# Patient Record
Sex: Female | Born: 1975
Health system: Southern US, Community
[De-identification: ages and names within clinical notes are randomized; demographics above are authoritative.]

## PROBLEM LIST (undated history)

## (undated) ENCOUNTER — Emergency Department (HOSPITAL_COMMUNITY): Discharge status: Against Medical Advice | Payer: BC Managed Care – PPO

## (undated) DIAGNOSIS — F329 Major depressive disorder, single episode, unspecified: Secondary | ICD-10-CM

## (undated) DIAGNOSIS — R87619 Unspecified abnormal cytological findings in specimens from cervix uteri: Secondary | ICD-10-CM

## (undated) DIAGNOSIS — N760 Acute vaginitis: Secondary | ICD-10-CM

## (undated) DIAGNOSIS — Z87820 Personal history of traumatic brain injury: Secondary | ICD-10-CM

## (undated) DIAGNOSIS — F988 Other specified behavioral and emotional disorders with onset usually occurring in childhood and adolescence: Secondary | ICD-10-CM

## (undated) DIAGNOSIS — M542 Cervicalgia: Secondary | ICD-10-CM

## (undated) DIAGNOSIS — N189 Chronic kidney disease, unspecified: Secondary | ICD-10-CM

## (undated) DIAGNOSIS — N200 Calculus of kidney: Secondary | ICD-10-CM

## (undated) DIAGNOSIS — J029 Acute pharyngitis, unspecified: Secondary | ICD-10-CM

## (undated) DIAGNOSIS — E039 Hypothyroidism, unspecified: Secondary | ICD-10-CM

## (undated) DIAGNOSIS — M1990 Unspecified osteoarthritis, unspecified site: Secondary | ICD-10-CM

## (undated) DIAGNOSIS — E079 Disorder of thyroid, unspecified: Secondary | ICD-10-CM

## (undated) DIAGNOSIS — E663 Overweight: Secondary | ICD-10-CM

## (undated) DIAGNOSIS — M79674 Pain in right toe(s): Secondary | ICD-10-CM

## (undated) DIAGNOSIS — R51 Headache: Secondary | ICD-10-CM

## (undated) DIAGNOSIS — E785 Hyperlipidemia, unspecified: Secondary | ICD-10-CM

## (undated) DIAGNOSIS — G4730 Sleep apnea, unspecified: Secondary | ICD-10-CM

## (undated) DIAGNOSIS — Z8669 Personal history of other diseases of the nervous system and sense organs: Secondary | ICD-10-CM

## (undated) DIAGNOSIS — T7840XA Allergy, unspecified, initial encounter: Secondary | ICD-10-CM

## (undated) DIAGNOSIS — N390 Urinary tract infection, site not specified: Secondary | ICD-10-CM

## (undated) DIAGNOSIS — Z8249 Family history of ischemic heart disease and other diseases of the circulatory system: Secondary | ICD-10-CM

## (undated) DIAGNOSIS — Z973 Presence of spectacles and contact lenses: Secondary | ICD-10-CM

## (undated) DIAGNOSIS — R6882 Decreased libido: Secondary | ICD-10-CM

## (undated) DIAGNOSIS — N3010 Interstitial cystitis (chronic) without hematuria: Secondary | ICD-10-CM

## (undated) DIAGNOSIS — R1011 Right upper quadrant pain: Principal | ICD-10-CM

## (undated) DIAGNOSIS — R591 Generalized enlarged lymph nodes: Secondary | ICD-10-CM

## (undated) DIAGNOSIS — I10 Essential (primary) hypertension: Secondary | ICD-10-CM

## (undated) DIAGNOSIS — J209 Acute bronchitis, unspecified: Secondary | ICD-10-CM

## (undated) DIAGNOSIS — Z87442 Personal history of urinary calculi: Secondary | ICD-10-CM

## (undated) DIAGNOSIS — R32 Unspecified urinary incontinence: Secondary | ICD-10-CM

## (undated) DIAGNOSIS — M797 Fibromyalgia: Secondary | ICD-10-CM

## (undated) DIAGNOSIS — K219 Gastro-esophageal reflux disease without esophagitis: Secondary | ICD-10-CM

## (undated) DIAGNOSIS — D649 Anemia, unspecified: Secondary | ICD-10-CM

## (undated) DIAGNOSIS — N761 Subacute and chronic vaginitis: Secondary | ICD-10-CM

## (undated) DIAGNOSIS — B009 Herpesviral infection, unspecified: Secondary | ICD-10-CM

## (undated) DIAGNOSIS — R29898 Other symptoms and signs involving the musculoskeletal system: Secondary | ICD-10-CM

## (undated) DIAGNOSIS — G43909 Migraine, unspecified, not intractable, without status migrainosus: Secondary | ICD-10-CM

## (undated) DIAGNOSIS — G43601 Persistent migraine aura with cerebral infarction, not intractable, with status migrainosus: Secondary | ICD-10-CM

## (undated) DIAGNOSIS — J189 Pneumonia, unspecified organism: Secondary | ICD-10-CM

## (undated) DIAGNOSIS — M25512 Pain in left shoulder: Secondary | ICD-10-CM

## (undated) DIAGNOSIS — Z0000 Encounter for general adult medical examination without abnormal findings: Secondary | ICD-10-CM

## (undated) DIAGNOSIS — I639 Cerebral infarction, unspecified: Secondary | ICD-10-CM

## (undated) DIAGNOSIS — R41840 Attention and concentration deficit: Secondary | ICD-10-CM

## (undated) DIAGNOSIS — W57XXXA Bitten or stung by nonvenomous insect and other nonvenomous arthropods, initial encounter: Secondary | ICD-10-CM

## (undated) DIAGNOSIS — N8110 Cystocele, unspecified: Secondary | ICD-10-CM

## (undated) DIAGNOSIS — B019 Varicella without complication: Secondary | ICD-10-CM

## (undated) DIAGNOSIS — R0789 Other chest pain: Secondary | ICD-10-CM

## (undated) HISTORY — DX: Personal history of other diseases of the nervous system and sense organs: Z86.69

## (undated) HISTORY — DX: Essential (primary) hypertension: I10

## (undated) HISTORY — DX: Fibromyalgia: M79.7

## (undated) HISTORY — DX: Interstitial cystitis (chronic) without hematuria: N30.10

## (undated) HISTORY — DX: Persistent migraine aura with cerebral infarction, not intractable, with status migrainosus: G43.601

## (undated) HISTORY — DX: Allergy, unspecified, initial encounter: T78.40XA

## (undated) HISTORY — DX: Cystocele, unspecified: N81.10

## (undated) HISTORY — DX: Encounter for general adult medical examination without abnormal findings: Z00.00

## (undated) HISTORY — DX: Personal history of traumatic brain injury: Z87.820

## (undated) HISTORY — DX: Subacute and chronic vaginitis: N76.1

## (undated) HISTORY — DX: Varicella without complication: B01.9

## (undated) HISTORY — DX: Sleep apnea, unspecified: G47.30

## (undated) HISTORY — DX: Calculus of kidney: N20.0

## (undated) HISTORY — DX: Chronic kidney disease, unspecified: N18.9

## (undated) HISTORY — DX: Urinary tract infection, site not specified: N39.0

## (undated) HISTORY — DX: Herpesviral infection, unspecified: B00.9

## (undated) HISTORY — DX: Major depressive disorder, single episode, unspecified: F32.9

## (undated) HISTORY — DX: Cervicalgia: M54.2

## (undated) HISTORY — DX: Other symptoms and signs involving the musculoskeletal system: R29.898

## (undated) HISTORY — DX: Headache: R51

## (undated) HISTORY — DX: Acute vaginitis: N76.0

## (undated) HISTORY — DX: Attention and concentration deficit: R41.840

## (undated) HISTORY — DX: Overweight: E66.3

## (undated) HISTORY — DX: Other chest pain: R07.89

## (undated) HISTORY — DX: Pain in right toe(s): M79.674

## (undated) HISTORY — DX: Right upper quadrant pain: R10.11

## (undated) HISTORY — DX: Generalized enlarged lymph nodes: R59.1

## (undated) HISTORY — DX: Other specified behavioral and emotional disorders with onset usually occurring in childhood and adolescence: F98.8

## (undated) HISTORY — DX: Cerebral infarction, unspecified: I63.9

## (undated) HISTORY — DX: Hyperlipidemia, unspecified: E78.5

## (undated) HISTORY — DX: Acute pharyngitis, unspecified: J02.9

## (undated) HISTORY — DX: Bitten or stung by nonvenomous insect and other nonvenomous arthropods, initial encounter: W57.XXXA

## (undated) HISTORY — DX: Pain in left shoulder: M25.512

## (undated) HISTORY — DX: Anemia, unspecified: D64.9

## (undated) HISTORY — DX: Decreased libido: R68.82

## (undated) HISTORY — DX: Gastro-esophageal reflux disease without esophagitis: K21.9

## (undated) HISTORY — DX: Unspecified abnormal cytological findings in specimens from cervix uteri: R87.619

## (undated) HISTORY — DX: Disorder of thyroid, unspecified: E07.9

## (undated) HISTORY — DX: Migraine, unspecified, not intractable, without status migrainosus: G43.909

## (undated) HISTORY — DX: Acute bronchitis, unspecified: J20.9

## (undated) HISTORY — PX: ANTERIOR AND POSTERIOR REPAIR: SHX1172

## (undated) HISTORY — DX: Family history of ischemic heart disease and other diseases of the circulatory system: Z82.49

---

## 1998-01-26 ENCOUNTER — Other Ambulatory Visit: Admission: RE | Admit: 1998-01-26 | Discharge: 1998-01-26 | Payer: Self-pay | Admitting: Gynecology

## 1998-11-01 ENCOUNTER — Other Ambulatory Visit: Admission: RE | Admit: 1998-11-01 | Discharge: 1998-11-01 | Payer: Self-pay | Admitting: Gynecology

## 1999-07-01 ENCOUNTER — Other Ambulatory Visit: Admission: RE | Admit: 1999-07-01 | Discharge: 1999-07-01 | Payer: Self-pay | Admitting: Gynecology

## 1999-12-04 ENCOUNTER — Inpatient Hospital Stay (HOSPITAL_COMMUNITY): Admission: AD | Admit: 1999-12-04 | Discharge: 1999-12-04 | Payer: Self-pay | Admitting: Gynecology

## 1999-12-23 ENCOUNTER — Inpatient Hospital Stay (HOSPITAL_COMMUNITY): Admission: AD | Admit: 1999-12-23 | Discharge: 1999-12-23 | Payer: Self-pay | Admitting: *Deleted

## 1999-12-27 ENCOUNTER — Inpatient Hospital Stay (HOSPITAL_COMMUNITY): Admission: AD | Admit: 1999-12-27 | Discharge: 1999-12-29 | Payer: Self-pay | Admitting: *Deleted

## 2000-01-14 DIAGNOSIS — Z8659 Personal history of other mental and behavioral disorders: Secondary | ICD-10-CM

## 2000-01-14 DIAGNOSIS — F32A Depression, unspecified: Secondary | ICD-10-CM

## 2000-01-14 HISTORY — DX: Depression, unspecified: F32.A

## 2000-01-14 HISTORY — DX: Personal history of other mental and behavioral disorders: Z86.59

## 2000-02-10 ENCOUNTER — Other Ambulatory Visit: Admission: RE | Admit: 2000-02-10 | Discharge: 2000-02-10 | Payer: Self-pay | Admitting: Gynecology

## 2000-02-14 ENCOUNTER — Encounter: Admission: RE | Admit: 2000-02-14 | Discharge: 2000-03-15 | Payer: Self-pay | Admitting: Gynecology

## 2000-08-27 ENCOUNTER — Encounter: Payer: Self-pay | Admitting: Urology

## 2000-08-27 ENCOUNTER — Encounter: Admission: RE | Admit: 2000-08-27 | Discharge: 2000-08-27 | Payer: Self-pay | Admitting: Urology

## 2001-01-13 HISTORY — PX: INCONTINENCE SURGERY: SHX676

## 2001-01-13 HISTORY — PX: ANTERIOR AND POSTERIOR REPAIR: SHX1172

## 2001-02-11 ENCOUNTER — Other Ambulatory Visit: Admission: RE | Admit: 2001-02-11 | Discharge: 2001-02-11 | Payer: Self-pay

## 2001-06-02 ENCOUNTER — Encounter: Payer: Self-pay | Admitting: Gynecology

## 2001-06-03 ENCOUNTER — Inpatient Hospital Stay (HOSPITAL_COMMUNITY): Admission: RE | Admit: 2001-06-03 | Discharge: 2001-06-04 | Payer: Self-pay | Admitting: Gynecology

## 2001-06-10 ENCOUNTER — Observation Stay (HOSPITAL_COMMUNITY): Admission: EM | Admit: 2001-06-10 | Discharge: 2001-06-11 | Payer: Self-pay | Admitting: Emergency Medicine

## 2001-06-10 ENCOUNTER — Encounter: Payer: Self-pay | Admitting: Emergency Medicine

## 2001-12-02 ENCOUNTER — Encounter: Payer: Self-pay | Admitting: Family Medicine

## 2001-12-02 ENCOUNTER — Encounter: Admission: RE | Admit: 2001-12-02 | Discharge: 2001-12-02 | Payer: Self-pay | Admitting: Family Medicine

## 2001-12-04 ENCOUNTER — Encounter: Payer: Self-pay | Admitting: Emergency Medicine

## 2001-12-04 ENCOUNTER — Emergency Department (HOSPITAL_COMMUNITY): Admission: EM | Admit: 2001-12-04 | Discharge: 2001-12-04 | Payer: Self-pay | Admitting: Emergency Medicine

## 2002-02-15 ENCOUNTER — Other Ambulatory Visit: Admission: RE | Admit: 2002-02-15 | Discharge: 2002-02-15 | Payer: Self-pay | Admitting: Gynecology

## 2003-06-01 ENCOUNTER — Other Ambulatory Visit: Admission: RE | Admit: 2003-06-01 | Discharge: 2003-06-01 | Payer: Self-pay | Admitting: Gynecology

## 2003-07-06 ENCOUNTER — Encounter: Admission: RE | Admit: 2003-07-06 | Discharge: 2003-07-06 | Payer: Self-pay | Admitting: General Surgery

## 2003-08-26 ENCOUNTER — Inpatient Hospital Stay (HOSPITAL_COMMUNITY): Admission: AD | Admit: 2003-08-26 | Discharge: 2003-08-26 | Payer: Self-pay | Admitting: Gynecology

## 2003-10-05 ENCOUNTER — Ambulatory Visit (HOSPITAL_COMMUNITY): Admission: RE | Admit: 2003-10-05 | Discharge: 2003-10-05 | Payer: Self-pay | Admitting: Urology

## 2003-10-05 ENCOUNTER — Encounter (INDEPENDENT_AMBULATORY_CARE_PROVIDER_SITE_OTHER): Payer: Self-pay | Admitting: Specialist

## 2003-10-05 ENCOUNTER — Ambulatory Visit (HOSPITAL_BASED_OUTPATIENT_CLINIC_OR_DEPARTMENT_OTHER): Admission: RE | Admit: 2003-10-05 | Discharge: 2003-10-05 | Payer: Self-pay | Admitting: Urology

## 2003-12-21 ENCOUNTER — Encounter: Admission: RE | Admit: 2003-12-21 | Discharge: 2003-12-21 | Payer: Self-pay | Admitting: General Surgery

## 2004-05-07 ENCOUNTER — Other Ambulatory Visit: Admission: RE | Admit: 2004-05-07 | Discharge: 2004-05-07 | Payer: Self-pay | Admitting: Family Medicine

## 2004-08-05 ENCOUNTER — Encounter: Admission: RE | Admit: 2004-08-05 | Discharge: 2004-08-05 | Payer: Self-pay | Admitting: Family Medicine

## 2004-08-21 ENCOUNTER — Other Ambulatory Visit: Admission: RE | Admit: 2004-08-21 | Discharge: 2004-08-21 | Payer: Self-pay | Admitting: Gynecology

## 2005-03-08 ENCOUNTER — Inpatient Hospital Stay (HOSPITAL_COMMUNITY): Admission: AD | Admit: 2005-03-08 | Discharge: 2005-03-08 | Payer: Self-pay | Admitting: Gynecology

## 2005-03-11 ENCOUNTER — Other Ambulatory Visit: Admission: RE | Admit: 2005-03-11 | Discharge: 2005-03-11 | Payer: Self-pay | Admitting: Gynecology

## 2005-09-02 ENCOUNTER — Encounter: Admission: RE | Admit: 2005-09-02 | Discharge: 2005-09-02 | Payer: Self-pay | Admitting: *Deleted

## 2005-09-22 ENCOUNTER — Emergency Department (HOSPITAL_COMMUNITY): Admission: EM | Admit: 2005-09-22 | Discharge: 2005-09-22 | Payer: Self-pay | Admitting: *Deleted

## 2005-12-12 ENCOUNTER — Ambulatory Visit (HOSPITAL_COMMUNITY): Admission: RE | Admit: 2005-12-12 | Discharge: 2005-12-12 | Payer: Self-pay | Admitting: Gastroenterology

## 2006-08-20 ENCOUNTER — Encounter: Admission: RE | Admit: 2006-08-20 | Discharge: 2006-08-20 | Payer: Self-pay | Admitting: *Deleted

## 2007-01-03 ENCOUNTER — Encounter: Admission: RE | Admit: 2007-01-03 | Discharge: 2007-01-03 | Payer: Self-pay | Admitting: Specialist

## 2007-02-12 ENCOUNTER — Encounter: Admission: RE | Admit: 2007-02-12 | Discharge: 2007-02-12 | Payer: Self-pay | Admitting: Specialist

## 2007-02-26 ENCOUNTER — Encounter: Admission: RE | Admit: 2007-02-26 | Discharge: 2007-02-26 | Payer: Self-pay | Admitting: Family Medicine

## 2007-04-06 ENCOUNTER — Encounter: Admission: RE | Admit: 2007-04-06 | Discharge: 2007-04-06 | Payer: Self-pay | Admitting: Allergy and Immunology

## 2007-05-13 ENCOUNTER — Encounter: Admission: RE | Admit: 2007-05-13 | Discharge: 2007-05-13 | Payer: Self-pay | Admitting: Allergy and Immunology

## 2007-10-10 ENCOUNTER — Emergency Department (HOSPITAL_COMMUNITY): Admission: EM | Admit: 2007-10-10 | Discharge: 2007-10-10 | Payer: Self-pay | Admitting: Emergency Medicine

## 2008-07-17 ENCOUNTER — Inpatient Hospital Stay (HOSPITAL_COMMUNITY): Admission: AD | Admit: 2008-07-17 | Discharge: 2008-07-17 | Payer: Self-pay | Admitting: Obstetrics and Gynecology

## 2010-05-31 NOTE — Discharge Summary (Signed)
[facility]  Patient:    Kristen [name], Kristen [name]                             MRN: [id number] Adm. Date:  [id number] Disc. Date: [id number] Attending:  Wetzel Bjornstad Dictator:   Antony Contras, [facility]                           Discharge Summary  DISCHARGE DIAGNOSES:          1. Intrauterine pregnancy at 38 weeks.                               2. History of recurrent urinary tract                                  infections during pregnancy on suppressive                                  therapy.                               3. Positive Group B Streptococci status.  PROCEDURES:                   Normal spontaneous vaginal delivery of                               viable infant over intact perineum with a                               first-degree laceration.  HISTORY OF PRESENT ILLNESS:   Patient is a 35 year old gravida 2, para 1-0-0-1 with LMP April 07, 1999, [facility] January 12, 2000. Prenatal risk factors include a history of recurrent urinary tract infections for which patient was placed on suppressive Macrobid therapy. Patient is also positive for GBS.  LABORATORY DATA:              Blood type A positive, antibody screen negative. RPR, HBSAG, and HIV nonreactive. Rubella immune. Patient declined MSAFP. GBS status is positive.  HOSPITAL COURSE/TREATMENT:     Patient was admitted on December 27, 1999 with advanced cervical dilatation, 8 cm. She progressed rapidly to complete dilatation and delivered an Apgar 8/9 female infant weighing 6 pounds 5 ounces over intact perineum with a first-degree laceration.  POSTPARTUM COURSE:            She remained afebrile, had no difficulty voiding, and was able to be discharged on her second postpartum day in satisfactory condition. CBC: hematocrit was 30.3, hemoglobin 10.8. Platelets 131.  DISPOSITION:                  Follow up in six weeks, continue prenatal vitamins and iron, Motrin/Tylox for pain. DD:  01/27/00 TD:   01/27/00 Job: [id number]

## 2010-05-31 NOTE — H&P (Signed)
[facility]  Patient:    Kristen [name], Kristen [name] Visit Number: [id number] MRN: [id number]          Service Type: SUR Location: 3W 0372 01 Attending Physician:  Merrily Pew Dictated by:   Nadyne Coombes Fontaine, M.D. Admit Date:  06/02/2001                           History and Physical  CHIEF COMPLAINT:  Urinary incontinence, pelvic pressure, rectal complaints.  HISTORY OF PRESENT ILLNESS:  The patient is a 35 year old G2, P2 female with history of urinary incontinence, pelvic pressure, protrusion from the vagina with standing, and rectal pressure and digital rectal manipulation for bowel movement, found to have urinary incontinence consistent with stress urinary incontinence, cystocele, rectocele, for planned sling procedure, anterior and posterior repair.  The patient is admitted at this time for a combined procedure with Dr. Isabel Caprice, who will perform the cystocele, anterior repair, and I will perform the posterior repair.  PAST MEDICAL HISTORY: 1. Hypothyroidism. 2. Anxiety and depression. 3. Asthma.  PAST SURGICAL HISTORY:  None.  ALLERGIES:  None.  MEDICATIONS: 1. Synthroid 0.5 mg. 2. Lexapro 10 q.d. 3. Advair. 4. Albuterol p.r.n., currently not using.  REVIEW OF SYSTEMS:  Noncontributory.  SOCIAL HISTORY:  Noncontributory.  FAMILY HISTORY:  Noncontributory.  PHYSICAL EXAMINATION:  VITAL SIGNS:  Afebrile, vital signs are stable.  HEENT:  Normal.  LUNGS:  Clear.  CARDIAC:  Regular rate.  No rubs, murmurs, or gallops.  ABDOMEN:  Benign.  PELVIC:  External BUS, vagina, second-degree cystocele with straining, second-degree rectocele.  Cervix grossly normal.  Uterus normal in size, good support.  Adnexa without masses or tenderness.  RECTOVAGINAL:  Good anal sphincter tone.  Confirms rectocele.  ASSESSMENT:  The patient is a 35 year old gravida 2, para 2 female with urinary incontinence, pelvic relaxation with cystocele,  rectocele, who desires surgical repair.  The patient desires potential for future childbearing and does not want associated hysterectomy.  The patient understands that she is undergoing a combined procedure with Dr. Isabel Caprice, that he will perform the cystocele, sling procedure and that I will do the rectocele repair.  I reviewed with the patient the expected procedure, the intraoperative and postoperative courses.  She understands that Dr. Isabel Caprice is in charge of her bladder surgery and her postoperative bladder care and that I will be responsible for the rectocele repair.  The risks of the procedure were discussed with her in detail.  I reviewed with her that the procedure may not work, that she may continue to have some pelvic discomfort and feeling like something is there as well as pain with intercourse.  I reviewed with her that we are putting incisions in the vagina and that routinely we counsel patients that there may be persistent dyspareunia following the procedure as a result directly of the procedure, and she understands and accepts this.  The risks of infection requiring prolonged antibiotics as well as abscess formation requiring opening and draining of surgical sites were discussed with her.  The risks of bleeding leading to hemorrhage were reviewed.  The patient is a Curator.  She would refuse any transfusion or any blood products even if this would lead to her death.  Her family feels the same way, and she is going to provide paperwork that absolves myself and the hospital from any litigation resulting from her death from blood loss.  The risks of inadvertent injury to surrounding structures  and internal structures were reviewed to include the most immediate structures of her bladder and rectum requiring more extensive reparative surgeries as well as future reparative surgeries and intra-abdominal reparative surgeries such as other intestinal injury and ostomy  formation.  The issues of continence rectally were also discussed.  She understands that we are working the area and that this could lead to rectal incontinence.  I also discussed with her that there are no guarantees as far as relief of her symptoms but, certainly, given that she does have a rectocele and her feelings, as far as her discomfort, appear to be associated with this to include stool trapping, I anticipate that she will have some relief.  The patients questions were answered to her satisfaction, and she is ready to proceed with surgery. Dictated by:   Nadyne Coombes. Fontaine, M.D. Attending Physician:  Merrily Pew DD:  06/01/01 TD:  06/02/01 Job: [id number] [id number]

## 2010-05-31 NOTE — Op Note (Signed)
[facility]  Patient:    Kristen [name], Kristen [name] Visit Number: [id number] MRN: [id number]          Service Type: SUR Location: 3W 0372 01 Attending Physician:  Merrily Pew Dictated by:   Nadyne Coombes Fontaine, M.D. Proc. Date: 06/02/01 Admit Date:  06/02/2001 Discharge Date: 06/04/2001                             Operative Report  PREOPERATIVE DIAGNOSES: 1. Stress urinary incontinence. 2. Cystocele. 3. Rectocele.  POSTOPERATIVE DIAGNOSES: 1. Stress urinary incontinence. 2. Cystocele. 3. Rectocele.  PROCEDURE:  Posterior colporrhaphy.  SURGEON:  Timothy P. Fontaine, M.D.  ASSISTANT:  Barron Alvine, M.D.  ESTIMATED BLOOD LOSS:  Total surgery 750 cc.  COMPLICATIONS:  None.  ANESTHESIA:  General endotracheal.  SPECIMEN:  None.  FINDINGS:  Pelvic exam consistent with second degree cystocele, second degree rectocele.  DESCRIPTION OF PROCEDURE:  The patient was taken to the operating room and underwent general endotracheal anesthesia. She was placed in the low dorsal lithotomy position. She received an abdominal/perineal/vaginal preparation with Betadine scrub and Betadine solution and the patient was draped in the usual fashion. Dr. Isabel Caprice performed the first half of the procedure to include the cystocele repair and pubovaginal sling, for which he will have a separate dictation. There was noted a significant amount of bleeding during this portion of the case to account for approximately 700 cc blood loss. At the end of his portion of the procedure, the patient was placed in the high dorsal lithotomy position and the posterior fourchette was visualized with Allis clamps and a a transverse perineal incision was made. The vaginal mucosa was then undermined and tagged with Allis clamps to approximately two fingerbreadths below the cervix. The perirectal tissues were then sharply and bluntly dissected from the vaginal mucosa to expose the  perirectal spaces bilaterally. Using 2-0 Vicryl in interrupted suture, the perirectal tissues were reapproximated with reduction of the rectocele in multiple sutures. The excess vaginal mucosa was then excised and the vaginal mucosa was then reapproximated using 2-0 Vicryl in a running interlocking stitch. There was adequate hemostasis visualized at both surgical sites at the end of the case. Vaginal packing with antibiotic cream was then placed. The patient was then placed in a supine position, awakened without difficulty and taken to the recovery room in good condition. Dictated by:   Nadyne Coombes. Fontaine, M.D. Attending Physician:  Merrily Pew DD:  06/02/01 TD:  06/04/01 Job: [phone]

## 2010-05-31 NOTE — Op Note (Signed)
[facility]  Patient:    Kristen [name], Kristen [name] Visit Number: [id number] MRN: [id number]          Service Type: SUR Location: 3W 0372 01 Attending Physician:  Merrily Pew Dictated by:   Barron Alvine, M.D. Admit Date:  06/02/2001 Discharge Date: 06/04/2001   CC:         Timothy P. Fontaine, M.D.   Operative Report  PREOPERATIVE DIAGNOSES: 1. Cystocele. 2. Stress urinary incontinence. 3. Rectocele.  POSTOPERATIVE DIAGNOSES: 1. Cystocele. 2. Stress urinary incontinence. 3. Rectocele.  PROCEDURE PERFORMED:  Anterior repair with pubovaginal sling, suprapubic tube placement, and flexible cystoscopy by Dr. Isabel Caprice, and rectocele repair by Dr. Audie Box, dictated separately.  SURGEON:  Barron Alvine, M.D.  ASSISTANT:  Nadyne Coombes. Fontaine, M.D.  INDICATIONS:  The patient has been evaluated several times in our office.  She has been diagnosed with approximately a grade 2 cystocele with a rectocele, and evidence of urethral hypermobility.  She has complaints of stress urinary incontinence.  We had the patient evaluated by her gynecologist, Dr. Audie Box. The prolapse was symptomatic to her and Dr. Audie Box had recommended strong consideration for repair.  Because she already had mild stress incontinence, we felt that concurrent pubovaginal sling was certainly indicated as well.  We spent considerable time with the patient discussing the advantages and disadvantages of this.  We talked about potential risks including bleeding, urinary tract injury, infection, as well as some voiding dysfunction, or prolonged urinary retention.  The patient appeared to understand the advantages and disadvantages of this surgery, and full informed consent was obtained.  TECHNIQUE AND FINDINGS:  The patient was brought to the operating room where she had successful induction of general anesthesia.  She was placed in the mid lithotomy position, prepped and draped in the  usual manner.  A Foley catheter was used to drain the bladder, and a weighted vaginal speculum was utilized. With full relaxation, a grade 2 cystocele was appreciated.  The anterior mucosa was infiltrated with some Marcaine and the incision was made from the mid urethra back towards the cervix.  The plains were easily established and the vaginal wall was dissected free of the cystocele.  There was a moderate amount of oozing.  The retropubic spaces were entered bilaterally and some ooze of blood did come down from the right retropubic space.  The cystocele itself was reduced with several horizontal mattress sutures of 2-0 Vicryl.  Once the cystocele repair had been completed, we made our pubovaginal sling with a 2.5 x 7 cm piece of cadaveric fascia lata.  This was anchored with #1 nylon suture on both ends.  A small suprapubic incision was then made. Utilizing direct digital finger control within the retropubic space, we were able to pass a clamp behind the pubic symphysis and out the right-sided vaginal incision where the suture was grabbed.  The same thing was done on the contralateral side, and the sling was then positioned, and packed in an open position.  Cystoscopy confirmed the sling to be in good position without evidence of bladder injury, and blue dye could be seen from both orifices.  A suprapubic tube was placed with direct visual guidance.  Because of a small amount of oozing from the retropubic space, we went ahead and put a piece of Surgicel in the retropubic space through the vaginal incision.  The cystocele was nicely reduced.  A small, but appropriate amount of redundant vaginal mucosa was then trimmed.  The vaginal incision was  closed with a running 2-0 Vicryl suture.  Blood loss for this portion of the procedure was approximately 600-700 cc.  The suprapubic wound was irrigated.  Some lidocaine was utilized. Subcutaneous tissues were reapproximated and the skin was  closed with clips after tying the sling loosely over two fingers.  The suprapubic tube was left to gravity drainage and the Foley catheter was removed.  Dr. Audie Box then performed rectocele repair which we assisted in.  That portion of the procedure will be dictated separately.  All sponge and needle counts were correct.  The patient remained stable during the procedure and she was brought to the recovery room in stable condition. Dictated by:   Barron Alvine, M.D. Attending Physician:  Merrily Pew DD:  06/02/01 TD:  06/05/01 Job: [id number]

## 2010-05-31 NOTE — H&P (Signed)
[facility]  Patient:    Kristen [name], Kristen [name] Visit Number: [id number] MRN: [id number]          Service Type: SUR Location: 3W 0351 01 Attending Physician:  Thermon Leyland Dictated by:   Barron Alvine, M.D. Admit Date:  06/10/2001   CC:         Talmadge Coventry, M.D.  Timothy P. Fontaine, M.D.   History and Physical  CHIEF COMPLAINT:  Bladder spasms/lower supraclavicular abdominal pain.  HISTORY OF PRESENT ILLNESS:  The patient is a 35 year old female.  She recently underwent a combined anterior and posterior repair with pubovaginal sling.  Postoperatively she did well other than having some intermittent bladder spasms.  She also had some poor drainage of her suprapubic tube.  She was discharged home on postoperative day #2.  She started voiding shortly thereafter and was voiding pretty well, with minimal postvoid residual.  She was having some intermittent bladder spasms and discomfort but otherwise was doing pretty well.  Yesterday she came in our office for routine staple removal of suprapubic tube removal.  With removal of her suprapubic tube she had what appeared to be a severe bladder spasm and had to be heavily medicated and observed in our office for over three hours because the pain persisted. Eventually the pain resolved, and she was able to go home.  Our nurses called her on several occasions and were told that she was doing better.  She went to bed feeling okay but awoke this morning at 2 a.m. with intense pain in her lower abdomen.  She felt that it was similar to when the suprapubic tube was removed, and she describes it as a spasm.  She then had just a tingling feeling all over and continued to have severe discomfort.  When it did not resolve, she presented to the emergency room here at [facility].  No laboratories were done.  Her vital signs were all normal, and her abdomen was fairly benign.  A CT scan was ordered.  This showed no  hydronephrosis.  There was some postoperative surgical change and some questionable fluid in her abdomen, question of some ascites.  For that reason I asked them to draw blood, which was not done at this point.  We also felt that, given what could be potentially urine in her peritoneal cavity, it was important to do a cystogram.  A Foley catheter was placed, and a standard cystogram was performed.  This showed a smooth bladder without evidence of extravasation or other abnormalities.  The patient feels much better but is nervous about going home given what has transpired.  PAST MEDICAL HISTORY:  Otherwise relatively unremarkable.  She has no other major illnesses.  MEDICATIONS:  She is on Synthroid and has been on some Vicodin and Macrodantin.  ALLERGIES:  No drug allergies.  PHYSICAL EXAMINATION:  GENERAL:  Well-developed, well-nourished female.  She is a little bit upset and appears anxious but does not appear to be in any acute distress and is certainly nontoxic.  VITAL SIGNS:  Afebrile, normal vital signs.  ABDOMEN:  Is actually soft.  Her incision looks good, without induration or evidence of infection.  Her abdomen really is not tender and, again, is very soft, without obvious fullness.  LABORATORY DATA:  Basic laboratory studies are pending.  ASSESSMENT:  Nonspecific lower abdominal discomfort and what sounds like bladder spasms status post sling surgery.  She reports that she has really been voiding quite well but had been having  these intermittent spasms.  This was not unexpected after removal of her suprapubic tube although the duration and the extent of her spasm was certainly much more than we ever see.  We thought she would do better but, then again, had what sounds like a bladder spasm last evening.  These spasms tend to be very intense in the patient and seem to be of longer duration.  We have agreed to go ahead and admit her for 23 hours to observe her.  We  will leave her Foley indwelling for 23 hours and remove it early tomorrow morning to resume a voiding trial.  We will observe her to be sure that she does okay with voiding and does not have any obvious recurrent spasms.  In the interval, will monitor her with antispasmodics and give her pain medicine and B & O as necessary.  Will change her antibiotics to Levaquin and be sure that laboratory studies are, indeed, done.  Otherwise, there does not appear to be any major problems with the surgery and no obvious complications identified. Dictated by:   Barron Alvine, M.D. Attending Physician:  Thermon Leyland DD:  06/10/01 TD:  06/11/01 Job: [id number]

## 2010-05-31 NOTE — Op Note (Signed)
NAMERozelia, [name]                    ACCOUNT NO.:  [id number]   MEDICAL RECORD NO.:  [id number]          PATIENT TYPE:  AMB   LOCATION:  ENDO                         FACILITY:  [facility]   PHYSICIAN:  Anselmo Rod, M.D.  DATE OF BIRTH:  04-15-1975   DATE OF PROCEDURE:  12/12/2005  DATE OF DISCHARGE:                               OPERATIVE REPORT   PROCEDURE:  Colonoscopy.   ENDOSCOPIST:  Charna Elizabeth, M.D.   INSTRUMENT USED:  Olympus video colonoscope.   INDICATIONS FOR PROCEDURE:  35 year old white female with a history of  rectal bleeding and change in bowel habits to rule out IBD.   PREPROCEDURE PREPARATION:  Informed consent was obtained from the  patient.  The patient was fasted for eight hours prior to the procedure  and prepped with a gallon of Trilyte the night prior to the procedure.  The risks and benefits of the procedure including a 10% miss rate of  cancer and polyps were discussed with the patient, as well.   PREPROCEDURE PHYSICAL:  Patient with stable vital signs.  Neck supple.  Chest clear to auscultation.  S1 and S2 regular.  Abdomen soft with  normal bowel sounds.   DESCRIPTION OF PROCEDURE:  The patient was placed in the left lateral  decubitus position, sedated with 125 mcg fentanyl and 10 mg Versed given  intravenously in slow incremental doses.  Once the patient was  adequately sedated, maintained on low flow oxygen and continuous cardiac  monitoring, the Olympus video colonoscope was advanced from the rectum  to the cecum.  Multiple washes were done.  The appendiceal orifice and  ileocecal valve were clearly visualized and photographed.  No masses,  polyps, erosions, ulcerations, or diverticula were seen.  The terminal  ileum appeared healthy and without lesions.  Retroflexion in the rectum  revealed small internal hemorrhoids.  The patient tolerated the  procedure well without any complications.   IMPRESSION:  Small internal hemorrhoids seen on  retroflexion, otherwise,  normal exam to the terminal ileum.   RECOMMENDATIONS:  1. Continue high fiber diet with liberal fluid intake.  2. Repeat colonoscopy at the age 30 unless the patient develops any      abnormal symptoms in the interim.  She is to contact the office      immediately for further recommendations.  3. Outpatient follow up as need arises in the future.      Anselmo Rod, M.D.  Electronically Signed     JNM/MEDQ  D:  12/12/2005  T:  12/12/2005  Job:  [id number]   cc:   Talmadge Coventry, M.D.

## 2010-05-31 NOTE — Op Note (Signed)
Kristen [name], Kristen [name]                    ACCOUNT NO.:  [id number]   MEDICAL RECORD NO.:  [id number]          PATIENT TYPE:  AMB   LOCATION:  [facility]                         [facility]ACILITY[facility]   PHYSICIAN:  Maretta Bees. Vonita Moss, M.D.DATE OF BIRTH:  1975-04-30   DATE OF PROCEDURE:  10/05/2003  DATE OF DISCHARGE:                                 OPERATIVE REPORT   PREOPERATIVE DIAGNOSIS:  Rule out interstitial cystitis.   POSTOPERATIVE DIAGNOSIS:  Rule out interstitial cystitis.   PROCEDURES:  1.  Cystoscopy.  2.  Hydraulic overdistention of bladder.  3.  Cold cup bladder biopsies.   SURGEON:  Dr. Larey Dresser   ANESTHESIA:  General.   INDICATIONS:  This 35 year old female has had about an 8 week history of  suprapubic pressure, discomfort, and increased urge to void and nocturia,  and she had a CT scan of the abdomen and pelvis which were unremarkable.  There was suspicion of endometriosis but despite her short course of  symptoms, it is still concerning that she might have interstitial cystitis,  and she is brought to the OR today for further evaluation.   DESCRIPTION OF PROCEDURE:  The patient was brought to the operating room and  placed in lithotomy position.  External genitalia were prepped and draped in  the usual fashion.  She was cystoscoped, and the bladder was totally  unremarkable.  She started leaking around the cystoscope, and about 300-400  mL of irrigation with some urethral compression, and she was filled to 600  mL and then looking back in and observing the bladder, there was widespread  florid submucosal petechiae and hemorrhage.  Three typical hemorrhagic areas  across the bladder base were biopsied with the cold cup bladder biopsy  forceps and the biopsy sites fulgurated with the Bugbee electrode.  After  the bladder was emptied, 30 mL of 0.25% Marcaine was instilled in the  bladder.  A B&O suppository was inserted, and she was given 30 mg of Toradol  for postoperative  discomfort.     LJP/MEDQ  D:  10/05/2003  T:  10/05/2003  Job:  [id number]   cc:   Marcial Pacas P. Audie Box, M.D.  [address]  [place]  [place] [address]  Fax: [phone]

## 2010-09-24 ENCOUNTER — Encounter: Payer: Self-pay | Admitting: Family Medicine

## 2010-09-24 ENCOUNTER — Ambulatory Visit (INDEPENDENT_AMBULATORY_CARE_PROVIDER_SITE_OTHER): Payer: BC Managed Care – PPO | Admitting: Family Medicine

## 2010-09-24 DIAGNOSIS — N390 Urinary tract infection, site not specified: Secondary | ICD-10-CM

## 2010-09-24 DIAGNOSIS — J45909 Unspecified asthma, uncomplicated: Secondary | ICD-10-CM

## 2010-09-24 DIAGNOSIS — N760 Acute vaginitis: Secondary | ICD-10-CM

## 2010-09-24 DIAGNOSIS — I639 Cerebral infarction, unspecified: Secondary | ICD-10-CM

## 2010-09-24 DIAGNOSIS — IMO0001 Reserved for inherently not codable concepts without codable children: Secondary | ICD-10-CM

## 2010-09-24 DIAGNOSIS — F329 Major depressive disorder, single episode, unspecified: Secondary | ICD-10-CM

## 2010-09-24 DIAGNOSIS — I10 Essential (primary) hypertension: Secondary | ICD-10-CM

## 2010-09-24 DIAGNOSIS — D649 Anemia, unspecified: Secondary | ICD-10-CM

## 2010-09-24 DIAGNOSIS — M797 Fibromyalgia: Secondary | ICD-10-CM | POA: Insufficient documentation

## 2010-09-24 DIAGNOSIS — G43909 Migraine, unspecified, not intractable, without status migrainosus: Secondary | ICD-10-CM | POA: Insufficient documentation

## 2010-09-24 DIAGNOSIS — I160 Hypertensive urgency: Secondary | ICD-10-CM | POA: Insufficient documentation

## 2010-09-24 DIAGNOSIS — B009 Herpesviral infection, unspecified: Secondary | ICD-10-CM

## 2010-09-24 DIAGNOSIS — N200 Calculus of kidney: Secondary | ICD-10-CM | POA: Insufficient documentation

## 2010-09-24 DIAGNOSIS — N761 Subacute and chronic vaginitis: Secondary | ICD-10-CM

## 2010-09-24 DIAGNOSIS — Z8669 Personal history of other diseases of the nervous system and sense organs: Secondary | ICD-10-CM

## 2010-09-24 DIAGNOSIS — E785 Hyperlipidemia, unspecified: Secondary | ICD-10-CM

## 2010-09-24 DIAGNOSIS — Z87820 Personal history of traumatic brain injury: Secondary | ICD-10-CM

## 2010-09-24 DIAGNOSIS — N179 Acute kidney failure, unspecified: Secondary | ICD-10-CM

## 2010-09-24 DIAGNOSIS — J4520 Mild intermittent asthma, uncomplicated: Secondary | ICD-10-CM | POA: Insufficient documentation

## 2010-09-24 DIAGNOSIS — N8110 Cystocele, unspecified: Secondary | ICD-10-CM

## 2010-09-24 DIAGNOSIS — N8111 Cystocele, midline: Secondary | ICD-10-CM

## 2010-09-24 DIAGNOSIS — T7840XA Allergy, unspecified, initial encounter: Secondary | ICD-10-CM

## 2010-09-24 DIAGNOSIS — F3289 Other specified depressive episodes: Secondary | ICD-10-CM

## 2010-09-24 DIAGNOSIS — F32A Depression, unspecified: Secondary | ICD-10-CM

## 2010-09-24 DIAGNOSIS — M62838 Other muscle spasm: Secondary | ICD-10-CM

## 2010-09-24 DIAGNOSIS — S060XAA Concussion with loss of consciousness status unknown, initial encounter: Secondary | ICD-10-CM

## 2010-09-24 DIAGNOSIS — R599 Enlarged lymph nodes, unspecified: Secondary | ICD-10-CM

## 2010-09-24 DIAGNOSIS — G43601 Persistent migraine aura with cerebral infarction, not intractable, with status migrainosus: Secondary | ICD-10-CM

## 2010-09-24 DIAGNOSIS — R591 Generalized enlarged lymph nodes: Secondary | ICD-10-CM

## 2010-09-24 DIAGNOSIS — S060X9A Concussion with loss of consciousness of unspecified duration, initial encounter: Secondary | ICD-10-CM

## 2010-09-24 DIAGNOSIS — M791 Myalgia, unspecified site: Secondary | ICD-10-CM

## 2010-09-24 HISTORY — DX: Cerebral infarction, unspecified: G43.601

## 2010-09-24 HISTORY — DX: Herpesviral infection, unspecified: B00.9

## 2010-09-24 HISTORY — DX: Cystocele, unspecified: N81.10

## 2010-09-24 HISTORY — DX: Personal history of other diseases of the nervous system and sense organs: Z86.69

## 2010-09-24 HISTORY — DX: Generalized enlarged lymph nodes: R59.1

## 2010-09-24 HISTORY — DX: Essential (primary) hypertension: I10

## 2010-09-24 HISTORY — DX: Personal history of traumatic brain injury: Z87.820

## 2010-09-24 HISTORY — DX: Hypertensive urgency: I16.0

## 2010-09-24 HISTORY — DX: Subacute and chronic vaginitis: N76.1

## 2010-09-24 HISTORY — DX: Migraine, unspecified, not intractable, without status migrainosus: G43.909

## 2010-09-24 HISTORY — DX: Calculus of kidney: N20.0

## 2010-09-24 HISTORY — DX: Persistent migraine aura with cerebral infarction, not intractable, with status migrainosus: I63.9

## 2010-09-24 LAB — SEDIMENTATION RATE: Sed Rate: 9 mm/hr (ref 0–22)

## 2010-09-24 MED ORDER — AMPHETAMINE-DEXTROAMPHETAMINE 20 MG PO TABS: 20.0000 mg | ORAL_TABLET | Freq: Two times a day (BID) | ORAL | Status: DC

## 2010-09-24 MED ORDER — CARISOPRODOL 350 MG PO TABS: 350.0000 mg | ORAL_TABLET | Freq: Three times a day (TID) | ORAL | Status: DC | PRN

## 2010-09-24 MED ORDER — ALBUTEROL SULFATE HFA 108 (90 BASE) MCG/ACT IN AERS: 1.0000 | INHALATION_SPRAY | RESPIRATORY_TRACT | Status: DC | PRN

## 2010-09-24 MED ORDER — METOPROLOL SUCCINATE ER 25 MG PO TB24: 25.0000 mg | ORAL_TABLET | Freq: Every day | ORAL | Status: DC

## 2010-09-24 MED ORDER — BUDESONIDE-FORMOTEROL FUMARATE 160-4.5 MCG/ACT IN AERO: INHALATION_SPRAY | RESPIRATORY_TRACT | Status: DC

## 2010-09-24 NOTE — Assessment & Plan Note (Signed)
Uses Nystatin cream  Prn when it flares

## 2010-09-24 NOTE — Assessment & Plan Note (Signed)
Start Metoprolol XL 25mg daily

## 2010-09-24 NOTE — Assessment & Plan Note (Addendum)
Patient reports  It has historically been mild, will have her provide old records. Encouraged her to start Fish oil caps daily
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
## 2010-09-24 NOTE — Assessment & Plan Note (Signed)
Check a renal panel 

## 2010-09-24 NOTE — Assessment & Plan Note (Signed)
This has not recurred since her 35 year old was born. She feels this is well controlled
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
## 2010-09-24 NOTE — Assessment & Plan Note (Signed)
No recent concerns. 

## 2010-09-24 NOTE — Assessment & Plan Note (Signed)
Does use intermittent Cetirizine as her allergies flare, is not taking now
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
## 2010-09-24 NOTE — Patient Instructions (Signed)

## 2010-09-24 NOTE — Assessment & Plan Note (Signed)
Is well suppressed on bid Acyclovir
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
## 2010-09-24 NOTE — Assessment & Plan Note (Signed)
Has a seasonal/allergic component. In a good season she uses Symbicort 160/4.5  1 puff po daily and as symptoms worsen she titrates up to 2 puffs po bid

## 2010-09-25 LAB — CBC WITH DIFFERENTIAL/PLATELET
Eosinophils Absolute: 0.3 10*3/uL (ref 0.0–0.7)
Eosinophils Relative: 4.2 % (ref 0.0–5.0)
Hemoglobin: 13.7 g/dL (ref 12.0–15.0)
Lymphocytes Relative: 38.3 % (ref 12.0–46.0)
Lymphs Abs: 2.6 10*3/uL (ref 0.7–4.0)
MCHC: 33.1 g/dL (ref 30.0–36.0)
MCV: 94.5 fl (ref 78.0–100.0)
Monocytes Relative: 33.8 % — ABNORMAL HIGH (ref 3.0–12.0)
Neutro Abs: 1.4 10*3/uL (ref 1.4–7.7)
Platelets: 191 10*3/uL (ref 150.0–400.0)
RDW: 13 % (ref 11.5–14.6)
WBC: 6.9 10*3/uL (ref 4.5–10.5)

## 2010-09-25 LAB — RENAL FUNCTION PANEL
CO2: 29 mEq/L (ref 19–32)
Calcium: 9 mg/dL (ref 8.4–10.5)
Chloride: 103 mEq/L (ref 96–112)
Glucose, Bld: 85 mg/dL (ref 70–99)
Phosphorus: 3.4 mg/dL (ref 2.3–4.6)

## 2010-09-30 ENCOUNTER — Ambulatory Visit: Payer: BC Managed Care – PPO | Admitting: Family Medicine

## 2010-10-08 ENCOUNTER — Ambulatory Visit (INDEPENDENT_AMBULATORY_CARE_PROVIDER_SITE_OTHER): Payer: BC Managed Care – PPO | Admitting: Family Medicine

## 2010-10-08 ENCOUNTER — Encounter: Payer: Self-pay | Admitting: Family Medicine

## 2010-10-08 VITALS — BP 124/91 | HR 62 | Temp 98.3°F | Ht 62.75 in | Wt 141.1 lb

## 2010-10-08 DIAGNOSIS — R599 Enlarged lymph nodes, unspecified: Secondary | ICD-10-CM

## 2010-10-08 DIAGNOSIS — R0789 Other chest pain: Secondary | ICD-10-CM

## 2010-10-08 DIAGNOSIS — I10 Essential (primary) hypertension: Secondary | ICD-10-CM

## 2010-10-08 DIAGNOSIS — R591 Generalized enlarged lymph nodes: Secondary | ICD-10-CM

## 2010-10-08 HISTORY — DX: Other chest pain: R07.89

## 2010-10-08 LAB — CBC WITH DIFFERENTIAL/PLATELET
Basophils Absolute: 0 10*3/uL (ref 0.0–0.1)
Basophils Relative: 0.5 % (ref 0.0–3.0)
Eosinophils Absolute: 0.1 10*3/uL (ref 0.0–0.7)
Eosinophils Relative: 0.9 % (ref 0.0–5.0)
HCT: 40.3 % (ref 36.0–46.0)
Hemoglobin: 13.4 g/dL (ref 12.0–15.0)
Lymphocytes Relative: 38.2 % (ref 12.0–46.0)
Lymphs Abs: 3.7 10*3/uL (ref 0.7–4.0)
MCHC: 33.3 g/dL (ref 30.0–36.0)
MCV: 94.1 fl (ref 78.0–100.0)
Monocytes Absolute: 0.6 10*3/uL (ref 0.1–1.0)
Monocytes Relative: 5.8 % (ref 3.0–12.0)
Neutro Abs: 5.3 10*3/uL (ref 1.4–7.7)
Neutrophils Relative %: 54.6 % (ref 43.0–77.0)
Platelets: 208 10*3/uL (ref 150.0–400.0)
RBC: 4.28 Mil/uL (ref 3.87–5.11)
RDW: 12.8 % (ref 11.5–14.6)
WBC: 9.6 10*3/uL (ref 4.5–10.5)

## 2010-10-08 MED ORDER — NEBIVOLOL HCL 5 MG PO TABS: 5.0000 mg | ORAL_TABLET | Freq: Every day | ORAL | Status: DC

## 2010-10-08 NOTE — Progress Notes (Signed)
Kristen [name] [id number] 11-19-1975 10/08/2010      Progress Note-Follow Up  Subjective  Chief Complaint  Chief Complaint  Patient presents with  . Follow-up    2 week follow up    HPI  Patient is a peripheral Caucasian female who is in today for followup. At her last visit we started her on metoprolol for her hypertension. She notes excessive fatigue since starting it. Over the last 2 days that has improved slightly. She also knows to 3 hours after taking it she feels lightheaded and very weak. This may also be improving slightly she denies any other complaints no headache, palpitations. She does note increased albuterol use and shortness of breath since starting the metoprolol as well during no wheezing, fevers, chills, sputum production. The lymph nodes in bilateral axilla are still up left worse than right and still somewhat tender. No other breast changes or discharge noted. The patient lymph nodes that were enlarged and tender in her right leg are no longer tender. They're still somewhat enlarged but improving. No oral diaphoresis, GI or GU complaints noted at today's visit. She had an episode of sharp chest pain several days ago. She didn't denies doing anything out of the ordinary. The pain was sharp and she had trouble drawing breath but did not have any radiation of the pain diaphoresis or palpitations associated and has not recurred. 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 Thyroid disease   . Hypertension   . ADD (attention deficit disorder)   . Fibromyalgia   . Chicken pox as a child  . Allergy     seasonal  . Asthma     mild, intermittent  . Recurrent UTI   . Anemia     blood loss  . Hyperlipidemia   . Depression 2002    post partum  . Renal lithiasis 09/24/2010  . Concussion 09/24/2010  . History of concussion 09/24/2010  . 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 Female bladder  prolapse, acquired 09/24/2010  . HTN (hypertension) 09/24/2010  . Subacute and chronic vaginitis 09/24/2010  . Lymphadenopathy 09/24/2010  . Migraine aura, persistent, with cerebral infarct, status over 72 hours 09/24/2010  . History of migraines 09/24/2010  . Chest pain, atypical 10/08/2010    Past Surgical History  Procedure Date  . Incontinence surgery 2003    Family History  Problem Relation Age of Onset  . Heart attack Father   . Hyperlipidemia Father   . Hypertension Father   . Alcohol abuse Father   . Heart disease Father   . Heart attack Brother 45  . Hypertension Brother   . Heart disease Brother 45    MI  . Dementia Maternal Grandfather 80    early stages  . Heart attack Paternal Grandfather   . Stroke Paternal Grandfather   . Heart disease Paternal Grandfather   . Alcohol abuse Paternal Grandfather   . Hyperlipidemia Paternal Grandfather   . Hypertension Paternal Grandfather     History   Social History  . Marital Status: Married    Spouse Name: N/A    Number of Children: N/A  . Years of Education: N/A   Occupational History  . Not on file.   Social History Main Topics  . Smoking status: Never Smoker   . Smokeless tobacco: Never Used  . Alcohol Use: Yes     3 drinks weekly  .  Drug Use: No  . Sexually Active: Yes -- Female partner(s)   Other Topics Concern  . Not on file   Social History Narrative  . No narrative on file    Current Outpatient Prescriptions on File Prior to Visit  Medication Sig Dispense Refill  . acyclovir (ZOVIRAX) 400 MG tablet Take 400 mg by mouth 2 (two) times daily.        Marland Kitchen albuterol (PROVENTIL HFA;VENTOLIN HFA) 108 (90 BASE) MCG/ACT inhaler Inhale 1 puff into the lungs every 4 (four) hours as needed for wheezing. 1-2 puffs po bid prn asthma, as directed by pulmonolgy  1 Inhaler  0  . amphetamine-dextroamphetamine (ADDERALL) 20 MG tablet Take 1 tablet (20 mg total) by mouth 2 (two) times daily.  60 tablet  0  .  budesonide-formoterol (SYMBICORT) 160-4.5 MCG/ACT inhaler 1-2 puffs po bid prn asthma flare as directed by pulmonology  1 Inhaler  0  . HYDROcodone-acetaminophen (VICODIN) 5-500 MG per tablet       . levothyroxine (SYNTHROID, LEVOTHROID) 125 MCG tablet Take 125 mcg by mouth as directed. Alternate with       . levothyroxine (SYNTHROID, LEVOTHROID) 137 MCG tablet Take 137 mcg by mouth as directed. Alternate with 125 mcg       . Norethin-Eth Estrad-Fe Biphas (LO LOESTRIN FE PO) Take 1 tablet by mouth daily.          No Known Allergies  Review of Systems  Review of Systems  Constitutional: Positive for malaise/fatigue. Negative for fever.  HENT: Negative for congestion.   Eyes: Negative for discharge.  Respiratory: Positive for shortness of breath.   Cardiovascular: Positive for chest pain. Negative for palpitations and leg swelling.  Gastrointestinal: Negative for nausea, abdominal pain and diarrhea.  Genitourinary: Negative for dysuria.  Musculoskeletal: Negative for falls.  Skin: Negative for rash.  Neurological: Negative for loss of consciousness and headaches.  Endo/Heme/Allergies: Negative for polydipsia.  Psychiatric/Behavioral: Negative for depression and suicidal ideas. The patient is not nervous/anxious and does not have insomnia.      Objective  BP 124/91  Pulse 62  Temp(Src) 98.3 F (36.8 C) (Oral)  Ht 5' 2.75" (1.594 m)  Wt 141 lb 1.9 oz (64.012 kg)  BMI 25.20 kg/m2  SpO2 100%  LMP 09/21/2010  Physical Exam  Physical Exam  Constitutional: She is oriented to person, place, and time and well-developed, well-nourished, and in no distress. No distress.  HENT:  Head: Normocephalic and atraumatic.  Eyes: Conjunctivae are normal.  Neck: Neck supple. No thyromegaly present.  Cardiovascular: Normal rate, regular rhythm and normal heart sounds.   No murmur heard. Pulmonary/Chest: Effort normal and breath sounds normal. She has no wheezes.  Abdominal: She  exhibits no distension and no mass.  Musculoskeletal: She exhibits no edema.  Lymphadenopathy:    She has no cervical adenopathy.       Right cervical: No superficial cervical adenopathy present.      Left cervical: No superficial cervical adenopathy present.    She has axillary adenopathy.       Left axillae with 3 discreet lymph nodes and right with 1, tender, circumscribed, no other breast changes noted  Neurological: She is alert and oriented to person, place, and time.  Skin: Skin is warm and dry. No rash noted. She is not diaphoretic.  Psychiatric: Memory, affect and judgment normal.    No results found for this basename: TSH   Lab Results  Component Value Date   WBC 9.6 10/08/2010  HGB 13.4 10/08/2010   HCT 40.3 10/08/2010   MCV 94.1 10/08/2010   PLT 208.0 10/08/2010   Lab Results  Component Value Date   CREATININE 0.8 09/24/2010   BUN 17 09/24/2010   NA 137 09/24/2010   K 4.3 09/24/2010   CL 103 09/24/2010   CO2 29 09/24/2010     Assessment & Plan  HTN (hypertension) Improved on Metoprolol but she is struggling with increased fatigue and feels light headed 2-3 hours after taking it. Will stop Metoprolol and start Bystolic 5 mg po qd. Samples given  Lymphadenopathy Persistent enlarged lymph nodes in b/l axillae, needs a diagnostic mgm to evaluate. Cbc is normalized, differential is normalized. Did have some tender LNs in her right lower extremity, these are no longer tender and maybe slightly smaller. Will continue to monitor  Chest pain, atypical Patient had one episode of sharp CP with some mild associated sob, nausea last week. She had been struggling with some increased asthma symptoms and increased use of Albuterol prior to that. Suspect some mild costochondritis, she will report if symptoms recur for further consideration

## 2010-10-08 NOTE — Assessment & Plan Note (Addendum)
Persistent enlarged lymph nodes in b/l axillae, needs a diagnostic mgm to evaluate. Cbc is normalized, differential is normalized. Did have some tender LNs in her right lower extremity, these are no longer tender and maybe slightly smaller. Will continue to monitor

## 2010-10-08 NOTE — Assessment & Plan Note (Signed)
Patient had one episode of sharp CP with some mild associated sob, nausea last week. She had been struggling with some increased asthma symptoms and increased use of Albuterol prior to that. Suspect some mild costochondritis, she will report if symptoms recur for further consideration

## 2010-10-08 NOTE — Assessment & Plan Note (Signed)
Improved on Metoprolol but she is struggling with increased fatigue and feels light headed 2-3 hours after taking it. Will stop Metoprolol and start Bystolic 5 mg po qd. Samples given

## 2010-10-08 NOTE — Patient Instructions (Signed)

## 2010-10-09 ENCOUNTER — Ambulatory Visit: Payer: BC Managed Care – PPO | Admitting: Family Medicine

## 2010-10-14 LAB — URINALYSIS, ROUTINE W REFLEX MICROSCOPIC
Glucose, UA: NEGATIVE
Nitrite: NEGATIVE
Specific Gravity, Urine: 1.014
pH: 6

## 2010-10-17 ENCOUNTER — Other Ambulatory Visit: Payer: Self-pay | Admitting: Family Medicine

## 2010-10-17 ENCOUNTER — Ambulatory Visit
Admission: RE | Admit: 2010-10-17 | Discharge: 2010-10-17 | Disposition: A | Discharge status: Home / Self Care | Payer: BC Managed Care – PPO | Source: Ambulatory Visit | Attending: Family Medicine | Admitting: Family Medicine

## 2010-10-17 DIAGNOSIS — R591 Generalized enlarged lymph nodes: Secondary | ICD-10-CM

## 2010-10-17 DIAGNOSIS — R0789 Other chest pain: Secondary | ICD-10-CM

## 2010-10-30 ENCOUNTER — Ambulatory Visit: Payer: BC Managed Care – PPO | Admitting: Family Medicine

## 2010-10-30 DIAGNOSIS — Z0289 Encounter for other administrative examinations: Secondary | ICD-10-CM

## 2010-12-13 ENCOUNTER — Telehealth: Payer: Self-pay | Admitting: Family Medicine

## 2010-12-13 MED ORDER — AMPHETAMINE-DEXTROAMPHETAMINE 20 MG PO TABS: 20.0000 mg | ORAL_TABLET | Freq: Two times a day (BID) | ORAL | Status: DC

## 2010-12-13 NOTE — Telephone Encounter (Signed)
Please advise refill? 

## 2010-12-13 NOTE — Telephone Encounter (Signed)
Patient is out of adderal, she would like to pick up Rx today please leave mess on patient's phone when ready to pick up. She will have a break between 12-1 or her husband Greggory Stallion can pick it up

## 2010-12-13 NOTE — Telephone Encounter (Signed)
Patient was supposed to return in October for further evaluation and did not return. As long as she is not struggling with any pain or palpitations, I am willing to giv her a one month prescription for her Adderall this time but I will not filll it again until I get a visit with vital signs taken. OK to refill Adderall with same strentgth, same sig, same number and no refills

## 2010-12-13 NOTE — Telephone Encounter (Signed)
Dr Abner Greenspan informed Lowella Bandy what the patient needs to do for further refills. RX at front desk

## 2010-12-16 ENCOUNTER — Encounter: Payer: Self-pay | Admitting: Family Medicine

## 2010-12-16 ENCOUNTER — Ambulatory Visit (INDEPENDENT_AMBULATORY_CARE_PROVIDER_SITE_OTHER): Payer: BC Managed Care – PPO | Admitting: Family Medicine

## 2010-12-16 VITALS — BP 111/76 | HR 67 | Temp 98.1°F | Ht 62.75 in | Wt 144.4 lb

## 2010-12-16 DIAGNOSIS — J209 Acute bronchitis, unspecified: Secondary | ICD-10-CM

## 2010-12-16 DIAGNOSIS — F909 Attention-deficit hyperactivity disorder, unspecified type: Secondary | ICD-10-CM

## 2010-12-16 DIAGNOSIS — I10 Essential (primary) hypertension: Secondary | ICD-10-CM

## 2010-12-16 DIAGNOSIS — B009 Herpesviral infection, unspecified: Secondary | ICD-10-CM

## 2010-12-16 DIAGNOSIS — R41840 Attention and concentration deficit: Secondary | ICD-10-CM | POA: Insufficient documentation

## 2010-12-16 DIAGNOSIS — R413 Other amnesia: Secondary | ICD-10-CM

## 2010-12-16 DIAGNOSIS — M542 Cervicalgia: Secondary | ICD-10-CM

## 2010-12-16 DIAGNOSIS — J40 Bronchitis, not specified as acute or chronic: Secondary | ICD-10-CM

## 2010-12-16 DIAGNOSIS — J45909 Unspecified asthma, uncomplicated: Secondary | ICD-10-CM

## 2010-12-16 DIAGNOSIS — Z23 Encounter for immunization: Secondary | ICD-10-CM

## 2010-12-16 DIAGNOSIS — Z87820 Personal history of traumatic brain injury: Secondary | ICD-10-CM

## 2010-12-16 HISTORY — DX: Cervicalgia: M54.2

## 2010-12-16 HISTORY — DX: Attention and concentration deficit: R41.840

## 2010-12-16 HISTORY — DX: Acute bronchitis, unspecified: J20.9

## 2010-12-16 MED ORDER — AMPHETAMINE-DEXTROAMPHETAMINE 20 MG PO TABS: 20.0000 mg | ORAL_TABLET | Freq: Two times a day (BID) | ORAL | Status: DC

## 2010-12-16 MED ORDER — AZITHROMYCIN 250 MG PO TABS: ORAL_TABLET | ORAL | Status: AC

## 2010-12-16 MED ORDER — GUAIFENESIN ER 600 MG PO TB12: 600.0000 mg | ORAL_TABLET | Freq: Two times a day (BID) | ORAL | Status: DC

## 2010-12-16 MED ORDER — ALBUTEROL SULFATE HFA 108 (90 BASE) MCG/ACT IN AERS: 1.0000 | INHALATION_SPRAY | RESPIRATORY_TRACT | Status: DC | PRN

## 2010-12-16 MED ORDER — METHYLPREDNISOLONE 4 MG PO KIT: PACK | ORAL | Status: AC

## 2010-12-16 MED ORDER — AZITHROMYCIN 250 MG PO TABS: ORAL_TABLET | ORAL | Status: DC

## 2010-12-16 MED ORDER — LIDOCAINE HCL 2 % EX GEL: CUTANEOUS | Status: DC

## 2010-12-16 NOTE — Patient Instructions (Signed)
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
## 2010-12-16 NOTE — Assessment & Plan Note (Signed)
Was restarted on Adderall 20 mg twice a day at her last visit initially she felt it wasn't helping but as her postconcussive symptoms have improved she actually believes it is helping at this time and she is comfortable with maintaining the current dose. We will continue this medication and reassess her vital signs in 10 weeks' time or as needed. She will call with any concerns

## 2010-12-16 NOTE — Assessment & Plan Note (Signed)
Patient will switch to 5 mg daily she had very low blood pressure sometimes the diastolic in the 50s and she felt very tired. She then dropped to every other day and ultimately every fifth day before she found good balance. When she stopped altogether she felt that it went high again. We will have her start systolic 5 mg tablet to half a tablet every other day maintain good hydration and see if that culture blood pressure. If this is inadequate we can increase anything is too much we can drop to every third day

## 2010-12-16 NOTE — Assessment & Plan Note (Signed)
She is requesting a refill on some lidocaine gel which she's used topically on occasion in the past. This provided today
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
## 2010-12-16 NOTE — Assessment & Plan Note (Addendum)
Zpack and steroid taper, push clear fluids and use Albuterol as needed report if no improvement
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
## 2011-01-02 ENCOUNTER — Telehealth: Payer: Self-pay

## 2011-01-02 NOTE — Telephone Encounter (Signed)
I tried to call patient to see if she is still having symptoms? Why does she want Macrobid refilled? Pts phone states it can't receive anymore messages because its full. Will try later

## 2011-01-06 ENCOUNTER — Other Ambulatory Visit: Payer: Self-pay | Admitting: Family Medicine

## 2011-01-06 MED ORDER — NITROFURANTOIN MONOHYD MACRO 100 MG PO CAPS: ORAL_CAPSULE | ORAL | Status: DC

## 2011-01-06 NOTE — Telephone Encounter (Signed)
Message left per DPR that RX's sent to pharmacy.

## 2011-01-06 NOTE — Telephone Encounter (Signed)
Rx's for both 7d course and for prn prophylaxis use sent just now.

## 2011-01-06 NOTE — Telephone Encounter (Signed)
Pt RC and left message with Lowella Bandy to have CMA call.  I have attempted to contact this patient by phone with the following results: left message to return my call on answering machine [phone]).

## 2011-01-06 NOTE — Telephone Encounter (Signed)
RC from pt.  Advised recurrent UTI mentioned in first OV note in September, but no specific instructions regarding calling in PRN prescriptions.  Pt states she takes as prophylactic after cath, sex, swimming, hot tub, etc and has done so for the last 10-11 years.  Pt states whoever she is seeing at the time usually follows and fills for her.  She has not followed up with urology since Dr. Vonita Moss retired.  She also takes BID x 7 days for full blown infection.  Pt states she had to cath herself Saturday night and is having "full fledged" infection.  Pharmacy gave her enough pills to get through weekend.  Pt did not get refill at last visit as she did not need one at the time.  Pt states she and Dr. Abner Greenspan spoke in detail about having this med managed by our office.  Please advise.

## 2011-01-16 ENCOUNTER — Telehealth: Payer: Self-pay | Admitting: Family Medicine

## 2011-01-16 NOTE — Telephone Encounter (Signed)
Pt notified of recommendation.  I apologized for late phone call.  Pt will go to Urgent Care tonight as she is unable to come in tomorrow.  Again apologized for not returning call earlier in the day.

## 2011-01-16 NOTE — Telephone Encounter (Signed)
Pt needs office eval prior to any further testing or meds.--PM

## 2011-01-16 NOTE — Telephone Encounter (Signed)
I spoke with patient and offered her an appt tomorrow and she states she can't come in tomorrow? Pt states she has a funeral in the morning and has to work tomorrow afternoon? Pt would like to know if she could come in this afternoon to drop off urine sample? Please advise?

## 2011-01-16 NOTE — Telephone Encounter (Signed)
Patient feels like she has to "go to the bathroom" all of the time today. She recently took medication that Dr Abner Greenspan prescribed which was working until she had to cath herself. She would like to come in to have urine cultured, please call patient

## 2011-02-17 ENCOUNTER — Ambulatory Visit (INDEPENDENT_AMBULATORY_CARE_PROVIDER_SITE_OTHER): Payer: BC Managed Care – PPO | Admitting: Family Medicine

## 2011-02-17 ENCOUNTER — Encounter: Payer: Self-pay | Admitting: Family Medicine

## 2011-02-17 VITALS — BP 114/64 | HR 98 | Temp 98.1°F | Ht 62.75 in | Wt 144.8 lb

## 2011-02-17 DIAGNOSIS — I10 Essential (primary) hypertension: Secondary | ICD-10-CM

## 2011-02-17 DIAGNOSIS — T7840XA Allergy, unspecified, initial encounter: Secondary | ICD-10-CM

## 2011-02-17 DIAGNOSIS — J209 Acute bronchitis, unspecified: Secondary | ICD-10-CM

## 2011-02-17 DIAGNOSIS — N390 Urinary tract infection, site not specified: Secondary | ICD-10-CM

## 2011-02-17 DIAGNOSIS — M542 Cervicalgia: Secondary | ICD-10-CM

## 2011-02-17 DIAGNOSIS — Z309 Encounter for contraceptive management, unspecified: Secondary | ICD-10-CM | POA: Insufficient documentation

## 2011-02-17 DIAGNOSIS — N3010 Interstitial cystitis (chronic) without hematuria: Secondary | ICD-10-CM

## 2011-02-17 MED ORDER — NITROFURANTOIN MONOHYD MACRO 100 MG PO CAPS: ORAL_CAPSULE | ORAL | Status: DC

## 2011-02-17 NOTE — Patient Instructions (Signed)

## 2011-02-18 ENCOUNTER — Encounter: Payer: Self-pay | Admitting: Family Medicine

## 2011-02-18 DIAGNOSIS — N3010 Interstitial cystitis (chronic) without hematuria: Secondary | ICD-10-CM | POA: Insufficient documentation

## 2011-02-18 HISTORY — DX: Interstitial cystitis (chronic) without hematuria: N30.10

## 2011-02-18 NOTE — Assessment & Plan Note (Signed)
Well controlled on Bystolic, patient agrees to continue, minimize sodium

## 2011-02-18 NOTE — Assessment & Plan Note (Signed)
No recent flares 

## 2011-02-18 NOTE — Assessment & Plan Note (Signed)
Recent uti flared her IC she is now reestablished with urology and back on Elmiron which she believes is helping
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
## 2011-02-18 NOTE — Assessment & Plan Note (Signed)
Resolved
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
## 2011-02-18 NOTE — Assessment & Plan Note (Signed)
Is given a standing prescription of Macrobid to use aas needed for future UTIs

## 2011-05-12 ENCOUNTER — Other Ambulatory Visit: Payer: Self-pay

## 2011-05-12 MED ORDER — NORETHIN-ETH ESTRAD-FE BIPHAS 1 MG-10 MCG / 10 MCG PO TABS: 1.0000 | ORAL_TABLET | Freq: Every day | ORAL | Status: DC

## 2011-05-19 ENCOUNTER — Other Ambulatory Visit (INDEPENDENT_AMBULATORY_CARE_PROVIDER_SITE_OTHER): Payer: BC Managed Care – PPO

## 2011-05-19 DIAGNOSIS — I10 Essential (primary) hypertension: Secondary | ICD-10-CM

## 2011-05-19 LAB — RENAL FUNCTION PANEL
Albumin: 3.8 g/dL (ref 3.5–5.2)
Calcium: 9 mg/dL (ref 8.4–10.5)
Chloride: 105 mEq/L (ref 96–112)
Phosphorus: 3.1 mg/dL (ref 2.3–4.6)
Potassium: 4.2 mEq/L (ref 3.5–5.1)
Sodium: 142 mEq/L (ref 135–145)

## 2011-05-19 LAB — CBC
HCT: 39.2 % (ref 36.0–46.0)
RBC: 4.15 Mil/uL (ref 3.87–5.11)
WBC: 7.6 10*3/uL (ref 4.5–10.5)

## 2011-05-19 LAB — HEPATIC FUNCTION PANEL
AST: 21 U/L (ref 0–37)
Albumin: 3.8 g/dL (ref 3.5–5.2)
Alkaline Phosphatase: 40 U/L (ref 39–117)
Total Protein: 7.2 g/dL (ref 6.0–8.3)

## 2011-05-19 LAB — LIPID PANEL: VLDL: 69.8 mg/dL — ABNORMAL HIGH (ref 0.0–40.0)

## 2011-05-19 LAB — TSH: TSH: 8.51 u[IU]/mL — ABNORMAL HIGH (ref 0.35–5.50)

## 2011-05-20 ENCOUNTER — Other Ambulatory Visit: Payer: Self-pay | Admitting: Family Medicine

## 2011-05-20 MED ORDER — NORETHIN ACE-ETH ESTRAD-FE 1-20 MG-MCG(24) PO TABS: 1.0000 | ORAL_TABLET | Freq: Every day | ORAL | Status: DC

## 2011-05-20 NOTE — Telephone Encounter (Signed)
I also gave patient her lab results and she stated she hasnt taken her synthroid in months. Per MD stay off of it until patient comes in next week

## 2011-05-27 ENCOUNTER — Encounter: Payer: Self-pay | Admitting: Family Medicine

## 2011-05-27 ENCOUNTER — Ambulatory Visit (INDEPENDENT_AMBULATORY_CARE_PROVIDER_SITE_OTHER): Payer: BC Managed Care – PPO | Admitting: Family Medicine

## 2011-05-27 VITALS — BP 118/85 | HR 61 | Temp 98.2°F | Ht 62.75 in | Wt 143.4 lb

## 2011-05-27 DIAGNOSIS — R1011 Right upper quadrant pain: Secondary | ICD-10-CM

## 2011-05-27 DIAGNOSIS — R41840 Attention and concentration deficit: Secondary | ICD-10-CM

## 2011-05-27 DIAGNOSIS — E039 Hypothyroidism, unspecified: Secondary | ICD-10-CM

## 2011-05-27 DIAGNOSIS — E785 Hyperlipidemia, unspecified: Secondary | ICD-10-CM

## 2011-05-27 DIAGNOSIS — I10 Essential (primary) hypertension: Secondary | ICD-10-CM

## 2011-05-27 DIAGNOSIS — F909 Attention-deficit hyperactivity disorder, unspecified type: Secondary | ICD-10-CM

## 2011-05-27 HISTORY — DX: Right upper quadrant pain: R10.11

## 2011-05-27 MED ORDER — AMPHETAMINE-DEXTROAMPHETAMINE 20 MG PO TABS: 20.0000 mg | ORAL_TABLET | Freq: Two times a day (BID) | ORAL | Status: DC

## 2011-05-27 NOTE — Assessment & Plan Note (Signed)
Doing well on current dose of Bystolic, has ot had any hi or low numbers when she checks it.

## 2011-05-27 NOTE — Patient Instructions (Signed)
Cholesterol Cholesterol is a white, waxy, fat-like protein needed by your body in small amounts. The liver makes all the cholesterol you need. It is carried from the liver by the blood through the blood vessels. Deposits (plaque) may build up on blood vessel walls. This makes the arteries narrower and stiffer. Plaque increases the risk for heart attack and stroke. You cannot feel your cholesterol level even if it is very high. The only way to know is by a blood test to check your lipid (fats) levels. Once you know your cholesterol levels, you should keep a record of the test results. Work with your caregiver to to keep your levels in the desired range. WHAT THE RESULTS MEAN:  Total cholesterol is a rough measure of all the cholesterol in your blood.   LDL is the so-called bad cholesterol. This is the type that deposits cholesterol in the walls of the arteries. You want this level to be low.   HDL is the good cholesterol because it cleans the arteries and carries the LDL away. You want this level to be high.   Triglycerides are fat that the body can either burn for energy or store. High levels are closely linked to heart disease.  DESIRED LEVELS:  Total cholesterol below 200.   LDL below 100 for people at risk, below 70 for very high risk.   HDL above 50 is good, above 60 is best.   Triglycerides below 150.  HOW TO LOWER YOUR CHOLESTEROL:  Diet.   Choose fish or white meat chicken and [place], roasted or baked. Limit fatty cuts of red meat, fried foods, and processed meats, such as sausage and lunch meat.   Eat lots of fresh fruits and vegetables. Choose whole grains, beans, pasta, potatoes and cereals.   Use only small amounts of olive, corn or canola oils. Avoid butter, mayonnaise, shortening or palm kernel oils. Avoid foods with trans-fats.   Use skim/nonfat milk and low-fat/nonfat yogurt and cheeses. Avoid whole milk, cream, ice cream, egg yolks and cheeses. Healthy desserts include  angel food cake, ginger snaps, animal crackers, hard candy, popsicles, and low-fat/nonfat frozen yogurt. Avoid pastries, cakes, pies and cookies.   Exercise.   A regular program helps decrease LDL and raises HDL.   Helps with weight control.   Do things that increase your activity level like gardening, walking, or taking the stairs.   Medication.   May be prescribed by your caregiver to help lowering cholesterol and the risk for heart disease.   You may need medicine even if your levels are normal if you have several risk factors.  HOME CARE INSTRUCTIONS   Follow your diet and exercise programs as suggested by your caregiver.   Take medications as directed.   Have blood work done when your caregiver feels it is necessary.  MAKE SURE YOU:   Understand these instructions.   Will watch your condition.   Will get help right away if you are not doing well or get worse.  Document Released: 09/24/2000 Document Revised: 12/19/2010 Document Reviewed: 03/17/2007 [facility] Patient Information 2012 [place], [place].  MegaRed caps daily, add a psyllium fiber supplement, continue diet and exercise regimen

## 2011-05-27 NOTE — Assessment & Plan Note (Signed)
Several episodes over past couple of weeks of sudden onset ruq and epigastric pain 1-2 hours after eating spicy and fatty foods. She is to avoid fatty and spicy foods and a RUQ ultrasound is ordered.

## 2011-05-27 NOTE — Assessment & Plan Note (Signed)
Triglycerides are the major concern. She is encouraged to avoid trans fats, increase exercise and start megaRed and a psyllium supplement

## 2011-06-01 DIAGNOSIS — E039 Hypothyroidism, unspecified: Secondary | ICD-10-CM | POA: Insufficient documentation

## 2011-06-01 NOTE — Assessment & Plan Note (Signed)
Doing well on Adderall, given refills today 

## 2011-06-01 NOTE — Progress Notes (Signed)
Patient ID: Kristen [name], female   DOB: [birth year], 36 y.o.   MRN: [id number] Kristen [name] [id number] [birth year] 06/01/2011      Progress Note-Follow Up  Subjective  Chief Complaint  Chief Complaint  Patient presents with  . Follow-up    6 month    HPI  Patient is a 36 year old Caucasian female who is in today for followup. She reports her blood pressure to a very well on her current dose of systolic. She is happy with the Adderall helps her concentrate and get her work done. She's not had any recent illness but she has had a couple of episodes of right upper quadrant pain. Both occurred within 2 hours of eating fatty meals. She right quadrant pain with nausea and diarrhea and some occasional vomiting. Has a strong family history of gallbladder disease. She has made some dietary changes has been avoiding sodium taking supplements and a trying to manage her thyroid disease with lifestyle changes. She's not had any chest pain, palpitations, shortness of breath or GU in the last several months.complaints  Past Medical History  Diagnosis Date  . Thyroid disease   . Hypertension   . ADD (attention deficit disorder)   . Fibromyalgia   . Chicken pox as a child  . Allergy     seasonal  . Asthma     mild, intermittent  . Recurrent UTI   . Anemia     blood loss  . Hyperlipidemia   . Depression 2002    post partum  . Renal lithiasis 09/24/2010  . Concussion 09/24/2010  . History of concussion 09/24/2010  . 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 Female bladder prolapse, acquired 09/24/2010  . HTN (hypertension) 09/24/2010  . Subacute and chronic vaginitis 09/24/2010  . Lymphadenopathy 09/24/2010  . Migraine aura, persistent, with cerebral infarct, status over 72 hours 09/24/2010  . History of migraines 09/24/2010  . Chest pain, atypical 10/08/2010  . Neck pain, musculoskeletal 12/16/2010  . Poor concentration 12/16/2010  . Bronchitis, acute 12/16/2010  . Contraceptive management 02/17/2011  . Interstitial  cystitis 02/18/2011  . RUQ pain 05/27/2011    Past Surgical History  Procedure Date  . Incontinence surgery 2003    Family History  Problem Relation Age of Onset  . Heart attack Father   . Hyperlipidemia Father   . Hypertension Father   . Alcohol abuse Father   . Heart disease Father   . Heart attack Brother 45  . Hypertension Brother   . Heart disease Brother 45    MI  . Dementia Maternal Grandfather 74    early stages  . Heart attack Paternal Grandfather   . Stroke Paternal Grandfather   . Heart disease Paternal Grandfather   . Alcohol abuse Paternal Grandfather   . Hyperlipidemia Paternal Grandfather   . Hypertension Paternal Grandfather     History   Social History  . Marital Status: Married    Spouse Name: N/A    Number of Children: N/A  . Years of Education: N/A   Occupational History  . Not on file.   Social History Main Topics  . Smoking status: Never Smoker   . Smokeless tobacco: Never Used  . Alcohol Use: Yes     3 drinks weekly  . Drug Use: No  . Sexually Active: Yes -- Female partner(s)   Other Topics Concern  . Not on file   Social History Narrative  . No narrative on file    Current Outpatient Prescriptions on File  Prior to Visit  Medication Sig Dispense Refill  . acyclovir (ZOVIRAX) 400 MG tablet Take 400 mg by mouth 2 (two) times daily.        Marland Kitchen albuterol (PROVENTIL HFA;VENTOLIN HFA) 108 (90 BASE) MCG/ACT inhaler Inhale 1 puff into the lungs every 4 (four) hours as needed for wheezing. 1-2 puffs po bid prn asthma, as directed by pulmonolgy  1 Inhaler  3  . amphetamine-dextroamphetamine (ADDERALL) 20 MG tablet Take 1 tablet (20 mg total) by mouth 2 (two) times daily. July 2013 rx  60 tablet  0  . budesonide-formoterol (SYMBICORT) 160-4.5 MCG/ACT inhaler 1-2 puffs po bid prn asthma flare as directed by pulmonology  1 Inhaler  0  . HYDROcodone-acetaminophen (VICODIN) 5-500 MG per tablet       . Norethindrone Acetate-Ethinyl Estrad-FE (LOESTRIN 24  FE) 1-20 MG-MCG(24) tablet Take 1 tablet by mouth daily.  1 Package  2  . ELMIRON 100 MG capsule       . levothyroxine (SYNTHROID, LEVOTHROID) 137 MCG tablet Take 137 mcg by mouth as directed. Alternate with 125 mcg       . lidocaine (XYLOCAINE JELLY) 2 % jelly Apply to affected area daily prn  30 mL  1  . nebivolol (BYSTOLIC) 5 MG tablet Take 1 tablet (5 mg total) by mouth daily.  28 tablet  0  . nitrofurantoin, macrocrystal-monohydrate, (MACROBID) 100 MG capsule 1 tab po qd x 1 dose prn for prevention of UTI (after self cath, after swimming or hot tub use, after intercourse)  30 capsule  1  . nystatin (MYCOSTATIN) 100000 UNIT/ML suspension         No Known Allergies  Review of Systems  Review of Systems  Constitutional: Negative for fever and malaise/fatigue.  HENT: Negative for congestion.   Eyes: Negative for discharge.  Respiratory: Negative for shortness of breath.   Cardiovascular: Negative for chest pain, palpitations and leg swelling.  Gastrointestinal: Negative for nausea, abdominal pain and diarrhea.  Genitourinary: Negative for dysuria.  Musculoskeletal: Negative for falls.  Skin: Negative for rash.  Neurological: Negative for loss of consciousness and headaches.  Endo/Heme/Allergies: Negative for polydipsia.  Psychiatric/Behavioral: Negative for depression and suicidal ideas. The patient is not nervous/anxious and does not have insomnia.     Objective  BP 118/85  Pulse 61  Temp(Src) 98.2 F (36.8 C) (Temporal)  Ht 5' 2.75" (1.594 m)  Wt 143 lb 6.4 oz (65.046 kg)  BMI 25.61 kg/m2  SpO2 98%  LMP 05/14/2011  Physical Exam  Physical Exam  Constitutional: She is oriented to person, place, and time and well-developed, well-nourished, and in no distress. No distress.  HENT:  Head: Normocephalic and atraumatic.  Eyes: Conjunctivae are normal.  Neck: Neck supple. No thyromegaly present.  Cardiovascular: Normal rate, regular rhythm and normal heart sounds.   No  murmur heard. Pulmonary/Chest: Effort normal and breath sounds normal. She has no wheezes.  Abdominal: She exhibits no distension and no mass.  Musculoskeletal: She exhibits no edema.  Lymphadenopathy:    She has no cervical adenopathy.  Neurological: She is alert and oriented to person, place, and time.  Skin: Skin is warm and dry. No rash noted. She is not diaphoretic.  Psychiatric: Memory, affect and judgment normal.    Lab Results  Component Value Date   TSH 8.51* 05/19/2011   Lab Results  Component Value Date   WBC 7.6 05/19/2011   HGB 12.8 05/19/2011   HCT 39.2 05/19/2011   MCV 94.3 05/19/2011  PLT 195.0 05/19/2011   Lab Results  Component Value Date   CREATININE 0.9 05/19/2011   BUN 14 05/19/2011   NA 142 05/19/2011   K 4.2 05/19/2011   CL 105 05/19/2011   CO2 28 05/19/2011   Lab Results  Component Value Date   ALT 16 05/19/2011   AST 21 05/19/2011   ALKPHOS 40 05/19/2011   BILITOT 0.8 05/19/2011   Lab Results  Component Value Date   CHOL 252* 05/19/2011   Lab Results  Component Value Date   HDL 44.00 05/19/2011   No results found for this basename: [facility]   Lab Results  Component Value Date   TRIG 349.0* 05/19/2011   Lab Results  Component Value Date   CHOLHDL 6 05/19/2011     Assessment & Plan  RUQ pain Several episodes over past couple of weeks of sudden onset ruq and epigastric pain 1-2 hours after eating spicy and fatty foods. She is to avoid fatty and spicy foods and a RUQ ultrasound is ordered.  HTN (hypertension) Doing well on current dose of Bystolic, has ot had any hi or low numbers when she checks it.   Hyperlipidemia Triglycerides are the major concern. She is encouraged to avoid trans fats, increase exercise and start megaRed and a psyllium supplement  Poor concentration Doing well on Adderall, given refills today  Hypothyroid No changes today, continue to monitor

## 2011-06-01 NOTE — Assessment & Plan Note (Addendum)
No changes today, continue to monitor

## 2011-06-10 ENCOUNTER — Other Ambulatory Visit: Payer: BC Managed Care – PPO

## 2011-07-22 ENCOUNTER — Telehealth: Payer: Self-pay | Admitting: General Practice

## 2011-07-22 NOTE — Telephone Encounter (Signed)
Left message for pt to call office with information requested.

## 2011-07-22 NOTE — Telephone Encounter (Signed)
So I need to clarify what she was taking when she last had her blood drawn so I can titrate from there. Was she alternating her 137 and 125 doses as last discussed, was she not taking anything etc? I can then restart a dose and recheck her TSH in 6-8 weeks

## 2011-07-22 NOTE — Telephone Encounter (Signed)
Patient wants to restart synthroid and wants to know what dosage she should take.

## 2011-08-11 ENCOUNTER — Other Ambulatory Visit: Payer: Self-pay | Admitting: *Deleted

## 2011-08-11 MED ORDER — NORETHIN ACE-ETH ESTRAD-FE 1-20 MG-MCG(24) PO TABS: 1.0000 | ORAL_TABLET | Freq: Every day | ORAL | Status: DC

## 2011-08-11 NOTE — Telephone Encounter (Signed)
Faxed refill request received from pharmacy for Loestrin 24-FE Last filled by MD on 05/20/11, 1 pk x 2 Last seen on 05/27/11 Follow up 09/26/11 RX sent.

## 2011-08-28 ENCOUNTER — Other Ambulatory Visit: Payer: Self-pay

## 2011-08-28 MED ORDER — ACYCLOVIR 400 MG PO TABS: 400.0000 mg | ORAL_TABLET | Freq: Two times a day (BID) | ORAL | Status: DC

## 2011-09-16 ENCOUNTER — Other Ambulatory Visit (INDEPENDENT_AMBULATORY_CARE_PROVIDER_SITE_OTHER): Payer: BC Managed Care – PPO

## 2011-09-16 DIAGNOSIS — E039 Hypothyroidism, unspecified: Secondary | ICD-10-CM

## 2011-09-16 DIAGNOSIS — E785 Hyperlipidemia, unspecified: Secondary | ICD-10-CM

## 2011-09-16 DIAGNOSIS — I10 Essential (primary) hypertension: Secondary | ICD-10-CM

## 2011-09-16 LAB — LIPID PANEL
HDL: 41.6 mg/dL (ref >39.00)
Total CHOL/HDL Ratio: 6
Triglycerides: 506 mg/dL — ABNORMAL HIGH (ref 0.0–149.0)

## 2011-09-16 LAB — TSH: TSH: 8.75 u[IU]/mL — ABNORMAL HIGH (ref 0.35–5.50)

## 2011-09-16 LAB — CBC
HCT: 38.1 % (ref 36.0–46.0)
Hemoglobin: 12.5 g/dL (ref 12.0–15.0)
MCV: 93.8 fl (ref 78.0–100.0)
RBC: 4.07 Mil/uL (ref 3.87–5.11)
WBC: 6.9 10*3/uL (ref 4.5–10.5)

## 2011-09-16 LAB — RENAL FUNCTION PANEL
Albumin: 3.7 g/dL (ref 3.5–5.2)
CO2: 26 mEq/L (ref 19–32)
Calcium: 9 mg/dL (ref 8.4–10.5)
Potassium: 4.9 mEq/L (ref 3.5–5.1)
Sodium: 137 mEq/L (ref 135–145)

## 2011-09-16 LAB — HEPATIC FUNCTION PANEL
AST: 19 U/L (ref 0–37)
Bilirubin, Direct: 0 mg/dL (ref 0.0–0.3)
Total Bilirubin: 0.5 mg/dL (ref 0.3–1.2)

## 2011-09-18 MED ORDER — LEVOTHYROXINE SODIUM 25 MCG PO TABS: 25.0000 ug | ORAL_TABLET | Freq: Every day | ORAL | Status: DC

## 2011-09-26 ENCOUNTER — Other Ambulatory Visit (HOSPITAL_COMMUNITY)
Admission: RE | Admit: 2011-09-26 | Discharge: 2011-09-26 | Disposition: A | Discharge status: Home / Self Care | Payer: BC Managed Care – PPO | Source: Ambulatory Visit | Attending: Family Medicine | Admitting: Family Medicine

## 2011-09-26 ENCOUNTER — Encounter: Payer: BC Managed Care – PPO | Admitting: Family Medicine

## 2011-09-26 ENCOUNTER — Ambulatory Visit (INDEPENDENT_AMBULATORY_CARE_PROVIDER_SITE_OTHER): Payer: BC Managed Care – PPO | Admitting: Family Medicine

## 2011-09-26 ENCOUNTER — Encounter: Payer: Self-pay | Admitting: Family Medicine

## 2011-09-26 VITALS — BP 118/82 | HR 73 | Temp 97.4°F | Ht 62.75 in | Wt 141.0 lb

## 2011-09-26 DIAGNOSIS — Z113 Encounter for screening for infections with a predominantly sexual mode of transmission: Secondary | ICD-10-CM | POA: Insufficient documentation

## 2011-09-26 DIAGNOSIS — Z1151 Encounter for screening for human papillomavirus (HPV): Secondary | ICD-10-CM | POA: Insufficient documentation

## 2011-09-26 DIAGNOSIS — E785 Hyperlipidemia, unspecified: Secondary | ICD-10-CM

## 2011-09-26 DIAGNOSIS — Z23 Encounter for immunization: Secondary | ICD-10-CM

## 2011-09-26 DIAGNOSIS — Z01419 Encounter for gynecological examination (general) (routine) without abnormal findings: Secondary | ICD-10-CM | POA: Insufficient documentation

## 2011-09-26 DIAGNOSIS — B009 Herpesviral infection, unspecified: Secondary | ICD-10-CM

## 2011-09-26 DIAGNOSIS — R599 Enlarged lymph nodes, unspecified: Secondary | ICD-10-CM

## 2011-09-26 DIAGNOSIS — N390 Urinary tract infection, site not specified: Secondary | ICD-10-CM

## 2011-09-26 DIAGNOSIS — R87619 Unspecified abnormal cytological findings in specimens from cervix uteri: Secondary | ICD-10-CM | POA: Insufficient documentation

## 2011-09-26 DIAGNOSIS — R6882 Decreased libido: Secondary | ICD-10-CM

## 2011-09-26 DIAGNOSIS — F909 Attention-deficit hyperactivity disorder, unspecified type: Secondary | ICD-10-CM

## 2011-09-26 DIAGNOSIS — G43909 Migraine, unspecified, not intractable, without status migrainosus: Secondary | ICD-10-CM

## 2011-09-26 DIAGNOSIS — E039 Hypothyroidism, unspecified: Secondary | ICD-10-CM

## 2011-09-26 DIAGNOSIS — I10 Essential (primary) hypertension: Secondary | ICD-10-CM

## 2011-09-26 DIAGNOSIS — R591 Generalized enlarged lymph nodes: Secondary | ICD-10-CM

## 2011-09-26 DIAGNOSIS — N760 Acute vaginitis: Secondary | ICD-10-CM | POA: Insufficient documentation

## 2011-09-26 DIAGNOSIS — Z124 Encounter for screening for malignant neoplasm of cervix: Secondary | ICD-10-CM

## 2011-09-26 DIAGNOSIS — R41840 Attention and concentration deficit: Secondary | ICD-10-CM

## 2011-09-26 DIAGNOSIS — Z0000 Encounter for general adult medical examination without abnormal findings: Secondary | ICD-10-CM

## 2011-09-26 HISTORY — DX: Unspecified abnormal cytological findings in specimens from cervix uteri: R87.619

## 2011-09-26 HISTORY — DX: Decreased libido: R68.82

## 2011-09-26 MED ORDER — NIACIN ER 500 MG PO CPCR: ORAL_CAPSULE | ORAL | Status: DC

## 2011-09-26 MED ORDER — AMPHETAMINE-DEXTROAMPHETAMINE 20 MG PO TABS: 20.0000 mg | ORAL_TABLET | Freq: Two times a day (BID) | ORAL | Status: DC

## 2011-09-26 MED ORDER — HYDROCODONE-ACETAMINOPHEN 5-500 MG PO TABS: 1.0000 | ORAL_TABLET | Freq: Three times a day (TID) | ORAL | Status: DC | PRN

## 2011-09-26 MED ORDER — BUDESONIDE-FORMOTEROL FUMARATE 160-4.5 MCG/ACT IN AERO: INHALATION_SPRAY | RESPIRATORY_TRACT | Status: DC

## 2011-09-26 MED ORDER — NITROFURANTOIN MONOHYD MACRO 100 MG PO CAPS: ORAL_CAPSULE | ORAL | Status: DC

## 2011-09-26 MED ORDER — LEVOTHYROXINE SODIUM 137 MCG PO TABS: 137.0000 ug | ORAL_TABLET | Freq: Every day | ORAL | Status: DC

## 2011-09-26 MED ORDER — ALBUTEROL SULFATE HFA 108 (90 BASE) MCG/ACT IN AERS: 1.0000 | INHALATION_SPRAY | RESPIRATORY_TRACT | Status: DC | PRN

## 2011-09-26 MED ORDER — NEBIVOLOL HCL 5 MG PO TABS: 5.0000 mg | ORAL_TABLET | Freq: Every day | ORAL | Status: DC

## 2011-09-26 MED ORDER — EPINEPHRINE 0.3 MG/0.3ML IJ DEVI: 0.3000 mg | INTRAMUSCULAR | Status: DC | PRN

## 2011-09-26 MED ORDER — NORETHIN ACE-ETH ESTRAD-FE 1-20 MG-MCG(24) PO TABS: 1.0000 | ORAL_TABLET | Freq: Every day | ORAL | Status: DC

## 2011-09-26 MED ORDER — ACYCLOVIR 200 MG PO CAPS: 200.0000 mg | ORAL_CAPSULE | Freq: Two times a day (BID) | ORAL | Status: DC

## 2011-09-26 MED ORDER — LIDOCAINE HCL 2 % EX GEL: CUTANEOUS | Status: DC

## 2011-09-26 NOTE — Patient Instructions (Addendum)
Hypertriglyceridemia  Diet for High blood levels of Triglycerides Most fats in food are triglycerides. Triglycerides in your blood are stored as fat in your body. High levels of triglycerides in your blood may put you at a greater risk for heart disease and stroke.  Normal triglyceride levels are less than 150 mg/dL. Borderline high levels are 150-199 mg/dl. High levels are 200 - 499 mg/dL, and very high triglyceride levels are greater than 500 mg/dL. The decision to treat high triglycerides is generally based on the level. For people with borderline or high triglyceride levels, treatment includes weight loss and exercise. Drugs are recommended for people with very high triglyceride levels. Many people who need treatment for high triglyceride levels have metabolic syndrome. This syndrome is a collection of disorders that often include: insulin resistance, high blood pressure, blood clotting problems, high cholesterol and triglycerides. TESTING PROCEDURE FOR TRIGLYCERIDES  You should not eat 4 hours before getting your triglycerides measured. The normal range of triglycerides is between 10 and 250 milligrams per deciliter (mg/dl). Some people may have extreme levels (1000 or above), but your triglyceride level may be too high if it is above 150 mg/dl, depending on what other risk factors you have for heart disease.   People with high blood triglycerides may also have high blood cholesterol levels. If you have high blood cholesterol as well as high blood triglycerides, your risk for heart disease is probably greater than if you only had high triglycerides. High blood cholesterol is one of the main risk factors for heart disease.  CHANGING YOUR DIET  Your weight can affect your blood triglyceride level. If you are more than 20% above your ideal body weight, you may be able to lower your blood triglycerides by losing weight. Eating less and exercising regularly is the best way to combat this. Fat provides  more calories than any other food. The best way to lose weight is to eat less fat. Only 30% of your total calories should come from fat. Less than 7% of your diet should come from saturated fat. A diet low in fat and saturated fat is the same as a diet to decrease blood cholesterol. By eating a diet lower in fat, you may lose weight, lower your blood cholesterol, and lower your blood triglyceride level.  Eating a diet low in fat, especially saturated fat, may also help you lower your blood triglyceride level. Ask your dietitian to help you figure how much fat you can eat based on the number of calories your caregiver has prescribed for you.  Exercise, in addition to helping with weight loss may also help lower triglyceride levels.   Alcohol can increase blood triglycerides. You may need to stop drinking alcoholic beverages.   Too much carbohydrate in your diet may also increase your blood triglycerides. Some complex carbohydrates are necessary in your diet. These may include bread, rice, potatoes, other starchy vegetables and cereals.   Reduce "simple" carbohydrates. These may include pure sugars, candy, honey, and jelly without losing other nutrients. If you have the kind of high blood triglycerides that is affected by the amount of carbohydrates in your diet, you will need to eat less sugar and less high-sugar foods. Your caregiver can help you with this.   Adding 2-4 grams of fish oil (EPA+ DHA) may also help lower triglycerides. Speak with your caregiver before adding any supplements to your regimen.  Following the Diet  Maintain your ideal weight. Your caregivers can help you with a diet. Generally,   eating less food and getting more exercise will help you lose weight. Joining a weight control group may also help. Ask your caregivers for a good weight control group in your area.  Eat low-fat foods instead of high-fat foods. This can help you lose weight too.  These foods are lower in fat. Eat MORE  of these:   Dried beans, peas, and lentils.   Egg whites.   Low-fat cottage cheese.   Fish.   Lean cuts of meat, such as round, sirloin, rump, and flank (cut extra fat off meat you fix).   Whole grain breads, cereals and pasta.   Skim and nonfat dry milk.   Low-fat yogurt.   Poultry without the skin.   Cheese made with skim or part-skim milk, such as mozzarella, parmesan, farmers', ricotta, or pot cheese.  These are higher fat foods. Eat LESS of these:   Whole milk and foods made from whole milk, such as American, blue, cheddar, monterey jack, and swiss cheese   High-fat meats, such as luncheon meats, sausages, knockwurst, bratwurst, hot dogs, ribs, corned beef, ground pork, and regular ground beef.   Fried foods.  Limit saturated fats in your diet. Substituting unsaturated fat for saturated fat may decrease your blood triglyceride level. You will need to read package labels to know which products contain saturated fats.  These foods are high in saturated fat. Eat LESS of these:   Fried pork skins.   Whole milk.   Skin and fat from poultry.   Palm oil.   Butter.   Shortening.   Cream cheese.   Bacon.   Margarines and baked goods made from listed oils.   Vegetable shortenings.   Chitterlings.   Fat from meats.   Coconut oil.   Palm kernel oil.   Lard.   Cream.   Sour cream.   Fatback.   Coffee whiteners and non-dairy creamers made with these oils.   Cheese made from whole milk.  Use unsaturated fats (both polyunsaturated and monounsaturated) moderately. Remember, even though unsaturated fats are better than saturated fats; you still want a diet low in total fat.  These foods are high in unsaturated fat:   Canola oil.   Sunflower oil.   Mayonnaise.   Almonds.   Peanuts.   Pine nuts.   Margarines made with these oils.   Safflower oil.   Olive oil.   Avocados.   Cashews.   Peanut butter.   Sunflower seeds.   Soybean oil.     Peanut oil.   Olives.   Pecans.   Walnuts.   Pumpkin seeds.  Avoid sugar and other high-sugar foods. This will decrease carbohydrates without decreasing other nutrients. Sugar in your food goes rapidly to your blood. When there is excess sugar in your blood, your liver may use it to make more triglycerides. Sugar also contains calories without other important nutrients.  Eat LESS of these:   Sugar, brown sugar, powdered sugar, jam, jelly, preserves, honey, syrup, molasses, pies, candy, cakes, cookies, frosting, pastries, colas, soft drinks, punches, fruit drinks, and regular gelatin.   Avoid alcohol. Alcohol, even more than sugar, may increase blood triglycerides. In addition, alcohol is high in calories and low in nutrients. Ask for sparkling water, or a diet soft drink instead of an alcoholic beverage.  Suggestions for planning and preparing meals   Bake, broil, grill or roast meats instead of frying.   Remove fat from meats and skin from poultry before cooking.   Add spices,   herbs, lemon juice or vinegar to vegetables instead of salt, rich sauces or gravies.   Use a non-stick skillet without fat or use no-stick sprays.   Cool and refrigerate stews and broth. Then remove the hardened fat floating on the surface before serving.   Refrigerate meat drippings and skim off fat to make low-fat gravies.   Serve more fish.   Use less butter, margarine and other high-fat spreads on bread or vegetables.   Use skim or reconstituted non-fat dry milk for cooking.   Cook with low-fat cheeses.   Substitute low-fat yogurt or cottage cheese for all or part of the sour cream in recipes for sauces, dips or congealed salads.   Use half yogurt/half mayonnaise in salad recipes.   Substitute evaporated skim milk for cream. Evaporated skim milk or reconstituted non-fat dry milk can be whipped and substituted for whipped cream in certain recipes.   Choose fresh fruits for dessert instead of  high-fat foods such as pies or cakes. Fruits are naturally low in fat.  When Dining Out   Order low-fat appetizers such as fruit or vegetable juice, pasta with vegetables or tomato sauce.   Select clear, rather than cream soups.   Ask that dressings and gravies be served on the side. Then use less of them.   Order foods that are baked, broiled, poached, steamed, stir-fried, or roasted.   Ask for margarine instead of butter, and use only a small amount.   Drink sparkling water, unsweetened tea or coffee, or diet soft drinks instead of alcohol or other sweet beverages.  QUESTIONS AND ANSWERS ABOUT OTHER FATS IN THE BLOOD: SATURATED FAT, TRANS FAT, AND CHOLESTEROL What is trans fat? Trans fat is a type of fat that is formed when vegetable oil is hardened through a process called hydrogenation. This process helps makes foods more solid, gives them shape, and prolongs their shelf life. Trans fats are also called hydrogenated or partially hydrogenated oils.  What do saturated fat, trans fat, and cholesterol in foods have to do with heart disease? Saturated fat, trans fat, and cholesterol in the diet all raise the level of LDL "bad" cholesterol in the blood. The higher the LDL cholesterol, the greater the risk for coronary heart disease (CHD). Saturated fat and trans fat raise LDL similarly.  What foods contain saturated fat, trans fat, and cholesterol? High amounts of saturated fat are found in animal products, such as fatty cuts of meat, chicken skin, and full-fat dairy products like butter, whole milk, cream, and cheese, and in tropical vegetable oils such as palm, palm kernel, and coconut oil. Trans fat is found in some of the same foods as saturated fat, such as vegetable shortening, some margarines (especially hard or stick margarine), crackers, cookies, baked goods, fried foods, salad dressings, and other processed foods made with partially hydrogenated vegetable oils. Small amounts of trans fat  also occur naturally in some animal products, such as milk products, beef, and lamb. Foods high in cholesterol include liver, other organ meats, egg yolks, shrimp, and full-fat dairy products. How can I use the new food label to make heart-healthy food choices? Check the Nutrition Facts panel of the food label. Choose foods lower in saturated fat, trans fat, and cholesterol. For saturated fat and cholesterol, you can also use the Percent Daily Value (%DV): 5% DV or less is low, and 20% DV or more is high. (There is no %DV for trans fat.) Use the Nutrition Facts panel to choose foods low in   saturated fat and cholesterol, and if the trans fat is not listed, read the ingredients and limit products that list shortening or hydrogenated or partially hydrogenated vegetable oil, which tend to be high in trans fat. POINTS TO REMEMBER: YOU NEED A LITTLE TLC (THERAPEUTIC LIFESTYLE CHANGES)  Discuss your risk for heart disease with your caregivers, and take steps to reduce risk factors.   Change your diet. Choose foods that are low in saturated fat, trans fat, and cholesterol.   Add exercise to your daily routine if it is not already being done. Participate in physical activity of moderate intensity, like brisk walking, for at least 30 minutes on most, and preferably all days of the week. No time? Break the 30 minutes into three, 10-minute segments during the day.   Stop smoking. If you do smoke, contact your caregiver to discuss ways in which they can help you quit.   Do not use street drugs.   Maintain a normal weight.   Maintain a healthy blood pressure.   Keep up with your blood work for checking the fats in your blood as directed by your caregiver.  Document Released: 10/18/2003 Document Revised: 12/19/2010 Document Reviewed: 05/15/2008 [facility] Patient Information 2012 [place], [place].Triglycerides, TG, TRIG This is a test to check your risk of developing heart disease. It is often done as part of a  lipid profile during a regular medical exam or if you are being treated for high triglycerides. This test measures the amount of triglycerides in your blood. Triglycerides are the body's storage form for fat. Most triglycerides are found in fat tissue. Some triglycerides circulate in the blood to provide fuel for muscles to work. Extra triglycerides are found in the blood after eating a meal when fat is being sent from the gut to fat tissue for storage. The test for triglycerides should be done when you are fasting and no extra triglycerides from a recent meal are present.  SAMPLE COLLECTION The test for triglycerides uses a blood sample. Most often, the blood sample is collected using a needle to collect blood from a vein. Sometimes triglycerides are measured using a drop of blood collected by puncturing the skin on a finger. Testing should be done when you are fasting. For 12 to 14 hours before the test, only water is permitted. In addition, alcohol should not be consumed for the 24 hours just before the test. If you are diabetic and your blood sugar is out of control, triglycerides will be very high. NORMAL FINDINGS  Adult/elderly   Female: 40-160 mg/dL or 1.61-0.96 mmol/L (SI units)   Female: 35-135 mg/dL or 0.45-4.09 mmol/L (SI units)   0-36 years   Female: 30-86 mg/dL   Female: 81-19 mg/dL   1-36 years   Female: 82-956 mg/dL   Female: 21-308 mg/dL   65-36 years   Female: 46-962 mg/dL   Female: 95-284 mg/dL   13-36 years   Female: 40-102 mg/dL   Female: 72-536 mg/dL  Ranges for normal findings may vary among different laboratories and hospitals. You should always check with your doctor after having lab work or other tests done to discuss the meaning of your test results and whether your values are considered within normal limits. MEANING OF TEST  Your caregiver will go over the test results with you and discuss the importance and meaning of your results, as well as treatment options  and the need for additional tests if necessary. OBTAINING THE TEST RESULTS It is your responsibility to obtain your  test results. Ask the lab or department performing the test when and how you will get your results. Document Released: 02/02/2004 Document Revised: 12/19/2010 Document Reviewed: 12/12/2007 [facility] Patient Information 2012 [place], [place].

## 2011-09-26 NOTE — Assessment & Plan Note (Signed)
Pap taken today 

## 2011-09-26 NOTE — Assessment & Plan Note (Signed)
Has just restarted Synthroid a couple weeks ago. Recheck levels in 6-8 weeks

## 2011-09-26 NOTE — Assessment & Plan Note (Signed)
adderal working well, refills given today

## 2011-09-26 NOTE — Assessment & Plan Note (Signed)
SQ in LLQ, small, mobile nontender, encouraged to let us know if gets bigger

## 2011-09-26 NOTE — Assessment & Plan Note (Signed)
Patient frustrated with this and difficulty loosing weight. Patient asks if hormones were drawn. She  Is informed this is not standard of care without symptoms. She is encouraged to call back if she decides she wants these checked, she is considering taking an OTC supplement she is advised to let us know if she does so we can check vitals and blood work.

## 2011-09-26 NOTE — Assessment & Plan Note (Signed)
Doing well at this time  

## 2011-09-26 NOTE — Progress Notes (Signed)
Patient ID: Kristen [name], female   DOB: 1975/07/24, 36 y.o.   MRN: [id number] SHERRYN NEEB [id number] 04-Feb-1975 09/26/2011      Progress Note New Patient  Subjective  Chief Complaint  Chief Complaint  Patient presents with  . Annual Exam    Physical, pap, breast exam    HPI  Patient is a 36 year old Caucasian female who is in today for Pap and annual exam. Overall she feels tremulous and he was requesting refills on her medications. Did restart her Synthroid at 137 mcg daily mid July. Has noted slight increase in headaches. Treatment with Excedrin Migraine able to stop it before she develops photophobia or phonophobia. She denies any vaginal discharge but does note that she's had trouble with her abnormal Pap smears. Last week when he returns for congestion when she did have LGSIL. She reports since then she did have further treatment which came back as normal Paps. She says she's had trouble off and on of abnormal Paps for many years. She's complaining of a mild discomfort and bulge in her left lower quadrant after heavy workout he comes and goes it is only mildly uncomfortable. She denies any discharge or GYN complaints. She denies any chest pain, palpitations, shortness of breath, GI complaints. She does continue to struggle with intermittent mild dysuria and urgency secondary to her cystitis but says it's better than it has been in the past.  Past Medical History  Diagnosis Date  . Thyroid disease   . Hypertension   . ADD (attention deficit disorder)   . Fibromyalgia   . Chicken pox as a child  . Allergy     seasonal  . Asthma     mild, intermittent  . Recurrent UTI   . Anemia     blood loss  . Hyperlipidemia   . Depression 2002    post partum  . Renal lithiasis 09/24/2010  . Concussion 09/24/2010  . History of concussion 09/24/2010  . 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 Female bladder prolapse, acquired 09/24/2010  . HTN (hypertension) 09/24/2010  . Subacute and chronic vaginitis  09/24/2010  . Lymphadenopathy 09/24/2010  . Migraine aura, persistent, with cerebral infarct, status over 72 hours 09/24/2010  . History of migraines 09/24/2010  . Chest pain, atypical 10/08/2010  . Neck pain, musculoskeletal 12/16/2010  . Poor concentration 12/16/2010  . Bronchitis, acute 12/16/2010  . Contraceptive management 02/17/2011  . Interstitial cystitis 02/18/2011  . RUQ pain 05/27/2011  . Decreased libido 09/26/2011  . Migraine 09/24/2010    Past Surgical History  Procedure Date  . Incontinence surgery 2003    Family History  Problem Relation Age of Onset  . Heart attack Father   . Hyperlipidemia Father   . Hypertension Father   . Alcohol abuse Father   . Heart disease Father   . Heart attack Brother 45  . Hypertension Brother   . Heart disease Brother 45    MI  . Dementia Maternal Grandfather 42    early stages  . Heart attack Paternal Grandfather   . Stroke Paternal Grandfather   . Heart disease Paternal Grandfather   . Alcohol abuse Paternal Grandfather   . Hyperlipidemia Paternal Grandfather   . Hypertension Paternal Grandfather     History   Social History  . Marital Status: Married    Spouse Name: N/A    Number of Children: N/A  . Years of Education: N/A   Occupational History  . Not on file.   Social History Main  Topics  . Smoking status: Never Smoker   . Smokeless tobacco: Never Used  . Alcohol Use: Yes     3 drinks weekly  . Drug Use: No  . Sexually Active: Yes -- Female partner(s)   Other Topics Concern  . Not on file   Social History Narrative  . No narrative on file    Current Outpatient Prescriptions on File Prior to Visit  Medication Sig Dispense Refill  . acyclovir (ZOVIRAX) 400 MG tablet Take 1 tablet (400 mg total) by mouth 2 (two) times daily.  180 tablet  0  . DISCONTD: albuterol (PROVENTIL HFA;VENTOLIN HFA) 108 (90 BASE) MCG/ACT inhaler Inhale 1 puff into the lungs every 4 (four) hours as needed for wheezing. 1-2 puffs po bid prn  asthma, as directed by pulmonolgy  1 Inhaler  3  . DISCONTD: amphetamine-dextroamphetamine (ADDERALL) 20 MG tablet Take 1 tablet (20 mg total) by mouth 2 (two) times daily. July 2013 rx  60 tablet  0  . DISCONTD: budesonide-formoterol (SYMBICORT) 160-4.5 MCG/ACT inhaler 1-2 puffs po bid prn asthma flare as directed by pulmonology  1 Inhaler  0  . DISCONTD: nebivolol (BYSTOLIC) 5 MG tablet Take 1 tablet (5 mg total) by mouth daily.  28 tablet  0  . DISCONTD: Norethindrone Acetate-Ethinyl Estrad-FE (LOESTRIN 24 FE) 1-20 MG-MCG(24) tablet Take 1 tablet by mouth daily.  1 Package  1  . ELMIRON 100 MG capsule       . nystatin (MYCOSTATIN) 100000 UNIT/ML suspension         No Known Allergies  Review of Systems  Review of Systems  Constitutional: Negative for fever, chills and malaise/fatigue.  HENT: Negative for hearing loss, nosebleeds and congestion.   Eyes: Positive for photophobia. Negative for discharge.  Respiratory: Negative for cough, sputum production, shortness of breath and wheezing.   Cardiovascular: Negative for chest pain, palpitations and leg swelling.  Gastrointestinal: Positive for abdominal pain. Negative for heartburn, nausea, vomiting, diarrhea, constipation and blood in stool.       LLQ pain  Genitourinary: Positive for dysuria. Negative for urgency, frequency and hematuria.  Musculoskeletal: Negative for myalgias, back pain and falls.  Skin: Negative for rash.  Neurological: Positive for headaches. Negative for dizziness, tremors, sensory change, focal weakness, loss of consciousness and weakness.  Endo/Heme/Allergies: Negative for polydipsia. Does not bruise/bleed easily.  Psychiatric/Behavioral: Negative for depression and suicidal ideas. The patient is not nervous/anxious and does not have insomnia.     Objective  BP 118/82  Pulse 73  Temp 97.4 F (36.3 C) (Temporal)  Ht 5' 2.75" (1.594 m)  Wt 141 lb (63.957 kg)  BMI 25.18 kg/m2  SpO2 100%  LMP  08/26/2011  Physical Exam  Physical Exam  Constitutional: She is oriented to person, place, and time and well-developed, well-nourished, and in no distress. No distress.  HENT:  Head: Normocephalic and atraumatic.  Right Ear: External ear normal.  Left Ear: External ear normal.  Nose: Nose normal.  Mouth/Throat: Oropharynx is clear and moist. No oropharyngeal exudate.  Eyes: Conjunctivae normal are normal. Pupils are equal, round, and reactive to light. Right eye exhibits no discharge. Left eye exhibits no discharge. No scleral icterus.  Neck: Normal range of motion. Neck supple. No thyromegaly present.  Cardiovascular: Normal rate, regular rhythm, normal heart sounds and intact distal pulses.   No murmur heard. Pulmonary/Chest: Effort normal and breath sounds normal. No respiratory distress. She has no wheezes. She has no rales.  Abdominal: Soft. Bowel sounds are normal.  She exhibits no distension and no mass. There is no tenderness.  Genitourinary: Uterus normal, cervix normal, right adnexa normal and left adnexa normal. Vaginal discharge found.       Small amount thick white discharge  Musculoskeletal: Normal range of motion. She exhibits no edema and no tenderness.  Lymphadenopathy:    She has no cervical adenopathy.  Neurological: She is alert and oriented to person, place, and time. She has normal reflexes. No cranial nerve deficit. Coordination normal.  Skin: Skin is warm and dry. No rash noted. She is not diaphoretic.       1 cm small, mobile soft SQ LN in LLQ no concerning deeper lesions noted on palpation  Psychiatric: Mood, memory and affect normal.       Assessment & Plan  Poor concentration adderal working well, refills given today  Asthma Doing well at this time  HTN (hypertension) Well controlled on Bystolic given refill today  Migraine Have recurred somewhat with restarting Synthroid but are tolerable, may continue Excedrine migraine prn and let us know if  symptoms worsen  Decreased libido Patient frustrated with this and difficulty loosing weight. Patient asks if hormones were drawn. She  Is informed this is not standard of care without symptoms. She is encouraged to call back if she decides she wants these checked, she is considering taking an OTC supplement she is advised to let us know if she does so we can check vitals and blood work.  Hypothyroid Has just restarted Synthroid a couple weeks ago. Recheck levels in 6-8 weeks  Lymphadenopathy SQ in LLQ, small, mobile nontender, encouraged to let us know if gets bigger  Cervical cancer screening Pap taken today

## 2011-09-26 NOTE — Assessment & Plan Note (Signed)
Well controlled on Bystolic given refill today

## 2011-09-26 NOTE — Assessment & Plan Note (Signed)
Have recurred somewhat with restarting Synthroid but are tolerable, may continue Excedrine migraine prn and let us know if symptoms worsen

## 2011-09-29 ENCOUNTER — Encounter: Payer: BC Managed Care – PPO | Admitting: Family Medicine

## 2011-10-06 ENCOUNTER — Ambulatory Visit (INDEPENDENT_AMBULATORY_CARE_PROVIDER_SITE_OTHER): Payer: BC Managed Care – PPO | Admitting: Family Medicine

## 2011-10-06 ENCOUNTER — Other Ambulatory Visit: Payer: Self-pay

## 2011-10-06 DIAGNOSIS — R87612 Low grade squamous intraepithelial lesion on cytologic smear of cervix (LGSIL): Secondary | ICD-10-CM

## 2011-10-06 MED ORDER — NORETHIN ACE-ETH ESTRAD-FE 1-20 MG-MCG(24) PO TABS: 1.0000 | ORAL_TABLET | Freq: Every day | ORAL | Status: DC

## 2011-10-06 NOTE — Progress Notes (Signed)
Quick Note:  Patient Informed and voiced understanding. Pt states she sees Dr Jenne Pane on [address]- unsure of office name. Ok per patient to proceed with referral  ______

## 2011-10-06 NOTE — Progress Notes (Signed)
Quick Note:  Patient Informed and voiced understanding. Pt states she sees Dr Jenne Pane on [address]- unsure of office name. Ok per patient to proceed with referall ______

## 2011-10-06 NOTE — Progress Notes (Signed)
Patient ID: Kristen [name], female   DOB: [birth year], 36 y.o.   MRN: [id number] Patient pap came back with LGSIL. She has an established relationship with Dr Huel Cote and is requesting referral there, this is ordered today

## 2011-11-12 ENCOUNTER — Telehealth: Payer: Self-pay | Admitting: Family Medicine

## 2011-11-12 NOTE — Telephone Encounter (Signed)
Please set her up with a different ob/gyn office with the same referral. THX

## 2011-11-12 NOTE — Telephone Encounter (Signed)
Patient would like a new referral for an different OBGYN. She showed up for an appointment at [facility] & they didn't have her on their schedule.

## 2011-11-14 NOTE — Telephone Encounter (Signed)
Patient is rescheduled, but with [facility], on 11-26-11, with female provider Dr. Juliene Pina.  I have left message for patient to return my call.

## 2011-11-21 ENCOUNTER — Other Ambulatory Visit: Payer: BC Managed Care – PPO

## 2011-12-18 ENCOUNTER — Other Ambulatory Visit: Payer: BC Managed Care – PPO

## 2011-12-26 ENCOUNTER — Ambulatory Visit (INDEPENDENT_AMBULATORY_CARE_PROVIDER_SITE_OTHER): Payer: BC Managed Care – PPO | Admitting: Family Medicine

## 2011-12-26 ENCOUNTER — Encounter: Payer: Self-pay | Admitting: Family Medicine

## 2011-12-26 VITALS — BP 120/85 | HR 64 | Temp 98.5°F | Ht 62.75 in | Wt 145.4 lb

## 2011-12-26 DIAGNOSIS — I10 Essential (primary) hypertension: Secondary | ICD-10-CM

## 2011-12-26 DIAGNOSIS — N390 Urinary tract infection, site not specified: Secondary | ICD-10-CM

## 2011-12-26 DIAGNOSIS — E663 Overweight: Secondary | ICD-10-CM

## 2011-12-26 DIAGNOSIS — R5383 Other fatigue: Secondary | ICD-10-CM

## 2011-12-26 DIAGNOSIS — J351 Hypertrophy of tonsils: Secondary | ICD-10-CM

## 2011-12-26 DIAGNOSIS — E785 Hyperlipidemia, unspecified: Secondary | ICD-10-CM

## 2011-12-26 DIAGNOSIS — E039 Hypothyroidism, unspecified: Secondary | ICD-10-CM

## 2011-12-26 DIAGNOSIS — H6590 Unspecified nonsuppurative otitis media, unspecified ear: Secondary | ICD-10-CM

## 2011-12-26 DIAGNOSIS — R87619 Unspecified abnormal cytological findings in specimens from cervix uteri: Secondary | ICD-10-CM

## 2011-12-26 DIAGNOSIS — R5381 Other malaise: Secondary | ICD-10-CM

## 2011-12-26 DIAGNOSIS — R635 Abnormal weight gain: Secondary | ICD-10-CM

## 2011-12-26 HISTORY — DX: Overweight: E66.3

## 2011-12-26 LAB — T4, FREE: Free T4: 0.83 ng/dL (ref 0.60–1.60)

## 2011-12-26 LAB — RENAL FUNCTION PANEL
CO2: 27 mEq/L (ref 19–32)
Calcium: 8.7 mg/dL (ref 8.4–10.5)
Chloride: 102 mEq/L (ref 96–112)
Glucose, Bld: 73 mg/dL (ref 70–99)
Potassium: 3.8 mEq/L (ref 3.5–5.1)
Sodium: 135 mEq/L (ref 135–145)

## 2011-12-26 LAB — TSH: TSH: 1.06 u[IU]/mL (ref 0.35–5.50)

## 2011-12-26 LAB — LIPID PANEL: HDL: 34.5 mg/dL — ABNORMAL LOW (ref >39.00)

## 2011-12-26 LAB — HEPATIC FUNCTION PANEL: Total Bilirubin: 0.6 mg/dL (ref 0.3–1.2)

## 2011-12-26 NOTE — Assessment & Plan Note (Signed)
Check thyroid studies today, continue Synthroid (DAW) at same dose for  now

## 2011-12-26 NOTE — Assessment & Plan Note (Signed)
No recent episodes

## 2011-12-26 NOTE — Progress Notes (Signed)
Patient ID: Kristen [name], female   DOB: 1975/01/30, 36 y.o.   MRN: [id number] Kristen [name] [id number] March 23, 1975 12/26/2011      Progress Note-Follow Up  Subjective  Chief Complaint  Chief Complaint  Patient presents with  . Follow-up    3 month    HPI   patient is a 36-ye very significantly every day and still struggling with weight gain. Trying not to eat transplant processed foods. Overall though she is feeling well. She continues to struggle with decreased libido but has had no physical illnesses. No fevers, chills, headaches, chest pain, palpitations, shortness of breath, GI or GU complaints noted. She did start niacin 500 mg at bedtime but noted it seem to disrupt her sleep some but she's going to switch to morning. She recently had her gynecology appointment and reports colposcopy was normal and she was released back to just annual Pap smear.ar-old Caucasian female who is in today for followup. She's very frustrated about weight gain.   Past Medical History  Diagnosis Date  . Thyroid disease   . Hypertension   . ADD (attention deficit disorder)   . Fibromyalgia   . Chicken pox as a child  . Allergy     seasonal  . Asthma     mild, intermittent  . Recurrent UTI   . Anemia     blood loss  . Hyperlipidemia   . Depression 2002    post partum  . Renal lithiasis 09/24/2010  . Concussion 09/24/2010  . History of concussion 09/24/2010  . 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 Female bladder prolapse, acquired 09/24/2010  . HTN (hypertension) 09/24/2010  . Subacute and chronic vaginitis 09/24/2010  . Lymphadenopathy 09/24/2010  . Migraine aura, persistent, with cerebral infarct, status over 72 hours 09/24/2010  . History of migraines 09/24/2010  . Chest pain, atypical 10/08/2010  . Neck pain, musculoskeletal 12/16/2010  . Poor concentration 12/16/2010  . Bronchitis, acute 12/16/2010  . Contraceptive management 02/17/2011  . Interstitial cystitis 02/18/2011  . RUQ pain 05/27/2011  . Decreased libido  09/26/2011  . Migraine 09/24/2010  . Overweight 12/26/2011    Past Surgical History  Procedure Date  . Incontinence surgery 2003    Family History  Problem Relation Age of Onset  . Heart attack Father   . Hyperlipidemia Father   . Hypertension Father   . Alcohol abuse Father   . Heart disease Father   . Heart attack Brother 45  . Hypertension Brother   . Heart disease Brother 45    MI  . Dementia Maternal Grandfather 49    early stages  . Heart attack Paternal Grandfather   . Stroke Paternal Grandfather   . Heart disease Paternal Grandfather   . Alcohol abuse Paternal Grandfather   . Hyperlipidemia Paternal Grandfather   . Hypertension Paternal Grandfather     History   Social History  . Marital Status: Married    Spouse Name: N/A    Number of Children: N/A  . Years of Education: N/A   Occupational History  . Not on file.   Social History Main Topics  . Smoking status: Never Smoker   . Smokeless tobacco: Never Used  . Alcohol Use: Yes     Comment: 3 drinks weekly  . Drug Use: No  . Sexually Active: Yes -- Female partner(s)   Other Topics Concern  . Not on file   Social History Narrative  . No narrative on file    Current Outpatient Prescriptions on  File Prior to Visit  Medication Sig Dispense Refill  . albuterol (PROVENTIL HFA;VENTOLIN HFA) 108 (90 BASE) MCG/ACT inhaler Inhale 1 puff into the lungs every 4 (four) hours as needed for wheezing. 1-2 puffs po bid prn asthma, as directed by pulmonolgy  3 Inhaler  2  . amphetamine-dextroamphetamine (ADDERALL) 20 MG tablet Take 1 tablet (20 mg total) by mouth 2 (two) times daily. Nov 2013 rx  60 tablet  0  . budesonide-formoterol (SYMBICORT) 160-4.5 MCG/ACT inhaler 1-2 puffs po bid prn asthma flare as directed by pulmonology  3 Inhaler  2  . HYDROcodone-acetaminophen (VICODIN) 5-500 MG per tablet Take 1 tablet by mouth every 8 (eight) hours as needed for pain.  30 tablet  2  . levothyroxine (SYNTHROID, LEVOTHROID)  137 MCG tablet Take 1 tablet (137 mcg total) by mouth daily. Has to be Synthroid  90 tablet  1  . lidocaine (XYLOCAINE JELLY) 2 % jelly Apply to affected area daily prn  30 mL  1  . nebivolol (BYSTOLIC) 5 MG tablet Take 1 tablet (5 mg total) by mouth daily.  90 tablet  2  . niacin 500 MG CR capsule 1-2 tabs po at bedtime and take a lowfat snack and 81 mg aspirin 1/2 before      . Norethindrone Acetate-Ethinyl Estrad-FE (LOESTRIN 24 FE) 1-20 MG-MCG(24) tablet Take 1 tablet by mouth daily.  3 Package  2  . nystatin (MYCOSTATIN) 100000 UNIT/ML suspension       . acyclovir (ZOVIRAX) 200 MG capsule Take 1 capsule (200 mg total) by mouth 2 (two) times daily.  180 capsule  2  . ELMIRON 100 MG capsule       . EPINEPHrine (EPI-PEN) 0.3 mg/0.3 mL DEVI Inject 0.3 mLs (0.3 mg total) into the muscle as needed.  2 Device  1  . nitrofurantoin, macrocrystal-monohydrate, (MACROBID) 100 MG capsule 1 tab po qd x 1 dose prn for prevention of UTI (after self cath, after swimming or hot tub use, after intercourse)  90 capsule  2    No Known Allergies  Review of Systems  Review of Systems  Constitutional: Positive for malaise/fatigue. Negative for fever.  HENT: Negative for congestion.   Eyes: Negative for discharge.  Respiratory: Negative for shortness of breath.   Cardiovascular: Negative for chest pain, palpitations and leg swelling.  Gastrointestinal: Negative for nausea, abdominal pain and diarrhea.  Genitourinary: Negative for dysuria.  Musculoskeletal: Negative for falls.  Skin: Negative for rash.  Neurological: Negative for loss of consciousness and headaches.  Endo/Heme/Allergies: Negative for polydipsia.  Psychiatric/Behavioral: Negative for depression and suicidal ideas. The patient is not nervous/anxious and does not have insomnia.     Objective  BP 120/85  Pulse 64  Temp 98.5 F (36.9 C) (Temporal)  Ht 5' 2.75" (1.594 m)  Wt 145 lb 6.4 oz (65.953 kg)  BMI 25.96 kg/m2  SpO2 100%  LMP  12/22/2011  Physical Exam  Physical Exam  Constitutional: She is oriented to person, place, and time and well-developed, well-nourished, and in no distress. No distress.  HENT:  Head: Normocephalic and atraumatic.  Eyes: Conjunctivae normal are normal.  Neck: Neck supple. No thyromegaly present.  Cardiovascular: Normal rate, regular rhythm and normal heart sounds.   No murmur heard. Pulmonary/Chest: Effort normal and breath sounds normal. She has no wheezes.  Abdominal: She exhibits no distension and no mass.  Musculoskeletal: She exhibits no edema.  Lymphadenopathy:    She has no cervical adenopathy.  Neurological: She is alert and  oriented to person, place, and time.  Skin: Skin is warm and dry. No rash noted. She is not diaphoretic.  Psychiatric: Memory, affect and judgment normal.    Lab Results  Component Value Date   TSH 8.75* 09/16/2011   Lab Results  Component Value Date   WBC 6.9 09/16/2011   HGB 12.5 09/16/2011   HCT 38.1 09/16/2011   MCV 93.8 09/16/2011   PLT 181.0 09/16/2011   Lab Results  Component Value Date   CREATININE 0.9 09/16/2011   BUN 11 09/16/2011   NA 137 09/16/2011   K 4.9 09/16/2011   CL 104 09/16/2011   CO2 26 09/16/2011   Lab Results  Component Value Date   ALT 15 09/16/2011   AST 19 09/16/2011   ALKPHOS 37* 09/16/2011   BILITOT 0.5 09/16/2011   Lab Results  Component Value Date   CHOL 249* 09/16/2011   Lab Results  Component Value Date   HDL 41.60 09/16/2011   No results found for this basename: [facility]   Lab Results  Component Value Date   TRIG 506.0* 09/16/2011   Lab Results  Component Value Date   CHOLHDL 6 09/16/2011     Assessment & Plan  HTN (hypertension) Normal today, no changes needed  Hyperlipidemia Has started MegaRed and Niacin 500 mg daily. Check lipids today, avoid trans fats, continue exercise  Recurrent UTI No recent episodes.  Hypothyroid Check thyroid studies today, continue Synthroid (DAW) at same dose for   now  Overweight Encouraged heart healthy diet, decreased po intake, check thyroid studies, avoid trans fats.  Abnormal cervical cytology LGSIL by last pap here has now had colposcopy with gyn and that came back normal. She reports she has been released back to annual paps, will request records from GYN

## 2011-12-26 NOTE — Patient Instructions (Addendum)

## 2011-12-26 NOTE — Assessment & Plan Note (Signed)
Normal today, no changes needed

## 2011-12-26 NOTE — Assessment & Plan Note (Signed)
Has started [facility] and Niacin 500 mg daily. Check lipids today, avoid trans fats, continue exercise

## 2011-12-26 NOTE — Assessment & Plan Note (Signed)
Encouraged heart healthy diet, decreased po intake, check thyroid studies, avoid trans fats.

## 2011-12-26 NOTE — Assessment & Plan Note (Signed)
LGSIL by last pap here has now had colposcopy with gyn and that came back normal. She reports she has been released back to annual paps, will request records from GYN

## 2011-12-29 LAB — LDL CHOLESTEROL, DIRECT: Direct LDL: 163.2 mg/dL

## 2011-12-29 NOTE — Progress Notes (Signed)
Quick Note:  Patient Informed and voiced understanding ______ 

## 2012-01-19 ENCOUNTER — Other Ambulatory Visit: Payer: Self-pay

## 2012-01-19 DIAGNOSIS — M791 Myalgia, unspecified site: Secondary | ICD-10-CM

## 2012-01-19 MED ORDER — CARISOPRODOL 350 MG PO TABS: 350.0000 mg | ORAL_TABLET | Freq: Three times a day (TID) | ORAL | Status: DC | PRN

## 2012-01-19 MED ORDER — ACYCLOVIR 200 MG PO CAPS: 200.0000 mg | ORAL_CAPSULE | Freq: Two times a day (BID) | ORAL | Status: DC

## 2012-03-01 ENCOUNTER — Other Ambulatory Visit: Payer: Self-pay

## 2012-03-01 DIAGNOSIS — F909 Attention-deficit hyperactivity disorder, unspecified type: Secondary | ICD-10-CM

## 2012-03-01 MED ORDER — AMPHETAMINE-DEXTROAMPHETAMINE 20 MG PO TABS: 20.0000 mg | ORAL_TABLET | Freq: Two times a day (BID) | ORAL | Status: DC

## 2012-03-01 NOTE — Telephone Encounter (Signed)
Pt was in today for daughters appt and asked MD to print Adderall RX's  Verbal per MD print next 3 months RX's

## 2012-04-11 ENCOUNTER — Encounter (HOSPITAL_COMMUNITY): Payer: Self-pay | Admitting: *Deleted

## 2012-04-11 ENCOUNTER — Emergency Department (HOSPITAL_COMMUNITY)
Admission: EM | Admit: 2012-04-11 | Discharge: 2012-04-12 | Disposition: A | Discharge status: Home / Self Care | Payer: Managed Care, Other (non HMO) | Attending: Emergency Medicine | Admitting: Emergency Medicine

## 2012-04-11 ENCOUNTER — Emergency Department (HOSPITAL_COMMUNITY): Payer: Managed Care, Other (non HMO)

## 2012-04-11 DIAGNOSIS — Z8619 Personal history of other infectious and parasitic diseases: Secondary | ICD-10-CM | POA: Insufficient documentation

## 2012-04-11 DIAGNOSIS — R61 Generalized hyperhidrosis: Secondary | ICD-10-CM | POA: Insufficient documentation

## 2012-04-11 DIAGNOSIS — R1011 Right upper quadrant pain: Secondary | ICD-10-CM | POA: Insufficient documentation

## 2012-04-11 DIAGNOSIS — K5792 Diverticulitis of intestine, part unspecified, without perforation or abscess without bleeding: Secondary | ICD-10-CM

## 2012-04-11 DIAGNOSIS — F329 Major depressive disorder, single episode, unspecified: Secondary | ICD-10-CM | POA: Insufficient documentation

## 2012-04-11 DIAGNOSIS — F909 Attention-deficit hyperactivity disorder, unspecified type: Secondary | ICD-10-CM | POA: Insufficient documentation

## 2012-04-11 DIAGNOSIS — R6883 Chills (without fever): Secondary | ICD-10-CM | POA: Insufficient documentation

## 2012-04-11 DIAGNOSIS — Z87442 Personal history of urinary calculi: Secondary | ICD-10-CM | POA: Insufficient documentation

## 2012-04-11 DIAGNOSIS — F3289 Other specified depressive episodes: Secondary | ICD-10-CM | POA: Insufficient documentation

## 2012-04-11 DIAGNOSIS — R110 Nausea: Secondary | ICD-10-CM | POA: Insufficient documentation

## 2012-04-11 DIAGNOSIS — Z8739 Personal history of other diseases of the musculoskeletal system and connective tissue: Secondary | ICD-10-CM | POA: Insufficient documentation

## 2012-04-11 DIAGNOSIS — I10 Essential (primary) hypertension: Secondary | ICD-10-CM | POA: Insufficient documentation

## 2012-04-11 DIAGNOSIS — Z8639 Personal history of other endocrine, nutritional and metabolic disease: Secondary | ICD-10-CM | POA: Insufficient documentation

## 2012-04-11 DIAGNOSIS — J45909 Unspecified asthma, uncomplicated: Secondary | ICD-10-CM | POA: Insufficient documentation

## 2012-04-11 DIAGNOSIS — E785 Hyperlipidemia, unspecified: Secondary | ICD-10-CM | POA: Insufficient documentation

## 2012-04-11 DIAGNOSIS — Z8679 Personal history of other diseases of the circulatory system: Secondary | ICD-10-CM | POA: Insufficient documentation

## 2012-04-11 DIAGNOSIS — Z87448 Personal history of other diseases of urinary system: Secondary | ICD-10-CM | POA: Insufficient documentation

## 2012-04-11 DIAGNOSIS — IMO0002 Reserved for concepts with insufficient information to code with codable children: Secondary | ICD-10-CM | POA: Insufficient documentation

## 2012-04-11 DIAGNOSIS — E079 Disorder of thyroid, unspecified: Secondary | ICD-10-CM | POA: Insufficient documentation

## 2012-04-11 DIAGNOSIS — Z3202 Encounter for pregnancy test, result negative: Secondary | ICD-10-CM | POA: Insufficient documentation

## 2012-04-11 DIAGNOSIS — Z8742 Personal history of other diseases of the female genital tract: Secondary | ICD-10-CM | POA: Insufficient documentation

## 2012-04-11 DIAGNOSIS — Z87440 Personal history of urinary (tract) infections: Secondary | ICD-10-CM | POA: Insufficient documentation

## 2012-04-11 DIAGNOSIS — K5732 Diverticulitis of large intestine without perforation or abscess without bleeding: Secondary | ICD-10-CM | POA: Insufficient documentation

## 2012-04-11 DIAGNOSIS — Z862 Personal history of diseases of the blood and blood-forming organs and certain disorders involving the immune mechanism: Secondary | ICD-10-CM | POA: Insufficient documentation

## 2012-04-11 DIAGNOSIS — Z8669 Personal history of other diseases of the nervous system and sense organs: Secondary | ICD-10-CM | POA: Insufficient documentation

## 2012-04-11 DIAGNOSIS — Z79899 Other long term (current) drug therapy: Secondary | ICD-10-CM | POA: Insufficient documentation

## 2012-04-11 LAB — CBC WITH DIFFERENTIAL/PLATELET
Eosinophils Absolute: 0.1 10*3/uL (ref 0.0–0.7)
Hemoglobin: 12.9 g/dL (ref 12.0–15.0)
Lymphocytes Relative: 28 % (ref 12–46)
Lymphs Abs: 4.1 10*3/uL — ABNORMAL HIGH (ref 0.7–4.0)
MCH: 31.9 pg (ref 26.0–34.0)
MCV: 92.8 fL (ref 78.0–100.0)
Monocytes Relative: 8 % (ref 3–12)
Neutrophils Relative %: 63 % (ref 43–77)
RBC: 4.05 MIL/uL (ref 3.87–5.11)
WBC: 14.5 10*3/uL — ABNORMAL HIGH (ref 4.0–10.5)

## 2012-04-11 LAB — HEPATIC FUNCTION PANEL
ALT: 10 U/L (ref 0–35)
Bilirubin, Direct: <0.1 mg/dL (ref 0.0–0.3)
Total Protein: 7.1 g/dL (ref 6.0–8.3)

## 2012-04-11 LAB — URINALYSIS, MICROSCOPIC ONLY
Glucose, UA: NEGATIVE mg/dL
Ketones, ur: NEGATIVE mg/dL
Nitrite: NEGATIVE
Specific Gravity, Urine: 1.022 (ref 1.005–1.030)
pH: 7 (ref 5.0–8.0)

## 2012-04-11 LAB — PREGNANCY, URINE: Preg Test, Ur: NEGATIVE

## 2012-04-11 LAB — BASIC METABOLIC PANEL
CO2: 26 mEq/L (ref 19–32)
Chloride: 101 mEq/L (ref 96–112)
GFR calc Af Amer: >90 mL/min (ref >90)
Potassium: 3.6 mEq/L (ref 3.5–5.1)
Sodium: 137 mEq/L (ref 135–145)

## 2012-04-11 MED ORDER — ONDANSETRON HCL 4 MG/2ML IJ SOLN
4.0000 mg | Freq: Four times a day (QID) | INTRAMUSCULAR | Status: DC | PRN
  Administered 2012-04-12: 4 mg via INTRAVENOUS
  Filled 2012-04-11: qty 2

## 2012-04-11 MED ORDER — MORPHINE SULFATE 4 MG/ML IJ SOLN
4.0000 mg | Freq: Once | INTRAMUSCULAR | Status: AC
  Administered 2012-04-11: 4 mg via INTRAVENOUS
  Filled 2012-04-11: qty 1

## 2012-04-11 MED ORDER — SODIUM CHLORIDE 0.9 % IV SOLN
Freq: Once | INTRAVENOUS | Status: AC
  Administered 2012-04-11: 1 mL via INTRAVENOUS

## 2012-04-11 MED ORDER — SODIUM CHLORIDE 0.9 % IV SOLN: 1000.0000 mL | Freq: Once | INTRAVENOUS | Status: DC

## 2012-04-11 MED ORDER — ONDANSETRON HCL 4 MG/2ML IJ SOLN
4.0000 mg | Freq: Once | INTRAMUSCULAR | Status: AC
  Administered 2012-04-11: 4 mg via INTRAVENOUS
  Filled 2012-04-11: qty 2

## 2012-04-11 MED ORDER — SODIUM CHLORIDE 0.9 % IV BOLUS (SEPSIS)
1000.0000 mL | Freq: Once | INTRAVENOUS | Status: AC
  Administered 2012-04-11: 1000 mL via INTRAVENOUS

## 2012-04-11 MED ORDER — MORPHINE SULFATE 4 MG/ML IJ SOLN
4.0000 mg | INTRAMUSCULAR | Status: DC | PRN
  Administered 2012-04-11 – 2012-04-12 (×2): 4 mg via INTRAVENOUS
  Filled 2012-04-11 (×3): qty 1

## 2012-04-11 NOTE — ED Provider Notes (Signed)
History     CSN: [id number]  Arrival date & time 04/11/12  1844   First MD Initiated Contact with Patient 04/11/12 1924      Chief Complaint  Patient presents with  . Abdominal Pain    (Consider location/radiation/quality/duration/timing/severity/associated sxs/prior treatment) HPI Comments: Pt with no significant medical or surgical hx comes in with cc of abd pain. Pt started having right sided abd pain, upper quadrant and epigastric, around 11 am yday. The pain is constant, worsening, sharp, and radiating to the lateral aspect of the abdomen. She has associated nausea, some chills and sweats. Denies any fevers, emesis. No uti like sx. Does have hx of renal stones on the left side, but the current pain is not as bad as renal stones. Pt has no vaginal discharge, no diarrhea, and she doesn't think she is pregnant.   Patient is a 37 y.o. female presenting with abdominal pain. The history is provided by the patient.  Abdominal Pain Associated symptoms: no chest pain, no constipation, no cough, no diarrhea, no hematuria, no nausea, no shortness of breath and no vomiting     Past Medical History  Diagnosis Date  . Thyroid disease   . Hypertension   . ADD (attention deficit disorder)   . Fibromyalgia   . Chicken pox as a child  . Allergy     seasonal  . Asthma     mild, intermittent  . Recurrent UTI   . Anemia     blood loss  . Hyperlipidemia   . Depression 2002    post partum  . Renal lithiasis 09/24/2010  . Concussion 09/24/2010  . History of concussion 09/24/2010  . 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 Female bladder prolapse, acquired 09/24/2010  . HTN (hypertension) 09/24/2010  . Subacute and chronic vaginitis 09/24/2010  . Lymphadenopathy 09/24/2010  . Migraine aura, persistent, with cerebral infarct, status over 72 hours 09/24/2010  . History of migraines 09/24/2010  . Chest pain, atypical 10/08/2010  . Neck pain, musculoskeletal 12/16/2010  . Poor concentration 12/16/2010  .  Bronchitis, acute 12/16/2010  . Contraceptive management 02/17/2011  . Interstitial cystitis 02/18/2011  . RUQ pain 05/27/2011  . Decreased libido 09/26/2011  . Migraine 09/24/2010  . Overweight 12/26/2011  . Abnormal cervical cytology 09/26/2011    LGSIL in 2011, patient reports repeat pap was normal, has had intermittent abnormals with bx in past but always normalized, has had cryotherapy with good results Menarche at 17, irregular without birth control at times, very heavy LMP 08/26/2011 No gyn concerns, MGM last year normal     Past Surgical History  Procedure Laterality Date  . Incontinence surgery  2003    Family History  Problem Relation Age of Onset  . Heart attack Father   . Hyperlipidemia Father   . Hypertension Father   . Alcohol abuse Father   . Heart disease Father   . Heart attack Brother 45  . Hypertension Brother   . Heart disease Brother 45    MI  . Dementia Maternal Grandfather 110    early stages  . Heart attack Paternal Grandfather   . Stroke Paternal Grandfather   . Heart disease Paternal Grandfather   . Alcohol abuse Paternal Grandfather   . Hyperlipidemia Paternal Grandfather   . Hypertension Paternal Grandfather     History  Substance Use Topics  . Smoking status: Never Smoker   . Smokeless tobacco: Never Used  . Alcohol Use: Yes     Comment: 3 drinks weekly  OB History   Grav Para Term Preterm Abortions TAB SAB Ect Mult Living                  Review of Systems  Constitutional: Negative for activity change.  HENT: Negative for facial swelling and neck pain.   Respiratory: Negative for cough, shortness of breath and wheezing.   Cardiovascular: Negative for chest pain.  Gastrointestinal: Positive for abdominal pain. Negative for nausea, vomiting, diarrhea, constipation, blood in stool and abdominal distention.  Genitourinary: Negative for hematuria and difficulty urinating.  Skin: Negative for color change.  Neurological: Negative for speech  difficulty.  Hematological: Does not bruise/bleed easily.  Psychiatric/Behavioral: Negative for confusion.    Allergies  Review of patient's allergies indicates no known allergies.  Home Medications   Current Outpatient Rx  Name  Route  Sig  Dispense  Refill  . acyclovir (ZOVIRAX) 200 MG capsule   Oral   Take 1 capsule (200 mg total) by mouth 2 (two) times daily.   180 capsule   2   . albuterol (PROVENTIL HFA;VENTOLIN HFA) 108 (90 BASE) MCG/ACT inhaler   Inhalation   Inhale 1 puff into the lungs every 4 (four) hours as needed for wheezing. 1-2 puffs po bid prn asthma, as directed by pulmonolgy   3 Inhaler   2   . amphetamine-dextroamphetamine (ADDERALL) 20 MG tablet   Oral   Take 1 tablet (20 mg total) by mouth 2 (two) times daily. April 2014 rx   60 tablet   0   . budesonide-formoterol (SYMBICORT) 160-4.5 MCG/ACT inhaler      1-2 puffs po bid prn asthma flare as directed by pulmonology   3 Inhaler   2   . EPINEPHrine (EPI-PEN) 0.3 mg/0.3 mL DEVI   Intramuscular   Inject 0.3 mLs (0.3 mg total) into the muscle as needed.   2 Device   1   . HYDROcodone-acetaminophen (VICODIN) 5-500 MG per tablet   Oral   Take 1 tablet by mouth every 8 (eight) hours as needed for pain.   30 tablet   2   . levothyroxine (SYNTHROID, LEVOTHROID) 137 MCG tablet   Oral   Take 1 tablet (137 mcg total) by mouth daily. Has to be Synthroid   90 tablet   1   . lidocaine (XYLOCAINE JELLY) 2 % jelly      Apply to affected area daily prn   30 mL   1   . nebivolol (BYSTOLIC) 5 MG tablet   Oral   Take 1 tablet (5 mg total) by mouth daily.   90 tablet   2   . niacin 500 MG CR capsule      1-2 tabs po at bedtime and take a lowfat snack and 81 mg aspirin 1/2 before         . nitrofurantoin, macrocrystal-monohydrate, (MACROBID) 100 MG capsule      1 tab po qd x 1 dose prn for prevention of UTI (after self cath, after swimming or hot tub use, after intercourse)   90 capsule   2    . Norethindrone Acetate-Ethinyl Estrad-FE (LOESTRIN 24 FE) 1-20 MG-MCG(24) tablet   Oral   Take 1 tablet by mouth daily.   3 Package   2     BP 144/92  Pulse 83  Temp(Src) 98.2 F (36.8 C) (Oral)  SpO2 100%  LMP 03/12/2012  Physical Exam  Nursing note and vitals reviewed. Constitutional: She is oriented to person, place, and time. She  appears well-developed and well-nourished.  HENT:  Head: Normocephalic and atraumatic.  Eyes: EOM are normal. Pupils are equal, round, and reactive to light.  Neck: Neck supple.  Cardiovascular: Normal rate, regular rhythm and normal heart sounds.   No murmur heard. Pulmonary/Chest: Effort normal. No respiratory distress.  Abdominal: Soft. She exhibits no distension. There is tenderness. There is guarding. There is no rebound.  RUQ tenderness, with guarding, but neg murphys, no flank tenderness, pt has diffuse abd tenderness in the upper quadrants, but RUQ is the worst.  Neurological: She is alert and oriented to person, place, and time.  Skin: Skin is warm and dry.    ED Course  Procedures (including critical care time)  Labs Reviewed  URINALYSIS, MICROSCOPIC ONLY - Abnormal; Notable for the following:    Hgb urine dipstick SMALL (*)    Leukocytes, UA SMALL (*)    All other components within normal limits  CBC WITH DIFFERENTIAL  LIPASE, BLOOD  HEPATIC FUNCTION PANEL  BASIC METABOLIC PANEL  PREGNANCY, URINE  URINALYSIS, ROUTINE W REFLEX MICROSCOPIC   No results found.   No diagnosis found.    MDM  DDx includes: Pancreatitis Hepatobiliary pathology including cholecystitis Gastritis/PUD SBO ACS syndrome Aortic Dissection Nephrolithiasis Pyelonephritis  Pt comes in with cc of abd pain. RUQ, constant, worse with movement, with some guarding on exam. No pelvic pain, no lower quadrant tenderness. Pt has some nausea, anorexia. Will start off with [place] to ensure there is no hepatobiliary pathology.  12:55 AM Pt's lab results  are normal, besides slight leukocytosis. Exam is still showing tenderness to palpation. Will get CT - to ensure there is no infection. If the CT is negative -GI f.u for HIDA.   Derwood Kaplan, MD 04/12/12 [phone]

## 2012-04-11 NOTE — ED Notes (Signed)
[id number]<AV> Expected date:<BR> Expected time:<BR> Means of arrival:<BR> Comments:<BR> Hold for triage 1

## 2012-04-11 NOTE — ED Notes (Signed)
Pt states that she started having abdominal pain yesterday and today it is focalized in her RUQ. Nausea. Denies v/d.

## 2012-04-12 ENCOUNTER — Emergency Department (HOSPITAL_COMMUNITY): Payer: Managed Care, Other (non HMO)

## 2012-04-12 LAB — POCT PREGNANCY, URINE: Preg Test, Ur: NEGATIVE

## 2012-04-12 MED ORDER — IOHEXOL 300 MG/ML  SOLN
50.0000 mL | Freq: Once | INTRAMUSCULAR | Status: AC | PRN
  Administered 2012-04-12: 50 mL via ORAL

## 2012-04-12 MED ORDER — METRONIDAZOLE IN NACL 5-0.79 MG/ML-% IV SOLN
500.0000 mg | Freq: Once | INTRAVENOUS | Status: AC
  Administered 2012-04-12: 500 mg via INTRAVENOUS
  Filled 2012-04-12: qty 100

## 2012-04-12 MED ORDER — HYDROCODONE-ACETAMINOPHEN 5-500 MG PO TABS: 1.0000 | ORAL_TABLET | Freq: Three times a day (TID) | ORAL | Status: DC | PRN

## 2012-04-12 MED ORDER — CIPROFLOXACIN IN D5W 400 MG/200ML IV SOLN
400.0000 mg | Freq: Once | INTRAVENOUS | Status: AC
  Administered 2012-04-12: 400 mg via INTRAVENOUS
  Filled 2012-04-12: qty 200

## 2012-04-12 MED ORDER — MORPHINE SULFATE 4 MG/ML IJ SOLN
4.0000 mg | Freq: Once | INTRAMUSCULAR | Status: AC
  Administered 2012-04-12: 4 mg via INTRAVENOUS

## 2012-04-12 MED ORDER — OXYCODONE-ACETAMINOPHEN 5-325 MG PO TABS
2.0000 | ORAL_TABLET | Freq: Once | ORAL | Status: DC
  Filled 2012-04-12: qty 2

## 2012-04-12 MED ORDER — CIPROFLOXACIN HCL 500 MG PO TABS: 500.0000 mg | ORAL_TABLET | Freq: Two times a day (BID) | ORAL | Status: DC

## 2012-04-12 MED ORDER — ONDANSETRON HCL 4 MG PO TABS: 4.0000 mg | ORAL_TABLET | Freq: Four times a day (QID) | ORAL | Status: DC

## 2012-04-12 MED ORDER — METRONIDAZOLE 500 MG PO TABS: 500.0000 mg | ORAL_TABLET | Freq: Two times a day (BID) | ORAL | Status: DC

## 2012-04-12 MED ORDER — IOHEXOL 300 MG/ML  SOLN
100.0000 mL | Freq: Once | INTRAMUSCULAR | Status: AC | PRN
  Administered 2012-04-12: 100 mL via INTRAVENOUS

## 2012-04-12 MED ORDER — ONDANSETRON HCL 4 MG/2ML IJ SOLN
4.0000 mg | Freq: Once | INTRAMUSCULAR | Status: AC
  Administered 2012-04-12: 4 mg via INTRAVENOUS
  Filled 2012-04-12: qty 2

## 2012-04-12 MED ORDER — HYDROCODONE-ACETAMINOPHEN 5-325 MG PO TABS: 2.0000 | ORAL_TABLET | Freq: Once | ORAL | Status: DC

## 2012-04-12 NOTE — ED Provider Notes (Signed)
Care assumed from Dr Rhunette Croft.  Pt with RUQ pain, negative u/s.  Concern for infectious etiology, abs started, CT pending.  Results for orders placed during the hospital encounter of 04/11/12  CBC WITH DIFFERENTIAL      Result Value Range   WBC 14.5 (*) 4.0 - 10.5 K/uL   RBC 4.05  3.87 - 5.11 MIL/uL   Hemoglobin 12.9  12.0 - 15.0 g/dL   HCT 16.1  09.6 - 04.5 %   MCV 92.8  78.0 - 100.0 fL   MCH 31.9  26.0 - 34.0 pg   MCHC 34.3  30.0 - 36.0 g/dL   RDW 40.9  81.1 - 91.4 %   Platelets 198  150 - 400 K/uL   Neutrophils Relative 63  43 - 77 %   Neutro Abs 9.2 (*) 1.7 - 7.7 K/uL   Lymphocytes Relative 28  12 - 46 %   Lymphs Abs 4.1 (*) 0.7 - 4.0 K/uL   Monocytes Relative 8  3 - 12 %   Monocytes Absolute 1.2 (*) 0.1 - 1.0 K/uL   Eosinophils Relative 1  0 - 5 %   Eosinophils Absolute 0.1  0.0 - 0.7 K/uL   Basophils Relative 0  0 - 1 %   Basophils Absolute 0.0  0.0 - 0.1 K/uL  LIPASE, BLOOD      Result Value Range   Lipase 38  11 - 59 U/L  URINALYSIS, MICROSCOPIC ONLY      Result Value Range   Color, Urine YELLOW  YELLOW   APPearance CLEAR  CLEAR   Specific Gravity, Urine 1.022  1.005 - 1.030   pH 7.0  5.0 - 8.0   Glucose, UA NEGATIVE  NEGATIVE mg/dL   Hgb urine dipstick SMALL (*) NEGATIVE   Bilirubin Urine NEGATIVE  NEGATIVE   Ketones, ur NEGATIVE  NEGATIVE mg/dL   Protein, ur NEGATIVE  NEGATIVE mg/dL   Urobilinogen, UA 1.0  0.0 - 1.0 mg/dL   Nitrite NEGATIVE  NEGATIVE   Leukocytes, UA SMALL (*) NEGATIVE   WBC, UA 0-2  <3 WBC/hpf   RBC / HPF 0-2  <3 RBC/hpf   Squamous Epithelial / LPF RARE  RARE  HEPATIC FUNCTION PANEL      Result Value Range   Total Protein 7.1  6.0 - 8.3 g/dL   Albumin 3.2 (*) 3.5 - 5.2 g/dL   AST 14  0 - 37 U/L   ALT 10  0 - 35 U/L   Alkaline Phosphatase 50  39 - 117 U/L   Total Bilirubin 0.3  0.3 - 1.2 mg/dL   Bilirubin, Direct <7.8  0.0 - 0.3 mg/dL   Indirect Bilirubin NOT CALCULATED  0.3 - 0.9 mg/dL  BASIC METABOLIC PANEL      Result Value Range    Sodium 137  135 - 145 mEq/L   Potassium 3.6  3.5 - 5.1 mEq/L   Chloride 101  96 - 112 mEq/L   CO2 26  19 - 32 mEq/L   Glucose, Bld 110 (*) 70 - 99 mg/dL   BUN 10  6 - 23 mg/dL   Creatinine, Ser 2.95  0.50 - 1.10 mg/dL   Calcium 9.2  8.4 - 62.1 mg/dL   GFR calc non Af Amer >90  >90 mL/min   GFR calc Af Amer >90  >90 mL/min  PREGNANCY, URINE      Result Value Range   Preg Test, Ur NEGATIVE  NEGATIVE   US Abdomen Complete  04/11/2012  *  RADIOLOGY REPORT*  Clinical Data:  Abdominal pain.  Right upper quadrant pain for 2 days.  COMPLETE ABDOMINAL ULTRASOUND  Comparison:  CT abdomen and pelvis 02/11/2011  Findings:  Gallbladder:  No gallstones, gallbladder wall thickening, or pericholecystic fluid.  Common bile duct:  Normal caliber, measured at 4.2 mm diameter.  Liver:  No focal lesion identified.  Within normal limits in parenchymal echogenicity.  IVC:  Appears normal.  Pancreas:  Visualized portion of the body of the pancreas is unremarkable.  The head and tail are obscured by bowel gas.  Spleen:  Spleen length measures 7.3 cm.  Normal parenchymal echotexture.  Right Kidney:  Right kidney measures 10.6 cm length.  No hydronephrosis.  Left Kidney:  Left kidney measures 10.8 cm length.  No hydronephrosis.  Abdominal aorta:  Incomplete visualization due to overlying bowel gas.  Visualized proximal abdominal aorta is normal in caliber.  IMPRESSION: Negative abdominal ultrasound.   Original Report Authenticated By: Burman Nieves, M.D.    Ct Abdomen Pelvis W Contrast  04/12/2012  *RADIOLOGY REPORT*  Clinical Data: Right upper quadrant tenderness and guarding.  Right lower quadrant pain.  Nausea.  White cell count 14.5.  CT ABDOMEN AND PELVIS WITH CONTRAST  Technique:  Multidetector CT imaging of the abdomen and pelvis was performed following the standard protocol during bolus administration of intravenous contrast.  Contrast: OMNIPAQUE IOHEXOL 300 MG/ML  SOLN  Comparison: 02/11/2011  Findings:  Minimal dependent changes in the lung bases.  The liver, spleen, gallbladder, pancreas, adrenal glands, kidneys, abdominal aorta, inferior vena cava, and retroperitoneal lymph nodes are unremarkable.  Stomach and small bowel are normally opacified without wall thickening or distension.  No free air or free fluid in the abdomen.  Pelvis:  Diffusely stool filled colon.  There is inflammatory infiltration around the upper ascending colon and hepatic flexure. The appendix is present inferior to this area and is normal. A prominent diverticulum is noted centrally within the area of inflammation.  Inflammatory process is likely due to right-sided colonic diverticulitis or less likely a focal colitis.  No abscess is demonstrated.  No extraluminal gas collections.  The uterus and ovaries are not enlarged.  The bladder wall is not thickened.  No free or loculated pelvic fluid collections.  The sigmoid colon is stool-filled and normal.  No significant pelvic lymphadenopathy.  Normal alignment of the lumbar vertebrae.  IMPRESSION: Pericolonic inflammatory stranding at the hepatic flexure, likely to represent right-sided colonic diverticulitis.  No evidence of abscess.   Original Report Authenticated By: Burman Nieves, M.D.    Dg Abd Acute W/chest  04/11/2012  *RADIOLOGY REPORT*  Clinical Data: Abdominal pain  ACUTE ABDOMEN SERIES (ABDOMEN 2 VIEW & CHEST 1 VIEW)  Comparison: 05/13/2007 and 08/05/2004  Findings: Normal heart size.  Clear lungs.  Stable bronchitic changes.  No disproportionate dilatation of bowel.  No free intraperitoneal gas.  Probable phleboliths project over the pelvis.  IMPRESSION: Nonobstructive bowel gas pattern.   Original Report Authenticated By: Jolaine Click, M.D.      Right sided diverticulitis noted.  Pt reports mother with similar.  Will d/c home with f/u with GI, pain/nausea medications, abx.  Olivia Mackie, MD 04/12/12 [phone]

## 2012-04-20 ENCOUNTER — Other Ambulatory Visit: Payer: Self-pay | Admitting: Family Medicine

## 2012-04-20 NOTE — Telephone Encounter (Signed)
Minastrin request as a sub for Loestrin [Last Loestrin Rx 09.23.13 #3 pkgx2]/SLS Please advise.

## 2012-04-23 ENCOUNTER — Ambulatory Visit (INDEPENDENT_AMBULATORY_CARE_PROVIDER_SITE_OTHER): Payer: Managed Care, Other (non HMO) | Admitting: Family

## 2012-04-23 ENCOUNTER — Encounter: Payer: Self-pay | Admitting: Family

## 2012-04-23 ENCOUNTER — Telehealth: Payer: Self-pay | Admitting: *Deleted

## 2012-04-23 VITALS — BP 110/88 | HR 77 | Temp 98.6°F | Resp 16 | Wt 144.0 lb

## 2012-04-23 DIAGNOSIS — K5732 Diverticulitis of large intestine without perforation or abscess without bleeding: Secondary | ICD-10-CM

## 2012-04-23 DIAGNOSIS — K5792 Diverticulitis of intestine, part unspecified, without perforation or abscess without bleeding: Secondary | ICD-10-CM | POA: Insufficient documentation

## 2012-04-23 MED ORDER — METRONIDAZOLE 500 MG PO TABS: 500.0000 mg | ORAL_TABLET | Freq: Two times a day (BID) | ORAL | Status: DC

## 2012-04-23 MED ORDER — CIPROFLOXACIN HCL 500 MG PO TABS: 500.0000 mg | ORAL_TABLET | Freq: Two times a day (BID) | ORAL | Status: DC

## 2012-04-23 NOTE — Patient Instructions (Signed)
Continue cipro/flagyl. Call if increased pain, fever, or if vomitting.  Follow up in 1 week.

## 2012-04-23 NOTE — Progress Notes (Signed)
Kristen [name] is a 37 yr old female who presents today for ED follow up. Records are reviewed. She was seen in the ED on 3/30 with chief complaint of abdominal pain.  She underwent CT abdomen which was consistent with Diverticulitis and was sent home with oral antibiotics.  She was treated with flagyl/cipro x 7 days. Completed on Monday AM.  She had vomiting until Thursday.  Then resolved. She had cipro from family member on which she restarted on Thursday.  Reports loose stools. Denies black/bloody stools. + fatigue.     Past Medical History  Diagnosis Date  . Thyroid disease   . Hypertension   . ADD (attention deficit disorder)   . Fibromyalgia   . Chicken pox as a child  . Allergy     seasonal  . Asthma     mild, intermittent  . Recurrent UTI   . Anemia     blood loss  . Hyperlipidemia   . Depression 2002    post partum  . Renal lithiasis 09/24/2010  . Concussion 09/24/2010  . History of concussion 09/24/2010  . 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 Female bladder prolapse, acquired 09/24/2010  . HTN (hypertension) 09/24/2010  . Subacute and chronic vaginitis 09/24/2010  . Lymphadenopathy 09/24/2010  . Migraine aura, persistent, with cerebral infarct, status over 72 hours 09/24/2010  . History of migraines 09/24/2010  . Chest pain, atypical 10/08/2010  . Neck pain, musculoskeletal 12/16/2010  . Poor concentration 12/16/2010  . Bronchitis, acute 12/16/2010  . Contraceptive management 02/17/2011  . Interstitial cystitis 02/18/2011  . RUQ pain 05/27/2011  . Decreased libido 09/26/2011  . Migraine 09/24/2010  . Overweight 12/26/2011  . Abnormal cervical cytology 09/26/2011    LGSIL in 2011, patient reports repeat pap was normal, has had intermittent abnormals with bx in past but always normalized, has had cryotherapy with good results Menarche at 17, irregular without birth control at times, very heavy LMP 08/26/2011 No gyn concerns, MGM last year normal     History   Social History  . Marital Status:  Married    Spouse Name: N/A    Number of Children: N/A  . Years of Education: N/A   Occupational History  . Not on file.   Social History Main Topics  . Smoking status: Never Smoker   . Smokeless tobacco: Never Used  . Alcohol Use: Yes     Comment: 3 drinks weekly  . Drug Use: No  . Sexually Active: Yes -- Female partner(s)   Other Topics Concern  . Not on file   Social History Narrative  . No narrative on file    Past Surgical History  Procedure Laterality Date  . Incontinence surgery  2003    Family History  Problem Relation Age of Onset  . Heart attack Father   . Hyperlipidemia Father   . Hypertension Father   . Alcohol abuse Father   . Heart disease Father   . Heart attack Brother 45  . Hypertension Brother   . Heart disease Brother 45    MI  . Dementia Maternal Grandfather 22    early stages  . Heart attack Paternal Grandfather   . Stroke Paternal Grandfather   . Heart disease Paternal Grandfather   . Alcohol abuse Paternal Grandfather   . Hyperlipidemia Paternal Grandfather   . Hypertension Paternal Grandfather     Allergies  Allergen Reactions  . Terazol (Terconazole)     Burning sensation    Current Outpatient Prescriptions on File  Prior to Visit  Medication Sig Dispense Refill  . acyclovir (ZOVIRAX) 200 MG capsule Take 1 capsule (200 mg total) by mouth 2 (two) times daily.  180 capsule  2  . albuterol (PROVENTIL HFA;VENTOLIN HFA) 108 (90 BASE) MCG/ACT inhaler Inhale 1 puff into the lungs every 4 (four) hours as needed for wheezing. 1-2 puffs po bid prn asthma, as directed by pulmonolgy  3 Inhaler  2  . amphetamine-dextroamphetamine (ADDERALL) 20 MG tablet Take 1 tablet (20 mg total) by mouth 2 (two) times daily. April 2014 rx  60 tablet  0  . budesonide-formoterol (SYMBICORT) 160-4.5 MCG/ACT inhaler 1-2 puffs po bid prn asthma flare as directed by pulmonology  3 Inhaler  2  . EPINEPHrine (EPI-PEN) 0.3 mg/0.3 mL DEVI Inject 0.3 mLs (0.3 mg total)  into the muscle as needed.  2 Device  1  . HYDROcodone-acetaminophen (VICODIN) 5-500 MG per tablet Take 1 tablet by mouth every 8 (eight) hours as needed for pain.  30 tablet  0  . levothyroxine (SYNTHROID, LEVOTHROID) 137 MCG tablet Take 1 tablet (137 mcg total) by mouth daily. Has to be Synthroid  90 tablet  1  . lidocaine (XYLOCAINE JELLY) 2 % jelly Apply to affected area daily prn  30 mL  1  . MINASTRIN 24 FE 1-20 MG-MCG(24) CHEW TAKE 1 TABLET BY MOUTH EVERY DAY *THIS IS SUB FOR LOESTRIN*  84 tablet  0  . nebivolol (BYSTOLIC) 5 MG tablet Take 1 tablet (5 mg total) by mouth daily.  90 tablet  2  . niacin 500 MG CR capsule 1-2 tabs po at bedtime and take a lowfat snack and 81 mg aspirin 1/2 before      . nitrofurantoin, macrocrystal-monohydrate, (MACROBID) 100 MG capsule 1 tab po qd x 1 dose prn for prevention of UTI (after self cath, after swimming or hot tub use, after intercourse)  90 capsule  2  . Norethindrone Acetate-Ethinyl Estrad-FE (LOESTRIN 24 FE) 1-20 MG-MCG(24) tablet Take 1 tablet by mouth daily.  3 Package  2  . ondansetron (ZOFRAN) 4 MG tablet Take 1 tablet (4 mg total) by mouth every 6 (six) hours.  12 tablet  0   No current facility-administered medications on file prior to visit.    BP 110/88  Pulse 77  Temp(Src) 98.6 F (37 C) (Oral)  Resp 16  Wt 144 lb 0.6 oz (65.336 kg)  BMI 25.71 kg/m2  SpO2 99%  LMP 04/21/2012   Gen: awake, alert, NAD CV: S1/S1, RRR Resp: BS CTA bilaterally no w/r/r Abdomen: Soft, normoactive BS.  Mild R lateral mid abdominal tenderness without rebound or guarding.   Psych: a and O x 3, calm and pleasant Ext: no edema noted

## 2012-04-23 NOTE — Assessment & Plan Note (Addendum)
Declines CT due to cost. We discussed risk of worsening colitis, abscess/bowel perforation and that this can best be evaluated with CT. She reports understanding but still wishes to continue with abx and monitoring for now due to cost. She is agreeable to GI referral.

## 2012-04-23 NOTE — Telephone Encounter (Signed)
Rxs for metronidazole and cipro printed instead of transmitting electronically. Rx called to [facility] at [facility] on [address]. Metronidazole directions changed from 1 twice a day to 1 three times a day with same quantity of 21 per verbal from Provider.

## 2012-05-05 ENCOUNTER — Encounter: Payer: Self-pay | Admitting: Family

## 2012-05-05 ENCOUNTER — Ambulatory Visit (INDEPENDENT_AMBULATORY_CARE_PROVIDER_SITE_OTHER): Payer: Managed Care, Other (non HMO) | Admitting: Family

## 2012-05-05 VITALS — BP 110/82 | HR 72 | Temp 98.6°F | Resp 16 | Wt 148.1 lb

## 2012-05-05 DIAGNOSIS — R300 Dysuria: Secondary | ICD-10-CM

## 2012-05-05 DIAGNOSIS — R3129 Other microscopic hematuria: Secondary | ICD-10-CM

## 2012-05-05 DIAGNOSIS — N898 Other specified noninflammatory disorders of vagina: Secondary | ICD-10-CM

## 2012-05-05 DIAGNOSIS — N3010 Interstitial cystitis (chronic) without hematuria: Secondary | ICD-10-CM

## 2012-05-05 DIAGNOSIS — K5792 Diverticulitis of intestine, part unspecified, without perforation or abscess without bleeding: Secondary | ICD-10-CM

## 2012-05-05 DIAGNOSIS — K5732 Diverticulitis of large intestine without perforation or abscess without bleeding: Secondary | ICD-10-CM

## 2012-05-05 LAB — POCT URINALYSIS DIPSTICK
Bilirubin, UA: NEGATIVE
Glucose, UA: NEGATIVE
Ketones, UA: NEGATIVE
Spec Grav, UA: <=1.005
pH, UA: 7

## 2012-05-05 MED ORDER — PENTOSAN POLYSULFATE SODIUM 100 MG PO CAPS: 100.0000 mg | ORAL_CAPSULE | Freq: Three times a day (TID) | ORAL | Status: DC

## 2012-05-05 NOTE — Progress Notes (Signed)
Subjective:    Patient ID: Kristen [name], female    DOB: 06-Sep-1975, 37 y.o.   MRN: [id number]  HPI  [name] is a 37 yr old female who presents today for follow up of her diverticulitis.  She completed antibiotics. Reports symptoms are very much improved.  Last Saturday night she had pain until 4AM- right sided. This resolved.  She reports that she has had some loose stools but not diarrhea. Happens after food.    Vaginal Discharge- She reports that she has chronic "burning" due to IC.  Reports some vaginal odor.  Mild breakthrough bleeding.  She has previous hx of chlamydia-    Review of Systems See HPI  Past Medical History  Diagnosis Date  . Thyroid disease   . Hypertension   . ADD (attention deficit disorder)   . Fibromyalgia   . Chicken pox as a child  . Allergy     seasonal  . Asthma     mild, intermittent  . Recurrent UTI   . Anemia     blood loss  . Hyperlipidemia   . Depression 2002    post partum  . Renal lithiasis 09/24/2010  . Concussion 09/24/2010  . History of concussion 09/24/2010  . 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 Female bladder prolapse, acquired 09/24/2010  . HTN (hypertension) 09/24/2010  . Subacute and chronic vaginitis 09/24/2010  . Lymphadenopathy 09/24/2010  . Migraine aura, persistent, with cerebral infarct, status over 72 hours 09/24/2010  . History of migraines 09/24/2010  . Chest pain, atypical 10/08/2010  . Neck pain, musculoskeletal 12/16/2010  . Poor concentration 12/16/2010  . Bronchitis, acute 12/16/2010  . Contraceptive management 02/17/2011  . Interstitial cystitis 02/18/2011  . RUQ pain 05/27/2011  . Decreased libido 09/26/2011  . Migraine 09/24/2010  . Overweight 12/26/2011  . Abnormal cervical cytology 09/26/2011    LGSIL in 2011, patient reports repeat pap was normal, has had intermittent abnormals with bx in past but always normalized, has had cryotherapy with good results Menarche at 17, irregular without birth control at times, very heavy LMP  08/26/2011 No gyn concerns, MGM last year normal     History   Social History  . Marital Status: Married    Spouse Name: N/A    Number of Children: N/A  . Years of Education: N/A   Occupational History  . Not on file.   Social History Main Topics  . Smoking status: Never Smoker   . Smokeless tobacco: Never Used  . Alcohol Use: Yes     Comment: 3 drinks weekly  . Drug Use: No  . Sexually Active: Yes -- Female partner(s)   Other Topics Concern  . Not on file   Social History Narrative  . No narrative on file    Past Surgical History  Procedure Laterality Date  . Incontinence surgery  2003    Family History  Problem Relation Age of Onset  . Heart attack Father   . Hyperlipidemia Father   . Hypertension Father   . Alcohol abuse Father   . Heart disease Father   . Heart attack Brother 45  . Hypertension Brother   . Heart disease Brother 45    MI  . Dementia Maternal Grandfather 74    early stages  . Heart attack Paternal Grandfather   . Stroke Paternal Grandfather   . Heart disease Paternal Grandfather   . Alcohol abuse Paternal Grandfather   . Hyperlipidemia Paternal Grandfather   . Hypertension Paternal Grandfather  Allergies  Allergen Reactions  . Terazol (Terconazole)     Burning sensation    Current Outpatient Prescriptions on File Prior to Visit  Medication Sig Dispense Refill  . acyclovir (ZOVIRAX) 200 MG capsule Take 1 capsule (200 mg total) by mouth 2 (two) times daily.  180 capsule  2  . albuterol (PROVENTIL HFA;VENTOLIN HFA) 108 (90 BASE) MCG/ACT inhaler Inhale 1 puff into the lungs every 4 (four) hours as needed for wheezing. 1-2 puffs po bid prn asthma, as directed by pulmonolgy  3 Inhaler  2  . amphetamine-dextroamphetamine (ADDERALL) 20 MG tablet Take 1 tablet (20 mg total) by mouth 2 (two) times daily. April 2014 rx  60 tablet  0  . budesonide-formoterol (SYMBICORT) 160-4.5 MCG/ACT inhaler 1-2 puffs po bid prn asthma flare as directed by  pulmonology  3 Inhaler  2  . EPINEPHrine (EPI-PEN) 0.3 mg/0.3 mL DEVI Inject 0.3 mLs (0.3 mg total) into the muscle as needed.  2 Device  1  . HYDROcodone-acetaminophen (VICODIN) 5-500 MG per tablet Take 1 tablet by mouth every 8 (eight) hours as needed for pain.  30 tablet  0  . levothyroxine (SYNTHROID, LEVOTHROID) 137 MCG tablet Take 1 tablet (137 mcg total) by mouth daily. Has to be Synthroid  90 tablet  1  . lidocaine (XYLOCAINE JELLY) 2 % jelly Apply to affected area daily prn  30 mL  1  . MINASTRIN 24 FE 1-20 MG-MCG(24) CHEW TAKE 1 TABLET BY MOUTH EVERY DAY *THIS IS SUB FOR LOESTRIN*  84 tablet  0  . nebivolol (BYSTOLIC) 5 MG tablet Take 1 tablet (5 mg total) by mouth daily.  90 tablet  2  . niacin 500 MG CR capsule 1-2 tabs po at bedtime and take a lowfat snack and 81 mg aspirin 1/2 before      . nitrofurantoin, macrocrystal-monohydrate, (MACROBID) 100 MG capsule 1 tab po qd x 1 dose prn for prevention of UTI (after self cath, after swimming or hot tub use, after intercourse)  90 capsule  2  . Norethindrone Acetate-Ethinyl Estrad-FE (LOESTRIN 24 FE) 1-20 MG-MCG(24) tablet Take 1 tablet by mouth daily.  3 Package  2  . ondansetron (ZOFRAN) 4 MG tablet Take 1 tablet (4 mg total) by mouth every 6 (six) hours.  12 tablet  0   No current facility-administered medications on file prior to visit.    BP 110/82  Pulse 72  Temp(Src) 98.6 F (37 C) (Oral)  Resp 16  Wt 148 lb 1.9 oz (67.187 kg)  BMI 26.44 kg/m2  SpO2 99%  LMP 04/21/2012       Objective:   Physical Exam  Constitutional: She is oriented to person, place, and time. She appears well-developed and well-nourished. No distress.  HENT:  Head: Normocephalic and atraumatic.  Cardiovascular: Normal rate and regular rhythm.   No murmur heard. Pulmonary/Chest: Effort normal and breath sounds normal. No respiratory distress. She has no wheezes. She has no rales. She exhibits no tenderness.  Musculoskeletal: She exhibits no edema.   Neurological: She is alert and oriented to person, place, and time.  Psychiatric: She has a normal mood and affect. Her behavior is normal. Judgment and thought content normal.          Assessment & Plan:

## 2012-05-05 NOTE — Patient Instructions (Addendum)
We will contact you with your results. Please follow up 3 months- sooner if problems or concerns.

## 2012-05-06 LAB — WET PREP BY MOLECULAR PROBE
Candida species: NEGATIVE
Gardnerella vaginalis: NEGATIVE

## 2012-05-06 LAB — URINE CULTURE: Organism ID, Bacteria: NO GROWTH

## 2012-05-06 LAB — URINALYSIS, ROUTINE W REFLEX MICROSCOPIC
Glucose, UA: NEGATIVE mg/dL
Hgb urine dipstick: NEGATIVE
Leukocytes, UA: NEGATIVE
Nitrite: NEGATIVE
Specific Gravity, Urine: 1.015 (ref 1.005–1.030)
pH: 7 (ref 5.0–8.0)

## 2012-05-06 LAB — GC/CHLAMYDIA PROBE AMP: CT Probe RNA: NEGATIVE

## 2012-05-08 NOTE — Assessment & Plan Note (Signed)
Wet prep, GC/Chlamycia, urine culture- all normal.  Symptoms Likely due to IC. Refill sent for elmiron.

## 2012-05-08 NOTE — Assessment & Plan Note (Signed)
Resolved

## 2012-05-11 ENCOUNTER — Other Ambulatory Visit: Payer: Self-pay | Admitting: Gastroenterology

## 2012-05-11 DIAGNOSIS — R112 Nausea with vomiting, unspecified: Secondary | ICD-10-CM

## 2012-05-25 ENCOUNTER — Encounter (HOSPITAL_COMMUNITY)
Admission: RE | Admit: 2012-05-25 | Discharge: 2012-05-25 | Disposition: A | Discharge status: Home / Self Care | Payer: Managed Care, Other (non HMO) | Source: Ambulatory Visit | Attending: Gastroenterology | Admitting: Gastroenterology

## 2012-05-25 DIAGNOSIS — R112 Nausea with vomiting, unspecified: Secondary | ICD-10-CM | POA: Insufficient documentation

## 2012-05-25 MED ORDER — SINCALIDE 5 MCG IJ SOLR: 0.0200 ug/kg | Freq: Once | INTRAMUSCULAR | Status: AC

## 2012-05-25 MED ORDER — TECHNETIUM TC 99M MEBROFENIN IV KIT
5.0000 | PACK | Freq: Once | INTRAVENOUS | Status: AC | PRN
  Administered 2012-05-25: 5 via INTRAVENOUS

## 2012-05-25 MED ORDER — SINCALIDE 5 MCG IJ SOLR
INTRAMUSCULAR | Status: AC
  Administered 2012-05-25: 1.35 ug via INTRAVENOUS
  Filled 2012-05-25: qty 5

## 2012-06-02 ENCOUNTER — Telehealth: Payer: Self-pay | Admitting: Family

## 2012-06-02 DIAGNOSIS — F909 Attention-deficit hyperactivity disorder, unspecified type: Secondary | ICD-10-CM

## 2012-06-02 MED ORDER — AMPHETAMINE-DEXTROAMPHETAMINE 20 MG PO TABS: ORAL_TABLET | ORAL | Status: DC

## 2012-06-02 NOTE — Telephone Encounter (Signed)
Pt presented at her daughter's apt and request refill of adderall. Refill provided.

## 2012-06-16 ENCOUNTER — Other Ambulatory Visit: Payer: BC Managed Care – PPO

## 2012-06-22 ENCOUNTER — Ambulatory Visit: Payer: BC Managed Care – PPO | Admitting: Family Medicine

## 2012-06-22 ENCOUNTER — Ambulatory Visit: Payer: Self-pay | Admitting: Family Medicine

## 2012-07-02 ENCOUNTER — Other Ambulatory Visit: Payer: Self-pay | Admitting: Family Medicine

## 2012-07-06 ENCOUNTER — Telehealth: Payer: Self-pay | Admitting: Family Medicine

## 2012-07-06 NOTE — Telephone Encounter (Signed)
Please advise 

## 2012-07-06 NOTE — Telephone Encounter (Signed)
Patient states that her tonsils are bothering her again. She has puss filled pockets on them. Patient states that she has seen Dr. Abner Greenspan before regarding this and dr. Abner Greenspan prescribed her an antibiotic and mouthwash for this. Kristen [name] would like to know if Dr. Abner Greenspan would prescribe her the antibiotic and mouthwash again w/o being seen? [facility] on [address].

## 2012-07-06 NOTE — Telephone Encounter (Signed)
She can have a refill on a Zpak and some Nystatin liquid swish and spit qid prn pain, pharnygitis, disp #1 bottle, if noimprovement then needs to come back in

## 2012-07-07 MED ORDER — AZITHROMYCIN 250 MG PO TABS: 250.0000 mg | ORAL_TABLET | Freq: Every day | ORAL | Status: DC

## 2012-07-07 MED ORDER — NYSTATIN 100000 UNIT/ML MT SUSP: 500000.0000 [IU] | Freq: Four times a day (QID) | OROMUCOSAL | Status: DC | PRN

## 2012-07-07 NOTE — Telephone Encounter (Signed)
RX sent and a detailed message left on patients vm

## 2012-08-04 ENCOUNTER — Ambulatory Visit: Payer: Managed Care, Other (non HMO) | Admitting: Family

## 2012-08-26 ENCOUNTER — Other Ambulatory Visit: Payer: Self-pay

## 2012-08-26 DIAGNOSIS — F909 Attention-deficit hyperactivity disorder, unspecified type: Secondary | ICD-10-CM

## 2012-08-26 MED ORDER — AMPHETAMINE-DEXTROAMPHETAMINE 20 MG PO TABS: 20.0000 mg | ORAL_TABLET | Freq: Two times a day (BID) | ORAL | Status: DC

## 2012-08-26 MED ORDER — AMPHETAMINE-DEXTROAMPHETAMINE 20 MG PO TABS: ORAL_TABLET | ORAL | Status: DC

## 2012-08-26 NOTE — Telephone Encounter (Signed)
Pt was in today with daughter. Verbal per MD print 3 months of Adderall

## 2012-09-07 ENCOUNTER — Ambulatory Visit (INDEPENDENT_AMBULATORY_CARE_PROVIDER_SITE_OTHER): Payer: Managed Care, Other (non HMO) | Admitting: Family

## 2012-09-07 ENCOUNTER — Encounter: Payer: Self-pay | Admitting: Family

## 2012-09-07 VITALS — BP 122/80 | HR 83 | Temp 98.5°F | Resp 16 | Wt 147.0 lb

## 2012-09-07 DIAGNOSIS — R55 Syncope and collapse: Secondary | ICD-10-CM

## 2012-09-07 DIAGNOSIS — I10 Essential (primary) hypertension: Secondary | ICD-10-CM

## 2012-09-07 NOTE — Assessment & Plan Note (Signed)
Symptoms most consistent with vasovagal near syncope.  EKG is reviewed and shows no acute changes.  EKG is compared to EKG 04/11/12 and appears unchanged. I do not think that her elevated blood pressure was cause for her episode. There was no bowel/bladder loss.  Case is reviewed with Dr. Abner Greenspan.  Plan to obtain echocardiogram.  Pt is agreeable. I have advised the pt to follow up with Dr. Abner Greenspan in 1 month and Go to ED if recurrent syncope.
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
## 2012-09-07 NOTE — Patient Instructions (Addendum)
You will be contacted about your echocardiogram.   Please continue bystolic 5mg , 1/2 tablet by mouth daily. Follow up in 1 month with Dr. Abner Greenspan.

## 2012-09-07 NOTE — Assessment & Plan Note (Signed)
BP looks ok today.  Advised pt to continue bystolic 2.5 mg once daily.

## 2012-09-22 ENCOUNTER — Ambulatory Visit (HOSPITAL_BASED_OUTPATIENT_CLINIC_OR_DEPARTMENT_OTHER)
Admission: RE | Admit: 2012-09-22 | Discharge: 2012-09-22 | Disposition: A | Discharge status: Home / Self Care | Payer: Managed Care, Other (non HMO) | Source: Ambulatory Visit | Attending: Family | Admitting: Family

## 2012-09-22 DIAGNOSIS — R55 Syncope and collapse: Secondary | ICD-10-CM

## 2012-09-22 NOTE — Progress Notes (Signed)
  Echocardiogram 2D Echocardiogram has been performed.  Kristen [name] 09/22/2012, 12:34 PM

## 2012-09-24 ENCOUNTER — Telehealth: Payer: Self-pay

## 2012-09-24 NOTE — Telephone Encounter (Signed)
Sorry but she will have to wait until her next rx is due. We cannot fill this med early since it is a controlled substance

## 2012-09-24 NOTE — Telephone Encounter (Signed)
Patient left a message stating that she had her Adderall refilled last week and had it yesterday but can't find it today? Pt stated on message that they are remodeling the kitchen and she doesn't know what happened to it. Pt stated she talked to the pharmacy and they stated she would have to call her MD to give them permission to go ahead and refill another RX she has.  Please advise?

## 2012-09-27 NOTE — Telephone Encounter (Signed)
Left a detailed message on patients voicemail.

## 2012-09-30 ENCOUNTER — Telehealth: Payer: Self-pay

## 2012-09-30 NOTE — Telephone Encounter (Signed)
FYI:  Patient left a message stating that she just wanted [place] to know that she found her adderall RX

## 2012-10-01 ENCOUNTER — Other Ambulatory Visit: Payer: Self-pay | Admitting: Family Medicine

## 2012-10-12 ENCOUNTER — Ambulatory Visit: Payer: Managed Care, Other (non HMO) | Admitting: Family Medicine

## 2012-10-12 DIAGNOSIS — Z0289 Encounter for other administrative examinations: Secondary | ICD-10-CM

## 2012-11-22 ENCOUNTER — Telehealth: Payer: Self-pay | Admitting: *Deleted

## 2012-11-22 DIAGNOSIS — D649 Anemia, unspecified: Secondary | ICD-10-CM

## 2012-11-22 DIAGNOSIS — Z79899 Other long term (current) drug therapy: Secondary | ICD-10-CM

## 2012-11-22 DIAGNOSIS — I10 Essential (primary) hypertension: Secondary | ICD-10-CM

## 2012-11-22 DIAGNOSIS — E785 Hyperlipidemia, unspecified: Secondary | ICD-10-CM

## 2012-11-22 DIAGNOSIS — Z0000 Encounter for general adult medical examination without abnormal findings: Secondary | ICD-10-CM

## 2012-11-22 LAB — CBC
Hemoglobin: 13.4 g/dL (ref 12.0–15.0)
MCV: 88.6 fL (ref 78.0–100.0)
RBC: 4.38 MIL/uL (ref 3.87–5.11)
RDW: 13.3 % (ref 11.5–15.5)

## 2012-11-22 NOTE — Telephone Encounter (Signed)
Lab orders placed, forwarded to [facility] 

## 2012-11-23 LAB — BASIC METABOLIC PANEL
BUN: 9 mg/dL (ref 6–23)
CO2: 26 mEq/L (ref 19–32)
Calcium: 9 mg/dL (ref 8.4–10.5)
Creat: 0.8 mg/dL (ref 0.50–1.10)
Glucose, Bld: 87 mg/dL (ref 70–99)

## 2012-11-23 LAB — URINALYSIS, ROUTINE W REFLEX MICROSCOPIC
Leukocytes, UA: NEGATIVE
Nitrite: NEGATIVE
Protein, ur: NEGATIVE mg/dL
Urobilinogen, UA: 1 mg/dL (ref 0.0–1.0)

## 2012-11-23 LAB — HEPATIC FUNCTION PANEL
Albumin: 3.8 g/dL (ref 3.5–5.2)
Indirect Bilirubin: 0.3 mg/dL (ref 0.0–0.9)
Total Protein: 6.2 g/dL (ref 6.0–8.3)

## 2012-11-23 LAB — LIPID PANEL
HDL: 38 mg/dL — ABNORMAL LOW (ref >39)
LDL Cholesterol: 136 mg/dL — ABNORMAL HIGH (ref 0–99)
Triglycerides: 217 mg/dL — ABNORMAL HIGH (ref <150)

## 2012-11-23 LAB — TSH: TSH: 0.029 u[IU]/mL — ABNORMAL LOW (ref 0.350–4.500)

## 2012-11-25 ENCOUNTER — Ambulatory Visit (INDEPENDENT_AMBULATORY_CARE_PROVIDER_SITE_OTHER): Payer: Managed Care, Other (non HMO) | Admitting: Family Medicine

## 2012-11-25 ENCOUNTER — Other Ambulatory Visit (HOSPITAL_COMMUNITY)
Admission: RE | Admit: 2012-11-25 | Discharge: 2012-11-25 | Disposition: A | Discharge status: Home / Self Care | Payer: Managed Care, Other (non HMO) | Source: Ambulatory Visit | Attending: Family Medicine | Admitting: Family Medicine

## 2012-11-25 ENCOUNTER — Encounter: Payer: Self-pay | Admitting: Family Medicine

## 2012-11-25 VITALS — BP 120/90 | HR 72 | Temp 98.4°F | Resp 14 | Ht 62.75 in | Wt 144.8 lb

## 2012-11-25 DIAGNOSIS — B379 Candidiasis, unspecified: Secondary | ICD-10-CM

## 2012-11-25 DIAGNOSIS — N760 Acute vaginitis: Secondary | ICD-10-CM | POA: Insufficient documentation

## 2012-11-25 DIAGNOSIS — T753XXD Motion sickness, subsequent encounter: Secondary | ICD-10-CM

## 2012-11-25 DIAGNOSIS — R87619 Unspecified abnormal cytological findings in specimens from cervix uteri: Secondary | ICD-10-CM

## 2012-11-25 DIAGNOSIS — Z0000 Encounter for general adult medical examination without abnormal findings: Secondary | ICD-10-CM

## 2012-11-25 DIAGNOSIS — F909 Attention-deficit hyperactivity disorder, unspecified type: Secondary | ICD-10-CM

## 2012-11-25 DIAGNOSIS — Z01419 Encounter for gynecological examination (general) (routine) without abnormal findings: Secondary | ICD-10-CM | POA: Insufficient documentation

## 2012-11-25 DIAGNOSIS — E039 Hypothyroidism, unspecified: Secondary | ICD-10-CM

## 2012-11-25 DIAGNOSIS — Z3040 Encounter for surveillance of contraceptives, unspecified: Secondary | ICD-10-CM

## 2012-11-25 DIAGNOSIS — Z124 Encounter for screening for malignant neoplasm of cervix: Secondary | ICD-10-CM

## 2012-11-25 DIAGNOSIS — I10 Essential (primary) hypertension: Secondary | ICD-10-CM

## 2012-11-25 DIAGNOSIS — N390 Urinary tract infection, site not specified: Secondary | ICD-10-CM

## 2012-11-25 DIAGNOSIS — D649 Anemia, unspecified: Secondary | ICD-10-CM

## 2012-11-25 DIAGNOSIS — E785 Hyperlipidemia, unspecified: Secondary | ICD-10-CM

## 2012-11-25 DIAGNOSIS — Z113 Encounter for screening for infections with a predominantly sexual mode of transmission: Secondary | ICD-10-CM | POA: Insufficient documentation

## 2012-11-25 DIAGNOSIS — Z5189 Encounter for other specified aftercare: Secondary | ICD-10-CM

## 2012-11-25 DIAGNOSIS — G43909 Migraine, unspecified, not intractable, without status migrainosus: Secondary | ICD-10-CM

## 2012-11-25 MED ORDER — HYDROCODONE-ACETAMINOPHEN 5-500 MG PO TABS: 1.0000 | ORAL_TABLET | Freq: Three times a day (TID) | ORAL | Status: DC | PRN

## 2012-11-25 MED ORDER — AMPHETAMINE-DEXTROAMPHETAMINE 20 MG PO TABS: 20.0000 mg | ORAL_TABLET | Freq: Two times a day (BID) | ORAL | Status: DC

## 2012-11-25 MED ORDER — AMPHETAMINE-DEXTROAMPHETAMINE 20 MG PO TABS: ORAL_TABLET | ORAL | Status: DC

## 2012-11-25 MED ORDER — SCOPOLAMINE 1 MG/3DAYS TD PT72: 1.0000 | MEDICATED_PATCH | TRANSDERMAL | Status: DC

## 2012-11-25 MED ORDER — NITROFURANTOIN MONOHYD MACRO 100 MG PO CAPS: ORAL_CAPSULE | ORAL | Status: DC

## 2012-11-25 MED ORDER — ONDANSETRON HCL 4 MG PO TABS: 4.0000 mg | ORAL_TABLET | Freq: Four times a day (QID) | ORAL | Status: DC

## 2012-11-25 MED ORDER — ACYCLOVIR 200 MG PO CAPS: 200.0000 mg | ORAL_CAPSULE | Freq: Two times a day (BID) | ORAL | Status: DC

## 2012-11-25 MED ORDER — FLUCONAZOLE 150 MG PO TABS: 150.0000 mg | ORAL_TABLET | Freq: Once | ORAL | Status: DC

## 2012-11-25 MED ORDER — NORETHIN ACE-ETH ESTRAD-FE 1-20 MG-MCG(24) PO TABS: 1.0000 | ORAL_TABLET | Freq: Every day | ORAL | Status: DC

## 2012-11-25 NOTE — Progress Notes (Signed)
Pre-visit discussion using our clinic review tool. No additional management support is needed unless otherwise documented below in the visit note.  

## 2012-11-28 ENCOUNTER — Encounter: Payer: Self-pay | Admitting: Family Medicine

## 2012-11-28 DIAGNOSIS — Z0000 Encounter for general adult medical examination without abnormal findings: Secondary | ICD-10-CM

## 2012-11-28 HISTORY — DX: Encounter for general adult medical examination without abnormal findings: Z00.00

## 2012-11-28 NOTE — Assessment & Plan Note (Signed)
Encouraged heart healthy diet, regular exercise, regular sleep, avoid trans fats.

## 2012-11-28 NOTE — Assessment & Plan Note (Signed)
Mildly suppressed TSH will continue Levothyroxine at same dose if patient becomes symptomatic should skip one dose weekly

## 2012-11-28 NOTE — Assessment & Plan Note (Signed)
Resolved

## 2012-11-28 NOTE — Assessment & Plan Note (Signed)
Pap smear today. 

## 2012-11-28 NOTE — Assessment & Plan Note (Signed)
Given refill on Macrobid

## 2012-11-28 NOTE — Assessment & Plan Note (Signed)
Encouraged good sleep hygiene and can continue current meds

## 2012-11-28 NOTE — Assessment & Plan Note (Signed)
Encouraged to avoid simple carbs, trans fats and saturated fats. Add krill oil caps

## 2012-11-28 NOTE — Progress Notes (Signed)
Patient ID: Kristen [name], female   DOB: Oct 06, 1975, 37 y.o.   MRN: [id number] Kristen [name] [id number] 08-02-1975 11/28/2012      Progress Note-Follow Up  Subjective  Chief Complaint  Chief Complaint  Patient presents with  . Annual Exam    w/PAP [Labs done prior]    HPI  Patient is a 37 year old caucasian female in today for annual exam. Doing well. Has a long history of cystitis and is stable on current meds. No recent illness, fevers, HA, cp, palp, sob or GI c/o. Taking meds as prescribed. No gyn concerns.  Past Medical History  Diagnosis Date  . Thyroid disease   . Hypertension   . ADD (attention deficit disorder)   . Fibromyalgia   . Chicken pox as a child  . Allergy     seasonal  . Asthma     mild, intermittent  . Recurrent UTI   . Anemia     blood loss  . Hyperlipidemia   . Depression 2002    post partum  . Renal lithiasis 09/24/2010  . Concussion 09/24/2010  . History of concussion 09/24/2010  . 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 Female bladder prolapse, acquired 09/24/2010  . HTN (hypertension) 09/24/2010  . Subacute and chronic vaginitis 09/24/2010  . Lymphadenopathy 09/24/2010  . Migraine aura, persistent, with cerebral infarct, status over 72 hours 09/24/2010  . History of migraines 09/24/2010  . Chest pain, atypical 10/08/2010  . Neck pain, musculoskeletal 12/16/2010  . Poor concentration 12/16/2010  . Bronchitis, acute 12/16/2010  . Contraceptive management 02/17/2011  . Interstitial cystitis 02/18/2011  . RUQ pain 05/27/2011  . Decreased libido 09/26/2011  . Migraine 09/24/2010  . Overweight(278.02) 12/26/2011  . Abnormal cervical cytology 09/26/2011    LGSIL in 2011, patient reports repeat pap was normal, has had intermittent abnormals with bx in past but always normalized, has had cryotherapy with good results Menarche at 17, irregular without birth control at times, very heavy LMP 08/26/2011 No gyn concerns, MGM last year normal   . Preventative health care 11/28/2012     Past Surgical History  Procedure Laterality Date  . Incontinence surgery  2003    Family History  Problem Relation Age of Onset  . Heart attack Father   . Hyperlipidemia Father   . Hypertension Father   . Alcohol abuse Father   . Heart disease Father   . Heart attack Brother 45  . Hypertension Brother   . Heart disease Brother 45    MI  . Dementia Maternal Grandfather 34    early stages  . Heart attack Paternal Grandfather   . Stroke Paternal Grandfather   . Heart disease Paternal Grandfather   . Alcohol abuse Paternal Grandfather   . Hyperlipidemia Paternal Grandfather   . Hypertension Paternal Grandfather     History   Social History  . Marital Status: Married    Spouse Name: N/A    Number of Children: N/A  . Years of Education: N/A   Occupational History  . Not on file.   Social History Main Topics  . Smoking status: Never Smoker   . Smokeless tobacco: Never Used  . Alcohol Use: Yes     Comment: 3 drinks weekly  . Drug Use: No  . Sexual Activity: Yes    Partners: Male   Other Topics Concern  . Not on file   Social History Narrative  . No narrative on file    Current Outpatient Prescriptions on File Prior  to Visit  Medication Sig Dispense Refill  . albuterol (PROVENTIL HFA;VENTOLIN HFA) 108 (90 BASE) MCG/ACT inhaler Inhale 1 puff into the lungs every 4 (four) hours as needed for wheezing. 1-2 puffs po bid prn asthma, as directed by pulmonolgy  3 Inhaler  2  . budesonide-formoterol (SYMBICORT) 160-4.5 MCG/ACT inhaler 1-2 puffs po bid prn asthma flare as directed by pulmonology  3 Inhaler  2  . EPINEPHrine (EPI-PEN) 0.3 mg/0.3 mL DEVI Inject 0.3 mLs (0.3 mg total) into the muscle as needed.  2 Device  1  . lidocaine (XYLOCAINE JELLY) 2 % jelly Apply to affected area daily prn  30 mL  1  . nebivolol (BYSTOLIC) 5 MG tablet Take 1 tablet (5 mg total) by mouth daily.  90 tablet  2  . niacin 500 MG CR capsule 1-2 tabs po at bedtime and take a lowfat snack  and 81 mg aspirin 1/2 before      . nystatin (MYCOSTATIN) 100000 UNIT/ML suspension Take 5 mLs (500,000 Units total) by mouth 4 (four) times daily as needed.  60 mL  0  . pentosan polysulfate (ELMIRON) 100 MG capsule Take 1 capsule (100 mg total) by mouth 3 (three) times daily before meals.  90 capsule  5   No current facility-administered medications on file prior to visit.    Allergies  Allergen Reactions  . Terazol [Terconazole]     Burning sensation    Review of Systems  Review of Systems  Constitutional: Negative for fever and malaise/fatigue.  HENT: Negative for congestion.   Eyes: Negative for discharge.  Respiratory: Negative for shortness of breath.   Cardiovascular: Negative for chest pain, palpitations and leg swelling.  Gastrointestinal: Negative for nausea, abdominal pain and diarrhea.  Genitourinary: Negative for dysuria.  Musculoskeletal: Negative for falls.  Skin: Negative for rash.  Neurological: Negative for loss of consciousness and headaches.  Endo/Heme/Allergies: Negative for polydipsia.  Psychiatric/Behavioral: Negative for depression and suicidal ideas. The patient is not nervous/anxious and does not have insomnia.     Objective  BP 120/90  Pulse 72  Temp(Src) 98.4 F (36.9 C) (Oral)  Resp 14  Ht 5' 2.75" (1.594 m)  Wt 144 lb 12 oz (65.658 kg)  BMI 25.84 kg/m2  SpO2 97%  LMP 11/13/2012  Physical Exam  Physical Exam  Constitutional: She is oriented to person, place, and time and well-developed, well-nourished, and in no distress. No distress.  HENT:  Head: Normocephalic and atraumatic.  Right Ear: External ear normal.  Left Ear: External ear normal.  Nose: Nose normal.  Mouth/Throat: Oropharynx is clear and moist. No oropharyngeal exudate.  Eyes: Conjunctivae are normal. Pupils are equal, round, and reactive to light. Right eye exhibits no discharge. Left eye exhibits no discharge. No scleral icterus.  Neck: Normal range of motion. Neck  supple. No thyromegaly present.  Cardiovascular: Normal rate, regular rhythm, normal heart sounds and intact distal pulses.   No murmur heard. Pulmonary/Chest: Effort normal and breath sounds normal. No respiratory distress. She has no wheezes. She has no rales.  Abdominal: Soft. Bowel sounds are normal. She exhibits no distension and no mass. There is no tenderness.  Musculoskeletal: Normal range of motion. She exhibits no edema and no tenderness.  Lymphadenopathy:    She has no cervical adenopathy.  Neurological: She is alert and oriented to person, place, and time. She has normal reflexes. No cranial nerve deficit. Coordination normal.  Skin: Skin is warm and dry. No rash noted. She is not diaphoretic.  Psychiatric: Mood, memory and affect normal.    Lab Results  Component Value Date   TSH 0.029* 11/22/2012   Lab Results  Component Value Date   WBC 7.0 11/22/2012   HGB 13.4 11/22/2012   HCT 38.8 11/22/2012   MCV 88.6 11/22/2012   PLT 207 11/22/2012   Lab Results  Component Value Date   CREATININE 0.80 11/22/2012   BUN 9 11/22/2012   NA 137 11/22/2012   K 4.2 11/22/2012   CL 105 11/22/2012   CO2 26 11/22/2012   Lab Results  Component Value Date   ALT 15 11/22/2012   AST 17 11/22/2012   ALKPHOS 40 11/22/2012   BILITOT 0.4 11/22/2012   Lab Results  Component Value Date   CHOL 217* 11/22/2012   Lab Results  Component Value Date   HDL 38* 11/22/2012   Lab Results  Component Value Date   LDLCALC 136* 11/22/2012   Lab Results  Component Value Date   TRIG 217* 11/22/2012   Lab Results  Component Value Date   CHOLHDL 5.7 11/22/2012     Assessment & Plan  HTN (hypertension) Adequate control, no changes.  Hypothyroid Mildly suppressed TSH will continue Levothyroxine at same dose if patient becomes symptomatic should skip one dose weekly  Migraine Encouraged good sleep hygiene and can continue current meds  Hyperlipidemia Encouraged to avoid simple  carbs, trans fats and saturated fats. Add krill oil caps  Anemia Resolved.   Abnormal cervical cytology Pap smear today  Recurrent UTI Given refill on Macrobid

## 2012-11-28 NOTE — Assessment & Plan Note (Signed)
Adequate control, no changes 

## 2012-12-01 ENCOUNTER — Encounter: Payer: Self-pay | Admitting: Family Medicine

## 2012-12-01 MED ORDER — METRONIDAZOLE 500 MG PO TABS: 500.0000 mg | ORAL_TABLET | Freq: Two times a day (BID) | ORAL | Status: DC

## 2012-12-01 NOTE — Addendum Note (Signed)
Addended by: Court Joy on: 12/01/2012 11:13 AM   Modules accepted: Orders

## 2012-12-02 ENCOUNTER — Other Ambulatory Visit: Payer: Self-pay | Admitting: Family Medicine

## 2012-12-02 ENCOUNTER — Telehealth: Payer: Self-pay

## 2012-12-02 DIAGNOSIS — J329 Chronic sinusitis, unspecified: Secondary | ICD-10-CM

## 2012-12-02 MED ORDER — AZITHROMYCIN 250 MG PO TABS: ORAL_TABLET | ORAL | Status: DC

## 2012-12-02 NOTE — Telephone Encounter (Signed)
Pt left a message stating she got my message that Flagyl was sent into the pharmacy but she is leaving for a cruise on Saturday and she has been sick for 1 week. Pt would like a Zpak sent to the pharmacy.  Please advise?

## 2012-12-02 NOTE — Telephone Encounter (Signed)
Please let patinet know I sent her antibiotic in to her pharmacy, thanks

## 2012-12-03 NOTE — Telephone Encounter (Signed)
LMOM with contact name and number RE: requested ABX sent to pharmacy per provider/SLS

## 2013-01-03 ENCOUNTER — Other Ambulatory Visit: Payer: Self-pay | Admitting: Family Medicine

## 2013-01-03 NOTE — Telephone Encounter (Signed)
Rx request to pharmacy/SLS  

## 2013-01-13 DIAGNOSIS — I639 Cerebral infarction, unspecified: Secondary | ICD-10-CM

## 2013-01-13 DIAGNOSIS — G459 Transient cerebral ischemic attack, unspecified: Secondary | ICD-10-CM

## 2013-01-13 HISTORY — DX: Transient cerebral ischemic attack, unspecified: G45.9

## 2013-01-13 HISTORY — DX: Cerebral infarction, unspecified: I63.9

## 2013-05-05 ENCOUNTER — Other Ambulatory Visit: Payer: Self-pay | Admitting: Family Medicine

## 2013-05-26 ENCOUNTER — Ambulatory Visit: Payer: Managed Care, Other (non HMO) | Admitting: Family Medicine

## 2013-06-02 ENCOUNTER — Ambulatory Visit: Payer: Managed Care, Other (non HMO) | Admitting: Family Medicine

## 2013-06-02 DIAGNOSIS — Z0289 Encounter for other administrative examinations: Secondary | ICD-10-CM

## 2013-07-08 ENCOUNTER — Emergency Department (HOSPITAL_BASED_OUTPATIENT_CLINIC_OR_DEPARTMENT_OTHER)
Admission: EM | Admit: 2013-07-08 | Discharge: 2013-07-08 | Disposition: A | Discharge status: Home / Self Care | Payer: Managed Care, Other (non HMO) | Attending: Emergency Medicine | Admitting: Emergency Medicine

## 2013-07-08 ENCOUNTER — Encounter (HOSPITAL_BASED_OUTPATIENT_CLINIC_OR_DEPARTMENT_OTHER): Payer: Self-pay | Admitting: Emergency Medicine

## 2013-07-08 DIAGNOSIS — Z87820 Personal history of traumatic brain injury: Secondary | ICD-10-CM | POA: Insufficient documentation

## 2013-07-08 DIAGNOSIS — Z8619 Personal history of other infectious and parasitic diseases: Secondary | ICD-10-CM | POA: Insufficient documentation

## 2013-07-08 DIAGNOSIS — Z87442 Personal history of urinary calculi: Secondary | ICD-10-CM | POA: Insufficient documentation

## 2013-07-08 DIAGNOSIS — IMO0002 Reserved for concepts with insufficient information to code with codable children: Secondary | ICD-10-CM | POA: Insufficient documentation

## 2013-07-08 DIAGNOSIS — J40 Bronchitis, not specified as acute or chronic: Secondary | ICD-10-CM

## 2013-07-08 DIAGNOSIS — Z862 Personal history of diseases of the blood and blood-forming organs and certain disorders involving the immune mechanism: Secondary | ICD-10-CM | POA: Insufficient documentation

## 2013-07-08 DIAGNOSIS — F988 Other specified behavioral and emotional disorders with onset usually occurring in childhood and adolescence: Secondary | ICD-10-CM | POA: Insufficient documentation

## 2013-07-08 DIAGNOSIS — Z79899 Other long term (current) drug therapy: Secondary | ICD-10-CM | POA: Insufficient documentation

## 2013-07-08 DIAGNOSIS — E079 Disorder of thyroid, unspecified: Secondary | ICD-10-CM | POA: Insufficient documentation

## 2013-07-08 DIAGNOSIS — H9209 Otalgia, unspecified ear: Secondary | ICD-10-CM | POA: Insufficient documentation

## 2013-07-08 DIAGNOSIS — Z87448 Personal history of other diseases of urinary system: Secondary | ICD-10-CM | POA: Insufficient documentation

## 2013-07-08 DIAGNOSIS — J029 Acute pharyngitis, unspecified: Secondary | ICD-10-CM | POA: Insufficient documentation

## 2013-07-08 DIAGNOSIS — H9201 Otalgia, right ear: Secondary | ICD-10-CM

## 2013-07-08 DIAGNOSIS — Z792 Long term (current) use of antibiotics: Secondary | ICD-10-CM | POA: Insufficient documentation

## 2013-07-08 DIAGNOSIS — J45901 Unspecified asthma with (acute) exacerbation: Secondary | ICD-10-CM | POA: Insufficient documentation

## 2013-07-08 DIAGNOSIS — Z8742 Personal history of other diseases of the female genital tract: Secondary | ICD-10-CM | POA: Insufficient documentation

## 2013-07-08 DIAGNOSIS — Z87440 Personal history of urinary (tract) infections: Secondary | ICD-10-CM | POA: Insufficient documentation

## 2013-07-08 DIAGNOSIS — I10 Essential (primary) hypertension: Secondary | ICD-10-CM | POA: Insufficient documentation

## 2013-07-08 MED ORDER — DEXAMETHASONE SODIUM PHOSPHATE 10 MG/ML IJ SOLN
10.0000 mg | Freq: Once | INTRAMUSCULAR | Status: AC
  Administered 2013-07-08: 10 mg via INTRAMUSCULAR
  Filled 2013-07-08: qty 1

## 2013-07-08 MED ORDER — ALBUTEROL SULFATE HFA 108 (90 BASE) MCG/ACT IN AERS: 2.0000 | INHALATION_SPRAY | RESPIRATORY_TRACT | Status: DC | PRN

## 2013-07-08 MED ORDER — AMOXICILLIN 500 MG PO CAPS: 1000.0000 mg | ORAL_CAPSULE | Freq: Two times a day (BID) | ORAL | Status: DC

## 2013-07-08 MED ORDER — FLUCONAZOLE 150 MG PO TABS: 150.0000 mg | ORAL_TABLET | Freq: Once | ORAL | Status: DC

## 2013-07-08 MED ORDER — BUDESONIDE 180 MCG/ACT IN AEPB: 2.0000 | INHALATION_SPRAY | Freq: Two times a day (BID) | RESPIRATORY_TRACT | Status: DC

## 2013-07-08 NOTE — Discharge Instructions (Signed)
Bronchitis  Bronchitis is swelling (inflammation) of the air tubes leading to your lungs (bronchi). This causes mucus and a cough. If the swelling gets bad, you may have trouble breathing.  HOME CARE   · Rest.  · Drink enough fluids to keep your pee (urine) clear or pale yellow (unless you have a condition where you have to watch how much you drink).  · Only take medicine as told by your doctor. If you were given antibiotic medicines, finish them even if you start to feel better.  · Avoid smoke, irritating chemicals, and strong smells. These make the problem worse. Quit smoking if you smoke. This helps your lungs heal faster.  · Use a cool mist humidifier. Change the water in the humidifier every day. You can also sit in the bathroom with hot shower running for 5-10 minutes. Keep the door closed.  · See your health care provider as told.  · Wash your hands often.  GET HELP IF:  Your problems do not get better after 1 week.  GET HELP RIGHT AWAY IF:   · Your fever gets worse.  · You have chills.  · Your chest hurts.  · Your problems breathing get worse.  · You have blood in your mucus.  · You pass out (faint).  · You feel light-headed.  · You have a bad headache.  · You throw up (vomit) again and again.  MAKE SURE YOU:  · Understand these instructions.  · Will watch your condition.  · Will get help right away if you are not doing well or get worse.  Document Released: 06/18/2007 Document Revised: 01/04/2013 Document Reviewed: 08/24/2012  Exit[employer]® Patient Information ©2015 [employer]. This information is not intended to replace advice given to you by your health care provider. Make sure you discuss any questions you have with your health care provider.

## 2013-07-08 NOTE — ED Notes (Signed)
Bil ear pain, worse on right. Sinus congestion, sore throat and cough x5 days.

## 2013-07-08 NOTE — ED Provider Notes (Signed)
Medical screening examination/treatment/procedure(s) were performed by non-physician practitioner and as supervising physician I was immediately available for consultation/collaboration.   EKG Interpretation None        Melanie Belfi, MD 07/08/13 2256 

## 2013-07-08 NOTE — ED Provider Notes (Signed)
CSN: [id number]     Arrival date & time 07/08/13  2029 History   First MD Initiated Contact with Patient 07/08/13 2048     Chief Complaint  Patient presents with  . Otalgia     (Consider location/radiation/quality/duration/timing/severity/associated sxs/prior Treatment) Patient is a 38 y.o. female presenting with ear pain. The history is provided by the patient. No language interpreter was used.  Otalgia Location:  Right Associated symptoms: congestion, cough and sore throat   Associated symptoms: no abdominal pain, no fever, no rash and no vomiting   Associated symptoms comment:  She complains of cough, wheezing, laryngitis with sore throat, sinus pressure for the past 5 days. She was seen at Urgent Care 2 days ago and reports a negative strep test. Since then, her symptoms have worsened with onset of significant right ear pain with history of spontaneous rupture in the past.    Past Medical History  Diagnosis Date  . Thyroid disease   . Hypertension   . ADD (attention deficit disorder)   . Fibromyalgia   . Chicken pox as a child  . Allergy     seasonal  . Asthma     mild, intermittent  . Recurrent UTI   . Anemia     blood loss  . Hyperlipidemia   . Depression 2002    post partum  . Renal lithiasis 09/24/2010  . Concussion 09/24/2010  . History of concussion 09/24/2010  . 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 Female bladder prolapse, acquired 09/24/2010  . HTN (hypertension) 09/24/2010  . Subacute and chronic vaginitis 09/24/2010  . Lymphadenopathy 09/24/2010  . Migraine aura, persistent, with cerebral infarct, status over 72 hours 09/24/2010  . History of migraines 09/24/2010  . Chest pain, atypical 10/08/2010  . Neck pain, musculoskeletal 12/16/2010  . Poor concentration 12/16/2010  . Bronchitis, acute 12/16/2010  . Contraceptive management 02/17/2011  . Interstitial cystitis 02/18/2011  . RUQ pain 05/27/2011  . Decreased libido 09/26/2011  . Migraine 09/24/2010  . Overweight(278.02)  12/26/2011  . Abnormal cervical cytology 09/26/2011    LGSIL in 2011, patient reports repeat pap was normal, has had intermittent abnormals with bx in past but always normalized, has had cryotherapy with good results Menarche at 17, irregular without birth control at times, very heavy LMP 08/26/2011 No gyn concerns, MGM last year normal   . Preventative health care 11/28/2012   Past Surgical History  Procedure Laterality Date  . Incontinence surgery  2003   Family History  Problem Relation Age of Onset  . Heart attack Father   . Hyperlipidemia Father   . Hypertension Father   . Alcohol abuse Father   . Heart disease Father   . Heart attack Brother 45  . Hypertension Brother   . Heart disease Brother 45    MI  . Dementia Maternal Grandfather 17    early stages  . Heart attack Paternal Grandfather   . Stroke Paternal Grandfather   . Heart disease Paternal Grandfather   . Alcohol abuse Paternal Grandfather   . Hyperlipidemia Paternal Grandfather   . Hypertension Paternal Grandfather    History  Substance Use Topics  . Smoking status: Never Smoker   . Smokeless tobacco: Never Used  . Alcohol Use: Yes     Comment: rarely   OB History   Grav Para Term Preterm Abortions TAB SAB Ect Mult Living                 Review of Systems  Constitutional: Positive for  chills. Negative for fever.  HENT: Positive for congestion, ear pain and sore throat. Negative for trouble swallowing.   Respiratory: Positive for cough, chest tightness and wheezing.   Cardiovascular: Negative for chest pain.  Gastrointestinal: Negative for vomiting and abdominal pain.  Musculoskeletal: Negative for myalgias.  Skin: Negative for rash.      Allergies  Terazol  Home Medications   Prior to Admission medications   Medication Sig Start Date End Date Taking? Authorizing Provider  acyclovir (ZOVIRAX) 200 MG capsule Take 1 capsule (200 mg total) by mouth 2 (two) times daily. 11/25/12   Bradd CanaryStacey A Blyth, MD   albuterol (PROVENTIL HFA;VENTOLIN HFA) 108 (90 BASE) MCG/ACT inhaler Inhale 1 puff into the lungs every 4 (four) hours as needed for wheezing. 1-2 puffs po bid prn asthma, as directed by pulmonolgy 09/26/11   Bradd CanaryStacey A Blyth, MD  amphetamine-dextroamphetamine (ADDERALL) 20 MG tablet One tablet by mouth twice daily 11/25/12   Bradd CanaryStacey A Blyth, MD  amphetamine-dextroamphetamine (ADDERALL) 20 MG tablet Take 1 tablet (20 mg total) by mouth 2 (two) times daily. Fill on or After December 25, 2012 11/25/12   Bradd CanaryStacey A Blyth, MD  amphetamine-dextroamphetamine (ADDERALL) 20 MG tablet Take 1 tablet (20 mg total) by mouth 2 (two) times daily. Fill on or After January 24, 2013 11/25/12   Bradd CanaryStacey A Blyth, MD  azithromycin [facility](ZITHROMAX) 250 MG tablet 2 tabs po once and then 1 tab po daily x 4 days 12/02/12   Bradd CanaryStacey A Blyth, MD  budesonide-formoterol [facility](SYMBICORT) 160-4.5 MCG/ACT inhaler 1-2 puffs po bid prn asthma flare as directed by pulmonology 09/26/11   Bradd CanaryStacey A Blyth, MD  EPINEPHrine (EPI-PEN) 0.3 mg/0.3 mL DEVI Inject 0.3 mLs (0.3 mg total) into the muscle as needed. 09/26/11   Bradd CanaryStacey A Blyth, MD  fluconazole (DIFLUCAN) 150 MG tablet Take 1 tablet (150 mg total) by mouth once. 11/25/12   Bradd CanaryStacey A Blyth, MD  HYDROcodone-acetaminophen (VICODIN) 5-500 MG per tablet Take 1 tablet by mouth every 8 (eight) hours as needed for pain. 11/25/12   Bradd CanaryStacey A Blyth, MD  lidocaine (XYLOCAINE JELLY) 2 % jelly Apply to affected area daily prn 09/26/11   Bradd CanaryStacey A Blyth, MD  metroNIDAZOLE (FLAGYL) 500 MG tablet Take 1 tablet (500 mg total) by mouth 2 (two) times daily. X 7 days 12/01/12   Bradd CanaryStacey A Blyth, MD  nebivolol (BYSTOLIC) 5 MG tablet Take 1 tablet (5 mg total) by mouth daily. 09/26/11   Bradd CanaryStacey A Blyth, MD  niacin 500 MG CR capsule 1-2 tabs po at bedtime and take a lowfat snack and 81 mg aspirin 1/2 before 09/26/11   Bradd CanaryStacey A Blyth, MD  nitrofurantoin, macrocrystal-monohydrate, (MACROBID) 100 MG capsule 1 tab po qd x 1 dose prn for  prevention of UTI (after self cath, after swimming or hot tub use, after intercourse) 11/25/12   Bradd CanaryStacey A Blyth, MD  Norethindrone Acetate-Ethinyl Estrad-FE (LOESTRIN 24 FE) 1-20 MG-MCG(24) tablet Take 1 tablet by mouth daily. 11/25/12   Bradd CanaryStacey A Blyth, MD  nystatin (MYCOSTATIN) 100000 UNIT/ML suspension Take 5 mLs (500,000 Units total) by mouth 4 (four) times daily as needed. 07/07/12   Bradd CanaryStacey A Blyth, MD  ondansetron (ZOFRAN) 4 MG tablet Take 1 tablet (4 mg total) by mouth every 6 (six) hours. 11/25/12   Bradd CanaryStacey A Blyth, MD  pentosan polysulfate (ELMIRON) 100 MG capsule Take 1 capsule (100 mg total) by mouth 3 (three) times daily before meals. 05/05/12   Sandford CrazeMelissa O'Sullivan, NP  scopolamine (TRANSDERM-SCOP) 1.5 MG Place  1 patch (1.5 mg total) onto the skin every 3 (three) days. 11/25/12   Bradd CanaryStacey A Blyth, MD  SYNTHROID 137 MCG tablet TAKE 1 TABLET BY MOUTH ONCE DAILY 05/05/13   Bradd CanaryStacey A Blyth, MD   BP 155/98  Pulse 91  Temp(Src) 98.9 F (37.2 C) (Oral)  Resp 16  Ht 5\' 2"  (1.575 m)  Wt 150 lb (68.04 kg)  BMI 27.43 kg/m2  SpO2 99%  LMP 06/30/2013 Physical Exam  Constitutional: She is oriented to person, place, and time. She appears well-developed and well-nourished. No distress.  HENT:  Head: Normocephalic.  Right Ear: External ear normal.  Left Ear: External ear normal.  Neck: Normal range of motion. Neck supple.  Cardiovascular: Normal rate and regular rhythm.   No murmur heard. Pulmonary/Chest: Effort normal and breath sounds normal. She has no wheezes. She has no rales. She exhibits no tenderness.  Abdominal: Soft. Bowel sounds are normal. There is no tenderness. There is no rebound and no guarding.  Musculoskeletal: Normal range of motion.  Lymphadenopathy:    She has cervical adenopathy.  Neurological: She is alert and oriented to person, place, and time.  Skin: Skin is warm and dry. No rash noted.  Psychiatric: She has a normal mood and affect.    ED Course  Procedures  (including critical care time) Labs Review Labs Reviewed - No data to display  Imaging Review No results found.   EKG Interpretation None      MDM   Final diagnoses:  None    1. Bronchitis 2. Otalgia  She has required multiple breathing treatments to clear wheezing. Will cover with antibiotics, supportive care. Encourage PCP follow up.    Arnoldo HookerShari A Vinette Crites, PA-C 07/08/13 2145

## 2013-07-11 ENCOUNTER — Telehealth: Payer: Self-pay | Admitting: Family Medicine

## 2013-07-11 MED ORDER — ALBUTEROL SULFATE HFA 108 (90 BASE) MCG/ACT IN AERS: 1.0000 | INHALATION_SPRAY | RESPIRATORY_TRACT | Status: DC | PRN

## 2013-07-11 MED ORDER — BUDESONIDE-FORMOTEROL FUMARATE 160-4.5 MCG/ACT IN AERO: INHALATION_SPRAY | RESPIRATORY_TRACT | Status: DC

## 2013-07-11 NOTE — Telephone Encounter (Signed)
Left detailed message informing patient of medication refill and to call our office to schedule an appointment °

## 2013-07-11 NOTE — Telephone Encounter (Signed)
Please call and inform pt that rx's were sent but appt needs to be made for addt refills

## 2013-07-11 NOTE — Telephone Encounter (Signed)
Refill- symbicort  Refill- ventolin  [facility] [id number]

## 2013-07-12 ENCOUNTER — Telehealth: Payer: Self-pay | Admitting: Family Medicine

## 2013-07-12 NOTE — Telephone Encounter (Signed)
Received PA for symbicort, forward paperwork to nurse

## 2013-07-13 NOTE — Telephone Encounter (Signed)
Left detailed message on pt's voicemail requesting return call to let us know if she has tried any other inhalers in the past. If so, which ones, how long did she take them and reason for stopping them.

## 2013-07-19 ENCOUNTER — Telehealth: Payer: Self-pay | Admitting: *Deleted

## 2013-07-19 MED ORDER — AMPHETAMINE-DEXTROAMPHETAMINE 20 MG PO TABS: ORAL_TABLET | ORAL | Status: DC

## 2013-07-19 NOTE — Telephone Encounter (Signed)
Pt left message requesting refill of Adderall. She has f/u on 09/13/13. Looks like she is due for refills but I did not print them as I am not sure if Dr Abner GreenspanBlyth has been giving pt more than 1 month supply at a time.

## 2013-07-19 NOTE — Telephone Encounter (Signed)
Pt left message that she has tried Advair and Pulmicort in the past and neither controlled her asthma symptoms well. Pt states Symbicort is the only thing that has helped her asthma and she will pay for it out of pocket if insurance doesn't approve it.

## 2013-07-19 NOTE — Telephone Encounter (Signed)
Pa sent to be faxed

## 2013-07-19 NOTE — Telephone Encounter (Signed)
OK to refill 30 day supply

## 2013-07-19 NOTE — Telephone Encounter (Signed)
It looks like the last RX was done on 11-25-12 for jan 12, 15.  Please advise?

## 2013-07-19 NOTE — Telephone Encounter (Signed)
Please inform this patient that this RX is ready to be picked up

## 2013-07-20 NOTE — Telephone Encounter (Signed)
Left message at [phone] informing pt ready for pick up

## 2013-07-21 NOTE — Telephone Encounter (Signed)
The PA paperwork says she had to fail pulmicort which she did, they also mention foradil and I would argue that that med is not strong enough. So see if that will get it approved

## 2013-07-21 NOTE — Telephone Encounter (Signed)
FYI: Pa for symbicort Was denied

## 2013-07-22 NOTE — Telephone Encounter (Signed)
Sent a letter for appeal

## 2013-08-04 NOTE — Telephone Encounter (Signed)
Insurance denied. Can try to get out of pocket cost, [facility] or [facility]. Or Dr Abner GreenspanBlyth will call in another drug that insurance will cover. Lm on Vm awaiting return call

## 2013-08-23 ENCOUNTER — Other Ambulatory Visit: Payer: Self-pay

## 2013-08-23 MED ORDER — AMPHETAMINE-DEXTROAMPHETAMINE 20 MG PO TABS: ORAL_TABLET | ORAL | Status: DC

## 2013-08-23 NOTE — Telephone Encounter (Signed)
Patient left a message stating she needs one more refill of her adderall to get her through until her appt on 09-23-13?  Please advise?  Last RX done on 07-19-13 quantity 60 with 0 refills

## 2013-08-24 NOTE — Telephone Encounter (Signed)
RX is ready and put at front desk.   I tried to call and inform patient but vm is full

## 2013-08-26 ENCOUNTER — Ambulatory Visit (INDEPENDENT_AMBULATORY_CARE_PROVIDER_SITE_OTHER): Payer: Managed Care, Other (non HMO) | Admitting: Internal Medicine

## 2013-08-26 VITALS — BP 120/74 | HR 72 | Temp 99.3°F | Resp 18 | Ht 63.0 in | Wt 152.0 lb

## 2013-08-26 DIAGNOSIS — R509 Fever, unspecified: Secondary | ICD-10-CM

## 2013-08-26 DIAGNOSIS — R51 Headache: Secondary | ICD-10-CM

## 2013-08-26 DIAGNOSIS — N3010 Interstitial cystitis (chronic) without hematuria: Secondary | ICD-10-CM

## 2013-08-26 DIAGNOSIS — M542 Cervicalgia: Secondary | ICD-10-CM

## 2013-08-26 DIAGNOSIS — D72820 Lymphocytosis (symptomatic): Secondary | ICD-10-CM

## 2013-08-26 LAB — POCT URINALYSIS DIPSTICK
BILIRUBIN UA: NEGATIVE
GLUCOSE UA: NEGATIVE
LEUKOCYTES UA: NEGATIVE
Nitrite, UA: NEGATIVE
Protein, UA: 30
Spec Grav, UA: >=1.03
Urobilinogen, UA: 0.2
pH, UA: 6

## 2013-08-26 LAB — POCT CBC
Granulocyte percent: 49.8 %G (ref 37–80)
HEMATOCRIT: 47.3 % (ref 37.7–47.9)
Hemoglobin: 15.3 g/dL (ref 12.2–16.2)
Lymph, poc: 4.4 — AB (ref 0.6–3.4)
MCH: 30 pg (ref 27–31.2)
MCHC: 32.2 g/dL (ref 31.8–35.4)
MCV: 92.9 fL (ref 80–97)
MID (cbc): 0.4 (ref 0–0.9)
MPV: 8.8 fL (ref 0–99.8)
POC GRANULOCYTE: 4.8 (ref 2–6.9)
POC LYMPH %: 45.8 % (ref 10–50)
POC MID %: 4.4 %M (ref 0–12)
Platelet Count, POC: 241 10*3/uL (ref 142–424)
RBC: 5.09 M/uL (ref 4.04–5.48)
RDW, POC: 13.7 %
WBC: 9.7 10*3/uL (ref 4.6–10.2)

## 2013-08-26 LAB — POCT UA - MICROSCOPIC ONLY
Bacteria, U Microscopic: NEGATIVE
Casts, Ur, LPF, POC: NEGATIVE
Crystals, Ur, HPF, POC: NEGATIVE
Mucus, UA: NEGATIVE
YEAST UA: NEGATIVE

## 2013-08-26 MED ORDER — TRAMADOL HCL 50 MG PO TABS: 50.0000 mg | ORAL_TABLET | Freq: Four times a day (QID) | ORAL | Status: DC | PRN

## 2013-08-26 MED ORDER — MAGIC MOUTHWASH W/LIDOCAINE: 5.0000 mL | Freq: Three times a day (TID) | ORAL | Status: DC | PRN

## 2013-08-26 MED ORDER — DOXYCYCLINE HYCLATE 100 MG PO TABS: 100.0000 mg | ORAL_TABLET | Freq: Two times a day (BID) | ORAL | Status: DC

## 2013-08-26 MED ORDER — MELOXICAM 15 MG PO TABS: 15.0000 mg | ORAL_TABLET | Freq: Every day | ORAL | Status: DC

## 2013-08-26 MED ORDER — CYCLOBENZAPRINE HCL 10 MG PO TABS: 10.0000 mg | ORAL_TABLET | Freq: Every day | ORAL | Status: DC
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
## 2013-08-27 LAB — COMPREHENSIVE METABOLIC PANEL
ALBUMIN: 4.6 g/dL (ref 3.5–5.2)
ALT: 16 U/L (ref 0–35)
AST: 20 U/L (ref 0–37)
Alkaline Phosphatase: 56 U/L (ref 39–117)
BUN: 13 mg/dL (ref 6–23)
CO2: 26 meq/L (ref 19–32)
Calcium: 9.6 mg/dL (ref 8.4–10.5)
Chloride: 100 mEq/L (ref 96–112)
Creat: 0.85 mg/dL (ref 0.50–1.10)
Glucose, Bld: 93 mg/dL (ref 70–99)
POTASSIUM: 4.2 meq/L (ref 3.5–5.3)
SODIUM: 139 meq/L (ref 135–145)
TOTAL PROTEIN: 7.5 g/dL (ref 6.0–8.3)
Total Bilirubin: 0.3 mg/dL (ref 0.2–1.2)

## 2013-08-27 LAB — EPSTEIN-BARR VIRUS VCA ANTIBODY PANEL
EBV EA IgG: 17.3 U/mL — ABNORMAL HIGH (ref <9.0)
EBV NA IgG: 162 U/mL — ABNORMAL HIGH (ref <18.0)
EBV VCA IgG: 618 U/mL — ABNORMAL HIGH (ref <18.0)
EBV VCA IgM: <10 U/mL (ref <36.0)

## 2013-09-13 ENCOUNTER — Ambulatory Visit: Payer: Managed Care, Other (non HMO) | Admitting: Family Medicine

## 2013-09-23 ENCOUNTER — Ambulatory Visit (INDEPENDENT_AMBULATORY_CARE_PROVIDER_SITE_OTHER): Payer: Managed Care, Other (non HMO) | Admitting: Family Medicine

## 2013-09-23 ENCOUNTER — Encounter: Payer: Self-pay | Admitting: Family Medicine

## 2013-09-23 VITALS — BP 145/103 | HR 80 | Temp 98.7°F | Ht 63.0 in | Wt 153.6 lb

## 2013-09-23 DIAGNOSIS — R519 Headache, unspecified: Secondary | ICD-10-CM

## 2013-09-23 DIAGNOSIS — T148 Other injury of unspecified body region: Secondary | ICD-10-CM

## 2013-09-23 DIAGNOSIS — I10 Essential (primary) hypertension: Secondary | ICD-10-CM

## 2013-09-23 DIAGNOSIS — R0789 Other chest pain: Secondary | ICD-10-CM

## 2013-09-23 DIAGNOSIS — E785 Hyperlipidemia, unspecified: Secondary | ICD-10-CM

## 2013-09-23 DIAGNOSIS — IMO0001 Reserved for inherently not codable concepts without codable children: Secondary | ICD-10-CM

## 2013-09-23 DIAGNOSIS — W57XXXA Bitten or stung by nonvenomous insect and other nonvenomous arthropods, initial encounter: Secondary | ICD-10-CM

## 2013-09-23 DIAGNOSIS — E039 Hypothyroidism, unspecified: Secondary | ICD-10-CM

## 2013-09-23 DIAGNOSIS — R51 Headache: Secondary | ICD-10-CM

## 2013-09-23 DIAGNOSIS — R030 Elevated blood-pressure reading, without diagnosis of hypertension: Secondary | ICD-10-CM

## 2013-09-23 LAB — CBC
HCT: 42 % (ref 36.0–46.0)
HEMOGLOBIN: 14.3 g/dL (ref 12.0–15.0)
MCHC: 33.9 g/dL (ref 30.0–36.0)
MCV: 90.5 fl (ref 78.0–100.0)
Platelets: 200 10*3/uL (ref 150.0–400.0)
RBC: 4.65 Mil/uL (ref 3.87–5.11)
RDW: 13 % (ref 11.5–15.5)
WBC: 6.8 10*3/uL (ref 4.0–10.5)

## 2013-09-23 LAB — TSH: TSH: 1.65 u[IU]/mL (ref 0.35–4.50)

## 2013-09-23 LAB — SEDIMENTATION RATE: SED RATE: 9 mm/h (ref 0–22)

## 2013-09-23 MED ORDER — DOXYCYCLINE HYCLATE 100 MG PO TABS: 100.0000 mg | ORAL_TABLET | Freq: Two times a day (BID) | ORAL | Status: DC

## 2013-09-23 MED ORDER — DOXYCYCLINE HYCLATE 100 MG PO TABS
100.0000 mg | ORAL_TABLET | Freq: Two times a day (BID) | ORAL | Status: DC
Start: 2013-09-23 — End: 2014-05-11

## 2013-09-23 MED ORDER — AMPHETAMINE-DEXTROAMPHETAMINE 20 MG PO TABS: ORAL_TABLET | ORAL | Status: DC

## 2013-09-23 NOTE — Progress Notes (Signed)
Pre visit review using our clinic review tool, if applicable. No additional management support is needed unless otherwise documented below in the visit note. 

## 2013-09-23 NOTE — Patient Instructions (Addendum)
Tick Bite Information Ticks are insects that attach themselves to the skin and draw blood for food. There are various types of ticks. Common types include wood ticks and deer ticks. Most ticks live in shrubs and grassy areas. Ticks can climb onto your body when you make contact with leaves or grass where the tick is waiting. The most common places on the body for ticks to attach themselves are the scalp, neck, armpits, waist, and groin. Most tick bites are harmless, but sometimes ticks carry germs that cause diseases. These germs can be spread to a person during the tick's feeding process. The chance of a disease spreading through a tick bite depends on:   The type of tick.  Time of year.   How long the tick is attached.   Geographic location.  HOW CAN YOU PREVENT TICK BITES? Take these steps to help prevent tick bites when you are outdoors:  Wear protective clothing. Long sleeves and long pants are best.   Wear white clothes so you can see ticks more easily.  Tuck your pant legs into your socks.   If walking on a trail, stay in the middle of the trail to avoid brushing against bushes.  Avoid walking through areas with long grass.  Put insect repellent on all exposed skin and along boot tops, pant legs, and sleeve cuffs.   Check clothing, hair, and skin repeatedly and before going inside.   Brush off any ticks that are not attached.  Take a shower or bath as soon as possible after being outdoors.  WHAT IS THE PROPER WAY TO REMOVE A TICK? Ticks should be removed as soon as possible to help prevent diseases caused by tick bites. 1. If latex gloves are available, put them on before trying to remove a tick.  2. Using fine-point tweezers, grasp the tick as close to the skin as possible. You may also use curved forceps or a tick removal tool. Grasp the tick as close to its head as possible. Avoid grasping the tick on its body. 3. Pull gently with steady upward pressure until  the tick lets go. Do not twist the tick or jerk it suddenly. This may break off the tick's head or mouth parts. 4. Do not squeeze or crush the tick's body. This could force disease-carrying fluids from the tick into your body.  5. After the tick is removed, wash the bite area and your hands with soap and water or other disinfectant such as alcohol. 6. Apply a small amount of antiseptic cream or ointment to the bite site.  7. Wash and disinfect any instruments that were used.  Do not try to remove a tick by applying a hot match, petroleum jelly, or fingernail polish to the tick. These methods do not work and may increase the chances of disease being spread from the tick bite.  WHEN SHOULD YOU SEEK MEDICAL CARE? Contact your health care provider if you are unable to remove a tick from your skin or if a part of the tick breaks off and is stuck in the skin.  After a tick bite, you need to be aware of signs and symptoms that could be related to diseases spread by ticks. Contact your health care provider if you develop any of the following in the days or weeks after the tick bite:  Unexplained fever.  Rash. A circular rash that appears days or weeks after the tick bite may indicate the possibility of Lyme disease. The rash may resemble   a target with a bull's-eye and may occur at a different part of your body than the tick bite.  Redness and swelling in the area of the tick bite.   Tender, swollen lymph glands.   Diarrhea.   Weight loss.   Cough.   Fatigue.   Muscle, joint, or bone pain.   Abdominal pain.   Headache.   Lethargy or a change in your level of consciousness.  Difficulty walking or moving your legs.   Numbness in the legs.   Paralysis.  Shortness of breath.   Confusion.   Repeated vomiting.  Document Released: 12/28/1999 Document Revised: 10/20/2012 Document Reviewed: 06/09/2012 [employer] Patient Information 2015 [employer]. This information is  not intended to replace advice given to you by your health care provider. Make sure you discuss any questions you have with your health care provider.  

## 2013-09-26 ENCOUNTER — Encounter: Payer: Self-pay | Admitting: Family Medicine

## 2013-09-26 DIAGNOSIS — R519 Headache, unspecified: Secondary | ICD-10-CM

## 2013-09-26 DIAGNOSIS — R51 Headache: Secondary | ICD-10-CM

## 2013-09-26 HISTORY — DX: Headache, unspecified: R51.9

## 2013-09-26 LAB — LYME AB/WESTERN BLOT REFLEX: B BURGDORFERI AB IGG+ IGM: 0.5 {ISR}

## 2013-09-26 NOTE — Progress Notes (Signed)
Patient ID: Kristen [name], female   DOB: 03/10/1975, 38 y.o.   MRN: [id number] Kristen [name] [id number] 12-15-1975 09/26/2013      Progress Note-Follow Up  Subjective  Chief Complaint  Chief Complaint  Patient presents with  . Follow-up  . Hypertension    HPI  Patient is a 38 year old female in today for routine medical care. She is c/o hi blood pressure for roughly 3 weeks. She thinks it correlates with a sore neck and possible tick bite. Has also been noting some left sided neck pain, left facial numbness some episodes of blurry vision but no other visual changes. Does note some malaise. No cp/palp/sob. No GI or GU c/o  Past Medical History  Diagnosis Date  . Thyroid disease   . Hypertension   . ADD (attention deficit disorder)   . Fibromyalgia   . Chicken pox as a child  . Allergy     seasonal  . Asthma     mild, intermittent  . Recurrent UTI   . Anemia     blood loss  . Hyperlipidemia   . Depression 2002    post partum  . Renal lithiasis 09/24/2010  . Concussion 09/24/2010  . History of concussion 09/24/2010  . 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 Female bladder prolapse, acquired 09/24/2010  . HTN (hypertension) 09/24/2010  . Subacute and chronic vaginitis 09/24/2010  . Lymphadenopathy 09/24/2010  . Migraine aura, persistent, with cerebral infarct, status over 72 hours 09/24/2010  . History of migraines 09/24/2010  . Chest pain, atypical 10/08/2010  . Neck pain, musculoskeletal 12/16/2010  . Poor concentration 12/16/2010  . Bronchitis, acute 12/16/2010  . Contraceptive management 02/17/2011  . Interstitial cystitis 02/18/2011  . RUQ pain 05/27/2011  . Decreased libido 09/26/2011  . Migraine 09/24/2010  . Overweight(278.02) 12/26/2011  . Abnormal cervical cytology 09/26/2011    LGSIL in 2011, patient reports repeat pap was normal, has had intermittent abnormals with bx in past but always normalized, has had cryotherapy with good results Menarche at 17, irregular without birth control at times,  very heavy LMP 08/26/2011 No gyn concerns, MGM last year normal   . Preventative health care 11/28/2012  . Headache 09/26/2013    Past Surgical History  Procedure Laterality Date  . Incontinence surgery  2003    Family History  Problem Relation Age of Onset  . Heart attack Father   . Hyperlipidemia Father   . Hypertension Father   . Alcohol abuse Father   . Heart disease Father   . Heart attack Brother 45  . Hypertension Brother   . Heart disease Brother 45    MI  . Dementia Maternal Grandfather 51    early stages  . Heart attack Paternal Grandfather   . Stroke Paternal Grandfather   . Heart disease Paternal Grandfather   . Alcohol abuse Paternal Grandfather   . Hyperlipidemia Paternal Grandfather   . Hypertension Paternal Grandfather     History   Social History  . Marital Status: Married    Spouse Name: N/A    Number of Children: N/A  . Years of Education: N/A   Occupational History  . Not on file.   Social History Main Topics  . Smoking status: Never Smoker   . Smokeless tobacco: Never Used  . Alcohol Use: Yes     Comment: rarely  . Drug Use: No  . Sexual Activity: Yes    Partners: Male    Birth Control/ Protection: Pill   Other  Topics Concern  . Not on file   Social History Narrative  . No narrative on file    Current Outpatient Prescriptions on File Prior to Visit  Medication Sig Dispense Refill  . acyclovir (ZOVIRAX) 200 MG capsule Take 1 capsule (200 mg total) by mouth 2 (two) times daily.  180 capsule  3  . albuterol (PROVENTIL HFA;VENTOLIN HFA) 108 (90 BASE) MCG/ACT inhaler Inhale 1 puff into the lungs every 4 (four) hours as needed for wheezing. 1-2 puffs po bid prn asthma, as directed by pulmonolgy  1 Inhaler  0  . Alum & Mag Hydroxide-Simeth (MAGIC MOUTHWASH W/LIDOCAINE) SOLN Take 5 mLs by mouth 3 (three) times daily as needed for mouth pain.  100 mL  2  . budesonide (PULMICORT FLEXHALER) 180 MCG/ACT inhaler Inhale 2 puffs into the lungs 2  (two) times daily.  1 Inhaler  0  . budesonide-formoterol (SYMBICORT) 160-4.5 MCG/ACT inhaler 1-2 puffs po bid prn asthma flare as directed by pulmonology  1 Inhaler  0  . cyclobenzaprine (FLEXERIL) 10 MG tablet Take 1 tablet (10 mg total) by mouth at bedtime.  30 tablet  0  . EPINEPHrine (EPI-PEN) 0.3 mg/0.3 mL DEVI Inject 0.3 mLs (0.3 mg total) into the muscle as needed.  2 Device  1  . fluconazole (DIFLUCAN) 150 MG tablet Take 1 tablet (150 mg total) by mouth once. Take after your complete your antibiotic prescription  1 tablet  0  . nebivolol (BYSTOLIC) 5 MG tablet Take 1 tablet (5 mg total) by mouth daily.  90 tablet  2  . niacin 500 MG CR capsule 1-2 tabs po at bedtime and take a lowfat snack and 81 mg aspirin 1/2 before      . Norethindrone Acetate-Ethinyl Estrad-FE (LOESTRIN 24 FE) 1-20 MG-MCG(24) tablet Take 1 tablet by mouth daily.  3 Package  3  . ondansetron (ZOFRAN) 4 MG tablet Take 1 tablet (4 mg total) by mouth every 6 (six) hours.  20 tablet  3  . pentosan polysulfate (ELMIRON) 100 MG capsule Take 1 capsule (100 mg total) by mouth 3 (three) times daily before meals.  90 capsule  5  . SYNTHROID 137 MCG tablet TAKE 1 TABLET BY MOUTH ONCE DAILY  90 tablet  0  . traMADol (ULTRAM) 50 MG tablet Take 1-2 tablets (50-100 mg total) by mouth every 6 (six) hours as needed.  20 tablet  0   No current facility-administered medications on file prior to visit.    Allergies  Allergen Reactions  . Terazol [Terconazole]     Burning sensation    Review of Systems  Review of Systems  Constitutional: Positive for malaise/fatigue. Negative for fever.  HENT: Negative for congestion.   Eyes: Negative for discharge.  Respiratory: Negative for shortness of breath.   Cardiovascular: Positive for chest pain. Negative for palpitations and leg swelling.  Gastrointestinal: Negative for nausea, abdominal pain and diarrhea.  Genitourinary: Negative for dysuria.  Musculoskeletal: Positive for joint  pain, myalgias and neck pain. Negative for falls.       Left neck and left calf pain and swelling  Skin: Negative for rash.  Neurological: Positive for headaches. Negative for loss of consciousness.  Endo/Heme/Allergies: Negative for polydipsia.  Psychiatric/Behavioral: Negative for depression and suicidal ideas. The patient is not nervous/anxious and does not have insomnia.     Objective  BP 145/103  Pulse 80  Temp(Src) 98.7 F (37.1 C) (Oral)  Ht  (1.6 m)  Wt 153 lb 9.6 oz (  69.673 kg)  BMI 27.22 kg/m2  SpO2 98%  LMP 07/27/2013  Physical Exam  Physical Exam  Constitutional: She is oriented to person, place, and time and well-developed, well-nourished, and in no distress. No distress.  HENT:  Head: Normocephalic and atraumatic.  Eyes: Conjunctivae are normal.  Neck: Neck supple. No thyromegaly present.  Cardiovascular: Normal rate, regular rhythm and normal heart sounds.   No murmur heard. Pulmonary/Chest: Effort normal and breath sounds normal. She has no wheezes.  Abdominal: She exhibits no distension and no mass.  Musculoskeletal: She exhibits no edema.  Lymphadenopathy:    She has no cervical adenopathy.  Neurological: She is alert and oriented to person, place, and time.  Skin: Skin is warm and dry. No rash noted. She is not diaphoretic.  Psychiatric: Memory, affect and judgment normal.    Lab Results  Component Value Date   TSH 1.65 09/23/2013   Lab Results  Component Value Date   WBC 6.8 09/23/2013   HGB 14.3 09/23/2013   HCT 42.0 09/23/2013   MCV 90.5 09/23/2013   PLT 200.0 09/23/2013   Lab Results  Component Value Date   CREATININE 0.85 08/26/2013   BUN 13 08/26/2013   NA 139 08/26/2013   K 4.2 08/26/2013   CL 100 08/26/2013   CO2 26 08/26/2013   Lab Results  Component Value Date   ALT 16 08/26/2013   AST 20 08/26/2013   ALKPHOS 56 08/26/2013   BILITOT 0.3 08/26/2013   Lab Results  Component Value Date   CHOL 217* 11/22/2012   Lab Results   Component Value Date   HDL 38* 11/22/2012   Lab Results  Component Value Date   LDLCALC 136* 11/22/2012   Lab Results  Component Value Date   TRIG 217* 11/22/2012   Lab Results  Component Value Date   CHOLHDL 5.7 11/22/2012     Assessment & Plan  HTN (hypertension) Elevated since a tick bite several weeks ago and with some atypical chest pain, strong family history of CAD, referred to cardiology for further consideration  Hyperlipidemia Encouraged heart healthy diet, increase exercise, avoid trans fats, consider a krill oil cap daily  Headache S/p tick bite with left facial numbness, blurry vision and left neck pain. Check lyme rmsf. Encouraged increased hydration, 64 ounces of clear fluids daily. Minimize alcohol and caffeine. Eat small frequent meals with lean proteins and complex carbs. Avoid high and low blood sugars. Get adequate sleep, 7-8 hours a night. Needs exercise daily preferably in the morning.  Hypothyroid On Levothyroxine, continue to monitor

## 2013-09-26 NOTE — Assessment & Plan Note (Signed)
On Levothyroxine, continue to monitor 

## 2013-09-26 NOTE — Assessment & Plan Note (Signed)
Elevated since a tick bite several weeks ago and with some atypical chest pain, strong family history of CAD, referred to cardiology for further consideration

## 2013-09-26 NOTE — Assessment & Plan Note (Signed)
S/p tick bite with left facial numbness, blurry vision and left neck pain. Check lyme rmsf. Encouraged increased hydration, 64 ounces of clear fluids daily. Minimize alcohol and caffeine. Eat small frequent meals with lean proteins and complex carbs. Avoid high and low blood sugars. Get adequate sleep, 7-8 hours a night. Needs exercise daily preferably in the morning.

## 2013-09-26 NOTE — Assessment & Plan Note (Signed)
Encouraged heart healthy diet, increase exercise, avoid trans fats, consider a krill oil cap daily 

## 2013-09-27 ENCOUNTER — Telehealth: Payer: Self-pay | Admitting: Family Medicine

## 2013-09-27 DIAGNOSIS — I10 Essential (primary) hypertension: Secondary | ICD-10-CM

## 2013-09-27 LAB — ROCKY MTN SPOTTED FVR ABS PNL(IGG+IGM)
RMSF IGM: 0.69 IV
RMSF IgG: 0.28 IV

## 2013-09-27 MED ORDER — NEBIVOLOL HCL 5 MG PO TABS: 5.0000 mg | ORAL_TABLET | Freq: Every day | ORAL | Status: DC

## 2013-09-27 NOTE — Telephone Encounter (Signed)
Caller name: Manette  Relation to pt: self  Call back number: [phone] Pharmacy: [facility] [address] [place], [place] [address] [phone]   Reason for call:   pt requesting a refill nebivolol (BYSTOLIC) 5 MG tablet. RX went to wrong pharmacy. Pt requesting please save pharmacy in the system. Pt has has one pill for tonight.

## 2013-09-30 LAB — EHRLICHIA ANTIBODY PANEL: E chaffeensis (HGE) Ab, IgM: <1:20 {titer}

## 2013-10-21 ENCOUNTER — Ambulatory Visit (INDEPENDENT_AMBULATORY_CARE_PROVIDER_SITE_OTHER): Payer: Managed Care, Other (non HMO) | Admitting: Cardiovascular Disease

## 2013-10-21 ENCOUNTER — Encounter: Payer: Self-pay | Admitting: Cardiovascular Disease

## 2013-10-21 VITALS — BP 132/76 | HR 62 | Ht 62.0 in | Wt 152.0 lb

## 2013-10-21 DIAGNOSIS — R079 Chest pain, unspecified: Secondary | ICD-10-CM

## 2013-10-21 DIAGNOSIS — I209 Angina pectoris, unspecified: Secondary | ICD-10-CM

## 2013-10-21 DIAGNOSIS — Z79899 Other long term (current) drug therapy: Secondary | ICD-10-CM

## 2013-10-21 DIAGNOSIS — G459 Transient cerebral ischemic attack, unspecified: Secondary | ICD-10-CM

## 2013-10-21 DIAGNOSIS — I10 Essential (primary) hypertension: Secondary | ICD-10-CM

## 2013-10-21 DIAGNOSIS — R0789 Other chest pain: Secondary | ICD-10-CM | POA: Insufficient documentation

## 2013-10-21 MED ORDER — LOSARTAN POTASSIUM 50 MG PO TABS: 50.0000 mg | ORAL_TABLET | Freq: Every day | ORAL | Status: DC

## 2013-10-21 NOTE — Progress Notes (Signed)
10/21/2013 Kristen [name]   12/21/1975  [id number]  Primary Physician Danise Edge, MD Primary Cardiologist: Runell Gess MD Kristen [name]   HPI:  Kristen [name] is a delightful 38 year old mildly overweight married Caucasian female mother of 2 children works as a Teacher, adult education. She was referred by Dr. Danise Edge for cardiovascular evaluation because of hypertension. The patient has a strong family history for disease with a father who died of a myocardial infarction at age 24 and a brother who had his first MI at age 20. She has a history of hyperlipidemia though she is resistant to taking statin therapy. She does say she has a fairly clean diet. She does have a history of hypertension on beta blockers recently with side effects of fatigue and lethargy. She has what sounds like a hypertensive urgency episode a month ago complicated by left facial numbness and chest pain. The diastolic blood pressure was 120. She had 3-5 episodes of chest pain subsequent to that.   Current Outpatient Prescriptions  Medication Sig Dispense Refill  . acyclovir (ZOVIRAX) 200 MG capsule Take 1 capsule (200 mg total) by mouth 2 (two) times daily.  180 capsule  3  . albuterol (PROVENTIL HFA;VENTOLIN HFA) 108 (90 BASE) MCG/ACT inhaler Inhale 1 puff into the lungs every 4 (four) hours as needed for wheezing. 1-2 puffs po bid prn asthma, as directed by pulmonolgy  1 Inhaler  0  . Alum & Mag Hydroxide-Simeth (MAGIC MOUTHWASH W/LIDOCAINE) SOLN Take 5 mLs by mouth 3 (three) times daily as needed for mouth pain.  100 mL  2  . amphetamine-dextroamphetamine (ADDERALL) 20 MG tablet One tablet by mouth twice daily  60 tablet  0  . aspirin 81 MG tablet Take 81 mg by mouth daily.      . budesonide (PULMICORT FLEXHALER) 180 MCG/ACT inhaler Inhale 2 puffs into the lungs 2 (two) times daily.  1 Inhaler  0  . budesonide-formoterol (SYMBICORT) 160-4.5 MCG/ACT inhaler 1-2 puffs po bid prn asthma flare as directed by  pulmonology  1 Inhaler  0  . cyclobenzaprine (FLEXERIL) 10 MG tablet Take 10 mg by mouth. As needed  neck spasms      . doxycycline (VIBRA-TABS) 100 MG tablet Take 1 tablet (100 mg total) by mouth 2 (two) times daily.  14 tablet  0  . EPINEPHrine (EPI-PEN) 0.3 mg/0.3 mL DEVI Inject 0.3 mLs (0.3 mg total) into the muscle as needed.  2 Device  1  . fluconazole (DIFLUCAN) 150 MG tablet Take 150 mg by mouth once. Take after your complete your antibiotic prescription standard as needed      . niacin 500 MG CR capsule NOT TAKING      . Norethindrone Acetate-Ethinyl Estrad-FE (LOESTRIN 24 FE) 1-20 MG-MCG(24) tablet Take 1 tablet by mouth daily.  3 Package  3  . ondansetron (ZOFRAN) 4 MG tablet Take 1 tablet (4 mg total) by mouth every 6 (six) hours.  20 tablet  3  . pentosan polysulfate (ELMIRON) 100 MG capsule Take 1 capsule (100 mg total) by mouth 3 (three) times daily before meals.  90 capsule  5  . SYNTHROID 137 MCG tablet TAKE 1 TABLET BY MOUTH ONCE DAILY  90 tablet  0  . traMADol (ULTRAM) 50 MG tablet Take 1-2 tablets (50-100 mg total) by mouth every 6 (six) hours as needed.  20 tablet  0  . losartan (COZAAR) 50 MG tablet Take 1 tablet (50 mg total) by mouth daily.  30 tablet  11  . meloxicam (MOBIC) 15 MG tablet As needed      . nitrofurantoin, macrocrystal-monohydrate, (MACROBID) 100 MG capsule As needed for bladder       No current facility-administered medications for this visit.    Allergies  Allergen Reactions  . Peanut-Containing Drug Products Anaphylaxis  . Terazol [Terconazole]     Burning sensation    History   Social History  . Marital Status: Married    Spouse Name: N/A    Number of Children: N/A  . Years of Education: N/A   Occupational History  . Not on file.   Social History Main Topics  . Smoking status: Never Smoker   . Smokeless tobacco: Never Used  . Alcohol Use: Yes     Comment: rarely  . Drug Use: No  . Sexual Activity: Yes    Partners: Male    Birth  Control/ Protection: Pill   Other Topics Concern  . Not on file   Social History Narrative  . No narrative on file     Review of Systems: General: negative for chills, fever, night sweats or weight changes.  Cardiovascular: negative for chest pain, dyspnea on exertion, edema, orthopnea, palpitations, paroxysmal nocturnal dyspnea or shortness of breath Dermatological: negative for rash Respiratory: negative for cough or wheezing Urologic: negative for hematuria Abdominal: negative for nausea, vomiting, diarrhea, bright red blood per rectum, melena, or hematemesis Neurologic: negative for visual changes, syncope, or dizziness All other systems reviewed and are otherwise negative except as noted above.    Blood pressure 132/76, pulse 62, height 5\' 2"  (1.575 m), weight 152 lb (68.947 kg).  General appearance: alert and no distress Neck: no adenopathy, no carotid bruit, no JVD, supple, symmetrical, trachea midline and thyroid not enlarged, symmetric, no tenderness/mass/nodules Lungs: clear to auscultation bilaterally Heart: regular rate and rhythm, S1, S2 normal, no murmur, click, rub or gallop Extremities: extremities normal, atraumatic, no cyanosis or edema and 2+ pedal pulses bilaterally  EKG normal sinus rhythm 62 ST or T wave changes  ASSESSMENT AND PLAN:   Hyperlipidemia She's been on niacin preparations the past. She tells me her diet is fairly clean. She is resistant to statin therapy.  HTN (hypertension) The patient is a hypertension for several years. She was on lisinopril the past which is a poorly discontinued for unknown reasons though it is not listed as an allergy. She has been on diastolic since she does not tolerate because of fatigue and lethargy. She had hypertensive urgency event a month ago and was associated with chest pain and left facial numbness. Today blood pressure is well controlled. I cannot hear an abdominal bruit. I'm going to get a renal Doppler study to  rule out renal vascular hypertension and transition her off of diastolic on to losartan.  Chest pain Kristen [name] is a strong family history of heart disease with a father who died of a myocardial infarction at age 38 and a brother who had his first MI at age 38 and another one 2 years later. She has a history of hyperlipidemia and hypertension as well. One month ago she had a hypertensive urgency event by description and had subsequent chest pain and left facial numbness. She had 3-5 episodes of chest pain subsequent to that but none in the last 2 weeks. Because of her strong family history as well as positive factors and chest pain I'm going to get a Myoview stress test to rule out an ischemic etiology.  Runell GessJonathan J. Jayten Gabbard MD FACP,FACC,FAHA, [facility] 10/21/2013 10:20 AM

## 2013-10-21 NOTE — Assessment & Plan Note (Signed)
Ms. Kristen [name] is a strong family history of heart disease with a father who died of a myocardial infarction at age 38 and a brother who had his first MI at age 38 and another one 2 years later. She has a history of hyperlipidemia and hypertension as well. One month ago she had a hypertensive urgency event by description and had subsequent chest pain and left facial numbness. She had 3-5 episodes of chest pain subsequent to that but none in the last 2 weeks. Because of her strong family history as well as positive factors and chest pain I'm going to get a Myoview stress test to rule out an ischemic etiology.

## 2013-10-21 NOTE — Patient Instructions (Addendum)
  We will see you back in follow up in 1 month with Belenda CruiseKristin and 6 weeks with Dr Allyson SabalBerry.   Dr Allyson SabalBerry has ordered: 1. Carotid Duplex- This test is an ultrasound of the carotid arteries in your neck. It looks at blood flow through these arteries that supply the brain with blood. Allow one hour for this exam. There are no restrictions or special instructions.  2. renal artery duplex- During this test, an ultrasound is used to evaluate blood flow to the kidneys. Allow one hour for this exam. Do not eat after midnight the day before and avoid carbonated beverages. Take your medications as you usually do.  3. Exercise Myoview- this is a test that looks at the blood flow to your heart muscle.  It takes approximately 2 1/2 hours. Please follow instruction sheet, as given.  4. Stop Bystolic over the next 2 weeks.  take 1/2 tablet daily for one week and then 1/2 tablet every other day for one week  5. Start Losartan 50mg  daily after you have weaned off of the Bystolic  6. Have blood work done two weeks after starting the Losartan

## 2013-10-21 NOTE — Assessment & Plan Note (Signed)
She's been on niacin preparations the past. She tells me her diet is fairly clean. She is resistant to statin therapy.

## 2013-10-21 NOTE — Assessment & Plan Note (Signed)
The patient is a hypertension for several years. She was on lisinopril the past which is a poorly discontinued for unknown reasons though it is not listed as an allergy. She has been on diastolic since she does not tolerate because of fatigue and lethargy. She had hypertensive urgency event a month ago and was associated with chest pain and left facial numbness. Today blood pressure is well controlled. I cannot hear an abdominal bruit. I'm going to get a renal Doppler study to rule out renal vascular hypertension and transition her off of diastolic on to losartan.

## 2013-10-25 ENCOUNTER — Other Ambulatory Visit: Payer: Self-pay | Admitting: Family Medicine

## 2013-10-27 ENCOUNTER — Telehealth: Payer: Self-pay | Admitting: Family Medicine

## 2013-10-27 DIAGNOSIS — B009 Herpesviral infection, unspecified: Secondary | ICD-10-CM

## 2013-10-27 NOTE — Telephone Encounter (Signed)
Caller name: Alejah  Call back number:[phone] Pharmacy: [facility].    Reason for call:  Pt is wanting this refilled lidocaine (XYLOCAINE JELLY) 2 % jelly [[id number]][[id number]] DISCONTINUED

## 2013-10-27 NOTE — Telephone Encounter (Signed)
Please advise 

## 2013-10-27 NOTE — Telephone Encounter (Signed)
I cannot find where I have prescribed this preivously so I need to know what she wants this for then can rx if appropriate

## 2013-10-28 NOTE — Telephone Encounter (Signed)
Left a detailed message on pts vmail to return my call

## 2013-11-01 NOTE — Telephone Encounter (Signed)
Caller name: Lakitha Relation to pt: Call back number:[phone] Pharmacy:  Reason for call: Pt is returning call. Pt states that  Dr Abner GreenspanBlyth did prescribed the Lidocaine in 2013 and it was prescribed for herpes break out. Please advise.

## 2013-11-02 NOTE — Telephone Encounter (Signed)
We have done the jelly before, way back, so I am willing to represcribe with same sig, same amount but make sure she understands not to put on open sores and only a small amount at a time

## 2013-11-03 MED ORDER — LIDOCAINE HCL 2 % EX GEL: CUTANEOUS | Status: DC

## 2013-11-03 NOTE — Telephone Encounter (Signed)
RX sent and left a detailed message on vm

## 2013-11-08 ENCOUNTER — Telehealth (HOSPITAL_COMMUNITY): Payer: Self-pay

## 2013-11-08 NOTE — Telephone Encounter (Signed)
Encounter complete. 

## 2013-11-09 ENCOUNTER — Telehealth (HOSPITAL_COMMUNITY): Payer: Self-pay

## 2013-11-09 NOTE — Telephone Encounter (Signed)
Encounter complete. 

## 2013-11-10 ENCOUNTER — Ambulatory Visit (HOSPITAL_COMMUNITY)
Admission: RE | Admit: 2013-11-10 | Discharge: 2013-11-10 | Disposition: A | Discharge status: Home / Self Care | Payer: Managed Care, Other (non HMO) | Source: Ambulatory Visit | Attending: Cardiology | Admitting: Cardiology

## 2013-11-10 DIAGNOSIS — R200 Anesthesia of skin: Secondary | ICD-10-CM | POA: Diagnosis not present

## 2013-11-10 DIAGNOSIS — R002 Palpitations: Secondary | ICD-10-CM | POA: Insufficient documentation

## 2013-11-10 DIAGNOSIS — R0600 Dyspnea, unspecified: Secondary | ICD-10-CM | POA: Diagnosis not present

## 2013-11-10 DIAGNOSIS — J45909 Unspecified asthma, uncomplicated: Secondary | ICD-10-CM | POA: Diagnosis present

## 2013-11-10 DIAGNOSIS — Z823 Family history of stroke: Secondary | ICD-10-CM | POA: Insufficient documentation

## 2013-11-10 DIAGNOSIS — R42 Dizziness and giddiness: Secondary | ICD-10-CM | POA: Diagnosis not present

## 2013-11-10 DIAGNOSIS — R079 Chest pain, unspecified: Secondary | ICD-10-CM | POA: Insufficient documentation

## 2013-11-10 DIAGNOSIS — Z8249 Family history of ischemic heart disease and other diseases of the circulatory system: Secondary | ICD-10-CM | POA: Diagnosis not present

## 2013-11-10 DIAGNOSIS — I4510 Unspecified right bundle-branch block: Secondary | ICD-10-CM | POA: Insufficient documentation

## 2013-11-10 DIAGNOSIS — R0609 Other forms of dyspnea: Secondary | ICD-10-CM | POA: Diagnosis present

## 2013-11-10 DIAGNOSIS — R0602 Shortness of breath: Secondary | ICD-10-CM | POA: Insufficient documentation

## 2013-11-10 MED ORDER — TECHNETIUM TC 99M SESTAMIBI GENERIC - CARDIOLITE
9.9000 | Freq: Once | INTRAVENOUS | Status: AC | PRN
  Administered 2013-11-10: 10 via INTRAVENOUS

## 2013-11-10 MED ORDER — TECHNETIUM TC 99M SESTAMIBI GENERIC - CARDIOLITE
30.9000 | Freq: Once | INTRAVENOUS | Status: AC | PRN
  Administered 2013-11-10: 30.9 via INTRAVENOUS

## 2013-11-10 NOTE — Procedures (Addendum)
[facility] [address] [place] [address] [phone]  Cardiology Nuclear Med Study  Kristen Arlina RobesM Arave is a 38 y.o. female     MRN : [id number]     DOB: 04/10/1975  Procedure Date: 11/10/2013  Nuclear Med Background Indication for Stress Test:  Evaluation for Ischemia History:  Asthma and No prior cardiac history reported;No prior NUC MPI fo rcomparison. Cardiac Risk Factors: Family History - CAD, Hypertension, Lipids, Overweight and TIA  Symptoms:  Chest Pain, Dizziness, DOE, Light-Headedness, Palpitations, SOB and Left facial numbness.   Nuclear Pre-Procedure Caffeine/Decaff Intake:  1:00am NPO After: 11am   IV Site: R Forearm  IV 0.9% NS with Angio Cath:  22g  Chest Size (in):  n/a IV Started by: Kristen OgrenAmanda Wease, RN  Height: 5\' 2"  (1.575 m)  Cup Size: C  BMI:  Body mass index is 27.79 kg/(m^2). Weight:  152 lb (68.947 kg)   Tech Comments:  n/a    Nuclear Med Study 1 or 2 day study: 1 day  Stress Test Type:  Stress  Order Authorizing Provider:  Nanetta BattyJonathan Terissa Haffey, MD   Resting Radionuclide: Technetium 666m Sestamibi  Resting Radionuclide Dose: 9.9 mCi   Stress Radionuclide:  Technetium 436m Sestamibi  Stress Radionuclide Dose: 30.9 mCi           Stress Protocol Rest HR: 78 Stress HR: 162  Rest BP: 133/87 Stress BP: 174/101  Exercise Time (min): 11 METS: 13.4   Predicted Max HR: 183 bpm % Max HR: 88.52 bpm Rate Pressure Product: [id number]  Dose of Adenosine (mg):  n/a Dose of Lexiscan: n/a mg  Dose of Atropine (mg): n/a Dose of Dobutamine: n/a mcg/kg/min (at max HR)  Stress Test Technologist: Kristen [name], CCT Nuclear Technologist: Kristen [name], CNMT   Rest Procedure:  Myocardial perfusion imaging was performed at rest 45 minutes following the intravenous administration of Technetium 646m Sestamibi. Stress Procedure:  The patient performed treadmill exercise using a Bruce  Protocol for 11 minutes. The patient stopped  due to Leg discomfort and mild fatigue and denied any chest pain.  There were no significant ST-T wave changes.  Technetium 56m Sestamibi was injected IV at peak exercise and myocardial perfusion imaging was performed after a brief delay.  Transient Ischemic Dilatation (Normal <1.22):  1.08  QGS EDV:  60 ml QGS ESV:  23 ml LV Ejection Fraction: 61%       Rest ECG: NSR-RBBB  Stress ECG: No significant change from baseline ECG  QPS Raw Data Images:  Normal; no motion artifact; normal heart/lung ratio. Stress Images:  Normal homogeneous uptake in all areas of the myocardium. Rest Images:  Normal homogeneous uptake in all areas of the myocardium. Subtraction (SDS):  No evidence of ischemia.  Impression Exercise Capacity:  Good exercise capacity. BP Response:  Normal blood pressure response. Clinical Symptoms:  Mild chest pain/dyspnea. ECG Impression:  No significant ST segment change suggestive of ischemia. Comparison with Prior Nuclear Study: No images to compare  Overall Impression:  Normal stress nuclear study.  LV Wall Motion:  NL LV Function; NL Wall Motion   Kristen GessBERRY,Kristen Veno J, MD  11/10/2013 4:16 PM

## 2013-11-11 ENCOUNTER — Encounter: Payer: Self-pay | Admitting: *Deleted

## 2013-11-11 LAB — BASIC METABOLIC PANEL
BUN: 12 mg/dL (ref 6–23)
CHLORIDE: 102 meq/L (ref 96–112)
CO2: 27 meq/L (ref 19–32)
Calcium: 9.2 mg/dL (ref 8.4–10.5)
Creat: 0.78 mg/dL (ref 0.50–1.10)
GLUCOSE: 117 mg/dL — AB (ref 70–99)
Potassium: 4.2 mEq/L (ref 3.5–5.3)
Sodium: 136 mEq/L (ref 135–145)

## 2013-11-15 ENCOUNTER — Encounter: Payer: Self-pay | Admitting: *Deleted

## 2013-11-15 ENCOUNTER — Other Ambulatory Visit: Payer: Self-pay | Admitting: Family Medicine

## 2013-11-17 ENCOUNTER — Encounter (HOSPITAL_COMMUNITY): Payer: Managed Care, Other (non HMO)

## 2013-11-24 ENCOUNTER — Ambulatory Visit (INDEPENDENT_AMBULATORY_CARE_PROVIDER_SITE_OTHER): Payer: Managed Care, Other (non HMO) | Admitting: Family Medicine

## 2013-11-24 ENCOUNTER — Encounter: Payer: Self-pay | Admitting: Family Medicine

## 2013-11-24 VITALS — BP 122/80 | HR 88 | Temp 99.1°F | Ht 62.0 in | Wt 157.8 lb

## 2013-11-24 DIAGNOSIS — W57XXXA Bitten or stung by nonvenomous insect and other nonvenomous arthropods, initial encounter: Secondary | ICD-10-CM

## 2013-11-24 DIAGNOSIS — R030 Elevated blood-pressure reading, without diagnosis of hypertension: Secondary | ICD-10-CM

## 2013-11-24 DIAGNOSIS — IMO0001 Reserved for inherently not codable concepts without codable children: Secondary | ICD-10-CM

## 2013-11-24 DIAGNOSIS — R41840 Attention and concentration deficit: Secondary | ICD-10-CM

## 2013-11-24 DIAGNOSIS — E785 Hyperlipidemia, unspecified: Secondary | ICD-10-CM

## 2013-11-24 DIAGNOSIS — D649 Anemia, unspecified: Secondary | ICD-10-CM

## 2013-11-24 DIAGNOSIS — T148 Other injury of unspecified body region: Secondary | ICD-10-CM

## 2013-11-24 DIAGNOSIS — E039 Hypothyroidism, unspecified: Secondary | ICD-10-CM

## 2013-11-24 DIAGNOSIS — I10 Essential (primary) hypertension: Secondary | ICD-10-CM

## 2013-11-24 LAB — CBC
HCT: 39.8 % (ref 36.0–46.0)
Hemoglobin: 13.2 g/dL (ref 12.0–15.0)
MCHC: 33.3 g/dL (ref 30.0–36.0)
MCV: 91.9 fl (ref 78.0–100.0)
Platelets: 217 10*3/uL (ref 150.0–400.0)
RBC: 4.33 Mil/uL (ref 3.87–5.11)
RDW: 12.9 % (ref 11.5–15.5)
WBC: 10 10*3/uL (ref 4.0–10.5)

## 2013-11-24 LAB — RENAL FUNCTION PANEL
ALBUMIN: 3.3 g/dL — AB (ref 3.5–5.2)
BUN: 13 mg/dL (ref 6–23)
CHLORIDE: 106 meq/L (ref 96–112)
CO2: 25 mEq/L (ref 19–32)
Calcium: 9.2 mg/dL (ref 8.4–10.5)
Creatinine, Ser: 0.8 mg/dL (ref 0.4–1.2)
GFR: 85.35 mL/min (ref >60.00)
Glucose, Bld: 89 mg/dL (ref 70–99)
PHOSPHORUS: 2.2 mg/dL — AB (ref 2.3–4.6)
POTASSIUM: 3.9 meq/L (ref 3.5–5.1)
SODIUM: 138 meq/L (ref 135–145)

## 2013-11-24 LAB — LIPID PANEL
Cholesterol: 228 mg/dL — ABNORMAL HIGH (ref 0–200)
HDL: 32.9 mg/dL — AB (ref >39.00)
NONHDL: 195.1
Total CHOL/HDL Ratio: 7
Triglycerides: 529 mg/dL — ABNORMAL HIGH (ref 0.0–149.0)
VLDL: 105.8 mg/dL — AB (ref 0.0–40.0)

## 2013-11-24 LAB — HEPATIC FUNCTION PANEL
ALK PHOS: 55 U/L (ref 39–117)
ALT: 14 U/L (ref 0–35)
AST: 14 U/L (ref 0–37)
Albumin: 3.3 g/dL — ABNORMAL LOW (ref 3.5–5.2)
BILIRUBIN DIRECT: 0 mg/dL (ref 0.0–0.3)
Total Bilirubin: 0.3 mg/dL (ref 0.2–1.2)
Total Protein: 7.2 g/dL (ref 6.0–8.3)

## 2013-11-24 LAB — LDL CHOLESTEROL, DIRECT: LDL DIRECT: 116.7 mg/dL

## 2013-11-24 LAB — TSH: TSH: 16.85 u[IU]/mL — AB (ref 0.35–4.50)

## 2013-11-24 MED ORDER — AMPHETAMINE-DEXTROAMPHETAMINE 20 MG PO TABS: ORAL_TABLET | ORAL | Status: DC

## 2013-11-24 MED ORDER — ACYCLOVIR 200 MG PO CAPS: 200.0000 mg | ORAL_CAPSULE | Freq: Two times a day (BID) | ORAL | Status: DC

## 2013-11-24 NOTE — Progress Notes (Signed)
Pre visit review using our clinic review tool, if applicable. No additional management support is needed unless otherwise documented below in the visit note. 

## 2013-11-24 NOTE — Progress Notes (Signed)
Patient ID: Kristen [name], female   DOB: [birth year], 38 y.o.   MRN: [id number] TAISLEY MORDAN [id number] 07/07/1975 11/24/2013      Progress Note-Follow Up  Subjective  Chief Complaint  Chief Complaint  Patient presents with  . Follow-up    8 week    HPI  Patient is a 38 year old female in today for routine medical care. She has been seen by cardiologya nd they have switched her from Bystolic to Losartan due to side effects of fatigue and sexual dysfunction and she feels much better. She did have one episode last weekend of feeling dizzy but that is an improvement. She has a carotid and reanl doppler ordered tomorrow. When she flet light headed she says her bp was 137/110. Is concerned that a tick bite has contributed to some paresthesias and visual changes. No rash, fevers. Denies CP/palp/SOB/HA/congestion/fevers/GI or GU c/o. Taking meds as prescribed Past Medical History  Diagnosis Date  . Thyroid disease   . Hypertension   . ADD (attention deficit disorder)   . Fibromyalgia   . Chicken pox as a child  . Allergy     seasonal  . Asthma     mild, intermittent  . Recurrent UTI   . Anemia     blood loss  . Hyperlipidemia   . Depression 2002    post partum  . Renal lithiasis 09/24/2010  . Concussion 09/24/2010  . History of concussion 09/24/2010  . 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 Female bladder prolapse, acquired 09/24/2010  . HTN (hypertension) 09/24/2010  . Subacute and chronic vaginitis 09/24/2010  . Lymphadenopathy 09/24/2010  . Migraine aura, persistent, with cerebral infarct, status over 72 hours 09/24/2010  . History of migraines 09/24/2010  . Chest pain, atypical 10/08/2010  . Neck pain, musculoskeletal 12/16/2010  . Poor concentration 12/16/2010  . Bronchitis, acute 12/16/2010  . Contraceptive management 02/17/2011  . Interstitial cystitis 02/18/2011  . RUQ pain 05/27/2011  . Decreased libido 09/26/2011  . Migraine 09/24/2010  . Overweight(278.02) 12/26/2011  . Abnormal cervical  cytology 09/26/2011    LGSIL in 2011, patient reports repeat pap was normal, has had intermittent abnormals with bx in past but always normalized, has had cryotherapy with good results Menarche at 17, irregular without birth control at times, very heavy LMP 08/26/2011 No gyn concerns, MGM last year normal   . Preventative health care 11/28/2012  . Headache 09/26/2013  . Family history of heart disease   . Hyperlipidemia     Past Surgical History  Procedure Laterality Date  . Incontinence surgery  2003    Family History  Problem Relation Age of Onset  . Heart attack Father   . Hyperlipidemia Father   . Hypertension Father   . Alcohol abuse Father   . Heart disease Father   . Heart attack Brother 45  . Hypertension Brother   . Heart disease Brother 45    MI  . Dementia Maternal Grandfather 36    early stages  . Heart attack Paternal Grandfather   . Stroke Paternal Grandfather   . Heart disease Paternal Grandfather   . Alcohol abuse Paternal Grandfather   . Hyperlipidemia Paternal Grandfather   . Hypertension Paternal Grandfather     History   Social History  . Marital Status: Married    Spouse Name: N/A    Number of Children: N/A  . Years of Education: N/A   Occupational History  . Not on file.   Social History Main Topics  .  Smoking status: Never Smoker   . Smokeless tobacco: Never Used  . Alcohol Use: Yes     Comment: rarely  . Drug Use: No  . Sexual Activity:    Partners: Male    Birth Control/ Protection: Pill   Other Topics Concern  . Not on file   Social History Narrative    Current Outpatient Prescriptions on File Prior to Visit  Medication Sig Dispense Refill  . acyclovir (ZOVIRAX) 200 MG capsule Take 1 capsule (200 mg total) by mouth 2 (two) times daily. 180 capsule 3  . albuterol (PROVENTIL HFA;VENTOLIN HFA) 108 (90 BASE) MCG/ACT inhaler Inhale 1 puff into the lungs every 4 (four) hours as needed for wheezing. 1-2 puffs po bid prn asthma, as  directed by pulmonolgy 1 Inhaler 0  . Alum & Mag Hydroxide-Simeth (MAGIC MOUTHWASH W/LIDOCAINE) SOLN Take 5 mLs by mouth 3 (three) times daily as needed for mouth pain. 100 mL 2  . amphetamine-dextroamphetamine (ADDERALL) 20 MG tablet One tablet by mouth twice daily 60 tablet 0  . aspirin 81 MG tablet Take 81 mg by mouth daily.    . budesonide (PULMICORT FLEXHALER) 180 MCG/ACT inhaler Inhale 2 puffs into the lungs 2 (two) times daily. 1 Inhaler 0  . budesonide-formoterol (SYMBICORT) 160-4.5 MCG/ACT inhaler 1-2 puffs po bid prn asthma flare as directed by pulmonology 1 Inhaler 0  . cyclobenzaprine (FLEXERIL) 10 MG tablet Take 10 mg by mouth. As needed  neck spasms    . doxycycline (VIBRA-TABS) 100 MG tablet Take 1 tablet (100 mg total) by mouth 2 (two) times daily. 14 tablet 0  . EPINEPHrine (EPI-PEN) 0.3 mg/0.3 mL DEVI Inject 0.3 mLs (0.3 mg total) into the muscle as needed. 2 Device 1  . fluconazole (DIFLUCAN) 150 MG tablet Take 150 mg by mouth once. Take after your complete your antibiotic prescription standard as needed    . lidocaine (XYLOCAINE JELLY) 2 % jelly Apply to affected area daily prn 30 mL 1  . LOMEDIA 24 FE 1-20 MG-MCG(24) tablet TAKE 1 TABLET BY MOUTH DAILY. 84 tablet 3  . losartan (COZAAR) 50 MG tablet Take 1 tablet (50 mg total) by mouth daily. 30 tablet 11  . meloxicam (MOBIC) 15 MG tablet As needed    . niacin 500 MG CR capsule NOT TAKING    . nitrofurantoin, macrocrystal-monohydrate, (MACROBID) 100 MG capsule As needed for bladder    . ondansetron (ZOFRAN) 4 MG tablet Take 1 tablet (4 mg total) by mouth every 6 (six) hours. 20 tablet 3  . pentosan polysulfate (ELMIRON) 100 MG capsule Take 1 capsule (100 mg total) by mouth 3 (three) times daily before meals. 90 capsule 5  . SYNTHROID 137 MCG tablet TAKE 1 TABLET BY MOUTH EVERY DAY 90 tablet 1  . traMADol (ULTRAM) 50 MG tablet Take 1-2 tablets (50-100 mg total) by mouth every 6 (six) hours as needed. 20 tablet 0   No current  facility-administered medications on file prior to visit.    Allergies  Allergen Reactions  . Peanut-Containing Drug Products Anaphylaxis  . Terazol [Terconazole]     Burning sensation    Review of Systems  Review of Systems  Constitutional: Negative for fever and malaise/fatigue.  HENT: Negative for congestion.   Eyes: Negative for discharge.  Respiratory: Negative for shortness of breath.   Cardiovascular: Negative for chest pain, palpitations and leg swelling.  Gastrointestinal: Negative for nausea, abdominal pain and diarrhea.  Genitourinary: Negative for dysuria.  Musculoskeletal: Negative for falls.  Skin:  Negative for rash.  Neurological: Negative for loss of consciousness and headaches.  Endo/Heme/Allergies: Negative for polydipsia.  Psychiatric/Behavioral: Negative for depression and suicidal ideas. The patient is not nervous/anxious and does not have insomnia.     Objective  BP 122/80 mmHg  Pulse 88  Temp(Src) 99.1 F (37.3 C) (Oral)  Ht 5\' 2"  (1.575 m)  Wt 157 lb 12.8 oz (71.578 kg)  BMI 28.85 kg/m2  SpO2 100%  LMP 11/19/2013  Physical Exam  Physical Exam  Constitutional: She is oriented to person, place, and time and well-developed, well-nourished, and in no distress. No distress.  HENT:  Head: Normocephalic and atraumatic.  Eyes: Conjunctivae are normal.  Neck: Neck supple. No thyromegaly present.  Cardiovascular: Normal rate, regular rhythm and normal heart sounds.   No murmur heard. Pulmonary/Chest: Effort normal and breath sounds normal. She has no wheezes.  Abdominal: She exhibits no distension and no mass.  Musculoskeletal: She exhibits no edema.  Lymphadenopathy:    She has no cervical adenopathy.  Neurological: She is alert and oriented to person, place, and time.  Skin: Skin is warm and dry. No rash noted. She is not diaphoretic.  Psychiatric: Memory, affect and judgment normal.    Lab Results  Component Value Date   TSH 1.65  09/23/2013   Lab Results  Component Value Date   WBC 6.8 09/23/2013   HGB 14.3 09/23/2013   HCT 42.0 09/23/2013   MCV 90.5 09/23/2013   PLT 200.0 09/23/2013   Lab Results  Component Value Date   CREATININE 0.78 11/10/2013   BUN 12 11/10/2013   NA 136 11/10/2013   K 4.2 11/10/2013   CL 102 11/10/2013   CO2 27 11/10/2013   Lab Results  Component Value Date   ALT 16 08/26/2013   AST 20 08/26/2013   ALKPHOS 56 08/26/2013   BILITOT 0.3 08/26/2013   Lab Results  Component Value Date   CHOL 217* 11/22/2012   Lab Results  Component Value Date   HDL 38* 11/22/2012   Lab Results  Component Value Date   LDLCALC 136* 11/22/2012   Lab Results  Component Value Date   TRIG 217* 11/22/2012   Lab Results  Component Value Date   CHOLHDL 5.7 11/22/2012     Assessment & Plan  HTN (hypertension) Well controlled, no changes to meds. Encouraged heart healthy diet such as the DASH diet and exercise as tolerated. Has been switched off of Bystolic secondary to fatigue and decreased sex drive. Much better on Losartan. Has felt less light headed generally although she did have an episode this past weekend. Feels well today, has some doppler studies carotid and reanl scheduled tomorrow.   Tick bite Patient concerned that intermittent paresthesias, visual changes are related to a tick bite. No c/o today. No fevers or rash  Hypothyroid Under treated. Will increase Synthroid to 150 mcg daily  Poor concentration Tolerating Adderall may continue same

## 2013-11-25 ENCOUNTER — Telehealth: Payer: Self-pay | Admitting: *Deleted

## 2013-11-25 ENCOUNTER — Ambulatory Visit (HOSPITAL_BASED_OUTPATIENT_CLINIC_OR_DEPARTMENT_OTHER)
Admission: RE | Admit: 2013-11-25 | Discharge: 2013-11-25 | Disposition: A | Discharge status: Home / Self Care | Payer: Managed Care, Other (non HMO) | Source: Ambulatory Visit | Attending: Cardiovascular Disease | Admitting: Cardiovascular Disease

## 2013-11-25 ENCOUNTER — Ambulatory Visit (HOSPITAL_COMMUNITY)
Admission: RE | Admit: 2013-11-25 | Discharge: 2013-11-25 | Disposition: A | Discharge status: Home / Self Care | Payer: Managed Care, Other (non HMO) | Source: Ambulatory Visit | Attending: Internal Medicine | Admitting: Internal Medicine

## 2013-11-25 DIAGNOSIS — I10 Essential (primary) hypertension: Secondary | ICD-10-CM | POA: Insufficient documentation

## 2013-11-25 DIAGNOSIS — G459 Transient cerebral ischemic attack, unspecified: Secondary | ICD-10-CM | POA: Diagnosis not present

## 2013-11-25 MED ORDER — LEVOTHYROXINE SODIUM 150 MCG PO TABS: 150.0000 ug | ORAL_TABLET | Freq: Every day | ORAL | Status: DC

## 2013-11-25 NOTE — Telephone Encounter (Signed)
Okay to send [employer] ResourcesKristen

## 2013-11-25 NOTE — Progress Notes (Signed)
Renal Artery Duplex Completed. °Kristen [name],RVT °

## 2013-11-25 NOTE — Telephone Encounter (Signed)
Patient was here for testing and stopped to report she has been on her new bp medicine for about one month now and her bp is still running 135/97-114. She thinks the medicine may need to be increased. When she was seen on 10-21-13 she was started on losartan 50 mg once daily. Will forward for dr berry's review.

## 2013-11-25 NOTE — Progress Notes (Signed)
Carotid Duplex Completed. °Brianna L Mazza,RVT °

## 2013-11-25 NOTE — Progress Notes (Signed)
RX sent to pharmacy, labs ordered and detailed message left on vm

## 2013-11-28 NOTE — Telephone Encounter (Signed)
lmom 

## 2013-11-29 ENCOUNTER — Encounter: Payer: Self-pay | Admitting: *Deleted

## 2013-12-04 ENCOUNTER — Encounter: Payer: Self-pay | Admitting: Family Medicine

## 2013-12-04 DIAGNOSIS — W57XXXA Bitten or stung by nonvenomous insect and other nonvenomous arthropods, initial encounter: Secondary | ICD-10-CM | POA: Insufficient documentation

## 2013-12-04 HISTORY — DX: Bitten or stung by nonvenomous insect and other nonvenomous arthropods, initial encounter: W57.XXXA

## 2013-12-04 NOTE — Assessment & Plan Note (Signed)
Well controlled, no changes to meds. Encouraged heart healthy diet such as the DASH diet and exercise as tolerated. Has been switched off of Bystolic secondary to fatigue and decreased sex drive. Much better on Losartan. Has felt less light headed generally although she did have an episode this past weekend. Feels well today, has some doppler studies carotid and reanl scheduled tomorrow.

## 2013-12-04 NOTE — Assessment & Plan Note (Signed)
Patient concerned that intermittent paresthesias, visual changes are related to a tick bite. No c/o today. No fevers or rash

## 2013-12-04 NOTE — Assessment & Plan Note (Signed)
Under treated. Will increase Synthroid to 150 mcg daily

## 2013-12-04 NOTE — Assessment & Plan Note (Signed)
Tolerating Adderall may continue same

## 2013-12-05 ENCOUNTER — Encounter: Payer: Self-pay | Admitting: *Deleted

## 2013-12-06 NOTE — Telephone Encounter (Signed)
Error-not lipid clinic, hypertension clinic.  I will mail patient a letter.

## 2013-12-06 NOTE — Telephone Encounter (Signed)
Letter mailed to patient.

## 2013-12-06 NOTE — Telephone Encounter (Signed)
Attempted to contact patient. Mailbox is full and cannot accept messages.  I will mail patient a letter explaining referral to lipid clinic and I will place referral in Epic.

## 2014-01-24 ENCOUNTER — Telehealth: Payer: Self-pay | Admitting: *Deleted

## 2014-01-24 NOTE — Telephone Encounter (Signed)
LMOM @ 6:32pm @ [phone]) asking the pt to RTC.  Unsure what the pt needed.//AB/CMA

## 2014-01-26 ENCOUNTER — Other Ambulatory Visit: Payer: Self-pay | Admitting: Family Medicine

## 2014-01-26 DIAGNOSIS — R42 Dizziness and giddiness: Secondary | ICD-10-CM

## 2014-01-26 NOTE — Telephone Encounter (Signed)
Please advise.//AB/CMA 

## 2014-01-26 NOTE — Telephone Encounter (Signed)
LMOM @ 9:05am @ [phone]) asking the pt to RTC.//AB/CMA

## 2014-01-26 NOTE — Telephone Encounter (Signed)
Need referral to a neurologist. She states that she has talked to Dr. Abner GreenspanBlyth about this. Had another TIA episode over Christmas. Best # [phone]. Insurance is [employer].

## 2014-04-20 ENCOUNTER — Telehealth: Payer: Self-pay | Admitting: Family Medicine

## 2014-04-20 NOTE — Telephone Encounter (Signed)
Pre visit letter sent  °

## 2014-04-27 ENCOUNTER — Ambulatory Visit (INDEPENDENT_AMBULATORY_CARE_PROVIDER_SITE_OTHER): Payer: Managed Care, Other (non HMO) | Admitting: Medical

## 2014-04-27 VITALS — BP 136/94 | HR 74 | Temp 98.8°F | Ht 62.0 in | Wt 153.6 lb

## 2014-04-27 DIAGNOSIS — R350 Frequency of micturition: Secondary | ICD-10-CM

## 2014-04-27 DIAGNOSIS — H6505 Acute serous otitis media, recurrent, left ear: Secondary | ICD-10-CM

## 2014-04-27 DIAGNOSIS — H109 Unspecified conjunctivitis: Secondary | ICD-10-CM | POA: Insufficient documentation

## 2014-04-27 DIAGNOSIS — H6690 Otitis media, unspecified, unspecified ear: Secondary | ICD-10-CM | POA: Insufficient documentation

## 2014-04-27 DIAGNOSIS — J45901 Unspecified asthma with (acute) exacerbation: Secondary | ICD-10-CM | POA: Diagnosis not present

## 2014-04-27 DIAGNOSIS — N390 Urinary tract infection, site not specified: Secondary | ICD-10-CM

## 2014-04-27 DIAGNOSIS — H10021 Other mucopurulent conjunctivitis, right eye: Secondary | ICD-10-CM | POA: Insufficient documentation

## 2014-04-27 DIAGNOSIS — R300 Dysuria: Secondary | ICD-10-CM

## 2014-04-27 DIAGNOSIS — H6692 Otitis media, unspecified, left ear: Secondary | ICD-10-CM | POA: Insufficient documentation

## 2014-04-27 DIAGNOSIS — R82998 Other abnormal findings in urine: Secondary | ICD-10-CM

## 2014-04-27 LAB — POCT URINALYSIS DIPSTICK
Bilirubin, UA: NEGATIVE
Blood, UA: POSITIVE
Glucose, UA: NEGATIVE
Ketones, UA: NEGATIVE
NITRITE UA: NEGATIVE
Protein, UA: NEGATIVE
Spec Grav, UA: 1.015
UROBILINOGEN UA: 0.2
pH, UA: 5

## 2014-04-27 MED ORDER — CEPHALEXIN 500 MG PO CAPS: 500.0000 mg | ORAL_CAPSULE | Freq: Three times a day (TID) | ORAL | Status: DC

## 2014-04-27 MED ORDER — PREDNISONE 20 MG PO TABS: ORAL_TABLET | ORAL | Status: DC

## 2014-04-27 MED ORDER — TOBRAMYCIN 0.3 % OP SOLN: 2.0000 [drp] | Freq: Four times a day (QID) | OPHTHALMIC | Status: DC

## 2014-04-27 NOTE — Progress Notes (Signed)
Subjective:    Patient ID: Kristen [name], female    DOB: 06/11/1975, 39 y.o.   MRN: [id number]  HPI  Pt in today reporting urinary symptoms.(1st complaint)  Dysuria- some but has IC. Frequent urination-yes. And more for past 2 days. Hesitancy-no Suprapubic pressure-Some Fever-on and off. chills-no Nausea-no Vomiting-no CVA pain-no History of UTI- Hx of some cystitis/bacteria in past. Gross hematuria-no  2nd complaint. Asthma flared about 1 wk. Last 2 days using inhaler every 2-6 hours. Usually only uses albuterol 3 times a year. Pt is on pulmicort.  This started following st and nasal congestion. Pt still has mild sore throat and lt submandilar region node mild swollen. Mild left sided ear pain.   Also pt has some recent 2 days red eyes with some mild matting in am.  lmp- finished 2 days ago.    Review of Systems  Constitutional: Positive for fever. Negative for chills and fatigue.  HENT: Positive for ear pain and sore throat.   Eyes: Positive for pain.       Matting in am both eyes.  Respiratory: Positive for cough, shortness of breath and wheezing.   Gastrointestinal: Negative for nausea, vomiting and abdominal pain.       Some suprapubic pressure.  Genitourinary: Positive for dysuria. Negative for frequency, hematuria, flank pain, difficulty urinating, vaginal pain and pelvic pain.  Musculoskeletal: Negative for back pain.  Hematological: Positive for adenopathy. Does not bruise/bleed easily.   Past Medical History  Diagnosis Date  . Thyroid disease   . Hypertension   . ADD (attention deficit disorder)   . Fibromyalgia   . Chicken pox as a child  . Allergy     seasonal  . Asthma     mild, intermittent  . Recurrent UTI   . Anemia     blood loss  . Hyperlipidemia   . Depression 2002    post partum  . Renal lithiasis 09/24/2010  . Concussion 09/24/2010  . History of concussion 09/24/2010  . 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 Female bladder prolapse, acquired  09/24/2010  . HTN (hypertension) 09/24/2010  . Subacute and chronic vaginitis 09/24/2010  . Lymphadenopathy 09/24/2010  . Migraine aura, persistent, with cerebral infarct, status over 72 hours 09/24/2010  . History of migraines 09/24/2010  . Chest pain, atypical 10/08/2010  . Neck pain, musculoskeletal 12/16/2010  . Poor concentration 12/16/2010  . Bronchitis, acute 12/16/2010  . Contraceptive management 02/17/2011  . Interstitial cystitis 02/18/2011  . RUQ pain 05/27/2011  . Decreased libido 09/26/2011  . Migraine 09/24/2010  . Overweight(278.02) 12/26/2011  . Abnormal cervical cytology 09/26/2011    LGSIL in 2011, patient reports repeat pap was normal, has had intermittent abnormals with bx in past but always normalized, has had cryotherapy with good results Menarche at 17, irregular without birth control at times, very heavy LMP 08/26/2011 No gyn concerns, MGM last year normal   . Preventative health care 11/28/2012  . Headache 09/26/2013  . Family history of heart disease   . Hyperlipidemia   . Tick bite 12/04/2013    History   Social History  . Marital Status: Married    Spouse Name: N/A  . Number of Children: N/A  . Years of Education: N/A   Occupational History  . Not on file.   Social History Main Topics  . Smoking status: Never Smoker   . Smokeless tobacco: Never Used  . Alcohol Use: Yes     Comment: rarely  . Drug Use: No  .  Sexual Activity:    Partners: Male    Birth Control/ Protection: Pill   Other Topics Concern  . Not on file   Social History Narrative    Past Surgical History  Procedure Laterality Date  . Incontinence surgery  2003    Family History  Problem Relation Age of Onset  . Heart attack Father   . Hyperlipidemia Father   . Hypertension Father   . Alcohol abuse Father   . Heart disease Father   . Heart attack Brother 45  . Hypertension Brother   . Heart disease Brother 45    MI  . Dementia Maternal Grandfather 17    early stages  . Heart  attack Paternal Grandfather   . Stroke Paternal Grandfather   . Heart disease Paternal Grandfather   . Alcohol abuse Paternal Grandfather   . Hyperlipidemia Paternal Grandfather   . Hypertension Paternal Grandfather     Allergies  Allergen Reactions  . Peanut-Containing Drug Products Anaphylaxis  . Terazol [Terconazole]     Burning sensation    Current Outpatient Prescriptions on File Prior to Visit  Medication Sig Dispense Refill  . acyclovir (ZOVIRAX) 200 MG capsule Take 1 capsule (200 mg total) by mouth 2 (two) times daily. 180 capsule 3  . albuterol (PROVENTIL HFA;VENTOLIN HFA) 108 (90 BASE) MCG/ACT inhaler Inhale 1 puff into the lungs every 4 (four) hours as needed for wheezing. 1-2 puffs po bid prn asthma, as directed by pulmonolgy 1 Inhaler 0  . Alum & Mag Hydroxide-Simeth (MAGIC MOUTHWASH W/LIDOCAINE) SOLN Take 5 mLs by mouth 3 (three) times daily as needed for mouth pain. 100 mL 2  . amphetamine-dextroamphetamine (ADDERALL) 20 MG tablet One tablet by mouth twice daily 60 tablet 0  . aspirin 81 MG tablet Take 81 mg by mouth daily.    . budesonide (PULMICORT FLEXHALER) 180 MCG/ACT inhaler Inhale 2 puffs into the lungs 2 (two) times daily. 1 Inhaler 0  . budesonide-formoterol (SYMBICORT) 160-4.5 MCG/ACT inhaler 1-2 puffs po bid prn asthma flare as directed by pulmonology 1 Inhaler 0  . cyclobenzaprine (FLEXERIL) 10 MG tablet Take 10 mg by mouth. As needed  neck spasms    . doxycycline (VIBRA-TABS) 100 MG tablet Take 1 tablet (100 mg total) by mouth 2 (two) times daily. 14 tablet 0  . EPINEPHrine (EPI-PEN) 0.3 mg/0.3 mL DEVI Inject 0.3 mLs (0.3 mg total) into the muscle as needed. 2 Device 1  . fluconazole (DIFLUCAN) 150 MG tablet Take 150 mg by mouth once. Take after your complete your antibiotic prescription standard as needed    . levothyroxine (SYNTHROID, LEVOTHROID) 150 MCG tablet Take 1 tablet (150 mcg total) by mouth daily before breakfast. Has to be Synthroid 30 tablet 3  .  lidocaine (XYLOCAINE JELLY) 2 % jelly Apply to affected area daily prn 30 mL 1  . LOMEDIA 24 FE 1-20 MG-MCG(24) tablet TAKE 1 TABLET BY MOUTH DAILY. 84 tablet 3  . losartan (COZAAR) 50 MG tablet Take 1 tablet (50 mg total) by mouth daily. 30 tablet 11  . meloxicam (MOBIC) 15 MG tablet As needed    . niacin 500 MG CR capsule NOT TAKING    . nitrofurantoin, macrocrystal-monohydrate, (MACROBID) 100 MG capsule As needed for bladder    . ondansetron (ZOFRAN) 4 MG tablet Take 1 tablet (4 mg total) by mouth every 6 (six) hours. 20 tablet 3  . pentosan polysulfate (ELMIRON) 100 MG capsule Take 1 capsule (100 mg total) by mouth 3 (three) times daily  before meals. 90 capsule 5  . traMADol (ULTRAM) 50 MG tablet Take 1-2 tablets (50-100 mg total) by mouth every 6 (six) hours as needed. 20 tablet 0   No current facility-administered medications on file prior to visit.    BP 136/94 mmHg  Pulse 74  Temp(Src) 98.8 F (37.1 C) (Oral)  Ht  (1.575 m)  Wt 153 lb 9.6 oz (69.673 kg)  BMI 28.09 kg/m2  SpO2 99%      Objective:   Physical Exam  General  Mental Status - Alert. General Appearance - Well groomed. Not in acute distress.  Skin Rashes- No Rashes.  HEENT Head- Normal. Ear Auditory Canal - Left- Normal. Right - Normal.Tympanic Membrane- Left- red. Right- Normal. Eye Sclera/Conjunctiva-  Mild injencted conjuncitiva bilateral. No discharge. Nose & Sinuses Nasal Mucosa- Left-  Boggy and Congested. Right-  Boggy and  Congested.Bilateral maxillary and frontal sinus pressure. Mouth & Throat Lips: Upper Lip- Normal: no dryness, cracking, pallor, cyanosis, or vesicular eruption. Lower Lip-Normal: no dryness, cracking, pallor, cyanosis or vesicular eruption. Buccal Mucosa- Bilateral- No Aphthous ulcers. Oropharynx- No Discharge or Erythema. Tonsils: Characteristics- Bilateral-  Erythema + Congestion. Size/Enlargement- Bilateral- No enlargement. Discharge- bilateral-None.  Neck Neck-  Supple. No Masses.Left submandibular node is mild swollen and tender.   Chest and Lung Exam Auscultation: Breath Sounds:-Clear even and unlabored.  Cardiovascular Auscultation:Rythm- Regular, rate and rhythm. Murmurs & Other Heart Sounds:Ausculatation of the heart reveal- No Murmurs.  Lymphatic Head & Neck General Head & Neck Lymphatics: Bilateral: Description- No Localized lymphadenopathy.    Abdomen Inspection:-Inspection Normal.  Palpation/Perucssion: Palpation and Percussion of the abdomen reveal-faint suprapubic  Tenderness, No Rebound tenderness, No rigidity(Guarding) and No Palpable abdominal masses.  Liver:-Normal.  Spleen:- Normal.   Back- no cva tenderness  .       Assessment & Plan:

## 2014-04-27 NOTE — Assessment & Plan Note (Addendum)
Rx cephalexin. Note this should treat throat as well which looks mild to moderate suspicious for possible strep.  Submandible lymph node mild swollen swollen from one or both areas.

## 2014-04-27 NOTE — Assessment & Plan Note (Signed)
Possible uti. Rx cephalexin. Urine culture pending.

## 2014-04-27 NOTE — Progress Notes (Signed)
Pre visit review using our clinic review tool, if applicable. No additional management support is needed unless otherwise documented below in the visit note. 

## 2014-04-27 NOTE — Patient Instructions (Addendum)
Dysuria Possible uti. Rx cephalexin. Urine culture pending.   Otitis media Rx cephalexin. Note this should treat throat as well which looks mild to moderate suspicious for possible strep.  Submandible lymph node mild swollen swollen from one or both areas.   Conjunctivitis Tobrex eye drops.   Asthma flare Continue inhalers. Depomedrol im. And prednisone for 3 day course.  Recent possible allergic component. Above should help this as well.    Follow up in 7 days or as needed

## 2014-04-27 NOTE — Assessment & Plan Note (Signed)
Tobrex eye drops.

## 2014-04-27 NOTE — Assessment & Plan Note (Signed)
Continue inhalers. Depomedrol im. And prednisone for 3 day course.  Recent possible allergic component. Above should help this as well.

## 2014-04-28 LAB — URINE CULTURE: Colony Count: 50000

## 2014-04-28 MED ORDER — METHYLPREDNISOLONE ACETATE 40 MG/ML IJ SUSP
40.0000 mg | Freq: Once | INTRAMUSCULAR | Status: AC
  Administered 2014-04-27: 40 mg via INTRAMUSCULAR

## 2014-04-28 NOTE — Addendum Note (Signed)
Addended by: Lurline HareARTER, Williard Keller E on: 04/28/2014 08:20 AM   Modules accepted: Orders

## 2014-05-10 ENCOUNTER — Telehealth: Payer: Self-pay

## 2014-05-10 NOTE — Telephone Encounter (Signed)
No answer and voicemail full unable to leave a message.

## 2014-05-11 ENCOUNTER — Ambulatory Visit (INDEPENDENT_AMBULATORY_CARE_PROVIDER_SITE_OTHER): Payer: Managed Care, Other (non HMO) | Admitting: Family Medicine

## 2014-05-11 ENCOUNTER — Telehealth: Payer: Self-pay | Admitting: *Deleted

## 2014-05-11 ENCOUNTER — Other Ambulatory Visit (HOSPITAL_COMMUNITY)
Admission: RE | Admit: 2014-05-11 | Discharge: 2014-05-11 | Disposition: A | Discharge status: Home / Self Care | Payer: Managed Care, Other (non HMO) | Source: Ambulatory Visit | Attending: Family Medicine | Admitting: Family Medicine

## 2014-05-11 ENCOUNTER — Encounter: Payer: Self-pay | Admitting: Family Medicine

## 2014-05-11 VITALS — BP 100/70 | HR 69 | Temp 98.0°F | Resp 16 | Ht 62.0 in | Wt 151.0 lb

## 2014-05-11 DIAGNOSIS — IMO0001 Reserved for inherently not codable concepts without codable children: Secondary | ICD-10-CM

## 2014-05-11 DIAGNOSIS — Z113 Encounter for screening for infections with a predominantly sexual mode of transmission: Secondary | ICD-10-CM | POA: Diagnosis present

## 2014-05-11 DIAGNOSIS — Z0000 Encounter for general adult medical examination without abnormal findings: Secondary | ICD-10-CM

## 2014-05-11 DIAGNOSIS — E039 Hypothyroidism, unspecified: Secondary | ICD-10-CM

## 2014-05-11 DIAGNOSIS — N760 Acute vaginitis: Secondary | ICD-10-CM | POA: Diagnosis present

## 2014-05-11 DIAGNOSIS — I10 Essential (primary) hypertension: Secondary | ICD-10-CM | POA: Diagnosis not present

## 2014-05-11 DIAGNOSIS — T148 Other injury of unspecified body region: Secondary | ICD-10-CM

## 2014-05-11 DIAGNOSIS — E785 Hyperlipidemia, unspecified: Secondary | ICD-10-CM

## 2014-05-11 DIAGNOSIS — R030 Elevated blood-pressure reading, without diagnosis of hypertension: Secondary | ICD-10-CM | POA: Diagnosis not present

## 2014-05-11 DIAGNOSIS — Z01411 Encounter for gynecological examination (general) (routine) with abnormal findings: Secondary | ICD-10-CM | POA: Diagnosis present

## 2014-05-11 DIAGNOSIS — R29898 Other symptoms and signs involving the musculoskeletal system: Secondary | ICD-10-CM | POA: Diagnosis not present

## 2014-05-11 DIAGNOSIS — R87619 Unspecified abnormal cytological findings in specimens from cervix uteri: Secondary | ICD-10-CM

## 2014-05-11 DIAGNOSIS — Z124 Encounter for screening for malignant neoplasm of cervix: Secondary | ICD-10-CM

## 2014-05-11 DIAGNOSIS — W57XXXA Bitten or stung by nonvenomous insect and other nonvenomous arthropods, initial encounter: Secondary | ICD-10-CM

## 2014-05-11 DIAGNOSIS — B009 Herpesviral infection, unspecified: Secondary | ICD-10-CM | POA: Diagnosis not present

## 2014-05-11 DIAGNOSIS — M25571 Pain in right ankle and joints of right foot: Secondary | ICD-10-CM

## 2014-05-11 DIAGNOSIS — J4520 Mild intermittent asthma, uncomplicated: Secondary | ICD-10-CM

## 2014-05-11 LAB — LIPID PANEL
CHOLESTEROL: 205 mg/dL — AB (ref 0–200)
HDL: 42.2 mg/dL (ref >39.00)
NonHDL: 162.8
Total CHOL/HDL Ratio: 5
Triglycerides: 294 mg/dL — ABNORMAL HIGH (ref 0.0–149.0)
VLDL: 58.8 mg/dL — ABNORMAL HIGH (ref 0.0–40.0)

## 2014-05-11 LAB — COMPREHENSIVE METABOLIC PANEL
ALT: 14 U/L (ref 0–35)
AST: 15 U/L (ref 0–37)
Albumin: 3.9 g/dL (ref 3.5–5.2)
Alkaline Phosphatase: 44 U/L (ref 39–117)
BUN: 14 mg/dL (ref 6–23)
CALCIUM: 8.9 mg/dL (ref 8.4–10.5)
CHLORIDE: 103 meq/L (ref 96–112)
CO2: 27 mEq/L (ref 19–32)
Creatinine, Ser: 0.73 mg/dL (ref 0.40–1.20)
GFR: 94.64 mL/min (ref >60.00)
Glucose, Bld: 85 mg/dL (ref 70–99)
POTASSIUM: 3.9 meq/L (ref 3.5–5.1)
Sodium: 134 mEq/L — ABNORMAL LOW (ref 135–145)
Total Bilirubin: 0.5 mg/dL (ref 0.2–1.2)
Total Protein: 6.6 g/dL (ref 6.0–8.3)

## 2014-05-11 LAB — CBC
HCT: 37.8 % (ref 36.0–46.0)
HEMOGLOBIN: 12.8 g/dL (ref 12.0–15.0)
MCHC: 33.9 g/dL (ref 30.0–36.0)
MCV: 89.8 fl (ref 78.0–100.0)
PLATELETS: 213 10*3/uL (ref 150.0–400.0)
RBC: 4.21 Mil/uL (ref 3.87–5.11)
RDW: 12.9 % (ref 11.5–15.5)
WBC: 10.7 10*3/uL — AB (ref 4.0–10.5)

## 2014-05-11 LAB — URIC ACID: Uric Acid, Serum: 4.5 mg/dL (ref 2.4–7.0)

## 2014-05-11 LAB — LDL CHOLESTEROL, DIRECT: LDL DIRECT: 122 mg/dL

## 2014-05-11 LAB — TSH: TSH: 0.24 u[IU]/mL — ABNORMAL LOW (ref 0.35–4.50)

## 2014-05-11 LAB — VITAMIN B12: Vitamin B-12: 358 pg/mL (ref 211–911)

## 2014-05-11 LAB — SEDIMENTATION RATE: Sed Rate: 14 mm/hr (ref 0–22)

## 2014-05-11 MED ORDER — LIDOCAINE HCL 2 % EX GEL: CUTANEOUS | Status: DC

## 2014-05-11 MED ORDER — AMPHETAMINE-DEXTROAMPHETAMINE 20 MG PO TABS: ORAL_TABLET | ORAL | Status: DC

## 2014-05-11 MED ORDER — BUDESONIDE-FORMOTEROL FUMARATE 160-4.5 MCG/ACT IN AERO: 2.0000 | INHALATION_SPRAY | Freq: Two times a day (BID) | RESPIRATORY_TRACT | Status: DC

## 2014-05-11 MED ORDER — SYNTHROID 137 MCG PO TABS: 137.0000 ug | ORAL_TABLET | Freq: Every day | ORAL | Status: DC

## 2014-05-11 MED ORDER — NORETHIN ACE-ETH ESTRAD-FE 1-20 MG-MCG(24) PO TABS: 1.0000 | ORAL_TABLET | Freq: Every day | ORAL | Status: DC

## 2014-05-11 MED ORDER — BUDESONIDE-FORMOTEROL FUMARATE 160-4.5 MCG/ACT IN AERO: INHALATION_SPRAY | RESPIRATORY_TRACT | Status: DC

## 2014-05-11 MED ORDER — FLUCONAZOLE 150 MG PO TABS: 150.0000 mg | ORAL_TABLET | Freq: Once | ORAL | Status: DC | PRN

## 2014-05-11 MED ORDER — HYDROCODONE-ACETAMINOPHEN 5-500 MG PO TABS: 1.0000 | ORAL_TABLET | Freq: Three times a day (TID) | ORAL | Status: DC | PRN

## 2014-05-11 NOTE — Progress Notes (Signed)
Pre visit review using our clinic review tool, if applicable. No additional management support is needed unless otherwise documented below in the visit note. 

## 2014-05-11 NOTE — Assessment & Plan Note (Signed)
Well controlled, no changes to meds. Encouraged heart healthy diet such as the DASH diet and exercise as tolerated.  °

## 2014-05-11 NOTE — Telephone Encounter (Signed)
Note from Dr. Abner GreenspanBlyth: Patient previously on Symbicort well controlled asthma for years on this med. Recently insurance required switch to PUlmicort which as not worked. Can you please look into PA for symbicort now that patient has failed Pulmicort  PA for Symbicort initiated. Awaiting determination. JG//CMA

## 2014-05-11 NOTE — Patient Instructions (Signed)
Preventive Care for Adults A healthy lifestyle and preventive care can promote health and wellness. Preventive health guidelines for women include the following key practices.  A routine yearly physical is a good way to check with your health care provider about your health and preventive screening. It is a chance to share any concerns and updates on your health and to receive a thorough exam.  Visit your dentist for a routine exam and preventive care every 6 months. Brush your teeth twice a day and floss once a day. Good oral hygiene prevents tooth decay and gum disease.  The frequency of eye exams is based on your age, health, family medical history, use of contact lenses, and other factors. Follow your health care provider's recommendations for frequency of eye exams.  Eat a healthy diet. Foods like vegetables, fruits, whole grains, low-fat dairy products, and lean protein foods contain the nutrients you need without too many calories. Decrease your intake of foods high in solid fats, added sugars, and salt. Eat the right amount of calories for you.Get information about a proper diet from your health care provider, if necessary.  Regular physical exercise is one of the most important things you can do for your health. Most adults should get at least 150 minutes of moderate-intensity exercise (any activity that increases your heart rate and causes you to sweat) each week. In addition, most adults need muscle-strengthening exercises on 2 or more days a week.  Maintain a healthy weight. The body mass index (BMI) is a screening tool to identify possible weight problems. It provides an estimate of body fat based on height and weight. Your health care provider can find your BMI and can help you achieve or maintain a healthy weight.For adults 20 years and older:  A BMI below 18.5 is considered underweight.  A BMI of 18.5 to 24.9 is normal.  A BMI of 25 to 29.9 is considered overweight.  A BMI of  30 and above is considered obese.  Maintain normal blood lipids and cholesterol levels by exercising and minimizing your intake of saturated fat. Eat a balanced diet with plenty of fruit and vegetables. Blood tests for lipids and cholesterol should begin at age 76 and be repeated every 5 years. If your lipid or cholesterol levels are high, you are over 50, or you are at high risk for heart disease, you may need your cholesterol levels checked more frequently.Ongoing high lipid and cholesterol levels should be treated with medicines if diet and exercise are not working.  If you smoke, find out from your health care provider how to quit. If you do not use tobacco, do not start.  Lung cancer screening is recommended for adults aged 22-80 years who are at high risk for developing lung cancer because of a history of smoking. A yearly low-dose CT scan of the lungs is recommended for people who have at least a 30-pack-year history of smoking and are a current smoker or have quit within the past 15 years. A pack year of smoking is smoking an average of 1 pack of cigarettes a day for 1 year (for example: 1 pack a day for 30 years or 2 packs a day for 15 years). Yearly screening should continue until the smoker has stopped smoking for at least 15 years. Yearly screening should be stopped for people who develop a health problem that would prevent them from having lung cancer treatment.  If you are pregnant, do not drink alcohol. If you are breastfeeding,  be very cautious about drinking alcohol. If you are not pregnant and choose to drink alcohol, do not have more than 1 drink per day. One drink is considered to be 12 ounces (355 mL) of beer, 5 ounces (148 mL) of wine, or 1.5 ounces (44 mL) of liquor.  Avoid use of street drugs. Do not share needles with anyone. Ask for help if you need support or instructions about stopping the use of drugs.  High blood pressure causes heart disease and increases the risk of  stroke. Your blood pressure should be checked at least every 1 to 2 years. Ongoing high blood pressure should be treated with medicines if weight loss and exercise do not work.  If you are 3-86 years old, ask your health care provider if you should take aspirin to prevent strokes.  Diabetes screening involves taking a blood sample to check your fasting blood sugar level. This should be done once every 3 years, after age 67, if you are within normal weight and without risk factors for diabetes. Testing should be considered at a younger age or be carried out more frequently if you are overweight and have at least 1 risk factor for diabetes.  Breast cancer screening is essential preventive care for women. You should practice "breast self-awareness." This means understanding the normal appearance and feel of your breasts and may include breast self-examination. Any changes detected, no matter how small, should be reported to a health care provider. Women in their 8s and 30s should have a clinical breast exam (CBE) by a health care provider as part of a regular health exam every 1 to 3 years. After age 70, women should have a CBE every year. Starting at age 25, women should consider having a mammogram (breast X-ray test) every year. Women who have a family history of breast cancer should talk to their health care provider about genetic screening. Women at a high risk of breast cancer should talk to their health care providers about having an MRI and a mammogram every year.  Breast cancer gene (BRCA)-related cancer risk assessment is recommended for women who have family members with BRCA-related cancers. BRCA-related cancers include breast, ovarian, tubal, and peritoneal cancers. Having family members with these cancers may be associated with an increased risk for harmful changes (mutations) in the breast cancer genes BRCA1 and BRCA2. Results of the assessment will determine the need for genetic counseling and  BRCA1 and BRCA2 testing.  Routine pelvic exams to screen for cancer are no longer recommended for nonpregnant women who are considered low risk for cancer of the pelvic organs (ovaries, uterus, and vagina) and who do not have symptoms. Ask your health care provider if a screening pelvic exam is right for you.  If you have had past treatment for cervical cancer or a condition that could lead to cancer, you need Pap tests and screening for cancer for at least 20 years after your treatment. If Pap tests have been discontinued, your risk factors (such as having a new sexual partner) need to be reassessed to determine if screening should be resumed. Some women have medical problems that increase the chance of getting cervical cancer. In these cases, your health care provider may recommend more frequent screening and Pap tests.  The HPV test is an additional test that may be used for cervical cancer screening. The HPV test looks for the virus that can cause the cell changes on the cervix. The cells collected during the Pap test can be  tested for HPV. The HPV test could be used to screen women aged 30 years and older, and should be used in women of any age who have unclear Pap test results. After the age of 30, women should have HPV testing at the same frequency as a Pap test.  Colorectal cancer can be detected and often prevented. Most routine colorectal cancer screening begins at the age of 50 years and continues through age 75 years. However, your health care provider may recommend screening at an earlier age if you have risk factors for colon cancer. On a yearly basis, your health care provider may provide home test kits to check for hidden blood in the stool. Use of a small camera at the end of a tube, to directly examine the colon (sigmoidoscopy or colonoscopy), can detect the earliest forms of colorectal cancer. Talk to your health care provider about this at age 50, when routine screening begins. Direct  exam of the colon should be repeated every 5-10 years through age 75 years, unless early forms of pre-cancerous polyps or small growths are found.  People who are at an increased risk for hepatitis B should be screened for this virus. You are considered at high risk for hepatitis B if:  You were born in a country where hepatitis B occurs often. Talk with your health care provider about which countries are considered high risk.  Your parents were born in a high-risk country and you have not received a shot to protect against hepatitis B (hepatitis B vaccine).  You have HIV or AIDS.  You use needles to inject street drugs.  You live with, or have sex with, someone who has hepatitis B.  You get hemodialysis treatment.  You take certain medicines for conditions like cancer, organ transplantation, and autoimmune conditions.  Hepatitis C blood testing is recommended for all people born from 1945 through 1965 and any individual with known risks for hepatitis C.  Practice safe sex. Use condoms and avoid high-risk sexual practices to reduce the spread of sexually transmitted infections (STIs). STIs include gonorrhea, chlamydia, syphilis, trichomonas, herpes, HPV, and human immunodeficiency virus (HIV). Herpes, HIV, and HPV are viral illnesses that have no cure. They can result in disability, cancer, and death.  You should be screened for sexually transmitted illnesses (STIs) including gonorrhea and chlamydia if:  You are sexually active and are younger than 24 years.  You are older than 24 years and your health care provider tells you that you are at risk for this type of infection.  Your sexual activity has changed since you were last screened and you are at an increased risk for chlamydia or gonorrhea. Ask your health care provider if you are at risk.  If you are at risk of being infected with HIV, it is recommended that you take a prescription medicine daily to prevent HIV infection. This is  called preexposure prophylaxis (PrEP). You are considered at risk if:  You are a heterosexual woman, are sexually active, and are at increased risk for HIV infection.  You take drugs by injection.  You are sexually active with a partner who has HIV.  Talk with your health care provider about whether you are at high risk of being infected with HIV. If you choose to begin PrEP, you should first be tested for HIV. You should then be tested every 3 months for as long as you are taking PrEP.  Osteoporosis is a disease in which the bones lose minerals and strength   with aging. This can result in serious bone fractures or breaks. The risk of osteoporosis can be identified using a bone density scan. Women ages 65 years and over and women at risk for fractures or osteoporosis should discuss screening with their health care providers. Ask your health care provider whether you should take a calcium supplement or vitamin D to reduce the rate of osteoporosis.  Menopause can be associated with physical symptoms and risks. Hormone replacement therapy is available to decrease symptoms and risks. You should talk to your health care provider about whether hormone replacement therapy is right for you.  Use sunscreen. Apply sunscreen liberally and repeatedly throughout the day. You should seek shade when your shadow is shorter than you. Protect yourself by wearing long sleeves, pants, a wide-brimmed hat, and sunglasses year round, whenever you are outdoors.  Once a month, do a whole body skin exam, using a mirror to look at the skin on your back. Tell your health care provider of new moles, moles that have irregular borders, moles that are larger than a pencil eraser, or moles that have changed in shape or color.  Stay current with required vaccines (immunizations).  Influenza vaccine. All adults should be immunized every year.  Tetanus, diphtheria, and acellular pertussis (Td, Tdap) vaccine. Pregnant women should  receive 1 dose of Tdap vaccine during each pregnancy. The dose should be obtained regardless of the length of time since the last dose. Immunization is preferred during the 27th-36th week of gestation. An adult who has not previously received Tdap or who does not know her vaccine status should receive 1 dose of Tdap. This initial dose should be followed by tetanus and diphtheria toxoids (Td) booster doses every 10 years. Adults with an unknown or incomplete history of completing a 3-dose immunization series with Td-containing vaccines should begin or complete a primary immunization series including a Tdap dose. Adults should receive a Td booster every 10 years.  Varicella vaccine. An adult without evidence of immunity to varicella should receive 2 doses or a second dose if she has previously received 1 dose. Pregnant females who do not have evidence of immunity should receive the first dose after pregnancy. This first dose should be obtained before leaving the health care facility. The second dose should be obtained 4-8 weeks after the first dose.  Human papillomavirus (HPV) vaccine. Females aged 13-26 years who have not received the vaccine previously should obtain the 3-dose series. The vaccine is not recommended for use in pregnant females. However, pregnancy testing is not needed before receiving a dose. If a female is found to be pregnant after receiving a dose, no treatment is needed. In that case, the remaining doses should be delayed until after the pregnancy. Immunization is recommended for any person with an immunocompromised condition through the age of 26 years if she did not get any or all doses earlier. During the 3-dose series, the second dose should be obtained 4-8 weeks after the first dose. The third dose should be obtained 24 weeks after the first dose and 16 weeks after the second dose.  Zoster vaccine. One dose is recommended for adults aged 60 years or older unless certain conditions are  present.  Measles, mumps, and rubella (MMR) vaccine. Adults born before 1957 generally are considered immune to measles and mumps. Adults born in 1957 or later should have 1 or more doses of MMR vaccine unless there is a contraindication to the vaccine or there is laboratory evidence of immunity to   each of the three diseases. A routine second dose of MMR vaccine should be obtained at least 28 days after the first dose for students attending postsecondary schools, health care workers, or international travelers. People who received inactivated measles vaccine or an unknown type of measles vaccine during 1963-1967 should receive 2 doses of MMR vaccine. People who received inactivated mumps vaccine or an unknown type of mumps vaccine before 1979 and are at high risk for mumps infection should consider immunization with 2 doses of MMR vaccine. For females of childbearing age, rubella immunity should be determined. If there is no evidence of immunity, females who are not pregnant should be vaccinated. If there is no evidence of immunity, females who are pregnant should delay immunization until after pregnancy. Unvaccinated health care workers born before 1957 who lack laboratory evidence of measles, mumps, or rubella immunity or laboratory confirmation of disease should consider measles and mumps immunization with 2 doses of MMR vaccine or rubella immunization with 1 dose of MMR vaccine.  Pneumococcal 13-valent conjugate (PCV13) vaccine. When indicated, a person who is uncertain of her immunization history and has no record of immunization should receive the PCV13 vaccine. An adult aged 19 years or older who has certain medical conditions and has not been previously immunized should receive 1 dose of PCV13 vaccine. This PCV13 should be followed with a dose of pneumococcal polysaccharide (PPSV23) vaccine. The PPSV23 vaccine dose should be obtained at least 8 weeks after the dose of PCV13 vaccine. An adult aged 19  years or older who has certain medical conditions and previously received 1 or more doses of PPSV23 vaccine should receive 1 dose of PCV13. The PCV13 vaccine dose should be obtained 1 or more years after the last PPSV23 vaccine dose.  Pneumococcal polysaccharide (PPSV23) vaccine. When PCV13 is also indicated, PCV13 should be obtained first. All adults aged 65 years and older should be immunized. An adult younger than age 65 years who has certain medical conditions should be immunized. Any person who resides in a nursing home or long-term care facility should be immunized. An adult smoker should be immunized. People with an immunocompromised condition and certain other conditions should receive both PCV13 and PPSV23 vaccines. People with human immunodeficiency virus (HIV) infection should be immunized as soon as possible after diagnosis. Immunization during chemotherapy or radiation therapy should be avoided. Routine use of PPSV23 vaccine is not recommended for American Indians, [place] Natives, or people younger than 65 years unless there are medical conditions that require PPSV23 vaccine. When indicated, people who have unknown immunization and have no record of immunization should receive PPSV23 vaccine. One-time revaccination 5 years after the first dose of PPSV23 is recommended for people aged 19-64 years who have chronic kidney failure, nephrotic syndrome, asplenia, or immunocompromised conditions. People who received 1-2 doses of PPSV23 before age 65 years should receive another dose of PPSV23 vaccine at age 65 years or later if at least 5 years have passed since the previous dose. Doses of PPSV23 are not needed for people immunized with PPSV23 at or after age 65 years.  Meningococcal vaccine. Adults with asplenia or persistent complement component deficiencies should receive 2 doses of quadrivalent meningococcal conjugate (MenACWY-D) vaccine. The doses should be obtained at least 2 months apart.  Microbiologists working with certain meningococcal bacteria, military recruits, people at risk during an outbreak, and people who travel to or live in countries with a high rate of meningitis should be immunized. A first-year college student up through age   21 years who is living in a residence hall should receive a dose if she did not receive a dose on or after her 16th birthday. Adults who have certain high-risk conditions should receive one or more doses of vaccine.  Hepatitis A vaccine. Adults who wish to be protected from this disease, have certain high-risk conditions, work with hepatitis A-infected animals, work in hepatitis A research labs, or travel to or work in countries with a high rate of hepatitis A should be immunized. Adults who were previously unvaccinated and who anticipate close contact with an international adoptee during the first 60 days after arrival in the [place] from a country with a high rate of hepatitis A should be immunized.  Hepatitis B vaccine. Adults who wish to be protected from this disease, have certain high-risk conditions, may be exposed to blood or other infectious body fluids, are household contacts or sex partners of hepatitis B positive people, are clients or workers in certain care facilities, or travel to or work in countries with a high rate of hepatitis B should be immunized.  Haemophilus influenzae type b (Hib) vaccine. A previously unvaccinated person with asplenia or sickle cell disease or having a scheduled splenectomy should receive 1 dose of Hib vaccine. Regardless of previous immunization, a recipient of a hematopoietic stem cell transplant should receive a 3-dose series 6-12 months after her successful transplant. Hib vaccine is not recommended for adults with HIV infection. Preventive Services / Frequency Ages 64 to 68 years  Blood pressure check.** / Every 1 to 2 years.  Lipid and cholesterol check.** / Every 5 years beginning at age  22.  Clinical breast exam.** / Every 3 years for women in their 88s and 53s.  BRCA-related cancer risk assessment.** / For women who have family members with a BRCA-related cancer (breast, ovarian, tubal, or peritoneal cancers).  Pap test.** / Every 2 years from ages 90 through 51. Every 3 years starting at age 21 through age 56 or 3 with a history of 3 consecutive normal Pap tests.  HPV screening.** / Every 3 years from ages 24 through ages 1 to 46 with a history of 3 consecutive normal Pap tests.  Hepatitis C blood test.** / For any individual with known risks for hepatitis C.  Skin self-exam. / Monthly.  Influenza vaccine. / Every year.  Tetanus, diphtheria, and acellular pertussis (Tdap, Td) vaccine.** / Consult your health care provider. Pregnant women should receive 1 dose of Tdap vaccine during each pregnancy. 1 dose of Td every 10 years.  Varicella vaccine.** / Consult your health care provider. Pregnant females who do not have evidence of immunity should receive the first dose after pregnancy.  HPV vaccine. / 3 doses over 6 months, if 72 and younger. The vaccine is not recommended for use in pregnant females. However, pregnancy testing is not needed before receiving a dose.  Measles, mumps, rubella (MMR) vaccine.** / You need at least 1 dose of MMR if you were born in 1957 or later. You may also need a 2nd dose. For females of childbearing age, rubella immunity should be determined. If there is no evidence of immunity, females who are not pregnant should be vaccinated. If there is no evidence of immunity, females who are pregnant should delay immunization until after pregnancy.  Pneumococcal 13-valent conjugate (PCV13) vaccine.** / Consult your health care provider.  Pneumococcal polysaccharide (PPSV23) vaccine.** / 1 to 2 doses if you smoke cigarettes or if you have certain conditions.  Meningococcal vaccine.** /  1 dose if you are age 19 to 21 years and a first-year college  student living in a residence hall, or have one of several medical conditions, you need to get vaccinated against meningococcal disease. You may also need additional booster doses.  Hepatitis A vaccine.** / Consult your health care provider.  Hepatitis B vaccine.** / Consult your health care provider.  Haemophilus influenzae type b (Hib) vaccine.** / Consult your health care provider. Ages 40 to 64 years  Blood pressure check.** / Every 1 to 2 years.  Lipid and cholesterol check.** / Every 5 years beginning at age 20 years.  Lung cancer screening. / Every year if you are aged 55-80 years and have a 30-pack-year history of smoking and currently smoke or have quit within the past 15 years. Yearly screening is stopped once you have quit smoking for at least 15 years or develop a health problem that would prevent you from having lung cancer treatment.  Clinical breast exam.** / Every year after age 40 years.  BRCA-related cancer risk assessment.** / For women who have family members with a BRCA-related cancer (breast, ovarian, tubal, or peritoneal cancers).  Mammogram.** / Every year beginning at age 40 years and continuing for as long as you are in good health. Consult with your health care provider.  Pap test.** / Every 3 years starting at age 30 years through age 65 or 70 years with a history of 3 consecutive normal Pap tests.  HPV screening.** / Every 3 years from ages 30 years through ages 65 to 70 years with a history of 3 consecutive normal Pap tests.  Fecal occult blood test (FOBT) of stool. / Every year beginning at age 50 years and continuing until age 75 years. You may not need to do this test if you get a colonoscopy every 10 years.  Flexible sigmoidoscopy or colonoscopy.** / Every 5 years for a flexible sigmoidoscopy or every 10 years for a colonoscopy beginning at age 50 years and continuing until age 75 years.  Hepatitis C blood test.** / For all people born from 1945 through  1965 and any individual with known risks for hepatitis C.  Skin self-exam. / Monthly.  Influenza vaccine. / Every year.  Tetanus, diphtheria, and acellular pertussis (Tdap/Td) vaccine.** / Consult your health care provider. Pregnant women should receive 1 dose of Tdap vaccine during each pregnancy. 1 dose of Td every 10 years.  Varicella vaccine.** / Consult your health care provider. Pregnant females who do not have evidence of immunity should receive the first dose after pregnancy.  Zoster vaccine.** / 1 dose for adults aged 60 years or older.  Measles, mumps, rubella (MMR) vaccine.** / You need at least 1 dose of MMR if you were born in 1957 or later. You may also need a 2nd dose. For females of childbearing age, rubella immunity should be determined. If there is no evidence of immunity, females who are not pregnant should be vaccinated. If there is no evidence of immunity, females who are pregnant should delay immunization until after pregnancy.  Pneumococcal 13-valent conjugate (PCV13) vaccine.** / Consult your health care provider.  Pneumococcal polysaccharide (PPSV23) vaccine.** / 1 to 2 doses if you smoke cigarettes or if you have certain conditions.  Meningococcal vaccine.** / Consult your health care provider.  Hepatitis A vaccine.** / Consult your health care provider.  Hepatitis B vaccine.** / Consult your health care provider.  Haemophilus influenzae type b (Hib) vaccine.** / Consult your health care provider. Ages 65   years and over  Blood pressure check.** / Every 1 to 2 years.  Lipid and cholesterol check.** / Every 5 years beginning at age 22 years.  Lung cancer screening. / Every year if you are aged 73-80 years and have a 30-pack-year history of smoking and currently smoke or have quit within the past 15 years. Yearly screening is stopped once you have quit smoking for at least 15 years or develop a health problem that would prevent you from having lung cancer  treatment.  Clinical breast exam.** / Every year after age 4 years.  BRCA-related cancer risk assessment.** / For women who have family members with a BRCA-related cancer (breast, ovarian, tubal, or peritoneal cancers).  Mammogram.** / Every year beginning at age 40 years and continuing for as long as you are in good health. Consult with your health care provider.  Pap test.** / Every 3 years starting at age 9 years through age 34 or 91 years with 3 consecutive normal Pap tests. Testing can be stopped between 65 and 70 years with 3 consecutive normal Pap tests and no abnormal Pap or HPV tests in the past 10 years.  HPV screening.** / Every 3 years from ages 57 years through ages 64 or 45 years with a history of 3 consecutive normal Pap tests. Testing can be stopped between 65 and 70 years with 3 consecutive normal Pap tests and no abnormal Pap or HPV tests in the past 10 years.  Fecal occult blood test (FOBT) of stool. / Every year beginning at age 15 years and continuing until age 17 years. You may not need to do this test if you get a colonoscopy every 10 years.  Flexible sigmoidoscopy or colonoscopy.** / Every 5 years for a flexible sigmoidoscopy or every 10 years for a colonoscopy beginning at age 86 years and continuing until age 71 years.  Hepatitis C blood test.** / For all people born from [birth year] through 1965 and any individual with known risks for hepatitis C.  Osteoporosis screening.** / A one-time screening for women ages 83 years and over and women at risk for fractures or osteoporosis.  Skin self-exam. / Monthly.  Influenza vaccine. / Every year.  Tetanus, diphtheria, and acellular pertussis (Tdap/Td) vaccine.** / 1 dose of Td every 10 years.  Varicella vaccine.** / Consult your health care provider.  Zoster vaccine.** / 1 dose for adults aged 61 years or older.  Pneumococcal 13-valent conjugate (PCV13) vaccine.** / Consult your health care provider.  Pneumococcal  polysaccharide (PPSV23) vaccine.** / 1 dose for all adults aged 28 years and older.  Meningococcal vaccine.** / Consult your health care provider.  Hepatitis A vaccine.** / Consult your health care provider.  Hepatitis B vaccine.** / Consult your health care provider.  Haemophilus influenzae type b (Hib) vaccine.** / Consult your health care provider. ** Family history and personal history of risk and conditions may change your health care provider's recommendations. Document Released: 02/25/2001 Document Revised: 05/16/2013 Document Reviewed: 05/27/2010 [facility] Patient Information 2015 [place], [place]. This information is not intended to replace advice given to you by your health care provider. Make sure you discuss any questions you have with your health care provider.

## 2014-05-15 ENCOUNTER — Other Ambulatory Visit: Payer: Self-pay | Admitting: Family Medicine

## 2014-05-15 LAB — CYTOLOGY - PAP

## 2014-05-15 LAB — CERVICOVAGINAL ANCILLARY ONLY
Bacterial vaginitis: POSITIVE — AB
Candida vaginitis: NEGATIVE

## 2014-05-15 MED ORDER — EPINEPHRINE 0.3 MG/0.3ML IJ SOAJ: 0.3000 mg | Freq: Once | INTRAMUSCULAR | Status: DC

## 2014-05-16 ENCOUNTER — Other Ambulatory Visit: Payer: Self-pay | Admitting: Family Medicine

## 2014-05-16 MED ORDER — METRONIDAZOLE 0.75 % VA GEL: 1.0000 | Freq: Every day | VAGINAL | Status: DC

## 2014-05-19 ENCOUNTER — Telehealth: Payer: Self-pay | Admitting: Family Medicine

## 2014-05-19 MED ORDER — METRONIDAZOLE 500 MG PO TABS: 500.0000 mg | ORAL_TABLET | Freq: Three times a day (TID) | ORAL | Status: DC

## 2014-05-19 NOTE — Telephone Encounter (Signed)
Advise on change. 

## 2014-05-19 NOTE — Telephone Encounter (Signed)
D/C the gel and sent in tablets as instructed to [facility] [address] [place].  Called the patient left a detailed message request has been done.

## 2014-05-19 NOTE — Telephone Encounter (Signed)
Caller name: Tejasvi Relation to pt: self Call back number: [phone] Pharmacy: [facility] [address]  Reason for call:   Patient states that she cannot take the gel. Would like a tablet called in. States that she talked to [place] yesterday for this.

## 2014-05-19 NOTE — Telephone Encounter (Signed)
OK can have Flagyl 500 mg tabs 1 tab po tid x 5 days, d/c gel

## 2014-05-21 ENCOUNTER — Encounter: Payer: Self-pay | Admitting: Family Medicine

## 2014-05-21 DIAGNOSIS — N760 Acute vaginitis: Secondary | ICD-10-CM

## 2014-05-21 DIAGNOSIS — R29898 Other symptoms and signs involving the musculoskeletal system: Secondary | ICD-10-CM | POA: Insufficient documentation

## 2014-05-21 HISTORY — DX: Other symptoms and signs involving the musculoskeletal system: R29.898

## 2014-05-21 HISTORY — DX: Acute vaginitis: N76.0

## 2014-05-21 NOTE — Assessment & Plan Note (Addendum)
Had a 3 week episode of weakness in left leg without any obvious cause, has resolved. Patient concerned about TIA, has been seen by neurology

## 2014-05-21 NOTE — Assessment & Plan Note (Signed)
BV on exam, patient does not tolerate gels so will try Flagyl 500 mg tid

## 2014-05-21 NOTE — Assessment & Plan Note (Signed)
Normal pap today

## 2014-05-21 NOTE — Assessment & Plan Note (Signed)
On Levothyroxine, continue to monitor 

## 2014-05-21 NOTE — Assessment & Plan Note (Signed)
Recent flare has resolved.

## 2014-05-21 NOTE — Progress Notes (Signed)
Kristen [name]  [id number] 24-Apr-1975 05/21/2014      Progress Note-Follow Up  Subjective  Chief Complaint  Chief Complaint  Patient presents with  . Annual Exam    HPI  Patient is a 39 y.o. female in today for routine medical care. Patient is in today for annual exam and has numerous complaints. She had an episode in January of 3 weeks of left leg weakness that has resolved. She is complaining of numbness and yet pain in both feet. Notes the symptoms in the left foot are in the third and fourth toe and the symptoms in the right foot on the first toe and heel. That sensation is intermittent. She had some episodes of numbness in her face previously as well but none today. She has episodes of feeling slightly lightheaded and weak. Acknowledges some ongoing anxiety. Also notes some very low-grade nausea but no vomiting. She notes each month for the last few just before her cycle. She has a mild sense of tightness in her chest but results spontaneously. No recent injury or acute illness. Denies palp/SOB/HA/fevers/ or GU c/o. Taking meds as prescribed  Past Medical History  Diagnosis Date  . Thyroid disease   . Hypertension   . ADD (attention deficit disorder)   . Fibromyalgia   . Chicken pox as a child  . Allergy     seasonal  . Asthma     mild, intermittent  . Recurrent UTI   . Anemia     blood loss  . Hyperlipidemia   . Depression 2002    post partum  . Renal lithiasis 09/24/2010  . Concussion 09/24/2010  . History of concussion 09/24/2010  . 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 Female bladder prolapse, acquired 09/24/2010  . HTN (hypertension) 09/24/2010  . Subacute and chronic vaginitis 09/24/2010  . Lymphadenopathy 09/24/2010  . Migraine aura, persistent, with cerebral infarct, status over 72 hours 09/24/2010  . History of migraines 09/24/2010  . Chest pain, atypical 10/08/2010  . Neck pain, musculoskeletal 12/16/2010  . Poor concentration 12/16/2010  . Bronchitis, acute 12/16/2010  .  Contraceptive management 02/17/2011  . Interstitial cystitis 02/18/2011  . RUQ pain 05/27/2011  . Decreased libido 09/26/2011  . Migraine 09/24/2010  . Overweight(278.02) 12/26/2011  . Abnormal cervical cytology 09/26/2011    LGSIL in 2011, patient reports repeat pap was normal, has had intermittent abnormals with bx in past but always normalized, has had cryotherapy with good results Menarche at 17, irregular without birth control at times, very heavy LMP 08/26/2011 No gyn concerns, MGM last year normal   . Preventative health care 11/28/2012  . Headache 09/26/2013  . Family history of heart disease   . Hyperlipidemia   . Tick bite 12/04/2013  . Vaginitis and vulvovaginitis 05/21/2014  . Transient left leg weakness 05/21/2014    Past Surgical History  Procedure Laterality Date  . Incontinence surgery  2003    Family History  Problem Relation Age of Onset  . Heart attack Father   . Hyperlipidemia Father   . Hypertension Father   . Alcohol abuse Father   . Heart disease Father   . Heart attack Brother 45  . Hypertension Brother   . Heart disease Brother 45    MI  . Dementia Maternal Grandfather 62    early stages  . Heart attack Paternal Grandfather   . Stroke Paternal Grandfather   . Heart disease Paternal Grandfather   . Alcohol abuse Paternal Grandfather   . Hyperlipidemia Paternal  Grandfather   . Hypertension Paternal Grandfather     History   Social History  . Marital Status: Married    Spouse Name: N/A  . Number of Children: N/A  . Years of Education: N/A   Occupational History  . Not on file.   Social History Main Topics  . Smoking status: Never Smoker   . Smokeless tobacco: Never Used  . Alcohol Use: Yes     Comment: rarely  . Drug Use: No  . Sexual Activity:    Partners: Male    Birth Control/ Protection: Pill   Other Topics Concern  . Not on file   Social History Narrative    Current Outpatient Prescriptions on File Prior to Visit  Medication Sig  Dispense Refill  . acyclovir (ZOVIRAX) 200 MG capsule Take 1 capsule (200 mg total) by mouth 2 (two) times daily. 180 capsule 3  . albuterol (PROVENTIL HFA;VENTOLIN HFA) 108 (90 BASE) MCG/ACT inhaler Inhale 1 puff into the lungs every 4 (four) hours as needed for wheezing. 1-2 puffs po bid prn asthma, as directed by pulmonolgy 1 Inhaler 0  . Alum & Mag Hydroxide-Simeth (MAGIC MOUTHWASH W/LIDOCAINE) SOLN Take 5 mLs by mouth 3 (three) times daily as needed for mouth pain. 100 mL 2  . cephALEXin (KEFLEX) 500 MG capsule Take 1 capsule (500 mg total) by mouth 3 (three) times daily. 30 capsule 0  . cyclobenzaprine (FLEXERIL) 10 MG tablet Take 10 mg by mouth. As needed  neck spasms    . nitrofurantoin, macrocrystal-monohydrate, (MACROBID) 100 MG capsule As needed for bladder    . pentosan polysulfate (ELMIRON) 100 MG capsule Take 1 capsule (100 mg total) by mouth 3 (three) times daily before meals. 90 capsule 5  . meloxicam (MOBIC) 15 MG tablet As needed     No current facility-administered medications on file prior to visit.    Allergies  Allergen Reactions  . Peanut-Containing Drug Products Anaphylaxis  . Terazol [Terconazole]     Burning sensation    Review of Systems  Review of Systems  Constitutional: Positive for malaise/fatigue. Negative for fever and chills.  HENT: Positive for congestion. Negative for hearing loss and nosebleeds.   Eyes: Negative for discharge.  Respiratory: Negative for cough, sputum production, shortness of breath and wheezing.   Cardiovascular: Negative for chest pain, palpitations and leg swelling.  Gastrointestinal: Positive for nausea. Negative for heartburn, vomiting, abdominal pain, diarrhea, constipation and blood in stool.  Genitourinary: Negative for dysuria, urgency, frequency and hematuria.  Musculoskeletal: Negative for myalgias, back pain and falls.  Skin: Negative for rash.  Neurological: Positive for dizziness. Negative for tremors, sensory change,  focal weakness, loss of consciousness, weakness and headaches.  Endo/Heme/Allergies: Negative for polydipsia. Does not bruise/bleed easily.  Psychiatric/Behavioral: Negative for depression and suicidal ideas. The patient is nervous/anxious. The patient does not have insomnia.     Objective  BP 100/70 mmHg  Pulse 69  Temp(Src) 98 F (36.7 C) (Oral)  Resp 16  Ht 5\' 2"  (1.575 m)  Wt 151 lb (68.493 kg)  BMI 27.61 kg/m2  SpO2 98%  LMP 05/05/2014  Physical Exam  Physical Exam  Constitutional: She is oriented to person, place, and time and well-developed, well-nourished, and in no distress. No distress.  HENT:  Head: Normocephalic and atraumatic.  Right Ear: External ear normal.  Left Ear: External ear normal.  Nose: Nose normal.  Mouth/Throat: Oropharynx is clear and moist. No oropharyngeal exudate.  Eyes: Conjunctivae are normal. Pupils are equal, round,  and reactive to light. Right eye exhibits no discharge. Left eye exhibits no discharge. No scleral icterus.  Neck: Normal range of motion. Neck supple. No thyromegaly present.  Cardiovascular: Normal rate, regular rhythm, normal heart sounds and intact distal pulses.   No murmur heard. Pulmonary/Chest: Effort normal and breath sounds normal. No respiratory distress. She has no wheezes. She has no rales.  Abdominal: Soft. Bowel sounds are normal. She exhibits no distension and no mass. There is no tenderness.  Genitourinary: Cervix normal, right adnexa normal and left adnexa normal. Vaginal discharge found.  Musculoskeletal: Normal range of motion. She exhibits no edema or tenderness.  Lymphadenopathy:    She has no cervical adenopathy.  Neurological: She is alert and oriented to person, place, and time. She has normal reflexes. No cranial nerve deficit. Coordination normal.  Skin: Skin is warm and dry. No rash noted. She is not diaphoretic.  Psychiatric: Mood, memory and affect normal.    Lab Results  Component Value Date    TSH 0.24* 05/11/2014   Lab Results  Component Value Date   WBC 10.7* 05/11/2014   HGB 12.8 05/11/2014   HCT 37.8 05/11/2014   MCV 89.8 05/11/2014   PLT 213.0 05/11/2014   Lab Results  Component Value Date   CREATININE 0.73 05/11/2014   BUN 14 05/11/2014   NA 134* 05/11/2014   K 3.9 05/11/2014   CL 103 05/11/2014   CO2 27 05/11/2014   Lab Results  Component Value Date   ALT 14 05/11/2014   AST 15 05/11/2014   ALKPHOS 44 05/11/2014   BILITOT 0.5 05/11/2014   Lab Results  Component Value Date   CHOL 205* 05/11/2014   Lab Results  Component Value Date   HDL 42.20 05/11/2014   Lab Results  Component Value Date   LDLCALC 136* 11/22/2012   Lab Results  Component Value Date   TRIG 294.0* 05/11/2014   Lab Results  Component Value Date   CHOLHDL 5 05/11/2014     Assessment & Plan  HTN (hypertension) Well controlled, no changes to meds. Encouraged heart healthy diet such as the DASH diet and exercise as tolerated.    Mild intermittent asthma Recent flare has resolved.    Hypothyroid On Levothyroxine, continue to monitor   Hyperlipidemia Encouraged heart healthy diet, increase exercise, avoid trans fats, consider a krill oil cap daily   Vaginitis and vulvovaginitis BV on exam, patient does not tolerate gels so will try Flagyl 500 mg tid   Preventative health care Patient encouraged to maintain heart healthy diet, regular exercise, adequate sleep. Consider daily probiotics. Take medications as prescribed. Pap today, labs ordered and reviewed   Abnormal cervical cytology Normal pap today   Transient left leg weakness Had a 3 week episode of weakness in left leg without any obvious cause, has resolved. Patient concerned about TIA, has been seen by neurology

## 2014-05-21 NOTE — Assessment & Plan Note (Signed)
Patient encouraged to maintain heart healthy diet, regular exercise, adequate sleep. Consider daily probiotics. Take medications as prescribed. Pap today, labs ordered and reviewed

## 2014-05-21 NOTE — Assessment & Plan Note (Signed)
Encouraged heart healthy diet, increase exercise, avoid trans fats, consider a krill oil cap daily 

## 2014-06-13 ENCOUNTER — Other Ambulatory Visit: Payer: Self-pay | Admitting: Family Medicine

## 2014-09-06 ENCOUNTER — Telehealth: Payer: Self-pay | Admitting: *Deleted

## 2014-09-06 NOTE — Telephone Encounter (Signed)
Prior auth for Symbicort initiated. Awaiting determination. JG//CMA

## 2014-09-08 NOTE — Telephone Encounter (Signed)
Called left message to call back 

## 2014-09-08 NOTE — Telephone Encounter (Signed)
PA denied. Covered alternatives include Advair Diskus/HFA, Advair HFA, Asmanex, QVAR, Perforomist, Serevent Diskus. Please advise. JG//CMA

## 2014-09-08 NOTE — Telephone Encounter (Signed)
Can we resubmit with a statement has failed Advair and numerous other inhaled steroids in past and see if they will approve

## 2014-09-08 NOTE — Telephone Encounter (Signed)
Please let patient know her insurance has turned down our PA on her Symbicort but they do say they will pay for Advair which is similar. I do not have that in her list as something I have given her previously. Has she tried it elsewhere? If so did she have trouble? If not is she willing to try it. They say they will not pay for the Symbicort unless we fail the Advair.

## 2014-09-08 NOTE — Telephone Encounter (Signed)
The patient stated she has tried advair and many other inhalers and none have worked.  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  She has a discount card and has only to pay $25 and will continue to do so. Does not want to change.

## 2014-09-12 NOTE — Telephone Encounter (Signed)
Statement submitted to insurance. JG//CMA

## 2014-10-02 ENCOUNTER — Other Ambulatory Visit: Payer: Self-pay | Admitting: Family Medicine

## 2014-10-24 IMAGING — CT CT ABD-PELV W/ CM
1 of 2 series · 15 of 32 positions shown, 19 images · IV contrast (100 ML OMNI 300)
Comparison: 02/11/2011

CLINICAL DATA: Right upper quadrant tenderness and guarding.  Right
lower quadrant pain.  Nausea.  White cell count 14.5.

CT ABDOMEN AND PELVIS WITH CONTRAST
TECHNIQUE: Multidetector CT imaging of the abdomen and pelvis was
performed following the standard protocol during bolus
administration of intravenous contrast.
Contrast: 100mL OMNIPAQUE IOHEXOL 300 MG/ML  SOLN

[Series 2: abd/pel with · axial · 0.65mm/px · z∈[+1244,+1604]mm · 15 of 78 slices shown, 19 images]
[im 3/78  soft-tissue]
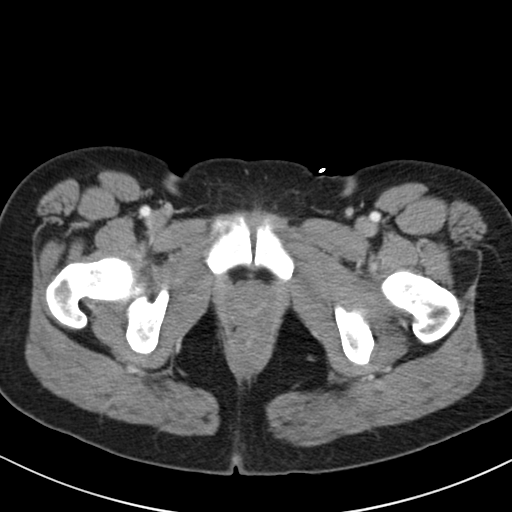
[im 3/78  bone]
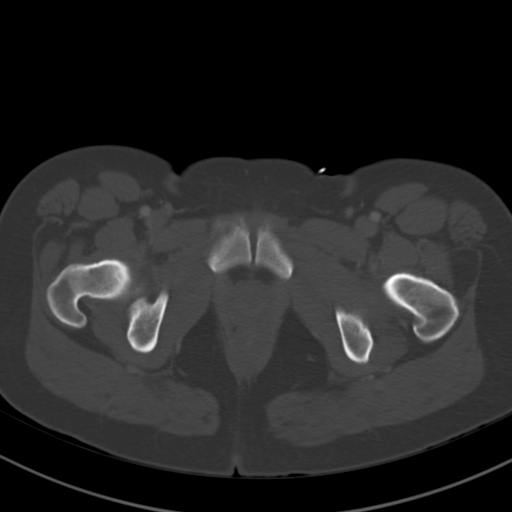
[im 9/78  soft-tissue]
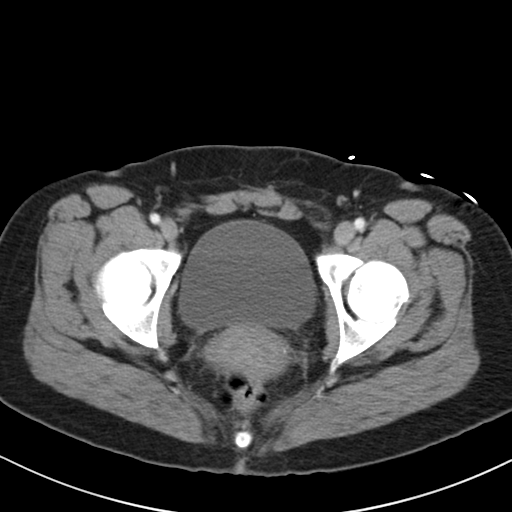
[im 15/78  soft-tissue]
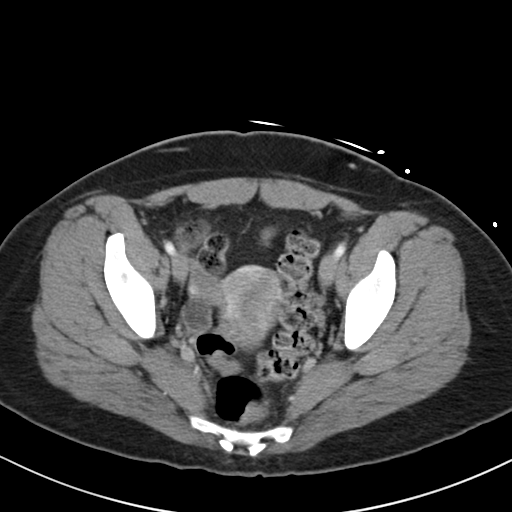
[im 21/78  soft-tissue]
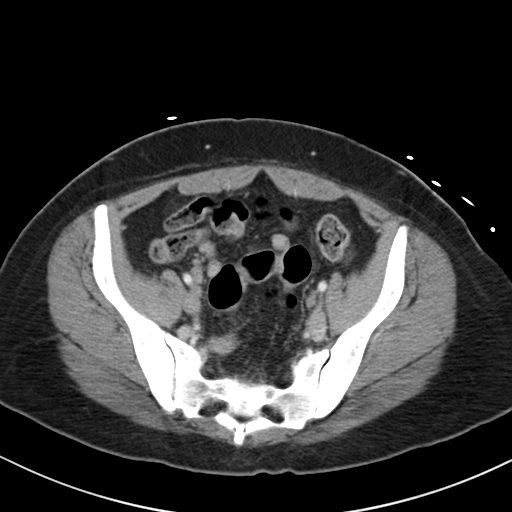
[im 27/78  soft-tissue]
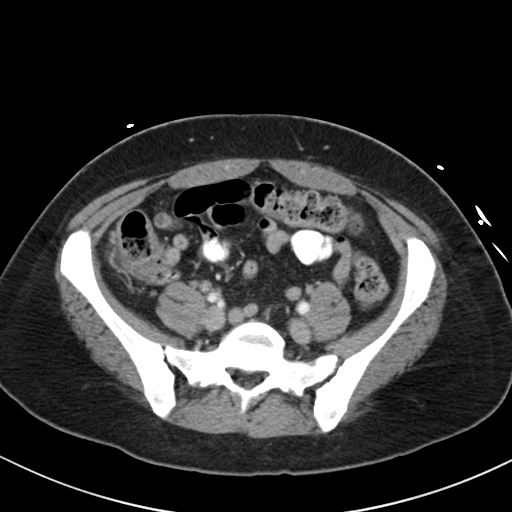
[im 33/78  soft-tissue]
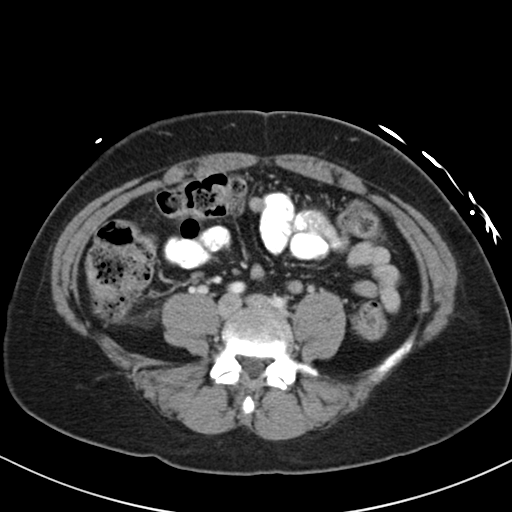
[im 39/78  soft-tissue]
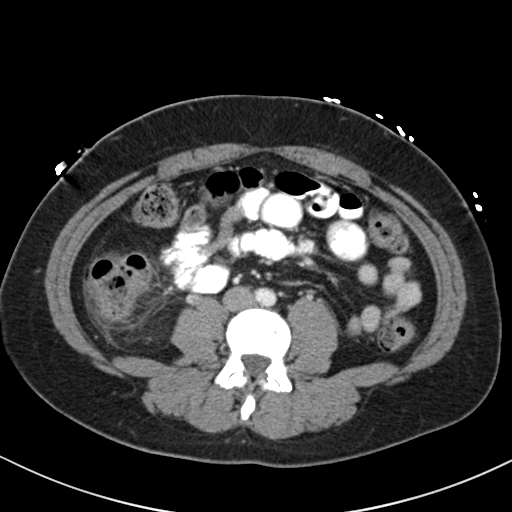
[im 45/78  soft-tissue]
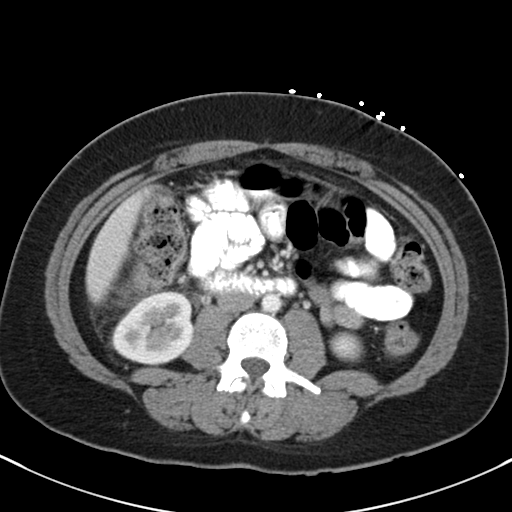
[im 51/78  soft-tissue]
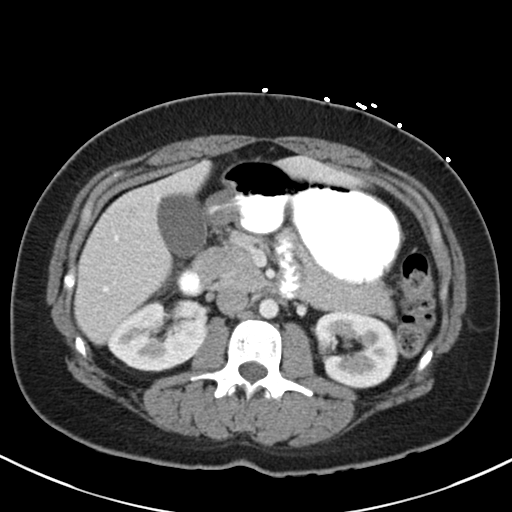
[im 51/78  bone]
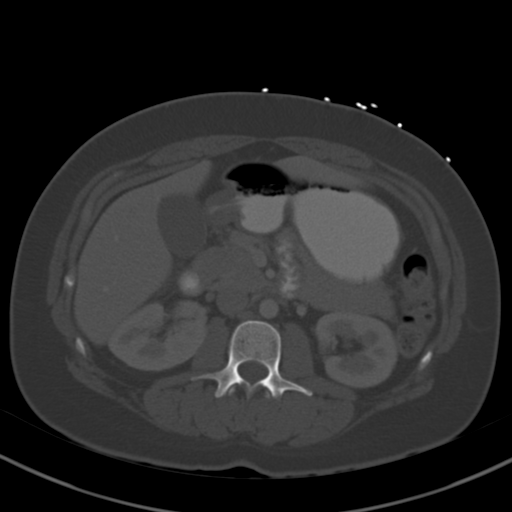
[im 57/78  soft-tissue]
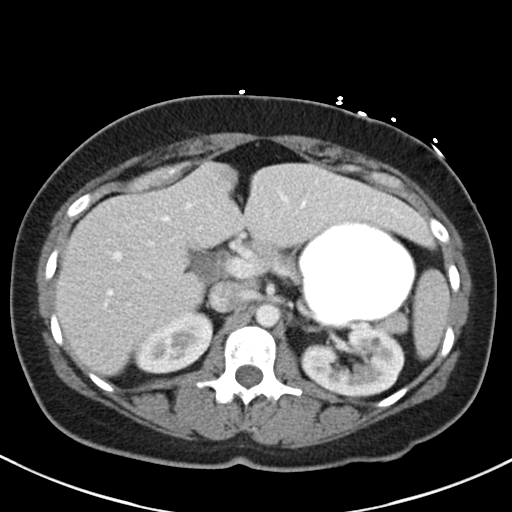
[im 63/78  soft-tissue]
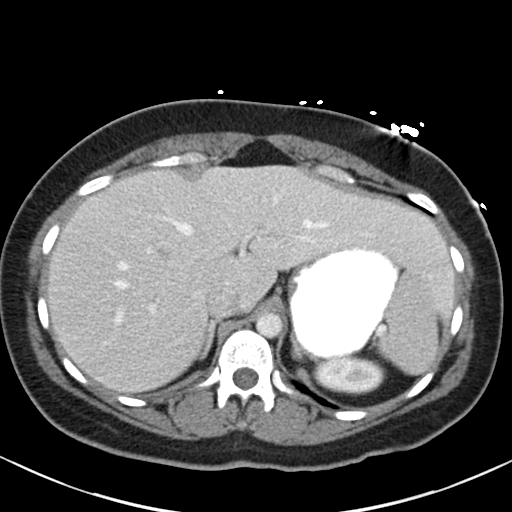
[im 66/78  lung]
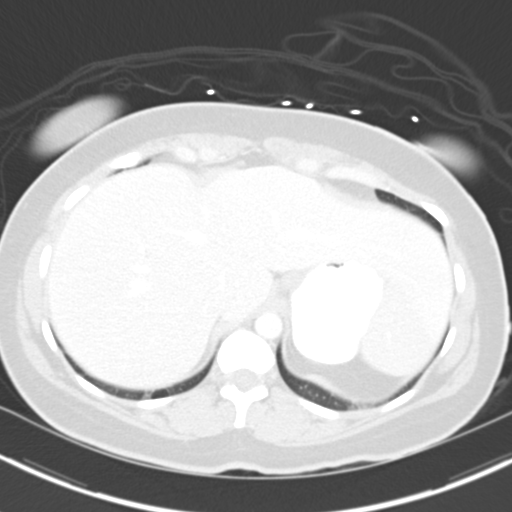
[im 69/78  soft-tissue]
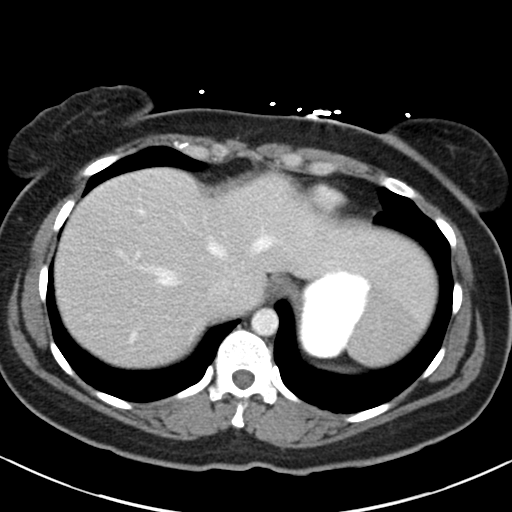
[im 69/78  lung]
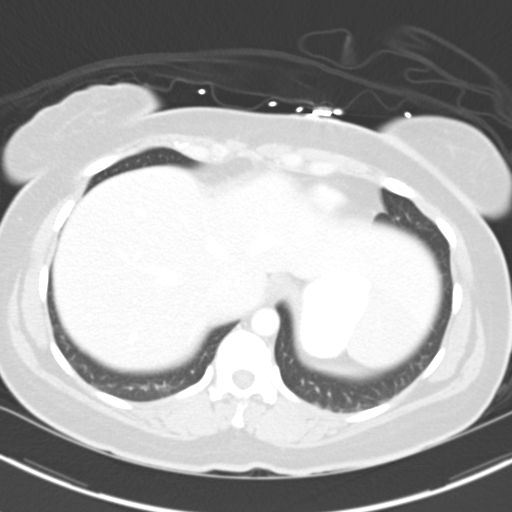
[im 72/78  lung]
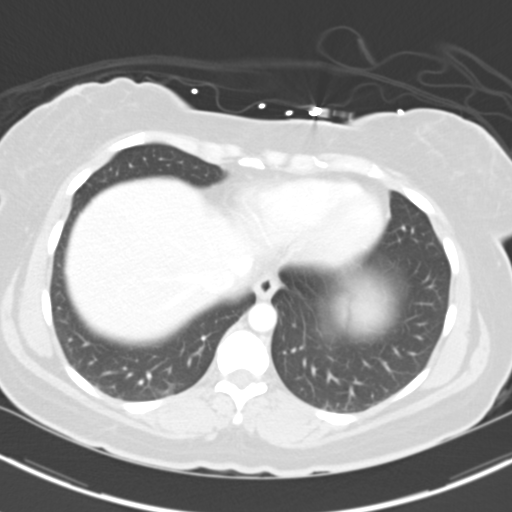
[im 75/78  soft-tissue]
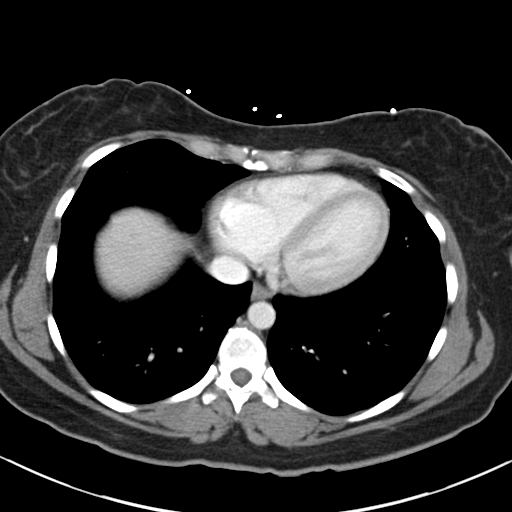
[im 75/78  lung]
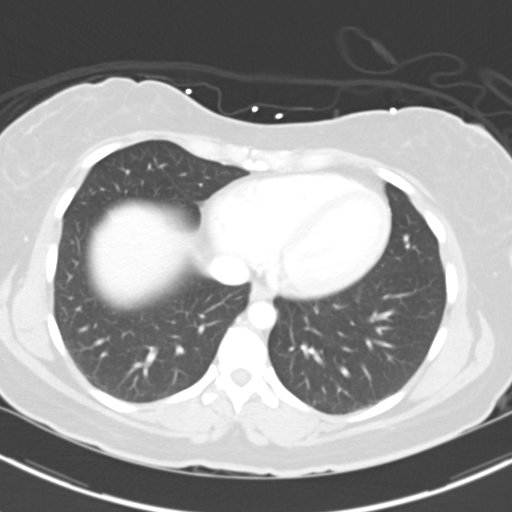

[15 of 32 positions shown; findings below may reference images not displayed]

FINDINGS: Minimal dependent changes in the lung bases.

The liver, spleen, gallbladder, pancreas, adrenal glands, kidneys,
abdominal aorta, inferior vena cava, and retroperitoneal lymph
nodes are unremarkable.  Stomach and small bowel are normally
opacified without wall thickening or distension.  No free air or
free fluid in the abdomen.

Pelvis:  Diffusely stool filled colon.  There is inflammatory
infiltration around the upper ascending colon and hepatic flexure.
The appendix is present inferior to this area and is normal. A
prominent diverticulum is noted centrally within the area of
inflammation.  Inflammatory process is likely due to right-sided
colonic diverticulitis or less likely a focal colitis.  No abscess
is demonstrated.  No extraluminal gas collections.

The uterus and ovaries are not enlarged.  The bladder wall is not
thickened.  No free or loculated pelvic fluid collections.  The
sigmoid colon is stool-filled and normal.  No significant pelvic
lymphadenopathy.  Normal alignment of the lumbar vertebrae.
IMPRESSION: Pericolonic inflammatory stranding at the hepatic flexure, likely
to represent right-sided colonic diverticulitis.  No evidence of
abscess.

## 2014-11-06 ENCOUNTER — Telehealth: Payer: Self-pay | Admitting: Family Medicine

## 2014-11-06 MED ORDER — AMOXICILLIN 500 MG PO CAPS: 500.0000 mg | ORAL_CAPSULE | Freq: Three times a day (TID) | ORAL | Status: DC

## 2014-11-06 NOTE — Telephone Encounter (Signed)
Patient informed antibiotic sent in.  Also the patient has left over from a previous ear infection--neomycin/polymiacin/B-sulfate/hydrocortison suspension 10 mL is it ok to take if so her bottle has expired and could she have a prescription sent in.

## 2014-11-06 NOTE — Telephone Encounter (Signed)
OK to send in Amoxicillin 500 mg po tid x 10 days if hi fever, worse pain or no improvement then needs to come in. Disp #30

## 2014-11-06 NOTE — Telephone Encounter (Signed)
Relation to UJ:WJXBpt:self Call back number: [phone]  [facility]: [id number] [place] - [address] [facility] [address] [phone]) [phone] (Fax)         Reason for call:   Patient states ear drum ruptured and it happens every year, patient is requesting antibiotic amoxicillin preferably. Patient states she can not make an appointment due to the fact her grandparent had a stroke and needs to stay with her. Please advise

## 2014-11-06 NOTE — Telephone Encounter (Signed)
She can have a refill on her ear drops with same sig, disp 1 bottle

## 2014-11-07 MED ORDER — NEOMYCIN-POLYMYXIN-HC 3.5-10000-1 OT SOLN: 3.0000 [drp] | Freq: Four times a day (QID) | OTIC | Status: DC

## 2014-11-07 NOTE — Telephone Encounter (Signed)
Sent in ear drops as PCP instructed. Patient informed drops sent in.

## 2014-11-09 ENCOUNTER — Ambulatory Visit (INDEPENDENT_AMBULATORY_CARE_PROVIDER_SITE_OTHER): Payer: Managed Care, Other (non HMO) | Admitting: Family Medicine

## 2014-11-09 ENCOUNTER — Encounter: Payer: Self-pay | Admitting: Family Medicine

## 2014-11-09 VITALS — BP 120/82 | HR 81 | Temp 98.2°F | Ht 62.0 in | Wt 159.0 lb

## 2014-11-09 DIAGNOSIS — R030 Elevated blood-pressure reading, without diagnosis of hypertension: Secondary | ICD-10-CM | POA: Diagnosis not present

## 2014-11-09 DIAGNOSIS — E039 Hypothyroidism, unspecified: Secondary | ICD-10-CM

## 2014-11-09 DIAGNOSIS — E785 Hyperlipidemia, unspecified: Secondary | ICD-10-CM | POA: Diagnosis not present

## 2014-11-09 DIAGNOSIS — IMO0001 Reserved for inherently not codable concepts without codable children: Secondary | ICD-10-CM

## 2014-11-09 DIAGNOSIS — W57XXXA Bitten or stung by nonvenomous insect and other nonvenomous arthropods, initial encounter: Secondary | ICD-10-CM

## 2014-11-09 DIAGNOSIS — N760 Acute vaginitis: Secondary | ICD-10-CM | POA: Diagnosis not present

## 2014-11-09 DIAGNOSIS — T148 Other injury of unspecified body region: Secondary | ICD-10-CM | POA: Diagnosis not present

## 2014-11-09 DIAGNOSIS — N390 Urinary tract infection, site not specified: Secondary | ICD-10-CM

## 2014-11-09 DIAGNOSIS — R300 Dysuria: Secondary | ICD-10-CM | POA: Diagnosis not present

## 2014-11-09 DIAGNOSIS — R109 Unspecified abdominal pain: Secondary | ICD-10-CM

## 2014-11-09 DIAGNOSIS — G43909 Migraine, unspecified, not intractable, without status migrainosus: Secondary | ICD-10-CM

## 2014-11-09 DIAGNOSIS — I10 Essential (primary) hypertension: Secondary | ICD-10-CM

## 2014-11-09 LAB — CBC
HCT: 40.5 % (ref 36.0–46.0)
HEMOGLOBIN: 13.5 g/dL (ref 12.0–15.0)
MCHC: 33.4 g/dL (ref 30.0–36.0)
MCV: 90.4 fl (ref 78.0–100.0)
PLATELETS: 216 10*3/uL (ref 150.0–400.0)
RBC: 4.47 Mil/uL (ref 3.87–5.11)
RDW: 12.7 % (ref 11.5–15.5)
WBC: 7.8 10*3/uL (ref 4.0–10.5)

## 2014-11-09 LAB — COMPREHENSIVE METABOLIC PANEL
ALK PHOS: 43 U/L (ref 39–117)
ALT: 14 U/L (ref 0–35)
AST: 16 U/L (ref 0–37)
Albumin: 3.9 g/dL (ref 3.5–5.2)
BILIRUBIN TOTAL: 0.4 mg/dL (ref 0.2–1.2)
BUN: 14 mg/dL (ref 6–23)
CALCIUM: 9.4 mg/dL (ref 8.4–10.5)
CO2: 27 mEq/L (ref 19–32)
Chloride: 105 mEq/L (ref 96–112)
Creatinine, Ser: 0.78 mg/dL (ref 0.40–1.20)
GFR: 87.44 mL/min (ref >60.00)
GLUCOSE: 102 mg/dL — AB (ref 70–99)
Potassium: 3.9 mEq/L (ref 3.5–5.1)
Sodium: 139 mEq/L (ref 135–145)
TOTAL PROTEIN: 7 g/dL (ref 6.0–8.3)

## 2014-11-09 LAB — URINALYSIS, ROUTINE W REFLEX MICROSCOPIC
Bilirubin Urine: NEGATIVE
KETONES UR: NEGATIVE
NITRITE: NEGATIVE
Specific Gravity, Urine: 1.02 (ref 1.000–1.030)
Total Protein, Urine: NEGATIVE
URINE GLUCOSE: NEGATIVE
Urobilinogen, UA: 0.2 (ref 0.0–1.0)
pH: 7 (ref 5.0–8.0)

## 2014-11-09 LAB — LIPID PANEL
CHOLESTEROL: 245 mg/dL — AB (ref 0–200)
HDL: 38 mg/dL — AB (ref >39.00)
Total CHOL/HDL Ratio: 6

## 2014-11-09 LAB — TSH: TSH: 0.36 u[IU]/mL (ref 0.35–4.50)

## 2014-11-09 LAB — T3, FREE: T3, Free: 3.4 pg/mL (ref 2.3–4.2)

## 2014-11-09 LAB — LDL CHOLESTEROL, DIRECT: Direct LDL: 107 mg/dL

## 2014-11-09 MED ORDER — AMPHETAMINE-DEXTROAMPHETAMINE 20 MG PO TABS: ORAL_TABLET | ORAL | Status: DC

## 2014-11-09 NOTE — Patient Instructions (Signed)

## 2014-11-09 NOTE — Progress Notes (Signed)
Subjective:    Patient ID: Kristen [name], female    DOB: 10/24/1975, 39 y.o.   MRN: [id number]  Chief Complaint  Patient presents with  . Follow-up    6 month    HPI Patient is in today for follow-up. She's been under a great deal of stress with her grandfather just passing away mother now on hospice. She has been helping to care for them. She needs a refill on her Adderall. She is complaining of pain and pressure in both ears as well as urinary symptoms that include urgency, frequency and dysuria. She has had some trouble with headaches and fatigue describes having right sided facial weakness during one of her her headaches. She also notes a thick white vaginal discharge but no odor. No pain or lesions associated. No new partners. Has a history of interstitial cystitis and uses home her on intermittently but is not using it presently. Denies CP/palp/SOB/HA/congestion/fevers/GI c/o. Taking meds as prescribed  Past Medical History  Diagnosis Date  . Thyroid disease   . Hypertension   . ADD (attention deficit disorder)   . Fibromyalgia   . Chicken pox as a child  . Allergy     seasonal  . Asthma     mild, intermittent  . Recurrent UTI   . Anemia     blood loss  . Hyperlipidemia   . Depression 2002    post partum  . Renal lithiasis 09/24/2010  . Concussion 09/24/2010  . History of concussion 09/24/2010  . 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 Female bladder prolapse, acquired 09/24/2010  . HTN (hypertension) 09/24/2010  . Subacute and chronic vaginitis 09/24/2010  . Lymphadenopathy 09/24/2010  . Migraine aura, persistent, with cerebral infarct, status over 72 hours 09/24/2010  . History of migraines 09/24/2010  . Chest pain, atypical 10/08/2010  . Neck pain, musculoskeletal 12/16/2010  . Poor concentration 12/16/2010  . Bronchitis, acute 12/16/2010  . Contraceptive management 02/17/2011  . Interstitial cystitis 02/18/2011  . RUQ pain 05/27/2011  . Decreased libido 09/26/2011  . Migraine 09/24/2010  .  Overweight(278.02) 12/26/2011  . Abnormal cervical cytology 09/26/2011    LGSIL in 2011, patient reports repeat pap was normal, has had intermittent abnormals with bx in past but always normalized, has had cryotherapy with good results Menarche at 17, irregular without birth control at times, very heavy LMP 08/26/2011 No gyn concerns, MGM last year normal   . Preventative health care 11/28/2012  . Headache 09/26/2013  . Family history of heart disease   . Hyperlipidemia   . Tick bite 12/04/2013  . Vaginitis and vulvovaginitis 05/21/2014  . Transient left leg weakness 05/21/2014    Past Surgical History  Procedure Laterality Date  . Incontinence surgery  2003    Family History  Problem Relation Age of Onset  . Heart attack Father   . Hyperlipidemia Father   . Hypertension Father   . Alcohol abuse Father   . Heart disease Father   . Heart attack Brother 45  . Hypertension Brother   . Heart disease Brother 45    MI  . Dementia Maternal Grandfather [id number]    early stages  . Heart attack Paternal Grandfather   . Stroke Paternal Grandfather   . Heart disease Paternal Grandfather   . Alcohol abuse Paternal Grandfather   . Hyperlipidemia Paternal Grandfather   . Hypertension Paternal Grandfather     Social History   Social History  . Marital Status: Married    Spouse Name: N/A  .  Number of Children: N/A  . Years of Education: N/A   Occupational History  . Not on file.   Social History Main Topics  . Smoking status: Never Smoker   . Smokeless tobacco: Never Used  . Alcohol Use: Yes     Comment: rarely  . Drug Use: No  . Sexual Activity:    Partners: Male    Birth Control/ Protection: Pill   Other Topics Concern  . Not on file   Social History Narrative    Outpatient Prescriptions Prior to Visit  Medication Sig Dispense Refill  . acyclovir (ZOVIRAX) 200 MG capsule Take 1 capsule (200 mg total) by mouth 2 (two) times daily. 180 capsule 3  . albuterol (PROVENTIL  HFA;VENTOLIN HFA) 108 (90 BASE) MCG/ACT inhaler Inhale 1 puff into the lungs every 4 (four) hours as needed for wheezing. 1-2 puffs po bid prn asthma, as directed by pulmonolgy 1 Inhaler 0  . Alum & Mag Hydroxide-Simeth (MAGIC MOUTHWASH W/LIDOCAINE) SOLN Take 5 mLs by mouth 3 (three) times daily as needed for mouth pain. 100 mL 2  . amoxicillin (AMOXIL) 500 MG capsule Take 1 capsule (500 mg total) by mouth 3 (three) times daily. Take for 10 days 30 capsule 0  . BLISOVI 24 FE 1-20 MG-MCG(24) tablet TAKE 1 TABLET BY MOUTH DAILY. 84 tablet 3  . budesonide-formoterol (SYMBICORT) 160-4.5 MCG/ACT inhaler 1-2 puffs po bid prn asthma flare as directed by pulmonology 1 Inhaler 1  . budesonide-formoterol (SYMBICORT) 160-4.5 MCG/ACT inhaler Inhale 2 puffs into the lungs 2 (two) times daily. 1 Inhaler 3  . BYSTOLIC 5 MG tablet     . BYSTOLIC 5 MG tablet TAKE 1 TABLET BY MOUTH ONCE DAILY 90 tablet 2  . cephALEXin (KEFLEX) 500 MG capsule Take 1 capsule (500 mg total) by mouth 3 (three) times daily. 30 capsule 0  . cyclobenzaprine (FLEXERIL) 10 MG tablet Take 10 mg by mouth. As needed  neck spasms    . EPINEPHrine 0.3 mg/0.3 mL IJ SOAJ injection Inject 0.3 mLs (0.3 mg total) into the muscle once. 1 Device 2  . fluconazole (DIFLUCAN) 150 MG tablet Take 1 tablet (150 mg total) by mouth once as needed. Take after your complete your antibiotic prescription standard as needed 12 tablet 1  . HYDROcodone-acetaminophen (VICODIN) 5-500 MG per tablet Take 1 tablet by mouth every 8 (eight) hours as needed for pain. 120 tablet 0  . lidocaine (XYLOCAINE) 2 % jelly Apply to affected area daily prn 30 mL 1  . meloxicam (MOBIC) 15 MG tablet As needed    . metroNIDAZOLE (FLAGYL) 500 MG tablet Take 1 tablet (500 mg total) by mouth 3 (three) times daily. 15 tablet 0  . neomycin-polymyxin-hydrocortisone (CORTISPORIN) otic solution Place 3 drops into both ears 4 (four) times daily. 10 mL 1  . nitrofurantoin, macrocrystal-monohydrate,  (MACROBID) 100 MG capsule As needed for bladder    . Norethindrone Acetate-Ethinyl Estrad-FE (LOMEDIA 24 FE) 1-20 MG-MCG(24) tablet Take 1 tablet by mouth daily. 84 tablet 3  . pentosan polysulfate (ELMIRON) 100 MG capsule Take 1 capsule (100 mg total) by mouth 3 (three) times daily before meals. 90 capsule 5  . SYNTHROID 137 MCG tablet Take 1 tablet (137 mcg total) by mouth daily. 90 tablet 1  . SYNTHROID 137 MCG tablet TAKE 1 TABLET BY MOUTH EVERY DAY 90 tablet 1  . amphetamine-dextroamphetamine (ADDERALL) 20 MG tablet One tablet by mouth twice daily 60 tablet 0   No facility-administered medications prior to visit.  Allergies  Allergen Reactions  . Peanut-Containing Drug Products Anaphylaxis  . Terazol [Terconazole]     Burning sensation    Review of Systems  Constitutional: Positive for malaise/fatigue. Negative for fever.  HENT: Positive for ear discharge and ear pain. Negative for congestion.        Hearing is muffled R>L, reports a long history  Eyes: Negative for discharge.  Respiratory: Negative for shortness of breath.   Cardiovascular: Negative for chest pain, palpitations and leg swelling.  Gastrointestinal: Positive for abdominal pain. Negative for nausea.  Genitourinary: Positive for dysuria, urgency and frequency. Negative for hematuria and flank pain.  Musculoskeletal: Negative for myalgias and falls.  Skin: Negative for rash.  Neurological: Negative for loss of consciousness and headaches.  Endo/Heme/Allergies: Negative for environmental allergies.  Psychiatric/Behavioral: Negative for depression. The patient is not nervous/anxious.        Objective:    Physical Exam  Constitutional: She is oriented to person, place, and time. She appears well-developed and well-nourished. No distress.  HENT:  Head: Normocephalic and atraumatic.  Nose: Nose normal.  TMs dull and retracted  Eyes: Right eye exhibits no discharge. Left eye exhibits no discharge.  Neck:  Normal range of motion. Neck supple.  Cardiovascular: Normal rate and regular rhythm.   No murmur heard. Pulmonary/Chest: Effort normal and breath sounds normal.  Abdominal: Soft. Bowel sounds are normal. There is no tenderness.  Musculoskeletal: She exhibits no edema.  Neurological: She is alert and oriented to person, place, and time.  Skin: Skin is warm and dry.  Psychiatric: She has a normal mood and affect.  Nursing note and vitals reviewed.   BP 120/82 mmHg  Pulse 81  Temp(Src) 98.2 F (36.8 C) (Oral)  Ht  (1.575 m)  Wt 159 lb (72.122 kg)  BMI 29.07 kg/m2  SpO2 98% Wt Readings from Last 3 Encounters:  11/09/14 159 lb (72.122 kg)  05/11/14 151 lb (68.493 kg)  04/27/14 153 lb 9.6 oz (69.673 kg)     Lab Results  Component Value Date   WBC 7.8 11/09/2014   HGB 13.5 11/09/2014   HCT 40.5 11/09/2014   PLT 216.0 11/09/2014   GLUCOSE 102* 11/09/2014   CHOL 245* 11/09/2014   TRIG * 11/09/2014    483.0 Triglyceride is over 400; calculations on Lipids are invalid.   HDL 38.00* 11/09/2014   LDLDIRECT 107.0 11/09/2014   LDLCALC 136* 11/22/2012   ALT 14 11/09/2014   AST 16 11/09/2014   NA 139 11/09/2014   K 3.9 11/09/2014   CL 105 11/09/2014   CREATININE 0.78 11/09/2014   BUN 14 11/09/2014   CO2 27 11/09/2014   TSH 0.36 11/09/2014    Lab Results  Component Value Date   TSH 0.36 11/09/2014   Lab Results  Component Value Date   WBC 7.8 11/09/2014   HGB 13.5 11/09/2014   HCT 40.5 11/09/2014   MCV 90.4 11/09/2014   PLT 216.0 11/09/2014   Lab Results  Component Value Date   NA 139 11/09/2014   K 3.9 11/09/2014   CO2 27 11/09/2014   GLUCOSE 102* 11/09/2014   BUN 14 11/09/2014   CREATININE 0.78 11/09/2014   BILITOT 0.4 11/09/2014   ALKPHOS 43 11/09/2014   AST 16 11/09/2014   ALT 14 11/09/2014   PROT 7.0 11/09/2014   ALBUMIN 3.9 11/09/2014   CALCIUM 9.4 11/09/2014   GFR 87.44 11/09/2014   Lab Results  Component Value Date   CHOL 245* 11/09/2014  Lab Results  Component Value Date   HDL 38.00* 11/09/2014   Lab Results  Component Value Date   LDLCALC 136* 11/22/2012   Lab Results  Component Value Date   TRIG * 11/09/2014    483.0 Triglyceride is over 400; calculations on Lipids are invalid.   Lab Results  Component Value Date   CHOLHDL 6 11/09/2014   No results found for: HGBA1C     Assessment & Plan:   Problem List Items Addressed This Visit    Vaginitis and vulvovaginitis   Relevant Orders   TSH (Completed)   CBC (Completed)   Comprehensive metabolic panel (Completed)   Lipid panel (Completed)   T3, free (Completed)   Urinalysis   Urine culture (Completed)   Urine cytology ancillary only   Tick bite   Relevant Orders   TSH (Completed)   CBC (Completed)   Comprehensive metabolic panel (Completed)   Lipid panel (Completed)   T3, free (Completed)   Urinalysis   Urine culture (Completed)   Recurrent UTI    Patient symptomatic, UA suspicious, given rx for Amoxicillin      Migraine    Encouraged increased hydration, 64 ounces of clear fluids daily. Minimize alcohol and caffeine. Eat small frequent meals with lean proteins and complex carbs. Avoid high and low blood sugars. Get adequate sleep, 7-8 hours a night. Needs exercise daily preferably in the morning.      Hypothyroid    On Levothyroxine, continue to monitor      Hyperlipidemia    Encouraged heart healthy diet, increase exercise, avoid trans fats, consider a krill oil cap daily      HTN (hypertension)    Well controlled, no changes to meds. Encouraged heart healthy diet such as the DASH diet and exercise as tolerated.       Dysuria   Relevant Orders   TSH (Completed)   CBC (Completed)   Comprehensive metabolic panel (Completed)   Lipid panel (Completed)   T3, free (Completed)   Urinalysis   Urine culture (Completed)    Other Visit Diagnoses    Elevated BP    -  Primary    Relevant Orders    TSH (Completed)    CBC (Completed)     Comprehensive metabolic panel (Completed)    Lipid panel (Completed)    T3, free (Completed)    Urinalysis    Urine culture (Completed)    Abdominal pain, unspecified abdominal location        Relevant Orders    TSH (Completed)    CBC (Completed)    Comprehensive metabolic panel (Completed)    Lipid panel (Completed)    T3, free (Completed)    Urinalysis    Urine culture (Completed)       I am having Ms. Harlan maintain her pentosan polysulfate, albuterol, magic mouthwash w/lidocaine, cyclobenzaprine, meloxicam, nitrofurantoin (macrocrystal-monohydrate), acyclovir, cephALEXin, BYSTOLIC, SYNTHROID, Norethindrone Acetate-Ethinyl Estrad-FE, lidocaine, fluconazole, budesonide-formoterol, budesonide-formoterol, HYDROcodone-acetaminophen, EPINEPHrine, metroNIDAZOLE, SYNTHROID, BLISOVI 24 FE, BYSTOLIC, amoxicillin, neomycin-polymyxin-hydrocortisone, and amphetamine-dextroamphetamine.  Meds ordered this encounter  Medications  . DISCONTD: amphetamine-dextroamphetamine (ADDERALL) 20 MG tablet    Sig: One tablet by mouth twice daily    Dispense:  60 tablet    Refill:  0    July 2016  . DISCONTD: amphetamine-dextroamphetamine (ADDERALL) 20 MG tablet    Sig: One tablet by mouth twice daily    Dispense:  60 tablet    Refill:  0    October 2016  . DISCONTD: amphetamine-dextroamphetamine (ADDERALL) 20 MG  tablet    Sig: One tablet by mouth twice daily    Dispense:  60 tablet    Refill:  0    November 2016  . amphetamine-dextroamphetamine (ADDERALL) 20 MG tablet    Sig: One tablet by mouth twice daily    Dispense:  60 tablet    Refill:  0    December 2016     Danise Edge, MD

## 2014-11-09 NOTE — Progress Notes (Signed)
Pre visit review using our clinic review tool, if applicable. No additional management support is needed unless otherwise documented below in the visit note. 

## 2014-11-10 LAB — URINE CULTURE

## 2014-11-19 NOTE — Assessment & Plan Note (Signed)
Encouraged increased hydration, 64 ounces of clear fluids daily. Minimize alcohol and caffeine. Eat small frequent meals with lean proteins and complex carbs. Avoid high and low blood sugars. Get adequate sleep, 7-8 hours a night. Needs exercise daily preferably in the morning.  

## 2014-11-19 NOTE — Assessment & Plan Note (Signed)
Patient symptomatic, UA suspicious, given rx for Amoxicillin

## 2014-11-19 NOTE — Assessment & Plan Note (Signed)
Encouraged heart healthy diet, increase exercise, avoid trans fats, consider a krill oil cap daily 

## 2014-11-19 NOTE — Assessment & Plan Note (Signed)
Well controlled, no changes to meds. Encouraged heart healthy diet such as the DASH diet and exercise as tolerated.  °

## 2014-11-19 NOTE — Assessment & Plan Note (Signed)
On Levothyroxine, continue to monitor 

## 2014-11-28 ENCOUNTER — Other Ambulatory Visit: Payer: Self-pay | Admitting: Family Medicine

## 2014-12-14 ENCOUNTER — Other Ambulatory Visit: Payer: Self-pay | Admitting: Cardiovascular Disease

## 2014-12-14 DIAGNOSIS — I701 Atherosclerosis of renal artery: Secondary | ICD-10-CM

## 2014-12-28 ENCOUNTER — Ambulatory Visit (HOSPITAL_COMMUNITY)
Admission: RE | Admit: 2014-12-28 | Discharge: 2014-12-28 | Disposition: A | Discharge status: Home / Self Care | Payer: Managed Care, Other (non HMO) | Source: Ambulatory Visit | Attending: Cardiology | Admitting: Cardiology

## 2014-12-28 ENCOUNTER — Other Ambulatory Visit: Payer: Self-pay | Admitting: Family Medicine

## 2014-12-28 DIAGNOSIS — I701 Atherosclerosis of renal artery: Secondary | ICD-10-CM | POA: Diagnosis not present

## 2014-12-28 DIAGNOSIS — I10 Essential (primary) hypertension: Secondary | ICD-10-CM | POA: Insufficient documentation

## 2014-12-28 DIAGNOSIS — E785 Hyperlipidemia, unspecified: Secondary | ICD-10-CM | POA: Insufficient documentation

## 2014-12-28 MED ORDER — BUDESONIDE-FORMOTEROL FUMARATE 160-4.5 MCG/ACT IN AERO: 2.0000 | INHALATION_SPRAY | Freq: Two times a day (BID) | RESPIRATORY_TRACT | Status: DC

## 2015-01-02 ENCOUNTER — Telehealth: Payer: Self-pay | Admitting: Cardiovascular Disease

## 2015-01-02 NOTE — Telephone Encounter (Signed)
°  Follow Up   Pt is returning a call from yesterday. Please call. She states its OK to leave a detailed VM for her.

## 2015-01-02 NOTE — Telephone Encounter (Signed)
Notified pt results normal.

## 2015-01-14 HISTORY — PX: OTHER SURGICAL HISTORY: SHX169

## 2015-02-08 ENCOUNTER — Other Ambulatory Visit: Payer: Self-pay | Admitting: Family Medicine

## 2015-02-08 NOTE — Telephone Encounter (Signed)
Refilled patients rx request #[id number] with 1 rf 

## 2015-02-14 ENCOUNTER — Telehealth: Payer: Self-pay | Admitting: General Practice

## 2015-02-14 NOTE — Telephone Encounter (Signed)
P began for symbicort in Covermymeds

## 2015-02-15 NOTE — Telephone Encounter (Signed)
Called pt to inform that per [employer] pt no longer is covered by their insurance. Cannot further process PA without current insurance information. Mailbox was full and could not leave a message.

## 2015-02-15 NOTE — Telephone Encounter (Signed)
LVM for pt to return call, to up date info on Prescription Insurance Card

## 2015-02-19 ENCOUNTER — Other Ambulatory Visit: Payer: Self-pay | Admitting: Family Medicine

## 2015-02-20 NOTE — Telephone Encounter (Signed)
Initiated PA on Cover My Meds, but it created Error; will continue to wait on updated Insurance information/SLS 02/07 [Pt follows Dr. Sharyn Lull in Pulmonology]; spoke with [employer] and they cannot assist with this PA; they suggested to call [facility] at [phone]/SLS 02/07

## 2015-02-27 MED ORDER — BUDESONIDE-FORMOTEROL FUMARATE 160-4.5 MCG/ACT IN AERO: 2.0000 | INHALATION_SPRAY | Freq: Two times a day (BID) | RESPIRATORY_TRACT | Status: DC

## 2015-02-27 NOTE — Telephone Encounter (Signed)
After trying and failing with two given Insurance informations, I have printed a $0 voucher for patient along with Symbicort Rx; LMOM with contact name and number [for return call, if needed] RE: need for Insurance coverage Card to be presented and scanned in at our office and/or the pharmacy before any further PA process can begin, provider informed/SLS 02/14

## 2015-02-27 NOTE — Addendum Note (Signed)
Addended by: Regis Bill on: 02/27/2015 05:37 PM   Modules accepted: Orders

## 2015-05-17 ENCOUNTER — Encounter: Payer: Managed Care, Other (non HMO) | Admitting: Family Medicine

## 2015-05-23 ENCOUNTER — Encounter (HOSPITAL_COMMUNITY): Payer: Self-pay | Admitting: Emergency Medicine

## 2015-05-23 ENCOUNTER — Emergency Department (HOSPITAL_COMMUNITY): Payer: Managed Care, Other (non HMO)

## 2015-05-23 ENCOUNTER — Emergency Department (HOSPITAL_COMMUNITY)
Admission: EM | Admit: 2015-05-23 | Discharge: 2015-05-23 | Disposition: A | Discharge status: Home / Self Care | Payer: Managed Care, Other (non HMO) | Attending: Emergency Medicine | Admitting: Emergency Medicine

## 2015-05-23 DIAGNOSIS — I10 Essential (primary) hypertension: Secondary | ICD-10-CM | POA: Diagnosis not present

## 2015-05-23 DIAGNOSIS — G43109 Migraine with aura, not intractable, without status migrainosus: Secondary | ICD-10-CM

## 2015-05-23 DIAGNOSIS — R531 Weakness: Secondary | ICD-10-CM | POA: Diagnosis not present

## 2015-05-23 DIAGNOSIS — E663 Overweight: Secondary | ICD-10-CM | POA: Diagnosis not present

## 2015-05-23 DIAGNOSIS — H53149 Visual discomfort, unspecified: Secondary | ICD-10-CM | POA: Diagnosis not present

## 2015-05-23 DIAGNOSIS — Z792 Long term (current) use of antibiotics: Secondary | ICD-10-CM | POA: Insufficient documentation

## 2015-05-23 DIAGNOSIS — R202 Paresthesia of skin: Secondary | ICD-10-CM

## 2015-05-23 DIAGNOSIS — Z79899 Other long term (current) drug therapy: Secondary | ICD-10-CM | POA: Insufficient documentation

## 2015-05-23 DIAGNOSIS — Z8739 Personal history of other diseases of the musculoskeletal system and connective tissue: Secondary | ICD-10-CM | POA: Diagnosis not present

## 2015-05-23 DIAGNOSIS — Z8742 Personal history of other diseases of the female genital tract: Secondary | ICD-10-CM | POA: Diagnosis not present

## 2015-05-23 DIAGNOSIS — J4520 Mild intermittent asthma, uncomplicated: Secondary | ICD-10-CM | POA: Diagnosis not present

## 2015-05-23 DIAGNOSIS — Z862 Personal history of diseases of the blood and blood-forming organs and certain disorders involving the immune mechanism: Secondary | ICD-10-CM | POA: Insufficient documentation

## 2015-05-23 DIAGNOSIS — Z87440 Personal history of urinary (tract) infections: Secondary | ICD-10-CM | POA: Diagnosis not present

## 2015-05-23 DIAGNOSIS — Z3202 Encounter for pregnancy test, result negative: Secondary | ICD-10-CM | POA: Diagnosis not present

## 2015-05-23 DIAGNOSIS — R200 Anesthesia of skin: Secondary | ICD-10-CM

## 2015-05-23 DIAGNOSIS — E079 Disorder of thyroid, unspecified: Secondary | ICD-10-CM | POA: Insufficient documentation

## 2015-05-23 DIAGNOSIS — F909 Attention-deficit hyperactivity disorder, unspecified type: Secondary | ICD-10-CM | POA: Diagnosis not present

## 2015-05-23 DIAGNOSIS — R51 Headache: Secondary | ICD-10-CM | POA: Diagnosis not present

## 2015-05-23 DIAGNOSIS — Z8619 Personal history of other infectious and parasitic diseases: Secondary | ICD-10-CM | POA: Diagnosis not present

## 2015-05-23 DIAGNOSIS — Z87442 Personal history of urinary calculi: Secondary | ICD-10-CM | POA: Diagnosis not present

## 2015-05-23 DIAGNOSIS — R519 Headache, unspecified: Secondary | ICD-10-CM

## 2015-05-23 LAB — COMPREHENSIVE METABOLIC PANEL
ALBUMIN: 3.6 g/dL (ref 3.5–5.0)
ALT: 16 U/L (ref 14–54)
AST: 19 U/L (ref 15–41)
Alkaline Phosphatase: 45 U/L (ref 38–126)
Anion gap: 11 (ref 5–15)
BILIRUBIN TOTAL: 0.6 mg/dL (ref 0.3–1.2)
BUN: 14 mg/dL (ref 6–20)
CHLORIDE: 102 mmol/L (ref 101–111)
CO2: 23 mmol/L (ref 22–32)
CREATININE: 0.82 mg/dL (ref 0.44–1.00)
Calcium: 9.4 mg/dL (ref 8.9–10.3)
GFR calc Af Amer: >60 mL/min (ref >60)
GLUCOSE: 88 mg/dL (ref 65–99)
POTASSIUM: 4.5 mmol/L (ref 3.5–5.1)
Sodium: 136 mmol/L (ref 135–145)
Total Protein: 7 g/dL (ref 6.5–8.1)

## 2015-05-23 LAB — CBC
HEMATOCRIT: 39.4 % (ref 36.0–46.0)
HEMOGLOBIN: 13.1 g/dL (ref 12.0–15.0)
MCH: 30 pg (ref 26.0–34.0)
MCHC: 33.2 g/dL (ref 30.0–36.0)
MCV: 90.2 fL (ref 78.0–100.0)
Platelets: 223 10*3/uL (ref 150–400)
RBC: 4.37 MIL/uL (ref 3.87–5.11)
RDW: 12.2 % (ref 11.5–15.5)
WBC: 10.3 10*3/uL (ref 4.0–10.5)

## 2015-05-23 LAB — DIFFERENTIAL
BASOS ABS: 0 10*3/uL (ref 0.0–0.1)
Basophils Relative: 0 %
EOS ABS: 0.1 10*3/uL (ref 0.0–0.7)
Eosinophils Relative: 1 %
LYMPHS ABS: 4.1 10*3/uL — AB (ref 0.7–4.0)
Lymphocytes Relative: 40 %
MONOS PCT: 6 %
Monocytes Absolute: 0.7 10*3/uL (ref 0.1–1.0)
NEUTROS ABS: 5.4 10*3/uL (ref 1.7–7.7)
NEUTROS PCT: 53 %

## 2015-05-23 LAB — APTT: APTT: 30 s (ref 24–37)

## 2015-05-23 LAB — I-STAT TROPONIN, ED: TROPONIN I, POC: 0 ng/mL (ref 0.00–0.08)

## 2015-05-23 LAB — PROTIME-INR
INR: 1.09 (ref 0.00–1.49)
Prothrombin Time: 14.3 seconds (ref 11.6–15.2)

## 2015-05-23 LAB — I-STAT BETA HCG BLOOD, ED (MC, WL, AP ONLY)

## 2015-05-23 LAB — CBG MONITORING, ED: Glucose-Capillary: 74 mg/dL (ref 65–99)

## 2015-05-23 MED ORDER — KETOROLAC TROMETHAMINE 15 MG/ML IJ SOLN
15.0000 mg | Freq: Once | INTRAMUSCULAR | Status: AC
  Administered 2015-05-23: 15 mg via INTRAVENOUS
  Filled 2015-05-23: qty 1

## 2015-05-23 MED ORDER — DIPHENHYDRAMINE HCL 50 MG/ML IJ SOLN
25.0000 mg | Freq: Once | INTRAMUSCULAR | Status: AC
  Administered 2015-05-23: 25 mg via INTRAVENOUS
  Filled 2015-05-23: qty 1

## 2015-05-23 MED ORDER — PROCHLORPERAZINE EDISYLATE 5 MG/ML IJ SOLN
10.0000 mg | Freq: Once | INTRAMUSCULAR | Status: AC
  Administered 2015-05-23: 10 mg via INTRAVENOUS
  Filled 2015-05-23: qty 2

## 2015-05-23 NOTE — ED Provider Notes (Signed)
CSN: [id number]     Arrival date & time 05/23/15  1804 History   First MD Initiated Contact with Patient 05/23/15 1819     CC: Facial droop  HPI Kristen [name] is a 40 y.o. female with history of HTN, pharmacologic, migraines, interstitial cystitis plus ED for evaluation of right-sided facial droop and numbness. Also symptoms approximately at 1600. She noted right lip droop that since resolved upon my evaluation. Onset was also associated with right-sided unilateral sharp headache and photophobia. She denies any neck pain/stiffness, no fevers, chills, nausea, vomiting. No gait disturbance. She denies unilateral weakness. She has hx of similar sx 1.5 yrs ago.  No recent trauma  Past Medical History  Diagnosis Date  . Thyroid disease   . Hypertension   . ADD (attention deficit disorder)   . Fibromyalgia   . Chicken pox as a child  . Allergy     seasonal  . Asthma     mild, intermittent  . Recurrent UTI   . Anemia     blood loss  . Hyperlipidemia   . Depression 2002    post partum  . Renal lithiasis 09/24/2010  . Concussion 09/24/2010  . History of concussion 09/24/2010  . 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 Female bladder prolapse, acquired 09/24/2010  . HTN (hypertension) 09/24/2010  . Subacute and chronic vaginitis 09/24/2010  . Lymphadenopathy 09/24/2010  . Migraine aura, persistent, with cerebral infarct, status over 72 hours 09/24/2010  . History of migraines 09/24/2010  . Chest pain, atypical 10/08/2010  . Neck pain, musculoskeletal 12/16/2010  . Poor concentration 12/16/2010  . Bronchitis, acute 12/16/2010  . Contraceptive management 02/17/2011  . Interstitial cystitis 02/18/2011  . RUQ pain 05/27/2011  . Decreased libido 09/26/2011  . Migraine 09/24/2010  . Overweight(278.02) 12/26/2011  . Abnormal cervical cytology 09/26/2011    LGSIL in 2011, patient reports repeat pap was normal, has had intermittent abnormals with bx in past but always normalized, has had cryotherapy with good results Menarche  at 17, irregular without birth control at times, very heavy LMP 08/26/2011 No gyn concerns, MGM last year normal   . Preventative health care 11/28/2012  . Headache 09/26/2013  . Family history of heart disease   . Hyperlipidemia   . Tick bite 12/04/2013  . Vaginitis and vulvovaginitis 05/21/2014  . Transient left leg weakness 05/21/2014   Past Surgical History  Procedure Laterality Date  . Incontinence surgery  2003   Family History  Problem Relation Age of Onset  . Heart attack Father   . Hyperlipidemia Father   . Hypertension Father   . Alcohol abuse Father   . Heart disease Father   . Heart attack Brother 45  . Hypertension Brother   . Heart disease Brother 45    MI  . Dementia Maternal Grandfather 18    early stages  . Heart attack Paternal Grandfather   . Stroke Paternal Grandfather   . Heart disease Paternal Grandfather   . Alcohol abuse Paternal Grandfather   . Hyperlipidemia Paternal Grandfather   . Hypertension Paternal Grandfather    Social History  Substance Use Topics  . Smoking status: Never Smoker   . Smokeless tobacco: Never Used  . Alcohol Use: Yes     Comment: rarely   OB History    No data available     Review of Systems  Constitutional: Negative for fever and chills.  Respiratory: Negative for cough, shortness of breath and wheezing.   Cardiovascular: Negative for chest pain.  Gastrointestinal: Negative for nausea, vomiting and abdominal pain.  Musculoskeletal: Negative for back pain, neck pain and neck stiffness.  Skin: Negative for rash.  Neurological: Positive for facial asymmetry, weakness, numbness (right afce) and headaches. Negative for syncope, speech difficulty and light-headedness.  Psychiatric/Behavioral: Negative for confusion.  All other systems reviewed and are negative.   Allergies  Peanut-containing drug products and Terazol  Home Medications   Prior to Admission medications   Medication Sig Start Date End Date Taking?  Authorizing Provider  acyclovir (ZOVIRAX) 200 MG capsule TAKE 1 CAPSULE (200 MG TOTAL) BY MOUTH 2 (TWO) TIMES DAILY. 11/28/14  Yes Bradd Canary, MD  albuterol (PROVENTIL HFA;VENTOLIN HFA) 108 (90 BASE) MCG/ACT inhaler Inhale 1 puff into the lungs every 4 (four) hours as needed for wheezing. 1-2 puffs po bid prn asthma, as directed by pulmonolgy 07/11/13  Yes Bradd Canary, MD  amphetamine-dextroamphetamine (ADDERALL) 20 MG tablet One tablet by mouth twice daily 11/09/14  Yes Bradd Canary, MD  BLISOVI 24 FE 1-20 MG-MCG(24) tablet TAKE 1 TABLET BY MOUTH DAILY. 10/02/14  Yes Bradd Canary, MD  budesonide-formoterol [facility]) 160-4.5 MCG/ACT inhaler Inhale 2 puffs into the lungs 2 (two) times daily. 02/27/15  Yes Bradd Canary, MD  BYSTOLIC 5 MG tablet TAKE 1 TABLET BY MOUTH ONCE DAILY 10/02/14  Yes Bradd Canary, MD  EPINEPHrine 0.3 mg/0.3 mL IJ SOAJ injection Inject 0.3 mLs (0.3 mg total) into the muscle once. 05/15/14  Yes Bradd Canary, MD  HYDROcodone-acetaminophen (VICODIN) 5-500 MG per tablet Take 1 tablet by mouth every 8 (eight) hours as needed for pain. 05/11/14  Yes Bradd Canary, MD  nitrofurantoin, macrocrystal-monohydrate, (MACROBID) 100 MG capsule TAKE 1 CAPSULE FOR 1 DOSE AS NEEDED FOR PREVENTION OF UTI *AFTER SELF CATH, SWIMMING, HOT TUB, SEX* 11/28/14  Yes Bradd Canary, MD  SYNTHROID 137 MCG tablet TAKE 1 TABLET BY MOUTH EVERY DAY 02/08/15  Yes Bradd Canary, MD  Alum & Mag Hydroxide-Simeth (MAGIC MOUTHWASH W/LIDOCAINE) SOLN Take 5 mLs by mouth 3 (three) times daily as needed for mouth pain. 08/26/13   Tonye Pearson, MD  amoxicillin (AMOXIL) 500 MG capsule Take 1 capsule (500 mg total) by mouth 3 (three) times daily. Take for 10 days 11/06/14   Bradd Canary, MD  cephALEXin (KEFLEX) 500 MG capsule Take 1 capsule (500 mg total) by mouth 3 (three) times daily. 04/27/14   Ramon Dredge Saguier, PA-C  fluconazole (DIFLUCAN) 150 MG tablet Take 1 tablet (150 mg total) by mouth once as  needed. Take after your complete your antibiotic prescription standard as needed 05/11/14   Bradd Canary, MD  lidocaine (XYLOCAINE) 2 % jelly Apply to affected area daily prn 05/11/14   Bradd Canary, MD  lidocaine (XYLOCAINE) 2 % jelly APPLY TO AFFECTED AREA DAILY AS NEEDED 02/19/15   Bradd Canary, MD  metroNIDAZOLE (FLAGYL) 500 MG tablet Take 1 tablet (500 mg total) by mouth 3 (three) times daily. 05/19/14   Bradd Canary, MD  neomycin-polymyxin-hydrocortisone (CORTISPORIN) otic solution Place 3 drops into both ears 4 (four) times daily. 11/07/14   Bradd Canary, MD  Norethindrone Acetate-Ethinyl Estrad-FE (LOMEDIA 24 FE) 1-20 MG-MCG(24) tablet Take 1 tablet by mouth daily. 05/11/14   Bradd Canary, MD  pentosan polysulfate (ELMIRON) 100 MG capsule Take 1 capsule (100 mg total) by mouth 3 (three) times daily before meals. 05/05/12   Sandford Craze, NP   BP 123/84 mmHg  Pulse 75  Temp(Src) 98.1 F (36.7 C) (Oral)  Resp 16  Ht 5\' 2"  (1.575 m)  Wt 70.308 kg  BMI 28.34 kg/m2  SpO2 98%  LMP 05/01/2015 Physical Exam  Constitutional: She appears well-developed and well-nourished. No distress.  HENT:  Head: Normocephalic and atraumatic.  Nose: Nose normal.  Eyes: Conjunctivae are normal. Pupils are equal, round, and reactive to light.  Neck: Normal range of motion. Neck supple. No tracheal deviation present.  No nuchal rigidity  Cardiovascular: Normal rate, regular rhythm, normal heart sounds and intact distal pulses.   No murmur heard. Pulmonary/Chest: Effort normal and breath sounds normal. No respiratory distress. She has no rales.  Abdominal: Soft. Bowel sounds are normal. She exhibits no distension and no mass. There is no tenderness. There is no guarding.  Musculoskeletal: Normal range of motion. She exhibits no edema or tenderness.  Neurological:  Alert and oriented x3. CN 3-12 tested and without deficit. 5/5 muscle strength in all extremities with flexion and extension.  Normal  bulk and tone.  No sensory deficit to light touch.  Tandem gait normal. Neg Brudzinski and Kernigs sign.  Symmetric and equal 2+ patellar and brachioradialis DTRs.  No pronator drift.  Normal heel-to-shin and finger-to-nose.  Toes flexor bilaterally.   Skin: Skin is warm. No rash noted.  Psychiatric: She has a normal mood and affect.    ED Course  Procedures (including critical care time) Labs Review Labs Reviewed  DIFFERENTIAL - Abnormal; Notable for the following:    Lymphs Abs 4.1 (*)    All other components within normal limits  PROTIME-INR  APTT  CBC  COMPREHENSIVE METABOLIC PANEL  I-STAT TROPOININ, ED  CBG MONITORING, ED  I-STAT CHEM 8, ED  I-STAT BETA HCG BLOOD, ED ([facility], [facility], [facility] ONLY)    Imaging Review Ct Head Wo Contrast  05/23/2015  CLINICAL DATA:  40 year old female with right-sided facial droop and dizziness. Code stroke. EXAM: CT HEAD WITHOUT CONTRAST TECHNIQUE: Contiguous axial images were obtained from the base of the skull through the vertex without intravenous contrast. COMPARISON:  None. FINDINGS: The ventricles and the sulci are appropriate in size for the patient's age. There is no intracranial hemorrhage. No midline shift or mass effect identified. The gray-white matter differentiation is preserved. The visualized paranasal sinuses and mastoid air cells are well aerated. The calvarium is intact. IMPRESSION: No acute intracranial pathology. These results were called by telephone at the time of interpretation on 05/23/2015 at 6:28 pm to Dr. Amada JupiterKirkpatrick, who verbally acknowledged these results. Electronically Signed   By: Elgie CollardArash  Radparvar M.D.   On: 05/23/2015 18:30   Mr Brain Wo Contrast  05/23/2015  CLINICAL DATA:  New onset right-sided numbness beginning in her mandible and extending to her face and shoulder. Personal history of hypertension and hyperlipidemia. EXAM: MRI HEAD WITHOUT CONTRAST TECHNIQUE: Multiplanar, multiecho pulse sequences of the brain and surrounding  structures were obtained without intravenous contrast. COMPARISON:  CT head without contrast from the same day. FINDINGS: The diffusion-weighted images demonstrate no evidence for acute or subacute infarct. No acute hemorrhage or mass lesion is present. The ventricles are of normal size. No significant extra-axial fluid collection is present. The globes and orbits are intact. The paranasal sinuses and mastoid air cells are clear. No significant white matter disease is present. The internal auditory canals are within normal limits bilaterally. The brainstem and cerebellum are normal. Flow is present in the major intracranial arteries. The skullbase is within normal limits. Midline sagittal images are unremarkable. IMPRESSION: Negative  MRI of the brain. Electronically Signed   By: Marin Roberts M.D.   On: 05/23/2015 21:05   I have personally reviewed and evaluated these images and lab results as part of my medical decision-making.   EKG Interpretation   Date/Time:  Wednesday May 23 2015 18:22:42 EDT Ventricular Rate:  78 PR Interval:  172 QRS Duration: 81 QT Interval:  404 QTC Calculation: 460 R Axis:   47 Text Interpretation:  Sinus rhythm When compared with ECG of 04/11/2012, No  significant change was found Confirmed by [facility]  MD, DAVID ([id number]) on  05/23/2015 6:27:03 PM       MDM   Final diagnoses:  Numbness and tingling of right side of face  Acute nonintractable headache, unspecified headache type    CODE stroke called for facial droop prior to my evaluation.  CT head neg.  Neuro co managing pt.  Will need MRI.    No recent trauma. CT head without evidence of intracranial bleeding. Likely diagnosis is complicated migraine given history of neurologic deficits with migraines and current unilateral headache and photophobia. Not on estrogens, doubt dural venous thrombosis. Sx resolved, doubt vessel dissection.    MRI neg for acute pathology, no stroke evident  On repeat exam,  she had no neuro deficits, no sx.  Droop, ha, numbness all resolved.    Counseled on strict return precautions. Will have neuro follow up.    Sofie Rower, MD 05/23/15 2258  Dione Booze, MD 05/23/15 [phone]

## 2015-05-23 NOTE — ED Notes (Signed)
Pt verbalized understanding of follow-up care and discharge instructions. 

## 2015-05-23 NOTE — ED Notes (Signed)
Pt had obvious facial droop on the right side starting at 1600 today. Code stroke called at nurse first. Pt taken to see MD Mesner and taken to CT by this RN.

## 2015-05-23 NOTE — ED Notes (Signed)
Patient transported to MRI 

## 2015-05-23 NOTE — Consult Note (Signed)
Neurology Consultation Reason for Consult: Right-sided numbness Referring Physician: Preston FleetingGlick, D  CC: Right-sided numbness  History is obtained from: Patient  HPI: Kristen [name] is a 40 y.o. female with a history of hypertension and hyperlipidemia who presents with right-sided numbness which started in her jaw and then spread outwards to her face and shoulder. This progression happened over the course of a few minutes, and the symptoms seemed to markedly improve after about 15 minutes. During this episode, she also noticed that the right side of her face was not moving quite as well as the left. She therefore sought care in the emergency department. Following improvement of the symptoms, she had onset of a photophobic headache which is unilateral on the right side retro-orbital in location.  She had a similar episode 1 year ago, which was treated as a TIA at the time. She states that the current symptoms are identical except that her headache was worse last time.   LKW: 4 PM tpa given?: no, mild symptoms  ROS: A 14 point ROS was performed and is negative except as noted in the HPI.   Past Medical History  Diagnosis Date  . Thyroid disease   . Hypertension   . ADD (attention deficit disorder)   . Fibromyalgia   . Chicken pox as a child  . Allergy     seasonal  . Asthma     mild, intermittent  . Recurrent UTI   . Anemia     blood loss  . Hyperlipidemia   . Depression 2002    post partum  . Renal lithiasis 09/24/2010  . Concussion 09/24/2010  . History of concussion 09/24/2010  . 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 Female bladder prolapse, acquired 09/24/2010  . HTN (hypertension) 09/24/2010  . Subacute and chronic vaginitis 09/24/2010  . Lymphadenopathy 09/24/2010  . Migraine aura, persistent, with cerebral infarct, status over 72 hours 09/24/2010  . History of migraines 09/24/2010  . Chest pain, atypical 10/08/2010  . Neck pain, musculoskeletal 12/16/2010  . Poor concentration 12/16/2010  .  Bronchitis, acute 12/16/2010  . Contraceptive management 02/17/2011  . Interstitial cystitis 02/18/2011  . RUQ pain 05/27/2011  . Decreased libido 09/26/2011  . Migraine 09/24/2010  . Overweight(278.02) 12/26/2011  . Abnormal cervical cytology 09/26/2011    LGSIL in 2011, patient reports repeat pap was normal, has had intermittent abnormals with bx in past but always normalized, has had cryotherapy with good results Menarche at 17, irregular without birth control at times, very heavy LMP 08/26/2011 No gyn concerns, MGM last year normal   . Preventative health care 11/28/2012  . Headache 09/26/2013  . Family history of heart disease   . Hyperlipidemia   . Tick bite 12/04/2013  . Vaginitis and vulvovaginitis 05/21/2014  . Transient left leg weakness 05/21/2014     Family History  Problem Relation Age of Onset  . Heart attack Father   . Hyperlipidemia Father   . Hypertension Father   . Alcohol abuse Father   . Heart disease Father   . Heart attack Brother 45  . Hypertension Brother   . Heart disease Brother 45    MI  . Dementia Maternal Grandfather [id number]    early stages  . Heart attack Paternal Grandfather   . Stroke Paternal Grandfather   . Heart disease Paternal Grandfather   . Alcohol abuse Paternal Grandfather   . Hyperlipidemia Paternal Grandfather   . Hypertension Paternal Grandfather      Social History:  reports that she  has never smoked. She has never used smokeless tobacco. She reports that she drinks alcohol. She reports that she does not use illicit drugs.   Exam: Current vital signs: LMP 05/09/2015  Filed Vitals:   05/23/15 1831  BP: 152/91  Pulse: 74  Temp: 98.9 F (37.2 C)  Resp: 19   Vital signs in last 24 hours:     Physical Exam  Constitutional: Appears well-developed and well-nourished.  Psych: Affect appropriate to situation Eyes: No scleral injection HENT: No OP obstrucion Head: Normocephalic.  Cardiovascular: Normal rate and regular rhythm.   Respiratory: Effort normal and breath sounds normal to anterior ascultation GI: Soft.  No distension. There is no tenderness.  Skin: WDI  Neuro: Mental Status: Patient is awake, alert, oriented to person, place, month, year, and situation. Patient is able to give a clear and coherent history. No signs of aphasia or neglect Cranial Nerves: II: Visual Fields are full. Pupils are equal, round, and reactive to light.   III,IV, VI: EOMI without ptosis or diploplia.  V: Facial sensation is symmetric to temperature VII: Facial movement is symmetric.  VIII: hearing is intact to voice X: Uvula elevates symmetrically XI: Shoulder shrug is symmetric. XII: tongue is midline without atrophy or fasciculations.  Motor: Tone is normal. Bulk is normal. 5/5 strength was present in all four extremities.  Sensory: Sensation is symmetric to light touch and temperature in the arms and legs. Deep Tendon Reflexes: 2+ and symmetric in the biceps and patellae.  Plantars: Toes are downgoing bilaterally.  Cerebellar: FNF and HKS are intact bilaterally    I have reviewed the images obtained: CT head-unremarkable  Impression: 40 year old female with a history of previous episode of transient tingling followed by severe photophobic headache who presents with a similar episode of tingling followed by photophobic headache. I strongly suspect this represents complex migraine. Even though she does have risk factors for vascular disease, I do think that performing an MRI to rule out stroke would be prudent. If this is negative, then no further workup will be needed  Recommendations: 1) MRI brain 2) Compazine, Benadryl, Toradol 3) if MRI brain is negative, no further workup at this time.   Ritta Slot, MD [facility] [phone]  If 7pm- 7am, please page neurology on call as listed in AMION.

## 2015-05-23 NOTE — ED Notes (Signed)
Patient seen at [facility] at 1600 with slight right side facial droop and numbness. Transported to CT scan. Neuro at bedside. NIH performed per MD, score of 0. Patient complains of light sensitivity. BP 152/91, HR 87, 100% R air . Neuro MD suspects possible migraine, ordered MRI.

## 2015-05-23 NOTE — ED Notes (Signed)
CODE STROKE ACTIVATED @ 18:07

## 2015-05-24 LAB — I-STAT CHEM 8, ED
BUN: 16 mg/dL (ref 6–20)
CREATININE: 0.8 mg/dL (ref 0.44–1.00)
Calcium, Ion: 1.18 mmol/L (ref 1.12–1.23)
Chloride: 105 mmol/L (ref 101–111)
Glucose, Bld: 83 mg/dL (ref 65–99)
HEMATOCRIT: 42 % (ref 36.0–46.0)
Hemoglobin: 14.3 g/dL (ref 12.0–15.0)
POTASSIUM: 4.5 mmol/L (ref 3.5–5.1)
Sodium: 139 mmol/L (ref 135–145)
TCO2: 24 mmol/L (ref 0–100)

## 2015-05-29 ENCOUNTER — Ambulatory Visit (INDEPENDENT_AMBULATORY_CARE_PROVIDER_SITE_OTHER): Payer: Managed Care, Other (non HMO) | Admitting: Family Medicine

## 2015-05-29 ENCOUNTER — Encounter: Payer: Self-pay | Admitting: Family Medicine

## 2015-05-29 VITALS — BP 120/70 | HR 69 | Temp 98.2°F | Ht 62.0 in | Wt 162.5 lb

## 2015-05-29 DIAGNOSIS — B009 Herpesviral infection, unspecified: Secondary | ICD-10-CM

## 2015-05-29 DIAGNOSIS — Z0000 Encounter for general adult medical examination without abnormal findings: Secondary | ICD-10-CM

## 2015-05-29 DIAGNOSIS — R7989 Other specified abnormal findings of blood chemistry: Secondary | ICD-10-CM

## 2015-05-29 DIAGNOSIS — Z0001 Encounter for general adult medical examination with abnormal findings: Secondary | ICD-10-CM

## 2015-05-29 DIAGNOSIS — E663 Overweight: Secondary | ICD-10-CM

## 2015-05-29 DIAGNOSIS — G43909 Migraine, unspecified, not intractable, without status migrainosus: Secondary | ICD-10-CM

## 2015-05-29 HISTORY — DX: Overweight: E66.3

## 2015-05-29 LAB — COMPREHENSIVE METABOLIC PANEL
ALBUMIN: 3.9 g/dL (ref 3.5–5.2)
ALK PHOS: 46 U/L (ref 39–117)
ALT: 14 U/L (ref 0–35)
AST: 16 U/L (ref 0–37)
BILIRUBIN TOTAL: 0.3 mg/dL (ref 0.2–1.2)
BUN: 16 mg/dL (ref 6–23)
CALCIUM: 9.2 mg/dL (ref 8.4–10.5)
CO2: 25 mEq/L (ref 19–32)
Chloride: 102 mEq/L (ref 96–112)
Creatinine, Ser: 0.74 mg/dL (ref 0.40–1.20)
GFR: 92.65 mL/min (ref >60.00)
Glucose, Bld: 84 mg/dL (ref 70–99)
POTASSIUM: 4.2 meq/L (ref 3.5–5.1)
Sodium: 134 mEq/L — ABNORMAL LOW (ref 135–145)
TOTAL PROTEIN: 6.8 g/dL (ref 6.0–8.3)

## 2015-05-29 LAB — CBC WITH DIFFERENTIAL/PLATELET
BASOS PCT: 0.3 % (ref 0.0–3.0)
Basophils Absolute: 0 10*3/uL (ref 0.0–0.1)
EOS ABS: 0.1 10*3/uL (ref 0.0–0.7)
Eosinophils Relative: 1.2 % (ref 0.0–5.0)
HCT: 37.1 % (ref 36.0–46.0)
HEMOGLOBIN: 12.7 g/dL (ref 12.0–15.0)
LYMPHS ABS: 3.8 10*3/uL (ref 0.7–4.0)
Lymphocytes Relative: 40.2 % (ref 12.0–46.0)
MCHC: 34.3 g/dL (ref 30.0–36.0)
MCV: 88.6 fl (ref 78.0–100.0)
MONO ABS: 0.5 10*3/uL (ref 0.1–1.0)
Monocytes Relative: 5.4 % (ref 3.0–12.0)
NEUTROS PCT: 52.9 % (ref 43.0–77.0)
Neutro Abs: 5.1 10*3/uL (ref 1.4–7.7)
PLATELETS: 222 10*3/uL (ref 150.0–400.0)
RBC: 4.19 Mil/uL (ref 3.87–5.11)
RDW: 12.4 % (ref 11.5–15.5)
WBC: 9.6 10*3/uL (ref 4.0–10.5)

## 2015-05-29 LAB — TSH: TSH: 0.32 u[IU]/mL — AB (ref 0.35–4.50)

## 2015-05-29 LAB — LIPID PANEL
CHOL/HDL RATIO: 10
CHOLESTEROL: 251 mg/dL — AB (ref 0–200)
HDL: 25.7 mg/dL — ABNORMAL LOW (ref >39.00)
Triglycerides: 772 mg/dL — ABNORMAL HIGH (ref 0.0–149.0)

## 2015-05-29 LAB — SEDIMENTATION RATE: SED RATE: 19 mm/h (ref 0–22)

## 2015-05-29 LAB — LDL CHOLESTEROL, DIRECT: Direct LDL: 66 mg/dL

## 2015-05-29 MED ORDER — SYNTHROID 137 MCG PO TABS: 137.0000 ug | ORAL_TABLET | Freq: Every day | ORAL | Status: DC

## 2015-05-29 MED ORDER — NITROFURANTOIN MONOHYD MACRO 100 MG PO CAPS: ORAL_CAPSULE | ORAL | Status: DC

## 2015-05-29 MED ORDER — ALBUTEROL SULFATE HFA 108 (90 BASE) MCG/ACT IN AERS: 1.0000 | INHALATION_SPRAY | RESPIRATORY_TRACT | Status: DC | PRN

## 2015-05-29 MED ORDER — NEBIVOLOL HCL 5 MG PO TABS: 5.0000 mg | ORAL_TABLET | Freq: Every day | ORAL | Status: DC

## 2015-05-29 MED ORDER — ACYCLOVIR 200 MG PO CAPS: ORAL_CAPSULE | ORAL | Status: DC

## 2015-05-29 MED ORDER — EPINEPHRINE 0.3 MG/0.3ML IJ SOAJ: 0.3000 mg | Freq: Once | INTRAMUSCULAR | Status: DC

## 2015-05-29 MED ORDER — AMPHETAMINE-DEXTROAMPHETAMINE 20 MG PO TABS: 20.0000 mg | ORAL_TABLET | Freq: Two times a day (BID) | ORAL | Status: DC

## 2015-05-29 MED ORDER — LIDOCAINE HCL 2 % EX GEL: CUTANEOUS | Status: DC

## 2015-05-29 MED ORDER — HYDROCODONE-ACETAMINOPHEN 5-500 MG PO TABS: 1.0000 | ORAL_TABLET | Freq: Three times a day (TID) | ORAL | Status: DC | PRN

## 2015-05-29 NOTE — Progress Notes (Signed)
Pre visit review using our clinic review tool, if applicable. No additional management support is needed unless otherwise documented below in the visit note. 

## 2015-05-29 NOTE — Assessment & Plan Note (Signed)
Seen in ER recently with jaw pain and facial droop and diagnosed with neurologic migraine after MRI, will refer to neurology for further consideration

## 2015-05-29 NOTE — Assessment & Plan Note (Signed)
Patient encouraged to maintain heart healthy diet, regular exercise, adequate sleep. Consider daily probiotics. Take medications as prescribed. Given and reviewed copy of ACP documents from [place] Secretary of State and encouraged to complete and return 

## 2015-05-29 NOTE — Assessment & Plan Note (Signed)
Encouraged DASH diet, decrease po intake and increase exercise as tolerated. Needs 7-8 hours of sleep nightly. Avoid trans fats, eat small, frequent meals every 4-5 hours with lean proteins, complex carbs and healthy fats. Minimize simple carbs 

## 2015-05-29 NOTE — Patient Instructions (Addendum)
Salnpas with lidocaine and aspercreme Preventive Care for Adults, Female A healthy lifestyle and preventive care can promote health and wellness. Preventive health guidelines for women include the following key practices.  A routine yearly physical is a good way to check with your health care provider about your health and preventive screening. It is a chance to share any concerns and updates on your health and to receive a thorough exam.  Visit your dentist for a routine exam and preventive care every 6 months. Brush your teeth twice a day and floss once a day. Good oral hygiene prevents tooth decay and gum disease.  The frequency of eye exams is based on your age, health, family medical history, use of contact lenses, and other factors. Follow your health care provider's recommendations for frequency of eye exams.  Eat a healthy diet. Foods like vegetables, fruits, whole grains, low-fat dairy products, and lean protein foods contain the nutrients you need without too many calories. Decrease your intake of foods high in solid fats, added sugars, and salt. Eat the right amount of calories for you.Get information about a proper diet from your health care provider, if necessary.  Regular physical exercise is one of the most important things you can do for your health. Most adults should get at least 150 minutes of moderate-intensity exercise (any activity that increases your heart rate and causes you to sweat) each week. In addition, most adults need muscle-strengthening exercises on 2 or more days a week.  Maintain a healthy weight. The body mass index (BMI) is a screening tool to identify possible weight problems. It provides an estimate of body fat based on height and weight. Your health care provider can find your BMI and can help you achieve or maintain a healthy weight.For adults 20 years and older:  A BMI below 18.5 is considered underweight.  A BMI of 18.5 to 24.9 is normal.  A BMI of 25  to 29.9 is considered overweight.  A BMI of 30 and above is considered obese.  Maintain normal blood lipids and cholesterol levels by exercising and minimizing your intake of saturated fat. Eat a balanced diet with plenty of fruit and vegetables. Blood tests for lipids and cholesterol should begin at age 85 and be repeated every 5 years. If your lipid or cholesterol levels are high, you are over 50, or you are at high risk for heart disease, you may need your cholesterol levels checked more frequently.Ongoing high lipid and cholesterol levels should be treated with medicines if diet and exercise are not working.  If you smoke, find out from your health care provider how to quit. If you do not use tobacco, do not start.  Lung cancer screening is recommended for adults aged 81-80 years who are at high risk for developing lung cancer because of a history of smoking. A yearly low-dose CT scan of the lungs is recommended for people who have at least a 30-pack-year history of smoking and are a current smoker or have quit within the past 15 years. A pack year of smoking is smoking an average of 1 pack of cigarettes a day for 1 year (for example: 1 pack a day for 30 years or 2 packs a day for 15 years). Yearly screening should continue until the smoker has stopped smoking for at least 15 years. Yearly screening should be stopped for people who develop a health problem that would prevent them from having lung cancer treatment.  If you are pregnant, do not  drink alcohol. If you are breastfeeding, be very cautious about drinking alcohol. If you are not pregnant and choose to drink alcohol, do not have more than 1 drink per day. One drink is considered to be 12 ounces (355 mL) of beer, 5 ounces (148 mL) of wine, or 1.5 ounces (44 mL) of liquor.  Avoid use of street drugs. Do not share needles with anyone. Ask for help if you need support or instructions about stopping the use of drugs.  High blood pressure causes  heart disease and increases the risk of stroke. Your blood pressure should be checked at least every 1 to 2 years. Ongoing high blood pressure should be treated with medicines if weight loss and exercise do not work.  If you are 25-27 years old, ask your health care provider if you should take aspirin to prevent strokes.  Diabetes screening is done by taking a blood sample to check your blood glucose level after you have not eaten for a certain period of time (fasting). If you are not overweight and you do not have risk factors for diabetes, you should be screened once every 3 years starting at age 35. If you are overweight or obese and you are 69-2 years of age, you should be screened for diabetes every year as part of your cardiovascular risk assessment.  Breast cancer screening is essential preventive care for women. You should practice "breast self-awareness." This means understanding the normal appearance and feel of your breasts and may include breast self-examination. Any changes detected, no matter how small, should be reported to a health care provider. Women in their 71s and 30s should have a clinical breast exam (CBE) by a health care provider as part of a regular health exam every 1 to 3 years. After age 73, women should have a CBE every year. Starting at age 46, women should consider having a mammogram (breast X-ray test) every year. Women who have a family history of breast cancer should talk to their health care provider about genetic screening. Women at a high risk of breast cancer should talk to their health care providers about having an MRI and a mammogram every year.  Breast cancer gene (BRCA)-related cancer risk assessment is recommended for women who have family members with BRCA-related cancers. BRCA-related cancers include breast, ovarian, tubal, and peritoneal cancers. Having family members with these cancers may be associated with an increased risk for harmful changes (mutations)  in the breast cancer genes BRCA1 and BRCA2. Results of the assessment will determine the need for genetic counseling and BRCA1 and BRCA2 testing.  Your health care provider may recommend that you be screened regularly for cancer of the pelvic organs (ovaries, uterus, and vagina). This screening involves a pelvic examination, including checking for microscopic changes to the surface of your cervix (Pap test). You may be encouraged to have this screening done every 3 years, beginning at age 42.  For women ages 64-65, health care providers may recommend pelvic exams and Pap testing every 3 years, or they may recommend the Pap and pelvic exam, combined with testing for human papilloma virus (HPV), every 5 years. Some types of HPV increase your risk of cervical cancer. Testing for HPV may also be done on women of any age with unclear Pap test results.  Other health care providers may not recommend any screening for nonpregnant women who are considered low risk for pelvic cancer and who do not have symptoms. Ask your health care provider if a screening  pelvic exam is right for you.  If you have had past treatment for cervical cancer or a condition that could lead to cancer, you need Pap tests and screening for cancer for at least 20 years after your treatment. If Pap tests have been discontinued, your risk factors (such as having a new sexual partner) need to be reassessed to determine if screening should resume. Some women have medical problems that increase the chance of getting cervical cancer. In these cases, your health care provider may recommend more frequent screening and Pap tests.  Colorectal cancer can be detected and often prevented. Most routine colorectal cancer screening begins at the age of 42 years and continues through age 66 years. However, your health care provider may recommend screening at an earlier age if you have risk factors for colon cancer. On a yearly basis, your health care provider  may provide home test kits to check for hidden blood in the stool. Use of a small camera at the end of a tube, to directly examine the colon (sigmoidoscopy or colonoscopy), can detect the earliest forms of colorectal cancer. Talk to your health care provider about this at age 64, when routine screening begins. Direct exam of the colon should be repeated every 5-10 years through age 36 years, unless early forms of precancerous polyps or small growths are found.  People who are at an increased risk for hepatitis B should be screened for this virus. You are considered at high risk for hepatitis B if:  You were born in a country where hepatitis B occurs often. Talk with your health care provider about which countries are considered high risk.  Your parents were born in a high-risk country and you have not received a shot to protect against hepatitis B (hepatitis B vaccine).  You have HIV or AIDS.  You use needles to inject street drugs.  You live with, or have sex with, someone who has hepatitis B.  You get hemodialysis treatment.  You take certain medicines for conditions like cancer, organ transplantation, and autoimmune conditions.  Hepatitis C blood testing is recommended for all people born from [birth year] through 1965 and any individual with known risks for hepatitis C.  Practice safe sex. Use condoms and avoid high-risk sexual practices to reduce the spread of sexually transmitted infections (STIs). STIs include gonorrhea, chlamydia, syphilis, trichomonas, herpes, HPV, and human immunodeficiency virus (HIV). Herpes, HIV, and HPV are viral illnesses that have no cure. They can result in disability, cancer, and death.  You should be screened for sexually transmitted illnesses (STIs) including gonorrhea and chlamydia if:  You are sexually active and are younger than 24 years.  You are older than 24 years and your health care provider tells you that you are at risk for this type of  infection.  Your sexual activity has changed since you were last screened and you are at an increased risk for chlamydia or gonorrhea. Ask your health care provider if you are at risk.  If you are at risk of being infected with HIV, it is recommended that you take a prescription medicine daily to prevent HIV infection. This is called preexposure prophylaxis (PrEP). You are considered at risk if:  You are sexually active and do not regularly use condoms or know the HIV status of your partner(s).  You take drugs by injection.  You are sexually active with a partner who has HIV.  Talk with your health care provider about whether you are at high risk of  being infected with HIV. If you choose to begin PrEP, you should first be tested for HIV. You should then be tested every 3 months for as long as you are taking PrEP.  Osteoporosis is a disease in which the bones lose minerals and strength with aging. This can result in serious bone fractures or breaks. The risk of osteoporosis can be identified using a bone density scan. Women ages 53 years and over and women at risk for fractures or osteoporosis should discuss screening with their health care providers. Ask your health care provider whether you should take a calcium supplement or vitamin D to reduce the rate of osteoporosis.  Menopause can be associated with physical symptoms and risks. Hormone replacement therapy is available to decrease symptoms and risks. You should talk to your health care provider about whether hormone replacement therapy is right for you.  Use sunscreen. Apply sunscreen liberally and repeatedly throughout the day. You should seek shade when your shadow is shorter than you. Protect yourself by wearing long sleeves, pants, a wide-brimmed hat, and sunglasses year round, whenever you are outdoors.  Once a month, do a whole body skin exam, using a mirror to look at the skin on your back. Tell your health care provider of new moles,  moles that have irregular borders, moles that are larger than a pencil eraser, or moles that have changed in shape or color.  Stay current with required vaccines (immunizations).  Influenza vaccine. All adults should be immunized every year.  Tetanus, diphtheria, and acellular pertussis (Td, Tdap) vaccine. Pregnant women should receive 1 dose of Tdap vaccine during each pregnancy. The dose should be obtained regardless of the length of time since the last dose. Immunization is preferred during the 27th-36th week of gestation. An adult who has not previously received Tdap or who does not know her vaccine status should receive 1 dose of Tdap. This initial dose should be followed by tetanus and diphtheria toxoids (Td) booster doses every 10 years. Adults with an unknown or incomplete history of completing a 3-dose immunization series with Td-containing vaccines should begin or complete a primary immunization series including a Tdap dose. Adults should receive a Td booster every 10 years.  Varicella vaccine. An adult without evidence of immunity to varicella should receive 2 doses or a second dose if she has previously received 1 dose. Pregnant females who do not have evidence of immunity should receive the first dose after pregnancy. This first dose should be obtained before leaving the health care facility. The second dose should be obtained 4-8 weeks after the first dose.  Human papillomavirus (HPV) vaccine. Females aged 13-26 years who have not received the vaccine previously should obtain the 3-dose series. The vaccine is not recommended for use in pregnant females. However, pregnancy testing is not needed before receiving a dose. If a female is found to be pregnant after receiving a dose, no treatment is needed. In that case, the remaining doses should be delayed until after the pregnancy. Immunization is recommended for any person with an immunocompromised condition through the age of 43 years if she  did not get any or all doses earlier. During the 3-dose series, the second dose should be obtained 4-8 weeks after the first dose. The third dose should be obtained 24 weeks after the first dose and 16 weeks after the second dose.  Zoster vaccine. One dose is recommended for adults aged 51 years or older unless certain conditions are present.  Measles, mumps, and  rubella (MMR) vaccine. Adults born before [birth year] generally are considered immune to measles and mumps. Adults born in [birth year] or later should have 1 or more doses of MMR vaccine unless there is a contraindication to the vaccine or there is laboratory evidence of immunity to each of the three diseases. A routine second dose of MMR vaccine should be obtained at least 28 days after the first dose for students attending postsecondary schools, health care workers, or international travelers. People who received inactivated measles vaccine or an unknown type of measles vaccine during 1963-1967 should receive 2 doses of MMR vaccine. People who received inactivated mumps vaccine or an unknown type of mumps vaccine before 1979 and are at high risk for mumps infection should consider immunization with 2 doses of MMR vaccine. For females of childbearing age, rubella immunity should be determined. If there is no evidence of immunity, females who are not pregnant should be vaccinated. If there is no evidence of immunity, females who are pregnant should delay immunization until after pregnancy. Unvaccinated health care workers born before [birth year] who lack laboratory evidence of measles, mumps, or rubella immunity or laboratory confirmation of disease should consider measles and mumps immunization with 2 doses of MMR vaccine or rubella immunization with 1 dose of MMR vaccine.  Pneumococcal 13-valent conjugate (PCV13) vaccine. When indicated, a person who is uncertain of his immunization history and has no record of immunization should receive the PCV13 vaccine. All adults  7 years of age and older should receive this vaccine. An adult aged 74 years or older who has certain medical conditions and has not been previously immunized should receive 1 dose of PCV13 vaccine. This PCV13 should be followed with a dose of pneumococcal polysaccharide (PPSV23) vaccine. Adults who are at high risk for pneumococcal disease should obtain the PPSV23 vaccine at least 8 weeks after the dose of PCV13 vaccine. Adults older than 40 years of age who have normal immune system function should obtain the PPSV23 vaccine dose at least 1 year after the dose of PCV13 vaccine.  Pneumococcal polysaccharide (PPSV23) vaccine. When PCV13 is also indicated, PCV13 should be obtained first. All adults aged 96 years and older should be immunized. An adult younger than age 72 years who has certain medical conditions should be immunized. Any person who resides in a nursing home or long-term care facility should be immunized. An adult smoker should be immunized. People with an immunocompromised condition and certain other conditions should receive both PCV13 and PPSV23 vaccines. People with human immunodeficiency virus (HIV) infection should be immunized as soon as possible after diagnosis. Immunization during chemotherapy or radiation therapy should be avoided. Routine use of PPSV23 vaccine is not recommended for American Indians, [place] Natives, or people younger than 65 years unless there are medical conditions that require PPSV23 vaccine. When indicated, people who have unknown immunization and have no record of immunization should receive PPSV23 vaccine. One-time revaccination 5 years after the first dose of PPSV23 is recommended for people aged 19-64 years who have chronic kidney failure, nephrotic syndrome, asplenia, or immunocompromised conditions. People who received 1-2 doses of PPSV23 before age 33 years should receive another dose of PPSV23 vaccine at age 61 years or later if at least 5 years have passed since  the previous dose. Doses of PPSV23 are not needed for people immunized with PPSV23 at or after age 19 years.  Meningococcal vaccine. Adults with asplenia or persistent complement component deficiencies should receive 2 doses of quadrivalent meningococcal conjugate (MenACWY-D) vaccine.  The doses should be obtained at least 2 months apart. Microbiologists working with certain meningococcal bacteria, [place] recruits, people at risk during an outbreak, and people who travel to or live in countries with a high rate of meningitis should be immunized. A first-year college student up through age 74 years who is living in a residence hall should receive a dose if she did not receive a dose on or after her 16th birthday. Adults who have certain high-risk conditions should receive one or more doses of vaccine.  Hepatitis A vaccine. Adults who wish to be protected from this disease, have certain high-risk conditions, work with hepatitis A-infected animals, work in hepatitis A research labs, or travel to or work in countries with a high rate of hepatitis A should be immunized. Adults who were previously unvaccinated and who anticipate close contact with an international adoptee during the first 60 days after arrival in the [place] from a country with a high rate of hepatitis A should be immunized.  Hepatitis B vaccine. Adults who wish to be protected from this disease, have certain high-risk conditions, may be exposed to blood or other infectious body fluids, are household contacts or sex partners of hepatitis B positive people, are clients or workers in certain care facilities, or travel to or work in countries with a high rate of hepatitis B should be immunized.  Haemophilus influenzae type b (Hib) vaccine. A previously unvaccinated person with asplenia or sickle cell disease or having a scheduled splenectomy should receive 1 dose of Hib vaccine. Regardless of previous immunization, a recipient of a  hematopoietic stem cell transplant should receive a 3-dose series 6-12 months after her successful transplant. Hib vaccine is not recommended for adults with HIV infection. Preventive Services / Frequency Ages 63 to 50 years  Blood pressure check.** / Every 3-5 years.  Lipid and cholesterol check.** / Every 5 years beginning at age 73.  Clinical breast exam.** / Every 3 years for women in their 26s and 16s.  BRCA-related cancer risk assessment.** / For women who have family members with a BRCA-related cancer (breast, ovarian, tubal, or peritoneal cancers).  Pap test.** / Every 2 years from ages 3 through 39. Every 3 years starting at age 59 through age 52 or 65 with a history of 3 consecutive normal Pap tests.  HPV screening.** / Every 3 years from ages 38 through ages 61 to 4 with a history of 3 consecutive normal Pap tests.  Hepatitis C blood test.** / For any individual with known risks for hepatitis C.  Skin self-exam. / Monthly.  Influenza vaccine. / Every year.  Tetanus, diphtheria, and acellular pertussis (Tdap, Td) vaccine.** / Consult your health care provider. Pregnant women should receive 1 dose of Tdap vaccine during each pregnancy. 1 dose of Td every 10 years.  Varicella vaccine.** / Consult your health care provider. Pregnant females who do not have evidence of immunity should receive the first dose after pregnancy.  HPV vaccine. / 3 doses over 6 months, if 25 and younger. The vaccine is not recommended for use in pregnant females. However, pregnancy testing is not needed before receiving a dose.  Measles, mumps, rubella (MMR) vaccine.** / You need at least 1 dose of MMR if you were born in 1957 or later. You may also need a 2nd dose. For females of childbearing age, rubella immunity should be determined. If there is no evidence of immunity, females who are not pregnant should be vaccinated. If there is no evidence  of immunity, females who are pregnant should delay  immunization until after pregnancy.  Pneumococcal 13-valent conjugate (PCV13) vaccine.** / Consult your health care provider.  Pneumococcal polysaccharide (PPSV23) vaccine.** / 1 to 2 doses if you smoke cigarettes or if you have certain conditions.  Meningococcal vaccine.** / 1 dose if you are age 61 to 27 years and a Market researcher living in a residence hall, or have one of several medical conditions, you need to get vaccinated against meningococcal disease. You may also need additional booster doses.  Hepatitis A vaccine.** / Consult your health care provider.  Hepatitis B vaccine.** / Consult your health care provider.  Haemophilus influenzae type b (Hib) vaccine.** / Consult your health care provider. Ages 72 to 29 years  Blood pressure check.** / Every year.  Lipid and cholesterol check.** / Every 5 years beginning at age 69 years.  Lung cancer screening. / Every year if you are aged 41-80 years and have a 30-pack-year history of smoking and currently smoke or have quit within the past 15 years. Yearly screening is stopped once you have quit smoking for at least 15 years or develop a health problem that would prevent you from having lung cancer treatment.  Clinical breast exam.** / Every year after age 86 years.  BRCA-related cancer risk assessment.** / For women who have family members with a BRCA-related cancer (breast, ovarian, tubal, or peritoneal cancers).  Mammogram.** / Every year beginning at age 59 years and continuing for as long as you are in good health. Consult with your health care provider.  Pap test.** / Every 3 years starting at age 14 years through age 59 or 44 years with a history of 3 consecutive normal Pap tests.  HPV screening.** / Every 3 years from ages 67 years through ages 34 to 39 years with a history of 3 consecutive normal Pap tests.  Fecal occult blood test (FOBT) of stool. / Every year beginning at age 66 years and continuing until age  44 years. You may not need to do this test if you get a colonoscopy every 10 years.  Flexible sigmoidoscopy or colonoscopy.** / Every 5 years for a flexible sigmoidoscopy or every 10 years for a colonoscopy beginning at age 57 years and continuing until age 40 years.  Hepatitis C blood test.** / For all people born from [birth year] through 1965 and any individual with known risks for hepatitis C.  Skin self-exam. / Monthly.  Influenza vaccine. / Every year.  Tetanus, diphtheria, and acellular pertussis (Tdap/Td) vaccine.** / Consult your health care provider. Pregnant women should receive 1 dose of Tdap vaccine during each pregnancy. 1 dose of Td every 10 years.  Varicella vaccine.** / Consult your health care provider. Pregnant females who do not have evidence of immunity should receive the first dose after pregnancy.  Zoster vaccine.** / 1 dose for adults aged 53 years or older.  Measles, mumps, rubella (MMR) vaccine.** / You need at least 1 dose of MMR if you were born in 1957 or later. You may also need a second dose. For females of childbearing age, rubella immunity should be determined. If there is no evidence of immunity, females who are not pregnant should be vaccinated. If there is no evidence of immunity, females who are pregnant should delay immunization until after pregnancy.  Pneumococcal 13-valent conjugate (PCV13) vaccine.** / Consult your health care provider.  Pneumococcal polysaccharide (PPSV23) vaccine.** / 1 to 2 doses if you smoke cigarettes or if you have certain  conditions.  Meningococcal vaccine.** / Consult your health care provider.  Hepatitis A vaccine.** / Consult your health care provider.  Hepatitis B vaccine.** / Consult your health care provider.  Haemophilus influenzae type b (Hib) vaccine.** / Consult your health care provider. Ages 66 years and over  Blood pressure check.** / Every year.  Lipid and cholesterol check.** / Every 5 years beginning at age 16  years.  Lung cancer screening. / Every year if you are aged 79-80 years and have a 30-pack-year history of smoking and currently smoke or have quit within the past 15 years. Yearly screening is stopped once you have quit smoking for at least 15 years or develop a health problem that would prevent you from having lung cancer treatment.  Clinical breast exam.** / Every year after age 37 years.  BRCA-related cancer risk assessment.** / For women who have family members with a BRCA-related cancer (breast, ovarian, tubal, or peritoneal cancers).  Mammogram.** / Every year beginning at age 74 years and continuing for as long as you are in good health. Consult with your health care provider.  Pap test.** / Every 3 years starting at age 31 years through age 40 or 75 years with 3 consecutive normal Pap tests. Testing can be stopped between 65 and 70 years with 3 consecutive normal Pap tests and no abnormal Pap or HPV tests in the past 10 years.  HPV screening.** / Every 3 years from ages 85 years through ages 69 or 53 years with a history of 3 consecutive normal Pap tests. Testing can be stopped between 65 and 70 years with 3 consecutive normal Pap tests and no abnormal Pap or HPV tests in the past 10 years.  Fecal occult blood test (FOBT) of stool. / Every year beginning at age 33 years and continuing until age 53 years. You may not need to do this test if you get a colonoscopy every 10 years.  Flexible sigmoidoscopy or colonoscopy.** / Every 5 years for a flexible sigmoidoscopy or every 10 years for a colonoscopy beginning at age 53 years and continuing until age 66 years.  Hepatitis C blood test.** / For all people born from [birth year] through 1965 and any individual with known risks for hepatitis C.  Osteoporosis screening.** / A one-time screening for women ages 65 years and over and women at risk for fractures or osteoporosis.  Skin self-exam. / Monthly.  Influenza vaccine. / Every year.  Tetanus,  diphtheria, and acellular pertussis (Tdap/Td) vaccine.** / 1 dose of Td every 10 years.  Varicella vaccine.** / Consult your health care provider.  Zoster vaccine.** / 1 dose for adults aged 62 years or older.  Pneumococcal 13-valent conjugate (PCV13) vaccine.** / Consult your health care provider.  Pneumococcal polysaccharide (PPSV23) vaccine.** / 1 dose for all adults aged 67 years and older.  Meningococcal vaccine.** / Consult your health care provider.  Hepatitis A vaccine.** / Consult your health care provider.  Hepatitis B vaccine.** / Consult your health care provider.  Haemophilus influenzae type b (Hib) vaccine.** / Consult your health care provider. ** Family history and personal history of risk and conditions may change your health care provider's recommendations.   This information is not intended to replace advice given to you by your health care provider. Make sure you discuss any questions you have with your health care provider.   Document Released: 02/25/2001 Document Revised: 01/20/2014 Document Reviewed: 05/27/2010 Elsevier Interactive Patient Education [employer].

## 2015-05-29 NOTE — Progress Notes (Signed)
Subjective:    Patient ID: Kristen [name], female    DOB: 1975-06-14, 40 y.o.   MRN: [id number]  Chief Complaint  Patient presents with  . Annual Exam    HPI Patient is in today for annual exam.  Patient was recently seen in hospital face dropping on one side and had MRI which was negative and they diagnosed with complex migraines with photophobia.  Patient reports needs to follow up with neurologist, referral has been placed.  Patient reports that she has been having low blood pressure and has not been exercises and has been suffering from stream fatigue due to the blood pressure medication.  Patient reports her blood pressures only spike when she is having migraine episodes, she reports the hypertension medication has helped with the pain that was in her neck, arms, and hands. Patient reports that she has been waking up with numbness in bilateral arms.  Patient reports some episodes of lockjaw and elevated blood pressure happening together but resolved after taking a Asprin within 3 hours and patient report she has had 5 pound weight gain in a week even with loss of appetite, and irregular menestration.  Denies CP/palp/SOB/congestion/fevers/GI or GU c/o. Taking meds as prescribed.    Past Medical History  Diagnosis Date  . Thyroid disease   . Hypertension   . ADD (attention deficit disorder)   . Fibromyalgia   . Chicken pox as a child  . Allergy     seasonal  . Asthma     mild, intermittent  . Recurrent UTI   . Anemia     blood loss  . Hyperlipidemia   . Depression 2002    post partum  . Renal lithiasis 09/24/2010  . Concussion 09/24/2010  . History of concussion 09/24/2010  . 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 Female bladder prolapse, acquired 09/24/2010  . HTN (hypertension) 09/24/2010  . Subacute and chronic vaginitis 09/24/2010  . Lymphadenopathy 09/24/2010  . Migraine aura, persistent, with cerebral infarct, status over 72 hours 09/24/2010  . History of migraines 09/24/2010  . Chest  pain, atypical 10/08/2010  . Neck pain, musculoskeletal 12/16/2010  . Poor concentration 12/16/2010  . Bronchitis, acute 12/16/2010  . Contraceptive management 02/17/2011  . Interstitial cystitis 02/18/2011  . RUQ pain 05/27/2011  . Decreased libido 09/26/2011  . Migraine 09/24/2010  . Overweight(278.02) 12/26/2011  . Abnormal cervical cytology 09/26/2011    LGSIL in 2011, patient reports repeat pap was normal, has had intermittent abnormals with bx in past but always normalized, has had cryotherapy with good results Menarche at 17, irregular without birth control at times, very heavy LMP 08/26/2011 No gyn concerns, MGM last year normal   . Preventative health care 11/28/2012  . Headache 09/26/2013  . Family history of heart disease   . Hyperlipidemia   . Tick bite 12/04/2013  . Vaginitis and vulvovaginitis 05/21/2014  . Transient left leg weakness 05/21/2014  . Overweight 05/29/2015    Past Surgical History  Procedure Laterality Date  . Incontinence surgery  2003    Family History  Problem Relation Age of Onset  . Heart attack Father   . Hyperlipidemia Father   . Hypertension Father   . Alcohol abuse Father   . Heart disease Father   . Heart attack Brother 45  . Hypertension Brother   . Heart disease Brother 45    MI  . Hyperlipidemia Brother   . Dementia Maternal Grandfather 59    early stages  . Stroke Maternal Grandfather   .  Heart attack Paternal Grandfather   . Stroke Paternal Grandfather   . Heart disease Paternal Grandfather   . Alcohol abuse Paternal Grandfather   . Hyperlipidemia Paternal Grandfather   . Hypertension Paternal Grandfather   . Osteoporosis Mother   . Hyperlipidemia Mother   . Heart disease Maternal Grandmother     Social History   Social History  . Marital Status: Married    Spouse Name: N/A  . Number of Children: N/A  . Years of Education: N/A   Occupational History  . Not on file.   Social History Main Topics  . Smoking status: Never Smoker     . Smokeless tobacco: Never Used  . Alcohol Use: Yes     Comment: rarely  . Drug Use: No  . Sexual Activity:    Partners: Male    Birth Control/ Protection: Pill   Other Topics Concern  . Not on file   Social History Narrative   Lives with children and works as a Teacher, adult educationmassage therapist    Outpatient Prescriptions Prior to Visit  Medication Sig Dispense Refill  . BLISOVI 24 FE 1-20 MG-MCG(24) tablet TAKE 1 TABLET BY MOUTH DAILY. 84 tablet 3  . acyclovir (ZOVIRAX) 200 MG capsule TAKE 1 CAPSULE (200 MG TOTAL) BY MOUTH 2 (TWO) TIMES DAILY. 180 capsule 3  . amphetamine-dextroamphetamine (ADDERALL) 20 MG tablet One tablet by mouth twice daily 60 tablet 0  . BYSTOLIC 5 MG tablet TAKE 1 TABLET BY MOUTH ONCE DAILY 90 tablet 2  . EPINEPHrine 0.3 mg/0.3 mL IJ SOAJ injection Inject 0.3 mLs (0.3 mg total) into the muscle once. 1 Device 2  . HYDROcodone-acetaminophen (VICODIN) 5-500 MG per tablet Take 1 tablet by mouth every 8 (eight) hours as needed for pain. 120 tablet 0  . lidocaine (XYLOCAINE) 2 % jelly Apply to affected area daily prn 30 mL 1  . SYNTHROID 137 MCG tablet TAKE 1 TABLET BY MOUTH EVERY DAY 90 tablet 1  . budesonide-formoterol (SYMBICORT) 160-4.5 MCG/ACT inhaler Inhale 2 puffs into the lungs 2 (two) times daily. (Patient not taking: Reported on 05/29/2015) 1 Inhaler 4  . pentosan polysulfate (ELMIRON) 100 MG capsule Take 1 capsule (100 mg total) by mouth 3 (three) times daily before meals. (Patient not taking: Reported on 05/29/2015) 90 capsule 5  . albuterol (PROVENTIL HFA;VENTOLIN HFA) 108 (90 BASE) MCG/ACT inhaler Inhale 1 puff into the lungs every 4 (four) hours as needed for wheezing. 1-2 puffs po bid prn asthma, as directed by pulmonolgy (Patient not taking: Reported on 05/29/2015) 1 Inhaler 0  . Alum & Mag Hydroxide-Simeth (MAGIC MOUTHWASH W/LIDOCAINE) SOLN Take 5 mLs by mouth 3 (three) times daily as needed for mouth pain. 100 mL 2  . amoxicillin (AMOXIL) 500 MG capsule Take 1  capsule (500 mg total) by mouth 3 (three) times daily. Take for 10 days 30 capsule 0  . cephALEXin (KEFLEX) 500 MG capsule Take 1 capsule (500 mg total) by mouth 3 (three) times daily. 30 capsule 0  . fluconazole (DIFLUCAN) 150 MG tablet Take 1 tablet (150 mg total) by mouth once as needed. Take after your complete your antibiotic prescription standard as needed 12 tablet 1  . lidocaine (XYLOCAINE) 2 % jelly APPLY TO AFFECTED AREA DAILY AS NEEDED 20 mL 0  . metroNIDAZOLE (FLAGYL) 500 MG tablet Take 1 tablet (500 mg total) by mouth 3 (three) times daily. 15 tablet 0  . neomycin-polymyxin-hydrocortisone (CORTISPORIN) otic solution Place 3 drops into both ears 4 (four) times daily. (Patient  not taking: Reported on 05/29/2015) 10 mL 1  . nitrofurantoin, macrocrystal-monohydrate, (MACROBID) 100 MG capsule TAKE 1 CAPSULE FOR 1 DOSE AS NEEDED FOR PREVENTION OF UTI *AFTER SELF CATH, SWIMMING, HOT TUB, SEX* (Patient not taking: Reported on 05/29/2015) 90 capsule 1  . Norethindrone Acetate-Ethinyl Estrad-FE (LOMEDIA 24 FE) 1-20 MG-MCG(24) tablet Take 1 tablet by mouth daily. 84 tablet 3   No facility-administered medications prior to visit.    Allergies  Allergen Reactions  . Peanut-Containing Drug Products Anaphylaxis  . Terazol [Terconazole]     Burning sensation    Review of Systems  Constitutional: Positive for malaise/fatigue. Negative for fever.  HENT: Negative for congestion.   Eyes: Positive for photophobia. Negative for blurred vision.  Respiratory: Negative for shortness of breath.   Cardiovascular: Negative for chest pain, palpitations and leg swelling.  Gastrointestinal: Negative for nausea, abdominal pain and blood in stool.  Genitourinary: Negative for dysuria and frequency.  Musculoskeletal: Negative for falls.  Skin: Negative for rash.  Neurological: Negative for dizziness, loss of consciousness and headaches.  Endo/Heme/Allergies: Negative for environmental allergies.   Psychiatric/Behavioral: Negative for depression. The patient is not nervous/anxious.        Objective:    Physical Exam  Constitutional: She is oriented to person, place, and time. She appears well-developed and well-nourished. No distress.  HENT:  Head: Normocephalic and atraumatic.  Eyes: Conjunctivae are normal.  Neck: Neck supple. No thyromegaly present.  Cardiovascular: Normal rate, regular rhythm and normal heart sounds.   No murmur heard. Pulmonary/Chest: Effort normal and breath sounds normal. No respiratory distress.  Abdominal: Soft. Bowel sounds are normal. She exhibits no distension and no mass. There is no tenderness.  Musculoskeletal: She exhibits no edema.  Lymphadenopathy:    She has no cervical adenopathy.  Neurological: She is alert and oriented to person, place, and time.  Skin: Skin is warm and dry.  Psychiatric: She has a normal mood and affect. Her behavior is normal.    BP 120/70 mmHg  Pulse 69  Temp(Src) 98.2 F (36.8 C) (Oral)  Ht  (1.575 m)  Wt 162 lb 8 oz (73.71 kg)  BMI 29.71 kg/m2  SpO2 95%  LMP 05/01/2015 Wt Readings from Last 3 Encounters:  05/29/15 162 lb 8 oz (73.71 kg)  05/23/15 155 lb (70.308 kg)  11/09/14 159 lb (72.122 kg)     Lab Results  Component Value Date   WBC 10.3 05/23/2015   HGB 14.3 05/23/2015   HCT 42.0 05/23/2015   PLT 223 05/23/2015   GLUCOSE 83 05/23/2015   CHOL 245* 11/09/2014   TRIG * 11/09/2014    483.0 Triglyceride is over 400; calculations on Lipids are invalid.   HDL 38.00* 11/09/2014   LDLDIRECT 107.0 11/09/2014   LDLCALC 136* 11/22/2012   ALT 16 05/23/2015   AST 19 05/23/2015   NA 139 05/23/2015   K 4.5 05/23/2015   CL 105 05/23/2015   CREATININE 0.80 05/23/2015   BUN 16 05/23/2015   CO2 23 05/23/2015   TSH 0.36 11/09/2014   INR 1.09 05/23/2015    Lab Results  Component Value Date   TSH 0.36 11/09/2014   Lab Results  Component Value Date   WBC 10.3 05/23/2015   HGB 14.3 05/23/2015    HCT 42.0 05/23/2015   MCV 90.2 05/23/2015   PLT 223 05/23/2015   Lab Results  Component Value Date   NA 139 05/23/2015   K 4.5 05/23/2015   CO2 23 05/23/2015   GLUCOSE  83 05/23/2015   BUN 16 05/23/2015   CREATININE 0.80 05/23/2015   BILITOT 0.6 05/23/2015   ALKPHOS 45 05/23/2015   AST 19 05/23/2015   ALT 16 05/23/2015   PROT 7.0 05/23/2015   ALBUMIN 3.6 05/23/2015   CALCIUM 9.4 05/23/2015   ANIONGAP 11 05/23/2015   GFR 87.44 11/09/2014   Lab Results  Component Value Date   CHOL 245* 11/09/2014   Lab Results  Component Value Date   HDL 38.00* 11/09/2014   Lab Results  Component Value Date   LDLCALC 136* 11/22/2012   Lab Results  Component Value Date   TRIG * 11/09/2014    483.0 Triglyceride is over 400; calculations on Lipids are invalid.   Lab Results  Component Value Date   CHOLHDL 6 11/09/2014   No results found for: HGBA1C     Assessment & Plan:   Problem List Items Addressed This Visit    Preventative health care    Patient encouraged to maintain heart healthy diet, regular exercise, adequate sleep. Consider daily probiotics. Take medications as prescribed. Given and reviewed copy of ACP documents from [employer] and encouraged to complete and return      Overweight   Migraine - Primary    Seen in ER recently with jaw pain and facial droop and diagnosed with neurologic migraine after MRI, will refer to neurology for further consideration      Relevant Medications   nebivolol (BYSTOLIC) 5 MG tablet   HYDROcodone-acetaminophen (VICODIN) 5-500 MG tablet   EPINEPHrine 0.3 mg/0.3 mL IJ SOAJ injection    Other Visit Diagnoses    HSV infection        Relevant Medications    SYNTHROID 137 MCG tablet    nebivolol (BYSTOLIC) 5 MG tablet    albuterol (PROVENTIL HFA;VENTOLIN HFA) 108 (90 Base) MCG/ACT inhaler    acyclovir (ZOVIRAX) 200 MG capsule    amphetamine-dextroamphetamine (ADDERALL) 20 MG tablet    HYDROcodone-acetaminophen (VICODIN)  5-500 MG tablet    lidocaine (XYLOCAINE) 2 % jelly    EPINEPHrine 0.3 mg/0.3 mL IJ SOAJ injection    nitrofurantoin, macrocrystal-monohydrate, (MACROBID) 100 MG capsule    amphetamine-dextroamphetamine (ADDERALL) 20 MG tablet    amphetamine-dextroamphetamine (ADDERALL) 20 MG tablet    Other Relevant Orders    Ambulatory referral to Neurology    CBC w/Diff    Rocky mtn spotted fvr ab, IgM-blood    Estrogens, total    Progesterone    Lyme Disease, IgM, Early Test w/ Rflx    Sedimentation rate    Lipid panel    Comprehensive metabolic panel    TSH    Testosterone, Total, LC/MS/MS       I have discontinued Ms. Kullman's magic mouthwash w/lidocaine, cephALEXin, Norethindrone Acetate-Ethinyl Estrad-FE, fluconazole, metroNIDAZOLE, amoxicillin, and neomycin-polymyxin-hydrocortisone. I have changed her BYSTOLIC to nebivolol. I have also changed her SYNTHROID, amphetamine-dextroamphetamine, HYDROcodone-acetaminophen, amphetamine-dextroamphetamine, and amphetamine-dextroamphetamine. Additionally, I am having her maintain her pentosan polysulfate, BLISOVI 24 FE, budesonide-formoterol, albuterol, acyclovir, lidocaine, EPINEPHrine, and nitrofurantoin (macrocrystal-monohydrate).  Meds ordered this encounter  Medications  . DISCONTD: amphetamine-dextroamphetamine (ADDERALL) 20 MG tablet    Sig: Take 20 mg by mouth daily.  Marland Kitchen DISCONTD: amphetamine-dextroamphetamine (ADDERALL) 20 MG tablet    Sig: Take 20 mg by mouth daily.  Marland Kitchen SYNTHROID 137 MCG tablet    Sig: Take 1 tablet (137 mcg total) by mouth daily.    Dispense:  90 tablet    Refill:  1  . nebivolol (BYSTOLIC) 5 MG tablet  Sig: Take 1 tablet (5 mg total) by mouth daily. As needed    Dispense:  90 tablet    Refill:  2  . albuterol (PROVENTIL HFA;VENTOLIN HFA) 108 (90 Base) MCG/ACT inhaler    Sig: Inhale 1 puff into the lungs every 4 (four) hours as needed for wheezing. 1-2 puffs po bid prn asthma, as directed by pulmonolgy    Dispense:  1  Inhaler    Refill:  6  . acyclovir (ZOVIRAX) 200 MG capsule    Sig: TAKE 1 CAPSULE (200 MG TOTAL) BY MOUTH 2 (TWO) TIMES DAILY.    Dispense:  180 capsule    Refill:  3  . amphetamine-dextroamphetamine (ADDERALL) 20 MG tablet    Sig: Take 1 tablet (20 mg total) by mouth 2 (two) times daily. May  2017    Dispense:  60 tablet    Refill:  0  . HYDROcodone-acetaminophen (VICODIN) 5-500 MG tablet    Sig: Take 1 tablet by mouth every 8 (eight) hours as needed for pain.    Dispense:  120 tablet    Refill:  0  . lidocaine (XYLOCAINE) 2 % jelly    Sig: Apply to affected area daily prn    Dispense:  30 mL    Refill:  1  . EPINEPHrine 0.3 mg/0.3 mL IJ SOAJ injection    Sig: Inject 0.3 mLs (0.3 mg total) into the muscle once.    Dispense:  1 Device    Refill:  2  . nitrofurantoin, macrocrystal-monohydrate, (MACROBID) 100 MG capsule    Sig: TAKE 1 CAPSULE FOR 1 DOSE AS NEEDED FOR PREVENTION OF UTI *AFTER SELF CATH, SWIMMING, HOT TUB, SEX*    Dispense:  90 capsule    Refill:  1  . amphetamine-dextroamphetamine (ADDERALL) 20 MG tablet    Sig: Take 1 tablet (20 mg total) by mouth 2 (two) times daily. July 2017    Dispense:  60 tablet    Refill:  0  . amphetamine-dextroamphetamine (ADDERALL) 20 MG tablet    Sig: Take 1 tablet (20 mg total) by mouth 2 (two) times daily. June 2017    Dispense:  60 tablet    Refill:  0     Danise Edge, MD

## 2015-05-29 NOTE — Assessment & Plan Note (Signed)
Feels severe fatigue on Bystolic and notes generally her BP is low 100 over 60 ish. Then when her neurologic symptoms kick up

## 2015-05-30 LAB — PROGESTERONE: Progesterone: <0.5 ng/mL

## 2015-05-30 LAB — TESTOSTERONE, TOTAL, LC/MS/MS: TESTOSTERONE, TOTAL, LC-MS-MS: 8 ng/dL (ref 2–45)

## 2015-05-31 ENCOUNTER — Other Ambulatory Visit (INDEPENDENT_AMBULATORY_CARE_PROVIDER_SITE_OTHER): Payer: Managed Care, Other (non HMO)

## 2015-05-31 DIAGNOSIS — R946 Abnormal results of thyroid function studies: Secondary | ICD-10-CM

## 2015-05-31 LAB — T4, FREE: Free T4: 0.88 ng/dL (ref 0.60–1.60)

## 2015-06-01 LAB — ESTROGENS, TOTAL: Estrogen: 141.9 pg/mL

## 2015-06-04 LAB — ROCKY MTN SPOTTED FVR ABS PNL(IGG+IGM)
RMSF IGG: NOT DETECTED
RMSF IGM: NOT DETECTED

## 2015-06-21 ENCOUNTER — Other Ambulatory Visit: Payer: Self-pay | Admitting: Family Medicine

## 2015-06-21 ENCOUNTER — Encounter: Payer: Self-pay | Admitting: Family Medicine

## 2015-06-21 MED ORDER — CYCLOBENZAPRINE HCL 10 MG PO TABS: 10.0000 mg | ORAL_TABLET | Freq: Three times a day (TID) | ORAL | Status: DC | PRN

## 2015-06-21 MED ORDER — RIZATRIPTAN BENZOATE 10 MG PO TABS
10.0000 mg | ORAL_TABLET | ORAL | Status: DC | PRN
Start: 2015-06-21 — End: 2015-07-11

## 2015-07-11 ENCOUNTER — Ambulatory Visit (INDEPENDENT_AMBULATORY_CARE_PROVIDER_SITE_OTHER): Payer: Managed Care, Other (non HMO) | Admitting: Neurology

## 2015-07-11 ENCOUNTER — Encounter: Payer: Self-pay | Admitting: Neurology

## 2015-07-11 VITALS — BP 118/80 | HR 88 | Ht 62.0 in | Wt 162.1 lb

## 2015-07-11 DIAGNOSIS — G44221 Chronic tension-type headache, intractable: Secondary | ICD-10-CM | POA: Diagnosis not present

## 2015-07-11 DIAGNOSIS — R5383 Other fatigue: Secondary | ICD-10-CM

## 2015-07-11 DIAGNOSIS — G43809 Other migraine, not intractable, without status migrainosus: Secondary | ICD-10-CM

## 2015-07-11 DIAGNOSIS — R5381 Other malaise: Secondary | ICD-10-CM

## 2015-07-11 DIAGNOSIS — G4440 Drug-induced headache, not elsewhere classified, not intractable: Secondary | ICD-10-CM

## 2015-07-11 DIAGNOSIS — G4441 Drug-induced headache, not elsewhere classified, intractable: Secondary | ICD-10-CM | POA: Diagnosis not present

## 2015-07-11 MED ORDER — SUMATRIPTAN SUCCINATE 50 MG PO TABS: 50.0000 mg | ORAL_TABLET | ORAL | Status: DC | PRN

## 2015-07-11 NOTE — Patient Instructions (Signed)
1.  Start flexeril 10mg  at bedtime 2.  Reduce excedrin, tylenol, Aleve limit to twice week 3.  For severe migraine, start imitrex 50mg  at headache onset  Return to clinic in 2 months

## 2015-07-11 NOTE — Progress Notes (Signed)
[facility] Neurology [facility] Clinic Note - Initial Visit   Date: 07/11/2015  [name] MRN: [id number] DOB: 04/03/1975   Dear Dr. Abner Greenspan:  Thank you for your kind referral of Kristen [name] for consultation of headaches. Although her history is well known to you, please allow [place] to reiterate it for the purpose of our medical record. The patient was accompanied to the clinic by self.    History of Present Illness: Kristen [name] is a 40 y.o. right-handed Caucasian female with ADHD, hypothyroidism, fibromyalgia, and asthma presenting for evaluation of migraines.    She presented to the ER on 5/10 with right sided numbness starting in the jaw and radiating into the face and shoulder which self-resolved within 2 hours.  She also felt that the right side of the face was not moving, so went to the ER.  Following improvement of the symptoms, she had onset of a photophobic headache and severe jaw pain. Because of her lateralizing symptoms and vascular risk factors, MRI brain was performed which did not show any acute process.  She was treated for complex migraine with compazine, benadryl, and toradol which helped with her arm paresthesias, but she continues to have jaw tightness for about another week.  She reports having a similar spell in 2015 occuring on the left side.    She developed migraines in 8th grade.  She was seen by Dr. Sharene Skeans and started on preventative medication, but does not recall the name.  Since the age of 24, her headaches spontaneously resolved after she became pregnant with her first child.  She has only had 3 migraines since this this time.  Migraines are associated with nausea, photophobia, phonophobia.    She continues to feel tired all time time and heaviness of her arms and complains of joint pain involving the elbow and hands.   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  She complains of dropping things especially when cooking and feels  clumsy.  She denies numbness or tingling.    Since her ER visit, she complains of daily headache which is band-like, described as a dull ache. Pain is ranked as 2-3/10. She takes excedrin 3-4 times per week for the past month.    Out-side paper records, electronic medical record, and images have been reviewed where available and summarized as:  Lab Results  Component Value Date   TSH 0.32* 05/29/2015   Lab Results  Component Value Date   VITAMINB12 358 05/11/2014   Lab Results  Component Value Date   ESRSEDRATE 19 05/29/2015   MRI brain wo contrast 05/23/2015:  Negative  Past Medical History  Diagnosis Date  . Thyroid disease   . Hypertension   . ADD (attention deficit disorder)   . Fibromyalgia   . Chicken pox as a child  . Allergy     seasonal  . Asthma     mild, intermittent  . Recurrent UTI   . Anemia     blood loss  . Hyperlipidemia   . Depression 2002    post partum  . Renal lithiasis 09/24/2010  . Concussion 09/24/2010  . History of concussion 09/24/2010  . 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 Female bladder prolapse, acquired 09/24/2010  . HTN (hypertension) 09/24/2010  . Subacute and chronic vaginitis 09/24/2010  . Lymphadenopathy 09/24/2010  . Migraine aura, persistent, with cerebral infarct, status over 72 hours 09/24/2010  . History of migraines 09/24/2010  . Chest pain, atypical  10/08/2010  . Neck pain, musculoskeletal 12/16/2010  . Poor concentration 12/16/2010  . Bronchitis, acute 12/16/2010  . Contraceptive management 02/17/2011  . Interstitial cystitis 02/18/2011  . RUQ pain 05/27/2011  . Decreased libido 09/26/2011  . Migraine 09/24/2010  . Overweight(278.02) 12/26/2011  . Abnormal cervical cytology 09/26/2011    LGSIL in 2011, patient reports repeat pap was normal, has had intermittent abnormals with bx in past but always normalized, has had cryotherapy with good results Menarche at 17, irregular without birth control at times, very heavy LMP 08/26/2011 No gyn concerns,  MGM last year normal   . Preventative health care 11/28/2012  . Headache 09/26/2013  . Family history of heart disease   . Hyperlipidemia   . Tick bite 12/04/2013  . Vaginitis and vulvovaginitis 05/21/2014  . Transient left leg weakness 05/21/2014  . Overweight 05/29/2015    Past Surgical History  Procedure Laterality Date  . Incontinence surgery  2003     Medications:  Outpatient Encounter Prescriptions as of 07/11/2015  Medication Sig  . acyclovir (ZOVIRAX) 200 MG capsule TAKE 1 CAPSULE (200 MG TOTAL) BY MOUTH 2 (TWO) TIMES DAILY.  Marland Kitchen albuterol (PROVENTIL HFA;VENTOLIN HFA) 108 (90 Base) MCG/ACT inhaler Inhale 1 puff into the lungs every 4 (four) hours as needed for wheezing. 1-2 puffs po bid prn asthma, as directed by pulmonolgy  . amphetamine-dextroamphetamine (ADDERALL) 20 MG tablet Take 1 tablet (20 mg total) by mouth 2 (two) times daily. May  2017  . BLISOVI 24 FE 1-20 MG-MCG(24) tablet TAKE 1 TABLET BY MOUTH DAILY.  . budesonide-formoterol (SYMBICORT) 160-4.5 MCG/ACT inhaler Inhale 2 puffs into the lungs 2 (two) times daily.  . cyclobenzaprine (FLEXERIL) 10 MG tablet Take 1 tablet (10 mg total) by mouth 3 (three) times daily as needed for muscle spasms.  Marland Kitchen EPINEPHrine 0.3 mg/0.3 mL IJ SOAJ injection Inject 0.3 mLs (0.3 mg total) into the muscle once.  Marland Kitchen HYDROcodone-acetaminophen (VICODIN) 5-500 MG tablet Take 1 tablet by mouth every 8 (eight) hours as needed for pain.  Marland Kitchen lidocaine (XYLOCAINE) 2 % jelly Apply to affected area daily prn  . nebivolol (BYSTOLIC) 5 MG tablet Take 1 tablet (5 mg total) by mouth daily. As needed  . nitrofurantoin, macrocrystal-monohydrate, (MACROBID) 100 MG capsule TAKE 1 CAPSULE FOR 1 DOSE AS NEEDED FOR PREVENTION OF UTI *AFTER SELF CATH, SWIMMING, HOT TUB, SEX*  . pentosan polysulfate (ELMIRON) 100 MG capsule Take 1 capsule (100 mg total) by mouth 3 (three) times daily before meals.  Marland Kitchen SYNTHROID 137 MCG tablet Take 1 tablet (137 mcg total) by mouth daily.    . [DISCONTINUED] rizatriptan (MAXALT) 10 MG tablet Take 1 tablet (10 mg total) by mouth as needed for migraine. May repeat in 2 hours if needed  . SUMAtriptan (IMITREX) 50 MG tablet Take 1 tablet (50 mg total) by mouth every 2 (two) hours as needed for migraine. May repeat in 2 hours if headache persists or recurs.  . [DISCONTINUED] amphetamine-dextroamphetamine (ADDERALL) 20 MG tablet Take 1 tablet (20 mg total) by mouth 2 (two) times daily. July 2017  . [DISCONTINUED] amphetamine-dextroamphetamine (ADDERALL) 20 MG tablet Take 1 tablet (20 mg total) by mouth 2 (two) times daily. June 2017   No facility-administered encounter medications on file as of 07/11/2015.     Allergies:  Allergies  Allergen Reactions  . Peanut-Containing Drug Products Anaphylaxis  . Terazol [Terconazole]     Burning sensation    Family History: Family History  Problem Relation Age of Onset  .  Heart attack Father   . Hyperlipidemia Father   . Hypertension Father   . Alcohol abuse Father   . Heart disease Father   . Heart attack Brother 45  . Hypertension Brother   . Heart disease Brother 45    MI  . Hyperlipidemia Brother   . Dementia Maternal Grandfather [id number]    early stages  . Stroke Maternal Grandfather   . Heart attack Paternal Grandfather   . Stroke Paternal Grandfather   . Heart disease Paternal Grandfather   . Alcohol abuse Paternal Grandfather   . Hyperlipidemia Paternal Grandfather   . Hypertension Paternal Grandfather   . Osteoporosis Mother   . Hyperlipidemia Mother   . Heart disease Maternal Grandmother     Social History: Social History  Substance Use Topics  . Smoking status: Never Smoker   . Smokeless tobacco: Never Used  . Alcohol Use: 0.0 oz/week    0 Standard drinks or equivalent per week     Comment: Rarely   Social History   Social History Narrative   Lives with husband 2 children in a one story home.     Works as a Teacher, adult educationmassage therapist.     Education: college.     Review of Systems:  CONSTITUTIONAL: No fevers, chills, night sweats, or weight loss.   EYES: No visual changes or eye pain ENT: No hearing changes.  No history of nose bleeds.   RESPIRATORY: No cough, wheezing and shortness of breath.   CARDIOVASCULAR: Negative for chest pain, and palpitations.   GI: Negative for abdominal discomfort, blood in stools or black stools.  No recent change in bowel habits.   GU:  No history of incontinence.   MUSCLOSKELETAL: No history of joint pain or swelling.  No myalgias.   SKIN: Negative for lesions, rash, and itching.   HEMATOLOGY/ONCOLOGY: Negative for prolonged bleeding, bruising easily, and swollen nodes.  No history of cancer.   ENDOCRINE: Negative for cold or heat intolerance, polydipsia or goiter.   PSYCH:  +depression or anxiety symptoms.   NEURO: As Above.   Vital Signs:  BP 118/80 mmHg  Pulse 88  Ht 5\' 2"  (1.575 m)  Wt 162 lb 2 oz (73.539 kg)  BMI 29.65 kg/m2  SpO2 99%   General Medical Exam:   General:  Well appearing, comfortable.   Eyes/ENT: see cranial nerve examination.   Neck: No masses appreciated.  Full range of motion without tenderness.  No carotid bruits. Respiratory:  Clear to auscultation, good air entry bilaterally.   Cardiac:  Regular rate and rhythm, no murmur.   Extremities:  No deformities, edema, or skin discoloration.  Skin:  No rashes or lesions.  Neurological Exam: MENTAL STATUS including orientation to time, place, person, recent and remote memory, attention span and concentration, language, and fund of knowledge is normal.  Speech is not dysarthric.  CRANIAL NERVES: II:  No visual field defects.  Unremarkable fundi.   III-IV-VI: Pupils equal round and reactive to light.  Normal conjugate, extra-ocular eye movements in all directions of gaze.  No nystagmus.  No ptosis V:  Normal facial sensation.     VII:  Normal facial symmetry and movements.  No pathologic facial reflexes.  VIII:  Normal hearing and  vestibular function.   IX-X:  Normal palatal movement.   XI:  Normal shoulder shrug and head rotation.   XII:  Normal tongue strength and range of motion, no deviation or fasciculation.  MOTOR:  No atrophy, fasciculations or abnormal movements.  No pronator drift.  Tone is normal.    Right Upper Extremity:    Left Upper Extremity:    Deltoid  5/5   Deltoid  5/5   Biceps  5/5   Biceps  5/5   Triceps  5/5   Triceps  5/5   Wrist extensors  5/5   Wrist extensors  5/5   Wrist flexors  5/5   Wrist flexors  5/5   Finger extensors  5/5   Finger extensors  5/5   Finger flexors  5/5   Finger flexors  5/5   Dorsal interossei  5/5   Dorsal interossei  5/5   Abductor pollicis  5/5   Abductor pollicis  5/5   Tone (Ashworth scale)  0  Tone (Ashworth scale)  0   Right Lower Extremity:    Left Lower Extremity:    Hip flexors  5/5   Hip flexors  5/5   Hip extensors  5/5   Hip extensors  5/5   Knee flexors  5/5   Knee flexors  5/5   Knee extensors  5/5   Knee extensors  5/5   Dorsiflexors  5/5   Dorsiflexors  5/5   Plantarflexors  5/5   Plantarflexors  5/5   Toe extensors  5/5   Toe extensors  5/5   Toe flexors  5/5   Toe flexors  5/5   Tone (Ashworth scale)  0  Tone (Ashworth scale)  0   MSRs:  Right                                                                 Left brachioradialis 2+  brachioradialis 2+  biceps 2+  biceps 2+  triceps 2+  triceps 2+  patellar 2+  patellar 2+  ankle jerk 2+  ankle jerk 2+  Hoffman no  Hoffman no  plantar response down  plantar response down   SENSORY: Subjective loss of temperature and pin prick distally in the legs.  Vibration and light touch intact.  Romberg's sign absent.   COORDINATION/GAIT: Normal finger-to- nose-finger and heel-to-shin.  Intact rapid alternating movements bilaterally.  Able to rise from a chair without using arms.  Gait narrow based and stable. Tandem and stressed gait intact.    IMPRESSION: 1.  Episodic complex migraine  manifesting with right sensory changes and facial weakness, jaw pain  - MRI brain and US carotids reviewed which is normal  - No relief with Maxalt, so will switch to Imitrex 50mg  at headache onset and ok to repeat in 2 hours  2.  Chronic tension headache  - Start flexeril 10mg  at bedtime for the next two weeks, if no improvement start nortriptyline 10mg  as preventative   - Avoid topiramate due to history of kidney stones  3.  Medication overuse headaches  - Limit excedrin, tylenol, and other prn OTC medications to twice per week  4.  Generalized fatigue  - No focal weakness on exam  - Unclear if this is related to thyroid disease or being on a beta-blocker, will request she discuss this further with PCP  - If no improvement, NCS/EMG can be performed going forward but my overall suspicion for something neurological causing this is low  Return to clinic in 2 months.  The duration of this appointment visit was 60 minutes of face-to-face time with the patient.  Greater than 50% of this time was spent in counseling, explanation of diagnosis, planning of further management, and coordination of care.   Thank you for allowing me to participate in patient's care.  If I can answer any additional questions, I would be pleased to do so.    Sincerely,    Donika K. Posey Pronto, DO

## 2015-07-19 ENCOUNTER — Other Ambulatory Visit: Payer: Self-pay | Admitting: Family Medicine

## 2015-07-19 MED ORDER — NORETHIN ACE-ETH ESTRAD-FE 1-20 MG-MCG(24) PO TABS: 1.0000 | ORAL_TABLET | Freq: Every day | ORAL | Status: DC

## 2015-07-19 MED ORDER — CYCLOBENZAPRINE HCL 10 MG PO TABS: 10.0000 mg | ORAL_TABLET | Freq: Three times a day (TID) | ORAL | Status: DC | PRN

## 2015-07-19 NOTE — Addendum Note (Signed)
Addended by: Scharlene GlossEWING, ROBIN B on: 07/19/2015 02:38 PM   Modules accepted: Orders

## 2015-08-13 ENCOUNTER — Other Ambulatory Visit: Payer: Self-pay | Admitting: Family Medicine

## 2015-08-14 ENCOUNTER — Telehealth: Payer: 59 | Admitting: Family

## 2015-08-14 DIAGNOSIS — A499 Bacterial infection, unspecified: Secondary | ICD-10-CM

## 2015-08-14 DIAGNOSIS — B9689 Other specified bacterial agents as the cause of diseases classified elsewhere: Secondary | ICD-10-CM

## 2015-08-14 DIAGNOSIS — J329 Chronic sinusitis, unspecified: Secondary | ICD-10-CM

## 2015-08-14 MED ORDER — AMOXICILLIN-POT CLAVULANATE 875-125 MG PO TABS: 1.0000 | ORAL_TABLET | Freq: Two times a day (BID) | ORAL | 0 refills | Status: DC

## 2015-08-14 NOTE — Progress Notes (Signed)

## 2015-08-21 ENCOUNTER — Ambulatory Visit (INDEPENDENT_AMBULATORY_CARE_PROVIDER_SITE_OTHER): Payer: Managed Care, Other (non HMO) | Admitting: Physician Assistant

## 2015-08-21 ENCOUNTER — Encounter: Payer: Self-pay | Admitting: Physician Assistant

## 2015-08-21 VITALS — BP 116/82 | HR 87 | Temp 98.9°F | Resp 16 | Ht 62.0 in | Wt 166.2 lb

## 2015-08-21 DIAGNOSIS — A499 Bacterial infection, unspecified: Secondary | ICD-10-CM | POA: Diagnosis not present

## 2015-08-21 DIAGNOSIS — B9689 Other specified bacterial agents as the cause of diseases classified elsewhere: Secondary | ICD-10-CM

## 2015-08-21 DIAGNOSIS — J329 Chronic sinusitis, unspecified: Secondary | ICD-10-CM

## 2015-08-21 MED ORDER — ALBUTEROL SULFATE (2.5 MG/3ML) 0.083% IN NEBU: 2.5000 mg | INHALATION_SOLUTION | Freq: Four times a day (QID) | RESPIRATORY_TRACT | 1 refills | Status: DC | PRN

## 2015-08-21 MED ORDER — METHYLPREDNISOLONE ACETATE 40 MG/ML IJ SUSP
40.0000 mg | Freq: Once | INTRAMUSCULAR | Status: AC
  Administered 2015-08-21: 40 mg via INTRAMUSCULAR

## 2015-08-21 MED ORDER — LEVOFLOXACIN 500 MG PO TABS: 500.0000 mg | ORAL_TABLET | Freq: Every day | ORAL | 0 refills | Status: DC

## 2015-08-21 NOTE — Progress Notes (Signed)
Patient presents to clinic today c/o continued sinus pressure, sinus pain, cough that is now productive of green sputum. Denies fever, chills, chest pain. Was recently given Augmentin and just completed a 1 week course without improvement. Symptoms have been present now for 3 weeks. Has noted wheezing increased from baseline asthma.  Past Medical History:  Diagnosis Date  . Abnormal cervical cytology 09/26/2011   LGSIL in 2011, patient reports repeat pap was normal, has had intermittent abnormals with bx in past but always normalized, has had cryotherapy with good results Menarche at 70, irregular without birth control at times, very heavy LMP 08/26/2011 No gyn concerns, MGM last year normal   . ADD (attention deficit disorder)   . Allergy    seasonal  . Anemia    blood loss  . Asthma    mild, intermittent  . Bronchitis, acute 12/16/2010  . Chest pain, atypical 10/08/2010  . Chicken pox as a child  . Concussion 09/24/2010  . Contraceptive management 02/17/2011  . Decreased libido 09/26/2011  . Depression 2002   post partum  . Family history of heart disease   . Female bladder prolapse, acquired 09/24/2010  . Fibromyalgia   . Headache 09/26/2013  . History of concussion 09/24/2010  . History of migraines 09/24/2010  . 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 HTN (hypertension) 09/24/2010  . Hyperlipidemia   . Hyperlipidemia   . Hypertension   . Interstitial cystitis 02/18/2011  . Lymphadenopathy 09/24/2010  . Migraine 09/24/2010  . Migraine aura, persistent, with cerebral infarct, status over 72 hours 09/24/2010  . Neck pain, musculoskeletal 12/16/2010  . Overweight 05/29/2015  . Overweight(278.02) 12/26/2011  . Poor concentration 12/16/2010  . Preventative health care 11/28/2012  . Recurrent UTI   . Renal lithiasis 09/24/2010  . RUQ pain 05/27/2011  . Subacute and chronic vaginitis 09/24/2010  . Thyroid disease   . Tick bite 12/04/2013  . Transient left leg weakness 05/21/2014  . Vaginitis and  vulvovaginitis 05/21/2014    Current Outpatient Prescriptions on File Prior to Visit  Medication Sig Dispense Refill  . acyclovir (ZOVIRAX) 200 MG capsule TAKE 1 CAPSULE (200 MG TOTAL) BY MOUTH 2 (TWO) TIMES DAILY. 180 capsule 3  . albuterol (PROVENTIL HFA;VENTOLIN HFA) 108 (90 Base) MCG/ACT inhaler Inhale 1 puff into the lungs every 4 (four) hours as needed for wheezing. 1-2 puffs po bid prn asthma, as directed by pulmonolgy 1 Inhaler 6  . amphetamine-dextroamphetamine (ADDERALL) 20 MG tablet Take 1 tablet (20 mg total) by mouth 2 (two) times daily. May  2017 60 tablet 0  . budesonide-formoterol (SYMBICORT) 160-4.5 MCG/ACT inhaler Inhale 2 puffs into the lungs 2 (two) times daily. 1 Inhaler 4  . cyclobenzaprine (FLEXERIL) 10 MG tablet Take 1 tablet (10 mg total) by mouth 3 (three) times daily as needed for muscle spasms. 30 tablet 2  . EPINEPHrine 0.3 mg/0.3 mL IJ SOAJ injection Inject 0.3 mLs (0.3 mg total) into the muscle once. 1 Device 2  . HYDROcodone-acetaminophen (VICODIN) 5-500 MG tablet Take 1 tablet by mouth every 8 (eight) hours as needed for pain. 120 tablet 0  . lidocaine (XYLOCAINE) 2 % jelly Apply to affected area daily prn 30 mL 1  . nebivolol (BYSTOLIC) 5 MG tablet Take 1 tablet (5 mg total) by mouth daily. As needed 90 tablet 2  . Norethindrone Acetate-Ethinyl Estrad-FE (BLISOVI 24 FE) 1-20 MG-MCG(24) tablet Take 1 tablet by mouth daily. 3 Package 3  . pentosan polysulfate (ELMIRON) 100 MG capsule Take 1 capsule (  100 mg total) by mouth 3 (three) times daily before meals. 90 capsule 5  . SUMAtriptan (IMITREX) 50 MG tablet Take 1 tablet (50 mg total) by mouth every 2 (two) hours as needed for migraine. May repeat in 2 hours if headache persists or recurs. 10 tablet 5  . SYNTHROID 137 MCG tablet Take 1 tablet (137 mcg total) by mouth daily. 90 tablet 1   No current facility-administered medications on file prior to visit.     Allergies  Allergen Reactions  . Peanut-Containing  Drug Products Anaphylaxis  . Terazol [Terconazole]     Burning sensation    Family History  Problem Relation Age of Onset  . Heart attack Father   . Hyperlipidemia Father   . Hypertension Father   . Alcohol abuse Father   . Heart disease Father   . Heart attack Brother 43  . Hypertension Brother   . Heart disease Brother 8    MI  . Hyperlipidemia Brother   . Dementia Maternal Grandfather 50    early stages  . Stroke Maternal Grandfather   . Heart attack Paternal Grandfather   . Stroke Paternal Grandfather   . Heart disease Paternal Grandfather   . Alcohol abuse Paternal Grandfather   . Hyperlipidemia Paternal Grandfather   . Hypertension Paternal Grandfather   . Osteoporosis Mother   . Hyperlipidemia Mother   . Heart disease Maternal Grandmother     Social History   Social History  . Marital status: Married    Spouse name: N/A  . Number of children: N/A  . Years of education: N/A   Social History Main Topics  . Smoking status: Never Smoker  . Smokeless tobacco: Never Used  . Alcohol use 0.0 oz/week     Comment: Rarely  . Drug use: No  . Sexual activity: Yes    Partners: Male    Birth control/ protection: Pill   Other Topics Concern  . None   Social History Narrative   Lives with husband 2 children in a one story home.     Works as a Geophysicist/field seismologist.     Education: college.    Review of Systems - See HPI.  All other ROS are negative.  BP 116/82 (BP Location: Left Arm, Patient Position: Sitting, Cuff Size: Normal)   Pulse 87   Temp 98.9 F (37.2 C) (Oral)   Resp 16   Ht _0  (1.575 m)   Wt 166 lb 4 oz (75.4 kg)   LMP 07/31/2015   SpO2 98%   BMI 30.41 kg/m   Physical Exam  Constitutional: She is oriented to person, place, and time and well-developed, well-nourished, and in no distress.  HENT:  Head: Normocephalic and atraumatic.  Right Ear: External ear normal.  Left Ear: External ear normal.  Nose: Nose normal.  Mouth/Throat: Oropharynx  is clear and moist. No oropharyngeal exudate.  + TTP to maxillary sinuses  Eyes: Conjunctivae are normal.  Neck: Neck supple.  Cardiovascular: Normal rate, regular rhythm, normal heart sounds and intact distal pulses.   Pulmonary/Chest: Effort normal and breath sounds normal. No respiratory distress. She has no wheezes. She has no rales. She exhibits no tenderness.  Neurological: She is alert and oriented to person, place, and time.  Skin: Skin is warm and dry. No rash noted.  Psychiatric: Affect normal.  Vitals reviewed.   Recent Results (from the past 2160 hour(s))  Protime-INR     Status: None   Collection Time: 05/23/15  6:35 PM  Result Value Ref Range   Prothrombin Time 14.3 11.6 - 15.2 seconds   INR 1.09 0.00 - 1.49  APTT     Status: None   Collection Time: 05/23/15  6:35 PM  Result Value Ref Range   aPTT 30 24 - 37 seconds  CBC     Status: None   Collection Time: 05/23/15  6:35 PM  Result Value Ref Range   WBC 10.3 4.0 - 10.5 K/uL   RBC 4.37 3.87 - 5.11 MIL/uL   Hemoglobin 13.1 12.0 - 15.0 g/dL   HCT 39.4 36.0 - 46.0 %   MCV 90.2 78.0 - 100.0 fL   MCH 30.0 26.0 - 34.0 pg   MCHC 33.2 30.0 - 36.0 g/dL   RDW 12.2 11.5 - 15.5 %   Platelets 223 150 - 400 K/uL  Differential     Status: Abnormal   Collection Time: 05/23/15  6:35 PM  Result Value Ref Range   Neutrophils Relative % 53 %   Neutro Abs 5.4 1.7 - 7.7 K/uL   Lymphocytes Relative 40 %   Lymphs Abs 4.1 (H) 0.7 - 4.0 K/uL   Monocytes Relative 6 %   Monocytes Absolute 0.7 0.1 - 1.0 K/uL   Eosinophils Relative 1 %   Eosinophils Absolute 0.1 0.0 - 0.7 K/uL   Basophils Relative 0 %   Basophils Absolute 0.0 0.0 - 0.1 K/uL  Comprehensive metabolic panel     Status: None   Collection Time: 05/23/15  6:35 PM  Result Value Ref Range   Sodium 136 135 - 145 mmol/L   Potassium 4.5 3.5 - 5.1 mmol/L   Chloride 102 101 - 111 mmol/L   CO2 23 22 - 32 mmol/L   Glucose, Bld 88 65 - 99 mg/dL   BUN 14 6 - 20 mg/dL    Creatinine, Ser 0.82 0.44 - 1.00 mg/dL   Calcium 9.4 8.9 - 10.3 mg/dL   Total Protein 7.0 6.5 - 8.1 g/dL   Albumin 3.6 3.5 - 5.0 g/dL   AST 19 15 - 41 U/L   ALT 16 14 - 54 U/L   Alkaline Phosphatase 45 38 - 126 U/L   Total Bilirubin 0.6 0.3 - 1.2 mg/dL   GFR calc non Af Amer >60 >60 mL/min   GFR calc Af Amer >60 >60 mL/min    Comment: (NOTE) The eGFR has been calculated using the CKD EPI equation. This calculation has not been validated in all clinical situations. eGFR's persistently <60 mL/min signify possible Chronic Kidney Disease.    Anion gap 11 5 - 15  CBG monitoring, ED     Status: None   Collection Time: 05/23/15  6:40 PM  Result Value Ref Range   Glucose-Capillary 74 65 - 99 mg/dL  I-Stat Beta hCG blood, ED ([facility], [facility], [facility] only)     Status: None   Collection Time: 05/23/15  6:45 PM  Result Value Ref Range   I-stat hCG, quantitative <5.0 <5 mIU/mL   Comment 3            Comment:   GEST. AGE      CONC.  (mIU/mL)   <=1 WEEK        5 - 50     2 WEEKS       50 - 500     3 WEEKS       100 - 10,000     4 WEEKS     1,000 - 30,000  FEMALE AND NON-PREGNANT FEMALE:     LESS THAN 5 mIU/mL   I-Stat Chem 8, ED     Status: None   Collection Time: 05/23/15  6:47 PM  Result Value Ref Range   Sodium 139 135 - 145 mmol/L   Potassium 4.5 3.5 - 5.1 mmol/L   Chloride 105 101 - 111 mmol/L   BUN 16 6 - 20 mg/dL   Creatinine, Ser 0.80 0.44 - 1.00 mg/dL   Glucose, Bld 83 65 - 99 mg/dL   Calcium, Ion 1.18 1.12 - 1.23 mmol/L   TCO2 24 0 - 100 mmol/L   Hemoglobin 14.3 12.0 - 15.0 g/dL   HCT 42.0 36.0 - 46.0 %  I-stat troponin, ED     Status: None   Collection Time: 05/23/15  6:51 PM  Result Value Ref Range   Troponin i, poc 0.00 0.00 - 0.08 ng/mL   Comment 3            Comment: Due to the release kinetics of cTnI, a negative result within the first hours of the onset of symptoms does not rule out myocardial infarction with certainty. If myocardial infarction is still  suspected, repeat the test at appropriate intervals.   CBC w/Diff     Status: None   Collection Time: 05/29/15 11:01 AM  Result Value Ref Range   WBC 9.6 4.0 - 10.5 K/uL   RBC 4.19 3.87 - 5.11 Mil/uL   Hemoglobin 12.7 12.0 - 15.0 g/dL   HCT 37.1 36.0 - 46.0 %   MCV 88.6 78.0 - 100.0 fl   MCHC 34.3 30.0 - 36.0 g/dL   RDW 12.4 11.5 - 15.5 %   Platelets 222.0 150.0 - 400.0 K/uL   Neutrophils Relative % 52.9 43.0 - 77.0 %   Lymphocytes Relative 40.2 12.0 - 46.0 %   Monocytes Relative 5.4 3.0 - 12.0 %   Eosinophils Relative 1.2 0.0 - 5.0 %   Basophils Relative 0.3 0.0 - 3.0 %   Neutro Abs 5.1 1.4 - 7.7 K/uL   Lymphs Abs 3.8 0.7 - 4.0 K/uL   Monocytes Absolute 0.5 0.1 - 1.0 K/uL   Eosinophils Absolute 0.1 0.0 - 0.7 K/uL   Basophils Absolute 0.0 0.0 - 0.1 K/uL  Estrogens, total     Status: None   Collection Time: 05/29/15 11:01 AM  Result Value Ref Range   Estrogen 141.9 pg/mL    Comment:   Reference Ranges for Total Estrogen:     Follicular Phase        (1-12 days):  90-590 pg/mL   Luteal Phase:     130-460 pg/mL   Postmenopausal:    50-170 pg/mL   The total estrogen assay is not recommended for use in pre-pubertal children.   Progesterone     Status: None   Collection Time: 05/29/15 11:01 AM  Result Value Ref Range   Progesterone <0.5 ng/mL    Comment: Reference Range Female  Follicular Phase <8.6 ng/mL  Luteal Phase 2.6-21.5 ng/mL  Postmenopausal  <0.5 ng/mL Pregnancy  First Trimester 4.1-34.0 ng/mL  Second Trimester 24.0-76.0 ng/mL  Third Trimester 52.0-302.0 ng/mL     Sedimentation rate     Status: None   Collection Time: 05/29/15 11:01 AM  Result Value Ref Range   Sed Rate 19 0 - 22 mm/hr  Lipid panel     Status: Abnormal   Collection Time: 05/29/15 11:01 AM  Result Value Ref Range   Cholesterol 251 (H) 0 - 200 mg/dL  Comment: ATP III Classification       Desirable:  < 200 mg/dL               Borderline High:  200 - 239 mg/dL          High:  > = 240  mg/dL   Triglycerides (H) 0.0 - 149.0 mg/dL    772.0 Triglyceride is over 400; calculations on Lipids are invalid.    Comment: Normal:  <150 mg/dLBorderline High:  150 - 199 mg/dL   HDL 25.70 (L) >39.00 mg/dL   Total CHOL/HDL Ratio 10     Comment:                Men          Women1/2 Average Risk     3.4          3.3Average Risk          5.0          4.42X Average Risk          9.6          7.13X Average Risk          15.0          11.0                      Comprehensive metabolic panel     Status: Abnormal   Collection Time: 05/29/15 11:01 AM  Result Value Ref Range   Sodium 134 (L) 135 - 145 mEq/L   Potassium 4.2 3.5 - 5.1 mEq/L   Chloride 102 96 - 112 mEq/L   CO2 25 19 - 32 mEq/L   Glucose, Bld 84 70 - 99 mg/dL   BUN 16 6 - 23 mg/dL   Creatinine, Ser 0.74 0.40 - 1.20 mg/dL   Total Bilirubin 0.3 0.2 - 1.2 mg/dL   Alkaline Phosphatase 46 39 - 117 U/L   AST 16 0 - 37 U/L   ALT 14 0 - 35 U/L   Total Protein 6.8 6.0 - 8.3 g/dL   Albumin 3.9 3.5 - 5.2 g/dL   Calcium 9.2 8.4 - 10.5 mg/dL   GFR 92.65 >60.00 mL/min  TSH     Status: Abnormal   Collection Time: 05/29/15 11:01 AM  Result Value Ref Range   TSH 0.32 (L) 0.35 - 4.50 uIU/mL  Testosterone, Total, LC/MS/MS     Status: None   Collection Time: 05/29/15 11:01 AM  Result Value Ref Range   Testosterone, Total, LC-MS-MS 8 2 - 45 ng/dL    Comment: For more information on this test, go to http://education.[facility].com/faq/ TotalTestosteroneLCMSMS This test was developed and its analytical performance characteristics have been determined by [place], [place]. It has not been cleared or approved by the U.S. Food and Drug Administration. This assay has been validated pursuant to the CLIA regulations and is used for clinical purposes.   LDL cholesterol, direct     Status: None   Collection Time: 05/29/15 11:01 AM  Result Value Ref Range   Direct LDL 66.0 mg/dL    Comment: Optimal:  <100  mg/dLNear or Above Optimal:  100-129 mg/dLBorderline High:  130-159 mg/dLHigh:  160-189 mg/dLVery High:  >190 mg/dL  Rocky mtn spotted fvr abs pnl(IgG+IgM)     Status: None   Collection Time: 05/29/15 11:01 AM  Result Value Ref Range   RMSF IgG NOT DETECTED NOT DETECTED   RMSF IgM NOT DETECTED NOT DETECTED  Comment: Cross-reactivity between the Spotted Fever and Typhus groups is minor. Significant cross reactivity within the Spotted Fever or Typhus group precludes speciation of the rickettsiae.   T4, free     Status: None   Collection Time: 05/31/15  2:08 PM  Result Value Ref Range   Free T4 0.88 0.60 - 1.60 ng/dL   Assessment/Plan: 1. Bacterial sinusitis Rx Levaquin 500 mg QD x 7 days. IM Depmedrol given. Continue asthma medications. Supportive measures discussed with patient.  - methylPREDNISolone acetate (DEPO-MEDROL) injection 40 mg; Inject 1 mL (40 mg total) into the muscle once.   Leeanne Rio, PA-C

## 2015-08-21 NOTE — Patient Instructions (Signed)
Please take antibiotic as directed.  Increase fluid intake.  Use Saline nasal spray.  Take a daily multivitamin. Continue respiratory medications as directed.  Place a humidifier in the bedroom.  Please call or return clinic if symptoms are not improving.  Sinusitis Sinusitis is redness, soreness, and swelling (inflammation) of the paranasal sinuses. Paranasal sinuses are air pockets within the bones of your face (beneath the eyes, the middle of the forehead, or above the eyes). In healthy paranasal sinuses, mucus is able to drain out, and air is able to circulate through them by way of your nose. However, when your paranasal sinuses are inflamed, mucus and air can become trapped. This can allow bacteria and other germs to grow and cause infection. Sinusitis can develop quickly and last only a short time (acute) or continue over a long period (chronic). Sinusitis that lasts for more than 12 weeks is considered chronic.  CAUSES  Causes of sinusitis include:  Allergies.  Structural abnormalities, such as displacement of the cartilage that separates your nostrils (deviated septum), which can decrease the air flow through your nose and sinuses and affect sinus drainage.  Functional abnormalities, such as when the small hairs (cilia) that line your sinuses and help remove mucus do not work properly or are not present. SYMPTOMS  Symptoms of acute and chronic sinusitis are the same. The primary symptoms are pain and pressure around the affected sinuses. Other symptoms include:  Upper toothache.  Earache.  Headache.  Bad breath.  Decreased sense of smell and taste.  A cough, which worsens when you are lying flat.  Fatigue.  Fever.  Thick drainage from your nose, which often is green and may contain pus (purulent).  Swelling and warmth over the affected sinuses. DIAGNOSIS  Your caregiver will perform a physical exam. During the exam, your caregiver may:  Look in your nose for signs of  abnormal growths in your nostrils (nasal polyps).  Tap over the affected sinus to check for signs of infection.  View the inside of your sinuses (endoscopy) with a special imaging device with a light attached (endoscope), which is inserted into your sinuses. If your caregiver suspects that you have chronic sinusitis, one or more of the following tests may be recommended:  Allergy tests.  Nasal culture A sample of mucus is taken from your nose and sent to a lab and screened for bacteria.  Nasal cytology A sample of mucus is taken from your nose and examined by your caregiver to determine if your sinusitis is related to an allergy. TREATMENT  Most cases of acute sinusitis are related to a viral infection and will resolve on their own within 10 days. Sometimes medicines are prescribed to help relieve symptoms (pain medicine, decongestants, nasal steroid sprays, or saline sprays).  However, for sinusitis related to a bacterial infection, your caregiver will prescribe antibiotic medicines. These are medicines that will help kill the bacteria causing the infection.  Rarely, sinusitis is caused by a fungal infection. In theses cases, your caregiver will prescribe antifungal medicine. For some cases of chronic sinusitis, surgery is needed. Generally, these are cases in which sinusitis recurs more than 3 times per year, despite other treatments. HOME CARE INSTRUCTIONS   Drink plenty of water. Water helps thin the mucus so your sinuses can drain more easily.  Use a humidifier.  Inhale steam 3 to 4 times a day (for example, sit in the bathroom with the shower running).  Apply a warm, moist washcloth to your face 3  to 4 times a day, or as directed by your caregiver.  Use saline nasal sprays to help moisten and clean your sinuses.  Take over-the-counter or prescription medicines for pain, discomfort, or fever only as directed by your caregiver. SEEK IMMEDIATE MEDICAL CARE IF:  You have increasing  pain or severe headaches.  You have nausea, vomiting, or drowsiness.  You have swelling around your face.  You have vision problems.  You have a stiff neck.  You have difficulty breathing. MAKE SURE YOU:   Understand these instructions.  Will watch your condition.  Will get help right away if you are not doing well or get worse. Document Released: 12/30/2004 Document Revised: 03/24/2011 Document Reviewed: 01/14/2011 [employer] Patient Information 2014 [employer].   

## 2015-08-28 ENCOUNTER — Encounter: Payer: Self-pay | Admitting: Family Medicine

## 2015-08-30 ENCOUNTER — Other Ambulatory Visit: Payer: Self-pay | Admitting: Family Medicine

## 2015-08-30 MED ORDER — LEVOFLOXACIN 500 MG PO TABS: 500.0000 mg | ORAL_TABLET | Freq: Every day | ORAL | 0 refills | Status: DC

## 2015-09-24 ENCOUNTER — Encounter: Payer: Self-pay | Admitting: Neurology

## 2015-09-24 ENCOUNTER — Ambulatory Visit (INDEPENDENT_AMBULATORY_CARE_PROVIDER_SITE_OTHER): Payer: Managed Care, Other (non HMO) | Admitting: Neurology

## 2015-09-24 VITALS — BP 120/88 | HR 83 | Ht 62.0 in | Wt 169.1 lb

## 2015-09-24 DIAGNOSIS — G44221 Chronic tension-type headache, intractable: Secondary | ICD-10-CM

## 2015-09-24 MED ORDER — CYCLOBENZAPRINE HCL 10 MG PO TABS: 10.0000 mg | ORAL_TABLET | Freq: Every day | ORAL | 3 refills | Status: DC

## 2015-09-24 NOTE — Patient Instructions (Addendum)
Continue flexeril 10mg  daily; you can reduce the dose to 5mg  at bedtime if you continue to do well Return to clinic as needed

## 2015-09-24 NOTE — Progress Notes (Signed)
Follow-up Visit   Date: 09/24/15    Kristen [name] MRN: [id number] DOB: [birth year]   Interim History: Kristen [name] is a 40 y.o. right-handed Caucasian female with ADHD, hypothyroidism, fibromyalgia, and asthma returning to the clinic for follow-up of migraines.  The patient was accompanied to the clinic by self.  History of present illness: She presented to the ER on 5/10 with right sided numbness starting in the jaw and radiating into the face and shoulder which self-resolved within 2 hours.  She also felt that the right side of the face was not moving, so went to the ER.  Following improvement of the symptoms, she had onset of a photophobic headache and severe jaw pain. Because of her lateralizing symptoms and vascular risk factors, MRI brain was performed which did not show any acute process.  She was treated for complex migraine with compazine, benadryl, and toradol which helped with her arm paresthesias, but she continues to have jaw tightness for about another week.  She reports having a similar spell in 2015 occuring on the left side.    She developed migraines in 8th grade.  She was seen by Dr. Sharene Skeans and started on preventative medication, but does not recall the name.  Since the age of 65, her headaches spontaneously resolved after she became pregnant with her first child.  She has only had 3 migraines since this this time.  Migraines are associated with nausea, photophobia, phonophobia.    She continues to feel tired all time time and heaviness of her arms and complains of joint pain involving the elbow and hands.   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  She complains of dropping things especially when cooking and feels clumsy.  She denies numbness or tingling.    Since her ER visit, she complains of daily headache which is band-like, described as a dull ache. Pain is ranked as 2-3/10. She takes excedrin 3-4 times per week for the past month.     UPDATE 09/24/2015:  She is doing much better since taking flexeril 10mg  nightly and reports being headache-free. She only takes ibuprofen rarely now. She had not had any further spells of right sided paresthesias of the face or limbs.  She occasionally has neck stiffness in the afternoon and feels that it may be due to her thyroid medication fluctuating.  I explained that it is usually a medication dosed daily.   Since her blood pressure medication was discontinued, her energy and fatigue issues have resolved.    Medications:  Current Outpatient Prescriptions on File Prior to Visit  Medication Sig Dispense Refill  . acyclovir (ZOVIRAX) 200 MG capsule TAKE 1 CAPSULE (200 MG TOTAL) BY MOUTH 2 (TWO) TIMES DAILY. 180 capsule 3  . albuterol (PROVENTIL HFA;VENTOLIN HFA) 108 (90 Base) MCG/ACT inhaler Inhale 1 puff into the lungs every 4 (four) hours as needed for wheezing. 1-2 puffs po bid prn asthma, as directed by pulmonolgy 1 Inhaler 6  . albuterol (PROVENTIL) (2.5 MG/3ML) 0.083% nebulizer solution Take 3 mLs (2.5 mg total) by nebulization every 6 (six) hours as needed for wheezing or shortness of breath. 150 mL 1  . amphetamine-dextroamphetamine (ADDERALL) 20 MG tablet Take 1 tablet (20 mg total) by mouth 2 (two) times daily. May  2017 60 tablet 0  . budesonide-formoterol (SYMBICORT) 160-4.5 MCG/ACT inhaler Inhale 2 puffs into the lungs 2 (two) times daily. 1 Inhaler 4  . EPINEPHrine 0.3 mg/0.3 mL IJ  SOAJ injection Inject 0.3 mLs (0.3 mg total) into the muscle once. 1 Device 2  . HYDROcodone-acetaminophen (VICODIN) 5-500 MG tablet Take 1 tablet by mouth every 8 (eight) hours as needed for pain. 120 tablet 0  . levofloxacin (LEVAQUIN) 500 MG tablet Take 1 tablet (500 mg total) by mouth daily. 7 tablet 0  . lidocaine (XYLOCAINE) 2 % jelly Apply to affected area daily prn 30 mL 1  . nebivolol (BYSTOLIC) 5 MG tablet Take 1 tablet (5 mg total) by mouth daily. As needed 90 tablet 2  . Norethindrone  Acetate-Ethinyl Estrad-FE (BLISOVI 24 FE) 1-20 MG-MCG(24) tablet Take 1 tablet by mouth daily. 3 Package 3  . pentosan polysulfate (ELMIRON) 100 MG capsule Take 1 capsule (100 mg total) by mouth 3 (three) times daily before meals. 90 capsule 5  . SUMAtriptan (IMITREX) 50 MG tablet Take 1 tablet (50 mg total) by mouth every 2 (two) hours as needed for migraine. May repeat in 2 hours if headache persists or recurs. 10 tablet 5  . SYNTHROID 137 MCG tablet Take 1 tablet (137 mcg total) by mouth daily. 90 tablet 1   No current facility-administered medications on file prior to visit.     Allergies:  Allergies  Allergen Reactions  . Peanut-Containing Drug Products Anaphylaxis  . Terazol [Terconazole]     Burning sensation    Review of Systems:  CONSTITUTIONAL: No fevers, chills, night sweats, or weight loss.  EYES: No visual changes or eye pain ENT: No hearing changes.  No history of nose bleeds.   RESPIRATORY: No cough, wheezing and shortness of breath.   CARDIOVASCULAR: Negative for chest pain, and palpitations.   GI: Negative for abdominal discomfort, blood in stools or black stools.  No recent change in bowel habits.   GU:  No history of incontinence.   MUSCLOSKELETAL: No history of joint pain or swelling.  No myalgias.   SKIN: Negative for lesions, rash, and itching.   ENDOCRINE: Negative for cold or heat intolerance, polydipsia or goiter.   PSYCH:  No depression or anxiety symptoms.   NEURO: As Above.   Vital Signs:  BP 120/88   Pulse 83   Ht 5\' 2"  (1.575 m)   Wt 169 lb 1 oz (76.7 kg)   SpO2 98%   BMI 30.92 kg/m   Neurological Exam: MENTAL STATUS including orientation to time, place, person, recent and remote memory, attention span and concentration, language, and fund of knowledge is normal.  Speech is not dysarthric.  CRANIAL NERVES:  Face is symmetric.  MOTOR:  Motor strength is 5/5 in all extremities.  COORDINATION/GAIT:   Gait narrow based and stable.    Data: MRI brain wo contrast 05/23/2015:  Normal  Lab Results  Component Value Date   TSH 0.32 (L) 05/29/2015   Lab Results  Component Value Date   VITAMINB12 358 05/11/2014     IMPRESSION/PLAN: 1.  Episodic complex migraine manifesting with right sensory changes and facial weakness, jaw pain  - MRI brain and US carotids is normal  - No relief with Maxalt  - She has Imitrex 50mg  for severe headaches, but has not needed to use this  2.  Chronic tension headache, improved with muscle relaxants  - Continue flexeril 10mg  at bedtime, she may try to reduce this to 5mg  (half-tablet) and see if she can get the same benefit  - Avoid topiramate due to history of kidney stones  3.  Medication overuse headaches - resolved  4.  Generalized fatigue  resolved after discontinuation of anti-hypertensive medications  5.  Hypothyroidism, requesting to see endocrinology and I asked her to discuss this with her PCP  Return to clinic as needed   The duration of this appointment visit was 20 minutes of face-to-face time with the patient.  Greater than 50% of this time was spent in counseling, explanation of diagnosis, planning of further management, and coordination of care.   Thank you for allowing me to participate in patient's care.  If I can answer any additional questions, I would be pleased to do so.    Sincerely,    Ovide Dusek K. Allena Katz, DO

## 2015-10-16 ENCOUNTER — Other Ambulatory Visit: Payer: Self-pay | Admitting: Family Medicine

## 2015-10-16 MED ORDER — BUDESONIDE-FORMOTEROL FUMARATE 160-4.5 MCG/ACT IN AERO: 2.0000 | INHALATION_SPRAY | Freq: Two times a day (BID) | RESPIRATORY_TRACT | 4 refills | Status: DC

## 2015-10-23 ENCOUNTER — Other Ambulatory Visit: Payer: Self-pay | Admitting: Family Medicine

## 2015-10-23 DIAGNOSIS — B009 Herpesviral infection, unspecified: Secondary | ICD-10-CM

## 2015-11-29 ENCOUNTER — Ambulatory Visit (INDEPENDENT_AMBULATORY_CARE_PROVIDER_SITE_OTHER): Payer: Managed Care, Other (non HMO) | Admitting: Family Medicine

## 2015-11-29 DIAGNOSIS — E039 Hypothyroidism, unspecified: Secondary | ICD-10-CM | POA: Diagnosis not present

## 2015-11-29 DIAGNOSIS — E782 Mixed hyperlipidemia: Secondary | ICD-10-CM | POA: Diagnosis not present

## 2015-11-29 DIAGNOSIS — B009 Herpesviral infection, unspecified: Secondary | ICD-10-CM | POA: Diagnosis not present

## 2015-11-29 DIAGNOSIS — R51 Headache: Secondary | ICD-10-CM

## 2015-11-29 DIAGNOSIS — R519 Headache, unspecified: Secondary | ICD-10-CM

## 2015-11-29 DIAGNOSIS — I10 Essential (primary) hypertension: Secondary | ICD-10-CM

## 2015-11-29 DIAGNOSIS — E663 Overweight: Secondary | ICD-10-CM

## 2015-11-29 MED ORDER — AMPHETAMINE-DEXTROAMPHETAMINE 20 MG PO TABS: 20.0000 mg | ORAL_TABLET | Freq: Two times a day (BID) | ORAL | 0 refills | Status: DC

## 2015-11-29 MED ORDER — SYNTHROID 137 MCG PO TABS: 137.0000 ug | ORAL_TABLET | Freq: Every day | ORAL | 1 refills | Status: DC

## 2015-11-29 MED ORDER — ACYCLOVIR 200 MG PO CAPS: ORAL_CAPSULE | ORAL | 3 refills | Status: DC

## 2015-11-29 MED ORDER — ENALAPRIL MALEATE 5 MG PO TABS: 5.0000 mg | ORAL_TABLET | Freq: Every day | ORAL | 1 refills | Status: DC

## 2015-11-29 NOTE — Assessment & Plan Note (Addendum)
Patient BP well controlled but patient concerned med is contributing to weight gain and fatigue. Had dropped the Bystolic to 1/2 tab. She wants to switch to Enalapril which her uncle takes. 5 mg daily

## 2015-11-29 NOTE — Progress Notes (Signed)
Patient ID: Kristen [name], female   DOB: 20-Dec-1975, 40 y.o.   MRN: [id number]   Subjective:    Patient ID: Kristen [name], female    DOB: 1975/08/25, 76 y.o.   MRN: [id number]  Chief Complaint  Patient presents with  . Follow-up    HPI Patient is in today for follow up pt c/o weight gain. She feels it is related to her blood pressure medications and would like to change meds. She also notes fatigue and malaise. Denies CP/palp/SOB/HA/congestion/fevers/GI or GU c/o. Taking meds as prescribed  Past Medical History:  Diagnosis Date  . Abnormal cervical cytology 09/26/2011   LGSIL in 2011, patient reports repeat pap was normal, has had intermittent abnormals with bx in past but always normalized, has had cryotherapy with good results Menarche at 17, irregular without birth control at times, very heavy LMP 08/26/2011 No gyn concerns, MGM last year normal   . ADD (attention deficit disorder)   . Allergy    seasonal  . Anemia    blood loss  . Asthma    mild, intermittent  . Bronchitis, acute 12/16/2010  . Chest pain, atypical 10/08/2010  . Chicken pox as a child  . Concussion 09/24/2010  . Contraceptive management 02/17/2011  . Decreased libido 09/26/2011  . Depression 2002   post partum  . Family history of heart disease   . Female bladder prolapse, acquired 09/24/2010  . Fibromyalgia   . Headache 09/26/2013  . History of concussion 09/24/2010  . History of migraines 09/24/2010  . 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 HTN (hypertension) 09/24/2010  . Hyperlipidemia   . Hyperlipidemia   . Hypertension   . Interstitial cystitis 02/18/2011  . Lymphadenopathy 09/24/2010  . Migraine 09/24/2010  . Migraine aura, persistent, with cerebral infarct, status over 72 hours 09/24/2010  . Neck pain, musculoskeletal 12/16/2010  . Overweight 05/29/2015  . Overweight(278.02) 12/26/2011  . Poor concentration 12/16/2010  . Preventative health care 11/28/2012  . Recurrent UTI   . Renal lithiasis 09/24/2010  . RUQ pain 05/27/2011   . Subacute and chronic vaginitis 09/24/2010  . Thyroid disease   . Tick bite 12/04/2013  . Transient left leg weakness 05/21/2014  . Vaginitis and vulvovaginitis 05/21/2014    Past Surgical History:  Procedure Laterality Date  . INCONTINENCE SURGERY  2003    Family History  Problem Relation Age of Onset  . Heart attack Father   . Hyperlipidemia Father   . Hypertension Father   . Alcohol abuse Father   . Heart disease Father   . Heart attack Brother 45  . Hypertension Brother   . Heart disease Brother 45    MI  . Hyperlipidemia Brother   . Dementia Maternal Grandfather 75    early stages  . Stroke Maternal Grandfather   . Heart attack Paternal Grandfather   . Stroke Paternal Grandfather   . Heart disease Paternal Grandfather   . Alcohol abuse Paternal Grandfather   . Hyperlipidemia Paternal Grandfather   . Hypertension Paternal Grandfather   . Osteoporosis Mother   . Hyperlipidemia Mother   . Heart disease Maternal Grandmother     Social History   Social History  . Marital status: Married    Spouse name: N/A  . Number of children: N/A  . Years of education: N/A   Occupational History  . Not on file.   Social History Main Topics  . Smoking status: Never Smoker  . Smokeless tobacco: Never Used  . Alcohol use 0.0 oz/week  Comment: Rarely  . Drug use: No  . Sexual activity: Yes    Partners: Male    Birth control/ protection: Pill   Other Topics Concern  . Not on file   Social History Narrative   Lives with husband 2 children in a one story home.     Works as a Teacher, adult education.     Education: college.    Outpatient Medications Prior to Visit  Medication Sig Dispense Refill  . albuterol (PROVENTIL HFA;VENTOLIN HFA) 108 (90 Base) MCG/ACT inhaler Inhale 1 puff into the lungs every 4 (four) hours as needed for wheezing. 1-2 puffs po bid prn asthma, as directed by pulmonolgy 1 Inhaler 6  . albuterol (PROVENTIL) (2.5 MG/3ML) 0.083% nebulizer solution Take  3 mLs (2.5 mg total) by nebulization every 6 (six) hours as needed for wheezing or shortness of breath. 150 mL 1  . budesonide-formoterol (SYMBICORT) 160-4.5 MCG/ACT inhaler Inhale 2 puffs into the lungs 2 (two) times daily. 1 Inhaler 4  . cyclobenzaprine (FLEXERIL) 10 MG tablet Take 1 tablet (10 mg total) by mouth at bedtime. 90 tablet 3  . EPINEPHrine 0.3 mg/0.3 mL IJ SOAJ injection Inject 0.3 mLs (0.3 mg total) into the muscle once. 1 Device 2  . HYDROcodone-acetaminophen (VICODIN) 5-500 MG tablet Take 1 tablet by mouth every 8 (eight) hours as needed for pain. 120 tablet 0  . levofloxacin (LEVAQUIN) 500 MG tablet Take 1 tablet (500 mg total) by mouth daily. 7 tablet 0  . lidocaine (XYLOCAINE) 2 % jelly APPLY TO AFFECTED AREA DAILY AS NEEDED 20 mL 1  . nebivolol (BYSTOLIC) 5 MG tablet Take 1 tablet (5 mg total) by mouth daily. As needed 90 tablet 2  . Norethindrone Acetate-Ethinyl Estrad-FE (BLISOVI 24 FE) 1-20 MG-MCG(24) tablet Take 1 tablet by mouth daily. 3 Package 3  . pentosan polysulfate (ELMIRON) 100 MG capsule Take 1 capsule (100 mg total) by mouth 3 (three) times daily before meals. 90 capsule 5  . SUMAtriptan (IMITREX) 50 MG tablet Take 1 tablet (50 mg total) by mouth every 2 (two) hours as needed for migraine. May repeat in 2 hours if headache persists or recurs. 10 tablet 5  . acyclovir (ZOVIRAX) 200 MG capsule TAKE 1 CAPSULE (200 MG TOTAL) BY MOUTH 2 (TWO) TIMES DAILY. 180 capsule 3  . amphetamine-dextroamphetamine (ADDERALL) 20 MG tablet Take 1 tablet (20 mg total) by mouth 2 (two) times daily. May  2017 60 tablet 0  . SYNTHROID 137 MCG tablet Take 1 tablet (137 mcg total) by mouth daily. 90 tablet 1   No facility-administered medications prior to visit.     Allergies  Allergen Reactions  . Peanut-Containing Drug Products Anaphylaxis  . Terazol [Terconazole]     Burning sensation    Review of Systems  Constitutional: Positive for malaise/fatigue. Negative for fever.  Eyes:  Negative for blurred vision.  Respiratory: Negative for cough and shortness of breath.   Cardiovascular: Negative for chest pain and palpitations.  Gastrointestinal: Negative for vomiting.  Musculoskeletal: Negative for back pain.  Skin: Negative for rash.  Neurological: Positive for headaches. Negative for loss of consciousness.       Objective:    Physical Exam  Constitutional: She is oriented to person, place, and time. She appears well-developed and well-nourished. No distress.  HENT:  Head: Normocephalic and atraumatic.  Eyes: Conjunctivae are normal.  Neck: Normal range of motion. No thyromegaly present.  Cardiovascular: Normal rate and regular rhythm.   Pulmonary/Chest: Effort normal and breath sounds  normal. She has no wheezes.  Abdominal: Soft. Bowel sounds are normal. There is no tenderness.  Musculoskeletal: Normal range of motion. She exhibits no edema or deformity.  Neurological: She is alert and oriented to person, place, and time.  Skin: Skin is warm and dry. She is not diaphoretic.  Psychiatric: She has a normal mood and affect.    BP 112/80 (BP Location: Left Arm, Patient Position: Sitting, Cuff Size: Normal)   Pulse 64   Temp 98.6 F (37 C) (Oral)   Ht 5\' 2"  (1.575 m)   Wt 172 lb 12.8 oz (78.4 kg)   LMP 10/25/2015 (Approximate)   SpO2 97%   BMI 31.61 kg/m  Wt Readings from Last 3 Encounters:  11/29/15 172 lb 12.8 oz (78.4 kg)  09/24/15 169 lb 1 oz (76.7 kg)  08/21/15 166 lb 4 oz (75.4 kg)     Lab Results  Component Value Date   WBC 9.6 05/29/2015   HGB 12.7 05/29/2015   HCT 37.1 05/29/2015   PLT 222.0 05/29/2015   GLUCOSE 84 05/29/2015   CHOL 251 (H) 05/29/2015   TRIG (H) 05/29/2015    772.0 Triglyceride is over 400; calculations on Lipids are invalid.   HDL 25.70 (L) 05/29/2015   LDLDIRECT 66.0 05/29/2015   LDLCALC 136 (H) 11/22/2012   ALT 14 05/29/2015   AST 16 05/29/2015   NA 134 (L) 05/29/2015   K 4.2 05/29/2015   CL 102 05/29/2015    CREATININE 0.74 05/29/2015   BUN 16 05/29/2015   CO2 25 05/29/2015   TSH 0.32 (L) 05/29/2015   INR 1.09 05/23/2015    Lab Results  Component Value Date   TSH 0.32 (L) 05/29/2015   Lab Results  Component Value Date   WBC 9.6 05/29/2015   HGB 12.7 05/29/2015   HCT 37.1 05/29/2015   MCV 88.6 05/29/2015   PLT 222.0 05/29/2015   Lab Results  Component Value Date   NA 134 (L) 05/29/2015   K 4.2 05/29/2015   CO2 25 05/29/2015   GLUCOSE 84 05/29/2015   BUN 16 05/29/2015   CREATININE 0.74 05/29/2015   BILITOT 0.3 05/29/2015   ALKPHOS 46 05/29/2015   AST 16 05/29/2015   ALT 14 05/29/2015   PROT 6.8 05/29/2015   ALBUMIN 3.9 05/29/2015   CALCIUM 9.2 05/29/2015   ANIONGAP 11 05/23/2015   GFR 92.65 05/29/2015   Lab Results  Component Value Date   CHOL 251 (H) 05/29/2015   Lab Results  Component Value Date   HDL 25.70 (L) 05/29/2015   Lab Results  Component Value Date   LDLCALC 136 (H) 11/22/2012   Lab Results  Component Value Date   TRIG (H) 05/29/2015    772.0 Triglyceride is over 400; calculations on Lipids are invalid.   Lab Results  Component Value Date   CHOLHDL 10 05/29/2015   No results found for: HGBA1C     Assessment & Plan:   Problem List Items Addressed This Visit    Hyperlipidemia    Encouraged heart healthy diet, increase exercise, avoid trans fats, consider a krill oil cap daily      Relevant Medications   enalapril (VASOTEC) 5 MG tablet   HTN (hypertension)    Patient BP well controlled but patient concerned med is contributing to weight gain and fatigue. Had dropped the Bystolic to 1/2 tab. She wants to switch to Enalapril which her uncle takes. 5 mg daily      Relevant Medications   enalapril (  VASOTEC) 5 MG tablet   Other Relevant Orders   CBC   Comprehensive metabolic panel   Lipid panel   Hypothyroid    On Levothyroxine, continue to monitor      Relevant Medications   SYNTHROID 137 MCG tablet   Headache    Controlled with qhs  muscle relaxer but returns when she stops. May try to use them as needed      Overweight    Encouraged DASH diet, decrease po intake and increase exercise as tolerated. Needs 7-8 hours of sleep nightly. Avoid trans fats, eat small, frequent meals every 4-5 hours with lean proteins, complex carbs and healthy fats. Minimize simple carbs       Other Visit Diagnoses    HSV infection       Relevant Medications   SYNTHROID 137 MCG tablet   acyclovir (ZOVIRAX) 200 MG capsule   amphetamine-dextroamphetamine (ADDERALL) 20 MG tablet   amphetamine-dextroamphetamine (ADDERALL) 20 MG tablet      I have discontinued Ms. Rohner's amphetamine-dextroamphetamine. I have also changed her amphetamine-dextroamphetamine and amphetamine-dextroamphetamine. Additionally, I am having her start on enalapril. Lastly, I am having her maintain her pentosan polysulfate, nebivolol, albuterol, HYDROcodone-acetaminophen, EPINEPHrine, SUMAtriptan, Norethindrone Acetate-Ethinyl Estrad-FE, albuterol, levofloxacin, cyclobenzaprine, budesonide-formoterol, lidocaine, SYNTHROID, and acyclovir.  Meds ordered this encounter  Medications  . DISCONTD: amphetamine-dextroamphetamine (ADDERALL) 20 MG tablet    Sig: Take 1 tablet (20 mg total) by mouth 2 (two) times daily. December  2017    Dispense:  60 tablet    Refill:  0  . SYNTHROID 137 MCG tablet    Sig: Take 1 tablet (137 mcg total) by mouth daily.    Dispense:  90 tablet    Refill:  1  . acyclovir (ZOVIRAX) 200 MG capsule    Sig: TAKE 1 CAPSULE (200 MG TOTAL) BY MOUTH 2 (TWO) TIMES DAILY.    Dispense:  180 capsule    Refill:  3  . amphetamine-dextroamphetamine (ADDERALL) 20 MG tablet    Sig: Take 1 tablet (20 mg total) by mouth 2 (two) times daily. January 2018    Dispense:  60 tablet    Refill:  0  . amphetamine-dextroamphetamine (ADDERALL) 20 MG tablet    Sig: Take 1 tablet (20 mg total) by mouth 2 (two) times daily. February 2018    Dispense:  60 tablet    Refill:   0  . enalapril (VASOTEC) 5 MG tablet    Sig: Take 1 tablet (5 mg total) by mouth daily.    Dispense:  30 tablet    Refill:  1   CMA served as scribe during the visit and her work is reviewed and attested to.   Danise EdgeBLYTH, Siris Hoos, MD

## 2015-11-29 NOTE — Assessment & Plan Note (Signed)
Encouraged heart healthy diet, increase exercise, avoid trans fats, consider a krill oil cap daily 

## 2015-11-29 NOTE — Progress Notes (Signed)
Pre visit review using our clinic review tool, if applicable. No additional management support is needed unless otherwise documented below in the visit note. 

## 2015-11-29 NOTE — Assessment & Plan Note (Addendum)
Controlled with qhs muscle relaxer but returns when she stops. May try to use them as needed

## 2015-11-29 NOTE — Patient Instructions (Addendum)
Hypertension Hypertension, commonly called high blood pressure, is when the force of blood pumping through your arteries is too strong. Your arteries are the blood vessels that carry blood from your heart throughout your body. A blood pressure reading consists of a higher number over a lower number, such as 110/72. The higher number (systolic) is the pressure inside your arteries when your heart pumps. The lower number (diastolic) is the pressure inside your arteries when your heart relaxes. Ideally you want your blood pressure below 120/80. Hypertension forces your heart to work harder to pump blood. Your arteries may become narrow or stiff. Having untreated or uncontrolled hypertension can cause heart attack, stroke, kidney disease, and other problems. What increases the risk? Some risk factors for high blood pressure are controllable. Others are not. Risk factors you cannot control include:  Race. You may be at higher risk if you are African American.  Age. Risk increases with age.  Gender. Men are at higher risk than women before age 45 years. After age 65, women are at higher risk than men. Risk factors you can control include:  Not getting enough exercise or physical activity.  Being overweight.  Getting too much fat, sugar, calories, or salt in your diet.  Drinking too much alcohol. What are the signs or symptoms? Hypertension does not usually cause signs or symptoms. Extremely high blood pressure (hypertensive crisis) may cause headache, anxiety, shortness of breath, and nosebleed. How is this diagnosed? To check if you have hypertension, your health care provider will measure your blood pressure while you are seated, with your arm held at the level of your heart. It should be measured at least twice using the same arm. Certain conditions can cause a difference in blood pressure between your right and left arms. A blood pressure reading that is higher than normal on one occasion does  not mean that you need treatment. If it is not clear whether you have high blood pressure, you may be asked to return on a different day to have your blood pressure checked again. Or, you may be asked to monitor your blood pressure at home for 1 or more weeks. How is this treated? Treating high blood pressure includes making lifestyle changes and possibly taking medicine. Living a healthy lifestyle can help lower high blood pressure. You may need to change some of your habits. Lifestyle changes may include:  Following the DASH diet. This diet is high in fruits, vegetables, and whole grains. It is low in salt, red meat, and added sugars.  Keep your sodium intake below 2,300 mg per day.  Getting at least 30-45 minutes of aerobic exercise at least 4 times per week.  Losing weight if necessary.  Not smoking.  Limiting alcoholic beverages.  Learning ways to reduce stress. Your health care provider may prescribe medicine if lifestyle changes are not enough to get your blood pressure under control, and if one of the following is true:  You are 18-59 years of age and your systolic blood pressure is above 140.  You are 60 years of age or older, and your systolic blood pressure is above 150.  Your diastolic blood pressure is above 90.  You have diabetes, and your systolic blood pressure is over 140 or your diastolic blood pressure is over 90.  You have kidney disease and your blood pressure is above 140/90.  You have heart disease and your blood pressure is above 140/90. Your personal target blood pressure may vary depending on your medical   conditions, your age, and other factors. Follow these instructions at home:  Have your blood pressure rechecked as directed by your health care provider.  Take medicines only as directed by your health care provider. Follow the directions carefully. Blood pressure medicines must be taken as prescribed. The medicine does not work as well when you skip  doses. Skipping doses also puts you at risk for problems.  Do not smoke.  Monitor your blood pressure at home as directed by your health care provider. Contact a health care provider if:  You think you are having a reaction to medicines taken.  You have recurrent headaches or feel dizzy.  You have swelling in your ankles.  You have trouble with your vision. Get help right away if:  You develop a severe headache or confusion.  You have unusual weakness, numbness, or feel faint.  You have severe chest or abdominal pain.  You vomit repeatedly.  You have trouble breathing. This information is not intended to replace advice given to you by your health care provider. Make sure you discuss any questions you have with your health care provider. Document Released: 12/30/2004 Document Revised: 06/07/2015 Document Reviewed: 10/22/2012 Elsevier Interactive Patient Education  2017 Elsevier Inc. DASH Eating Plan DASH stands for "Dietary Approaches to Stop Hypertension." The DASH eating plan is a healthy eating plan that has been shown to reduce high blood pressure (hypertension). Additional health benefits may include reducing the risk of type 2 diabetes mellitus, heart disease, and stroke. The DASH eating plan may also help with weight loss. What do I need to know about the DASH eating plan? For the DASH eating plan, you will follow these general guidelines:  Choose foods with less than 150 milligrams of sodium per serving (as listed on the food label).  Use salt-free seasonings or herbs instead of table salt or sea salt.  Check with your health care provider or pharmacist before using salt substitutes.  Eat lower-sodium products. These are often labeled as "low-sodium" or "no salt added."  Eat fresh foods. Avoid eating a lot of canned foods.  Eat more vegetables, fruits, and low-fat dairy products.  Choose whole grains. Look for the word "whole" as the first word in the ingredient  list.  Choose fish and skinless chicken or turkey more often than red meat. Limit fish, poultry, and meat to 6 oz (170 g) each day.  Limit sweets, desserts, sugars, and sugary drinks.  Choose heart-healthy fats.  Eat more home-cooked food and less restaurant, buffet, and fast food.  Limit fried foods.  Do not fry foods. Cook foods using methods such as baking, boiling, grilling, and broiling instead.  When eating at a restaurant, ask that your food be prepared with less salt, or no salt if possible. What foods can I eat? Seek help from a dietitian for individual calorie needs. Grains  Whole grain or whole wheat bread. Brown rice. Whole grain or whole wheat pasta. Quinoa, bulgur, and whole grain cereals. Low-sodium cereals. Corn or whole wheat flour tortillas. Whole grain cornbread. Whole grain crackers. Low-sodium crackers. Vegetables  Fresh or frozen vegetables (raw, steamed, roasted, or grilled). Low-sodium or reduced-sodium tomato and vegetable juices. Low-sodium or reduced-sodium tomato sauce and paste. Low-sodium or reduced-sodium canned vegetables. Fruits  All fresh, canned (in natural juice), or frozen fruits. Meat and Other Protein Products  Ground beef (85% or leaner), grass-fed beef, or beef trimmed of fat. Skinless chicken or turkey. Ground chicken or turkey. Pork trimmed of fat. All fish   and seafood. Eggs. Dried beans, peas, or lentils. Unsalted nuts and seeds. Unsalted canned beans. Dairy  Low-fat dairy products, such as skim or 1% milk, 2% or reduced-fat cheeses, low-fat ricotta or cottage cheese, or plain low-fat yogurt. Low-sodium or reduced-sodium cheeses. Fats and Oils  Tub margarines without trans fats. Light or reduced-fat mayonnaise and salad dressings (reduced sodium). Avocado. Safflower, olive, or canola oils. Natural peanut or almond butter. Other  Unsalted popcorn and pretzels. The items listed above may not be a complete list of recommended foods or beverages.  Contact your dietitian for more options.  What foods are not recommended? Grains  White bread. White pasta. White rice. Refined cornbread. Bagels and croissants. Crackers that contain trans fat. Vegetables  Creamed or fried vegetables. Vegetables in a cheese sauce. Regular canned vegetables. Regular canned tomato sauce and paste. Regular tomato and vegetable juices. Fruits  Canned fruit in light or heavy syrup. Fruit juice. Meat and Other Protein Products  Fatty cuts of meat. Ribs, chicken wings, bacon, sausage, bologna, salami, chitterlings, fatback, hot dogs, bratwurst, and packaged luncheon meats. Salted nuts and seeds. Canned beans with salt. Dairy  Whole or 2% milk, cream, half-and-half, and cream cheese. Whole-fat or sweetened yogurt. Full-fat cheeses or blue cheese. Nondairy creamers and whipped toppings. Processed cheese, cheese spreads, or cheese curds. Condiments  Onion and garlic salt, seasoned salt, table salt, and sea salt. Canned and packaged gravies. Worcestershire sauce. Tartar sauce. Barbecue sauce. Teriyaki sauce. Soy sauce, including reduced sodium. Steak sauce. Fish sauce. Oyster sauce. Cocktail sauce. Horseradish. Ketchup and mustard. Meat flavorings and tenderizers. Bouillon cubes. Hot sauce. Tabasco sauce. Marinades. Taco seasonings. Relishes. Fats and Oils  Butter, stick margarine, lard, shortening, ghee, and bacon fat. Coconut, palm kernel, or palm oils. Regular salad dressings. Other  Pickles and olives. Salted popcorn and pretzels. The items listed above may not be a complete list of foods and beverages to avoid. Contact your dietitian for more information.  Where can I find more information? National Heart, Lung, and Blood Institute: www.nhlbi.nih.gov/health/health-topics/topics/dash/ This information is not intended to replace advice given to you by your health care provider. Make sure you discuss any questions you have with your health care provider. Document  Released: 12/19/2010 Document Revised: 06/07/2015 Document Reviewed: 11/03/2012 Elsevier Interactive Patient Education  2017 Elsevier Inc.  

## 2015-12-10 NOTE — Assessment & Plan Note (Signed)
On Levothyroxine, continue to monitor 

## 2015-12-10 NOTE — Assessment & Plan Note (Signed)
Encouraged DASH diet, decrease po intake and increase exercise as tolerated. Needs 7-8 hours of sleep nightly. Avoid trans fats, eat small, frequent meals every 4-5 hours with lean proteins, complex carbs and healthy fats. Minimize simple carbs 

## 2015-12-17 ENCOUNTER — Telehealth: Payer: Self-pay | Admitting: Family Medicine

## 2015-12-17 MED ORDER — BUDESONIDE-FORMOTEROL FUMARATE 160-4.5 MCG/ACT IN AERO: 2.0000 | INHALATION_SPRAY | Freq: Two times a day (BID) | RESPIRATORY_TRACT | 4 refills | Status: DC

## 2015-12-17 NOTE — Telephone Encounter (Signed)
Cleaning out files - discarded RX from 02/27/15 for Symbicort 160-4.5 mcg/act inhaler Dr. Abner GreenspanBlyth

## 2015-12-17 NOTE — Telephone Encounter (Signed)
Caller name:[facility] Relationship to patient: Can be [phone] Pharmacy:  Reason for call:Requesitng refill on symbicort

## 2015-12-17 NOTE — Telephone Encounter (Signed)
Refill done.  

## 2015-12-18 NOTE — Telephone Encounter (Addendum)
[facility]; initiated on Cover My Meds; patient must have tried and failed alternative: Advair [HFA or diskus] and one of the following alternatives: Arnuity Ellipta, Flovent, QVAR, Perforomist, Serevent Diskus]; only Pulmicort Flexhaler 180 mcg/ACT documented in EMR as past Rx tried. LMOM with contact name and number for return call RE: PA process and need to meet insurance requirements and to discuss medication alternatives/SLS

## 2016-01-01 ENCOUNTER — Ambulatory Visit (INDEPENDENT_AMBULATORY_CARE_PROVIDER_SITE_OTHER): Payer: Managed Care, Other (non HMO) | Admitting: Family Medicine

## 2016-01-01 ENCOUNTER — Other Ambulatory Visit (INDEPENDENT_AMBULATORY_CARE_PROVIDER_SITE_OTHER): Payer: Managed Care, Other (non HMO)

## 2016-01-01 VITALS — BP 138/99 | HR 91

## 2016-01-01 DIAGNOSIS — I10 Essential (primary) hypertension: Secondary | ICD-10-CM

## 2016-01-01 LAB — COMPREHENSIVE METABOLIC PANEL
ALBUMIN: 4 g/dL (ref 3.5–5.2)
ALT: 27 U/L (ref 0–35)
AST: 25 U/L (ref 0–37)
Alkaline Phosphatase: 58 U/L (ref 39–117)
BUN: 15 mg/dL (ref 6–23)
CALCIUM: 9.1 mg/dL (ref 8.4–10.5)
CHLORIDE: 102 meq/L (ref 96–112)
CO2: 29 mEq/L (ref 19–32)
Creatinine, Ser: 0.75 mg/dL (ref 0.40–1.20)
GFR: 90.95 mL/min (ref >60.00)
Glucose, Bld: 99 mg/dL (ref 70–99)
POTASSIUM: 3.9 meq/L (ref 3.5–5.1)
Sodium: 137 mEq/L (ref 135–145)
Total Bilirubin: 0.3 mg/dL (ref 0.2–1.2)
Total Protein: 6.7 g/dL (ref 6.0–8.3)

## 2016-01-01 LAB — CBC
HEMATOCRIT: 38.6 % (ref 36.0–46.0)
Hemoglobin: 13.3 g/dL (ref 12.0–15.0)
MCHC: 34.4 g/dL (ref 30.0–36.0)
MCV: 89.1 fl (ref 78.0–100.0)
PLATELETS: 229 10*3/uL (ref 150.0–400.0)
RBC: 4.33 Mil/uL (ref 3.87–5.11)
RDW: 12.6 % (ref 11.5–15.5)
WBC: 8.4 10*3/uL (ref 4.0–10.5)

## 2016-01-01 LAB — LIPID PANEL
CHOL/HDL RATIO: 7
CHOLESTEROL: 228 mg/dL — AB (ref 0–200)
HDL: 32.6 mg/dL — ABNORMAL LOW (ref >39.00)

## 2016-01-01 LAB — LDL CHOLESTEROL, DIRECT: LDL DIRECT: 72 mg/dL

## 2016-01-01 NOTE — Progress Notes (Signed)
Nurseblood pressure check note reviewed. Agree with documention and plan. 

## 2016-01-01 NOTE — Progress Notes (Signed)
Pre visit review using our clinic tool,if applicable. No additional management support is needed unless otherwise documented below in the visit note.   Patient in for BP check per order from S. Blyth,MD dated 11/29/15. BP today =138/99 P = 91  Per Dr.Blyth patient to increase Enalipril 5mg  to 2 times daily and return in 3-4 weeks for recheck of BP.  RN blood pressure check note reviewed. Agree with documention and plan.  Danise EdgeBLYTH, STACEY, MD

## 2016-01-11 ENCOUNTER — Other Ambulatory Visit: Payer: Self-pay | Admitting: Family Medicine

## 2016-01-19 ENCOUNTER — Other Ambulatory Visit: Payer: Self-pay | Admitting: Family Medicine

## 2016-01-22 ENCOUNTER — Telehealth: Payer: Self-pay | Admitting: Family Medicine

## 2016-01-22 NOTE — Telephone Encounter (Signed)
°  Relation to ZO:XWRUpt:self Call back number:[phone] [id number] - [place],  - [address] [facility] [address]  Reason for call:  Patient states direction changed for enalapril (VASOTEC) 5 MG tablet instead of 1 tablet a day it changed to 2 tablets a day therefore patient is completely out requesting refill today, please advise

## 2016-01-24 ENCOUNTER — Ambulatory Visit (INDEPENDENT_AMBULATORY_CARE_PROVIDER_SITE_OTHER): Payer: Managed Care, Other (non HMO) | Admitting: Family Medicine

## 2016-01-24 VITALS — BP 117/63 | HR 76

## 2016-01-24 DIAGNOSIS — I10 Essential (primary) hypertension: Secondary | ICD-10-CM | POA: Diagnosis not present

## 2016-01-24 MED ORDER — ENALAPRIL MALEATE 5 MG PO TABS: 5.0000 mg | ORAL_TABLET | Freq: Two times a day (BID) | ORAL | 2 refills | Status: DC

## 2016-01-24 NOTE — Progress Notes (Signed)
Pre visit review using our clinic review tool, if applicable. No additional management support is needed unless otherwise documented below in the visit note.  Patient presents in office for blood pressure check per OV note 01/01/16. Reviewed medication & regimen with the patient. She denies headache, dizziness, etc.; patient only reports feeling tired, but much better than in the past. Today's readings were as follow: BP 117/63 P 76.  Per Dr. Abner GreenspanBlyth: Continue current medication & regimen. Keep appointment with PCP on 05/27/16 at 9:30 AM.  Informed patient of the provider's recommendations. She voiced understanding and did not have any further concerns before leaving the nurse visit. Nurse blood pressure check note reviewed. Agree with documention and plan.

## 2016-01-24 NOTE — Patient Instructions (Addendum)
Per Dr. Abner GreenspanBlyth: Continue current medication & regimen. Keep appointment with PCP on 05/27/16 at 9:30 AM.

## 2016-02-21 ENCOUNTER — Other Ambulatory Visit: Payer: Self-pay | Admitting: Family Medicine

## 2016-03-16 ENCOUNTER — Other Ambulatory Visit: Payer: Self-pay | Admitting: Family Medicine

## 2016-03-27 ENCOUNTER — Other Ambulatory Visit: Payer: Self-pay | Admitting: Family Medicine

## 2016-03-27 ENCOUNTER — Encounter: Payer: Self-pay | Admitting: Family Medicine

## 2016-03-27 MED ORDER — SYNTHROID 137 MCG PO TABS: 137.0000 ug | ORAL_TABLET | Freq: Every day | ORAL | 1 refills | Status: DC

## 2016-03-28 ENCOUNTER — Telehealth: Payer: Self-pay | Admitting: Family Medicine

## 2016-03-28 NOTE — Telephone Encounter (Signed)
Initiate PA on Cover my meds for Synthroid Awaiting response. [place] the [place] through [place] and will let her know once they respond.

## 2016-04-01 ENCOUNTER — Other Ambulatory Visit: Payer: Self-pay | Admitting: Family Medicine

## 2016-04-01 DIAGNOSIS — B009 Herpesviral infection, unspecified: Secondary | ICD-10-CM

## 2016-04-01 MED ORDER — AMPHETAMINE-DEXTROAMPHETAMINE 20 MG PO TABS: 20.0000 mg | ORAL_TABLET | Freq: Two times a day (BID) | ORAL | 0 refills | Status: DC

## 2016-04-01 NOTE — Telephone Encounter (Signed)
Jewal from [facility] called in to advise on the authorization form. She says that the claim keeps getting denied because on question 1 it has been answered as yes, and the answer should be no so that claim will be approved.   She says that she is going to refax form to have correction made.

## 2016-04-22 ENCOUNTER — Encounter: Payer: Self-pay | Admitting: Internal Medicine

## 2016-04-22 ENCOUNTER — Ambulatory Visit (HOSPITAL_BASED_OUTPATIENT_CLINIC_OR_DEPARTMENT_OTHER)
Admission: RE | Admit: 2016-04-22 | Discharge: 2016-04-22 | Disposition: A | Discharge status: Home / Self Care | Payer: Managed Care, Other (non HMO) | Source: Ambulatory Visit | Attending: Internal Medicine | Admitting: Internal Medicine

## 2016-04-22 ENCOUNTER — Ambulatory Visit (INDEPENDENT_AMBULATORY_CARE_PROVIDER_SITE_OTHER): Payer: Managed Care, Other (non HMO) | Admitting: Internal Medicine

## 2016-04-22 DIAGNOSIS — M542 Cervicalgia: Secondary | ICD-10-CM

## 2016-04-22 DIAGNOSIS — M25512 Pain in left shoulder: Secondary | ICD-10-CM

## 2016-04-22 DIAGNOSIS — R937 Abnormal findings on diagnostic imaging of other parts of musculoskeletal system: Secondary | ICD-10-CM | POA: Diagnosis not present

## 2016-04-22 MED ORDER — MELOXICAM 15 MG PO TABS: 15.0000 mg | ORAL_TABLET | Freq: Every day | ORAL | 0 refills | Status: DC | PRN

## 2016-04-22 NOTE — Patient Instructions (Addendum)
Take an x-ray of the neck  Meloxicam once a day as needed for pain.  Always take it with food because may cause gastritis and ulcers.  If you notice nausea, stomach pain, change in the color of stools --->  Stop the medicine and let us know  Flexeril as needed  Warm compress  Call if not gradually improving

## 2016-04-22 NOTE — Progress Notes (Signed)
Pre visit review using our clinic review tool, if applicable. No additional management support is needed unless otherwise documented below in the visit note. 
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
## 2016-04-23 ENCOUNTER — Other Ambulatory Visit: Payer: Self-pay | Admitting: Family Medicine

## 2016-05-25 ENCOUNTER — Other Ambulatory Visit: Payer: Self-pay | Admitting: Internal Medicine

## 2016-05-27 ENCOUNTER — Encounter: Payer: Self-pay | Admitting: Family Medicine

## 2016-05-27 ENCOUNTER — Ambulatory Visit (INDEPENDENT_AMBULATORY_CARE_PROVIDER_SITE_OTHER): Payer: Managed Care, Other (non HMO) | Admitting: Family Medicine

## 2016-05-27 VITALS — BP 98/68 | HR 83 | Temp 98.6°F | Resp 18 | Wt 156.0 lb

## 2016-05-27 DIAGNOSIS — I10 Essential (primary) hypertension: Secondary | ICD-10-CM | POA: Diagnosis not present

## 2016-05-27 DIAGNOSIS — E039 Hypothyroidism, unspecified: Secondary | ICD-10-CM | POA: Diagnosis not present

## 2016-05-27 DIAGNOSIS — M25512 Pain in left shoulder: Secondary | ICD-10-CM | POA: Diagnosis not present

## 2016-05-27 DIAGNOSIS — R41840 Attention and concentration deficit: Secondary | ICD-10-CM

## 2016-05-27 DIAGNOSIS — B009 Herpesviral infection, unspecified: Secondary | ICD-10-CM

## 2016-05-27 DIAGNOSIS — E782 Mixed hyperlipidemia: Secondary | ICD-10-CM

## 2016-05-27 DIAGNOSIS — J029 Acute pharyngitis, unspecified: Secondary | ICD-10-CM

## 2016-05-27 DIAGNOSIS — Z0000 Encounter for general adult medical examination without abnormal findings: Secondary | ICD-10-CM | POA: Diagnosis not present

## 2016-05-27 HISTORY — DX: Acute pharyngitis, unspecified: J02.9

## 2016-05-27 HISTORY — DX: Pain in left shoulder: M25.512

## 2016-05-27 LAB — CBC
HEMATOCRIT: 39.6 % (ref 36.0–46.0)
Hemoglobin: 13.2 g/dL (ref 12.0–15.0)
MCHC: 33.4 g/dL (ref 30.0–36.0)
MCV: 91.9 fl (ref 78.0–100.0)
PLATELETS: 227 10*3/uL (ref 150.0–400.0)
RBC: 4.31 Mil/uL (ref 3.87–5.11)
RDW: 12.9 % (ref 11.5–15.5)
WBC: 9.5 10*3/uL (ref 4.0–10.5)

## 2016-05-27 LAB — COMPREHENSIVE METABOLIC PANEL
ALT: 14 U/L (ref 0–35)
AST: 13 U/L (ref 0–37)
Albumin: 3.9 g/dL (ref 3.5–5.2)
Alkaline Phosphatase: 49 U/L (ref 39–117)
BUN: 17 mg/dL (ref 6–23)
CALCIUM: 9.2 mg/dL (ref 8.4–10.5)
CHLORIDE: 104 meq/L (ref 96–112)
CO2: 27 meq/L (ref 19–32)
Creatinine, Ser: 0.82 mg/dL (ref 0.40–1.20)
GFR: 81.89 mL/min (ref >60.00)
Glucose, Bld: 90 mg/dL (ref 70–99)
POTASSIUM: 4 meq/L (ref 3.5–5.1)
Sodium: 135 mEq/L (ref 135–145)
Total Bilirubin: 0.4 mg/dL (ref 0.2–1.2)
Total Protein: 6.8 g/dL (ref 6.0–8.3)

## 2016-05-27 LAB — LIPID PANEL
Cholesterol: 205 mg/dL — ABNORMAL HIGH (ref 0–200)
HDL: 35.4 mg/dL — AB (ref >39.00)
NONHDL: 169.99
TRIGLYCERIDES: 357 mg/dL — AB (ref 0.0–149.0)
Total CHOL/HDL Ratio: 6
VLDL: 71.4 mg/dL — AB (ref 0.0–40.0)

## 2016-05-27 LAB — LDL CHOLESTEROL, DIRECT: Direct LDL: 94 mg/dL

## 2016-05-27 LAB — TSH: TSH: 0.31 u[IU]/mL — ABNORMAL LOW (ref 0.35–4.50)

## 2016-05-27 MED ORDER — AMPHETAMINE-DEXTROAMPHETAMINE 20 MG PO TABS: 20.0000 mg | ORAL_TABLET | Freq: Two times a day (BID) | ORAL | 0 refills | Status: DC

## 2016-05-27 MED ORDER — AMOXICILLIN 500 MG PO CAPS: 500.0000 mg | ORAL_CAPSULE | Freq: Three times a day (TID) | ORAL | 0 refills | Status: DC

## 2016-05-27 NOTE — Assessment & Plan Note (Signed)
Recurrent each year. Restart Amoxicillin and otc supplements as directe

## 2016-05-27 NOTE — Patient Instructions (Addendum)
[facility]    Preventive Care 40-64 Years, Female Preventive care refers to lifestyle choices and visits with your health care provider that can promote health and wellness. What does preventive care include?  A yearly physical exam. This is also called an annual well check.  Dental exams once or twice a year.  Routine eye exams. Ask your health care provider how often you should have your eyes checked.  Personal lifestyle choices, including:  Daily care of your teeth and gums.  Regular physical activity.  Eating a healthy diet.  Avoiding tobacco and drug use.  Limiting alcohol use.  Practicing safe sex.  Taking low-dose aspirin daily starting at age 68.  Taking vitamin and mineral supplements as recommended by your health care provider. What happens during an annual well check? The services and screenings done by your health care provider during your annual well check will depend on your age, overall health, lifestyle risk factors, and family history of disease. Counseling  Your health care provider may ask you questions about your:  Alcohol use.  Tobacco use.  Drug use.  Emotional well-being.  Home and relationship well-being.  Sexual activity.  Eating habits.  Work and work Astronomer.  Method of birth control.  Menstrual cycle.  Pregnancy history. Screening  You may have the following tests or measurements:  Height, weight, and BMI.  Blood pressure.  Lipid and cholesterol levels. These may be checked every 5 years, or more frequently if you are over 35 years old.  Skin check.  Lung cancer screening. You may have this screening every year starting at age 46 if you have a 30-pack-year history of smoking and currently smoke or have quit within the past 15 years.  Fecal occult blood test (FOBT) of the stool. You may have this test every year starting at age 94.  Flexible sigmoidoscopy or colonoscopy. You may have a sigmoidoscopy every 5 years or a  colonoscopy every 10 years starting at age 60.  Hepatitis C blood test.  Hepatitis B blood test.  Sexually transmitted disease (STD) testing.  Diabetes screening. This is done by checking your blood sugar (glucose) after you have not eaten for a while (fasting). You may have this done every 1-3 years.  Mammogram. This may be done every 1-2 years. Talk to your health care provider about when you should start having regular mammograms. This may depend on whether you have a family history of breast cancer.  BRCA-related cancer screening. This may be done if you have a family history of breast, ovarian, tubal, or peritoneal cancers.  Pelvic exam and Pap test. This may be done every 3 years starting at age 31. Starting at age 46, this may be done every 5 years if you have a Pap test in combination with an HPV test.  Bone density scan. This is done to screen for osteoporosis. You may have this scan if you are at high risk for osteoporosis. Discuss your test results, treatment options, and if necessary, the need for more tests with your health care provider. Vaccines  Your health care provider may recommend certain vaccines, such as:  Influenza vaccine. This is recommended every year.  Tetanus, diphtheria, and acellular pertussis (Tdap, Td) vaccine. You may need a Td booster every 10 years.  Varicella vaccine. You may need this if you have not been vaccinated.  Zoster vaccine. You may need this after age 107.  Measles, mumps, and rubella (MMR) vaccine. You may need at least one dose of MMR  if you were born in 1957 or later. You may also need a second dose.  Pneumococcal 13-valent conjugate (PCV13) vaccine. You may need this if you have certain conditions and were not previously vaccinated.  Pneumococcal polysaccharide (PPSV23) vaccine. You may need one or two doses if you smoke cigarettes or if you have certain conditions.  Meningococcal vaccine. You may need this if you have certain  conditions.  Hepatitis A vaccine. You may need this if you have certain conditions or if you travel or work in places where you may be exposed to hepatitis A.  Hepatitis B vaccine. You may need this if you have certain conditions or if you travel or work in places where you may be exposed to hepatitis B.  Haemophilus influenzae type b (Hib) vaccine. You may need this if you have certain conditions. Talk to your health care provider about which screenings and vaccines you need and how often you need them. This information is not intended to replace advice given to you by your health care provider. Make sure you discuss any questions you have with your health care provider. Document Released: 01/26/2015 Document Revised: 09/19/2015 Document Reviewed: 10/31/2014 Elsevier Interactive Patient Education  2017 [employer].

## 2016-05-27 NOTE — Assessment & Plan Note (Signed)
Well controlled, no changes to meds. Encouraged heart healthy diet such as the DASH diet and exercise as tolerated.  °

## 2016-05-27 NOTE — Assessment & Plan Note (Signed)
Patient encouraged to maintain heart healthy diet, regular exercise, adequate sleep. Consider daily probiotics. Take medications as prescribed. Labs ordered today 

## 2016-05-27 NOTE — Assessment & Plan Note (Signed)
On Levothyroxine, continue to monitor 

## 2016-05-27 NOTE — Assessment & Plan Note (Signed)
Has had good weight loss since her last visit
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
## 2016-05-27 NOTE — Assessment & Plan Note (Signed)
Good response to Adderall no side effects given refills today and contract and UDS updated

## 2016-06-30 ENCOUNTER — Other Ambulatory Visit: Payer: Self-pay | Admitting: Family Medicine

## 2016-07-20 ENCOUNTER — Other Ambulatory Visit: Payer: Self-pay | Admitting: Family Medicine

## 2016-07-24 ENCOUNTER — Other Ambulatory Visit: Payer: Self-pay | Admitting: Family Medicine

## 2016-07-24 DIAGNOSIS — B009 Herpesviral infection, unspecified: Secondary | ICD-10-CM

## 2016-07-24 MED ORDER — ACYCLOVIR 200 MG PO CAPS: ORAL_CAPSULE | ORAL | 3 refills | Status: DC

## 2016-07-31 ENCOUNTER — Other Ambulatory Visit: Payer: Self-pay | Admitting: Family Medicine

## 2016-08-05 ENCOUNTER — Telehealth: Payer: Self-pay | Admitting: Family Medicine

## 2016-08-05 NOTE — Telephone Encounter (Signed)
Mailed EOB to pt at home address per her request.

## 2016-08-14 ENCOUNTER — Other Ambulatory Visit: Payer: Self-pay | Admitting: Family Medicine

## 2016-10-14 ENCOUNTER — Other Ambulatory Visit: Payer: Self-pay | Admitting: Family Medicine

## 2016-10-14 NOTE — Telephone Encounter (Signed)
On 8.2.18 4 months worth was faxed/thx dmf

## 2016-10-20 ENCOUNTER — Other Ambulatory Visit: Payer: Self-pay | Admitting: Family Medicine

## 2016-11-16 ENCOUNTER — Other Ambulatory Visit: Payer: Self-pay | Admitting: Family Medicine

## 2016-11-27 ENCOUNTER — Encounter: Payer: Self-pay | Admitting: Family Medicine

## 2016-11-27 ENCOUNTER — Ambulatory Visit: Payer: BLUE CROSS/BLUE SHIELD | Admitting: Family Medicine

## 2016-11-27 ENCOUNTER — Ambulatory Visit (HOSPITAL_BASED_OUTPATIENT_CLINIC_OR_DEPARTMENT_OTHER)
Admission: RE | Admit: 2016-11-27 | Discharge: 2016-11-27 | Disposition: A | Discharge status: Home / Self Care | Payer: BLUE CROSS/BLUE SHIELD | Source: Ambulatory Visit | Attending: Family Medicine | Admitting: Family Medicine

## 2016-11-27 VITALS — BP 140/92 | HR 72 | Temp 98.6°F | Resp 16 | Ht 61.81 in | Wt 152.0 lb

## 2016-11-27 DIAGNOSIS — R0781 Pleurodynia: Secondary | ICD-10-CM

## 2016-11-27 DIAGNOSIS — B009 Herpesviral infection, unspecified: Secondary | ICD-10-CM

## 2016-11-27 DIAGNOSIS — R41840 Attention and concentration deficit: Secondary | ICD-10-CM | POA: Diagnosis not present

## 2016-11-27 DIAGNOSIS — E782 Mixed hyperlipidemia: Secondary | ICD-10-CM | POA: Diagnosis not present

## 2016-11-27 DIAGNOSIS — I10 Essential (primary) hypertension: Secondary | ICD-10-CM | POA: Diagnosis not present

## 2016-11-27 DIAGNOSIS — E039 Hypothyroidism, unspecified: Secondary | ICD-10-CM

## 2016-11-27 LAB — CBC
HEMATOCRIT: 41.2 % (ref 36.0–46.0)
HEMOGLOBIN: 13.7 g/dL (ref 12.0–15.0)
MCHC: 33.2 g/dL (ref 30.0–36.0)
MCV: 93.1 fl (ref 78.0–100.0)
Platelets: 219 10*3/uL (ref 150.0–400.0)
RBC: 4.43 Mil/uL (ref 3.87–5.11)
RDW: 13.2 % (ref 11.5–15.5)
WBC: 9.5 10*3/uL (ref 4.0–10.5)

## 2016-11-27 LAB — T4, FREE: FREE T4: 0.81 ng/dL (ref 0.60–1.60)

## 2016-11-27 LAB — COMPREHENSIVE METABOLIC PANEL
ALBUMIN: 3.9 g/dL (ref 3.5–5.2)
ALK PHOS: 47 U/L (ref 39–117)
ALT: 13 U/L (ref 0–35)
AST: 16 U/L (ref 0–37)
BILIRUBIN TOTAL: 0.4 mg/dL (ref 0.2–1.2)
BUN: 13 mg/dL (ref 6–23)
CO2: 27 mEq/L (ref 19–32)
Calcium: 9.1 mg/dL (ref 8.4–10.5)
Chloride: 102 mEq/L (ref 96–112)
Creatinine, Ser: 0.87 mg/dL (ref 0.40–1.20)
GFR: 76.29 mL/min (ref >60.00)
Glucose, Bld: 91 mg/dL (ref 70–99)
POTASSIUM: 4.2 meq/L (ref 3.5–5.1)
SODIUM: 136 meq/L (ref 135–145)
TOTAL PROTEIN: 6.8 g/dL (ref 6.0–8.3)

## 2016-11-27 LAB — LIPID PANEL
CHOLESTEROL: 273 mg/dL — AB (ref 0–200)
HDL: 46.1 mg/dL (ref >39.00)
NonHDL: 226.41
Total CHOL/HDL Ratio: 6
Triglycerides: 318 mg/dL — ABNORMAL HIGH (ref 0.0–149.0)
VLDL: 63.6 mg/dL — AB (ref 0.0–40.0)

## 2016-11-27 LAB — TSH: TSH: 1.22 u[IU]/mL (ref 0.35–4.50)

## 2016-11-27 LAB — LDL CHOLESTEROL, DIRECT: Direct LDL: 158 mg/dL

## 2016-11-27 MED ORDER — AMPHETAMINE-DEXTROAMPHETAMINE 20 MG PO TABS: 20.0000 mg | ORAL_TABLET | Freq: Two times a day (BID) | ORAL | 0 refills | Status: DC

## 2016-11-27 NOTE — Progress Notes (Signed)
Subjective:  I acted as a Neurosurgeon for [employer] Fuller Song, RMA   Patient ID: Kristen [name], female    DOB: 07-Jul-1975, 41 y.o.   MRN: [id number]  Chief Complaint  Patient presents with  . Follow-up    HPI  Patient is in today for 6 month follow up and overall she is doing well. No recent febrile illness or hospitalizations. She had a fall 2 months ago onto her left side and she had significant back and chest pain. Only a small spot of chest pain persists and worse with movement and palpation. Denies palp/SOB/HA/congestion/fevers/GI or GU c/o. Taking meds as prescribed  Patient Care Team: Bradd Canary, MD as PCP - General (Family Medicine)   Past Medical History:  Diagnosis Date  . Abnormal cervical cytology 09/26/2011   LGSIL in 2011, patient reports repeat pap was normal, has had intermittent abnormals with bx in past but always normalized, has had cryotherapy with good results Menarche at 17, irregular without birth control at times, very heavy LMP 08/26/2011 No gyn concerns, MGM last year normal   . ADD (attention deficit disorder)   . Allergy    seasonal  . Anemia    blood loss  . Asthma    mild, intermittent  . Bronchitis, acute 12/16/2010  . Chest pain, atypical 10/08/2010  . Chicken pox as a child  . Concussion 09/24/2010  . Contraceptive management 02/17/2011  . Decreased libido 09/26/2011  . Depression 2002   post partum  . Family history of heart disease   . Female bladder prolapse, acquired 09/24/2010  . Fibromyalgia   . Headache 09/26/2013  . History of concussion 09/24/2010  . History of migraines 09/24/2010  . 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 HTN (hypertension) 09/24/2010  . Hyperlipidemia   . Hyperlipidemia   . Hypertension   . Interstitial cystitis 02/18/2011  . Left shoulder pain 05/27/2016  . Lymphadenopathy 09/24/2010  . Migraine 09/24/2010  . Migraine aura, persistent, with cerebral infarct, status over 72 hours 09/24/2010  . Neck pain, musculoskeletal 12/16/2010  .  Overweight 05/29/2015  . Overweight(278.02) 12/26/2011  . Pharyngitis 05/27/2016  . Poor concentration 12/16/2010  . Preventative health care 11/28/2012  . Recurrent UTI   . Renal lithiasis 09/24/2010  . RUQ pain 05/27/2011  . Subacute and chronic vaginitis 09/24/2010  . Thyroid disease   . Tick bite 12/04/2013  . Transient left leg weakness 05/21/2014  . Vaginitis and vulvovaginitis 05/21/2014    Past Surgical History:  Procedure Laterality Date  . INCONTINENCE SURGERY  2003    Family History  Problem Relation Age of Onset  . Heart attack Father   . Hyperlipidemia Father   . Hypertension Father   . Alcohol abuse Father   . Heart disease Father   . Heart attack Brother 45  . Hypertension Brother   . Heart disease Brother 45       MI  . Hyperlipidemia Brother   . Dementia Maternal Grandfather 31       early stages  . Stroke Maternal Grandfather   . Heart attack Paternal Grandfather   . Stroke Paternal Grandfather   . Heart disease Paternal Grandfather   . Alcohol abuse Paternal Grandfather   . Hyperlipidemia Paternal Grandfather   . Hypertension Paternal Grandfather   . Osteoporosis Mother   . Hyperlipidemia Mother   . Heart disease Maternal Grandmother   . Heart failure Maternal Grandmother     Social History   Socioeconomic History  . Marital status:  Married    Spouse name: Not on file  . Number of children: Not on file  . Years of education: Not on file  . Highest education level: Not on file  Social Needs  . Financial resource strain: Not on file  . Food insecurity - worry: Not on file  . Food insecurity - inability: Not on file  . Transportation needs - medical: Not on file  . Transportation needs - non-medical: Not on file  Occupational History  . Not on file  Tobacco Use  . Smoking status: Never Smoker  . Smokeless tobacco: Never Used  Substance and Sexual Activity  . Alcohol use: No    Alcohol/week: 0.0 oz    Comment: 0  . Drug use: No  . Sexual  activity: Yes    Partners: Male    Birth control/protection: Pill  Other Topics Concern  . Not on file  Social History Narrative   Lives with husband 2 children in a one story home.     Works as a Teacher, adult educationmassage therapist.     Education: college.    Outpatient Medications Prior to Visit  Medication Sig Dispense Refill  . acyclovir (ZOVIRAX) 200 MG capsule TAKE 1 CAPSULE (200 MG TOTAL) BY MOUTH 2 (TWO) TIMES DAILY. 180 capsule 3  . albuterol (PROVENTIL HFA;VENTOLIN HFA) 108 (90 Base) MCG/ACT inhaler Inhale 1 puff into the lungs every 4 (four) hours as needed for wheezing. 1-2 puffs po bid prn asthma, as directed by pulmonolgy 1 Inhaler 6  . albuterol (PROVENTIL) (2.5 MG/3ML) 0.083% nebulizer solution Take 3 mLs (2.5 mg total) by nebulization every 6 (six) hours as needed for wheezing or shortness of breath. 150 mL 1  . cyclobenzaprine (FLEXERIL) 10 MG tablet Take 1 tablet (10 mg total) by mouth at bedtime. 90 tablet 3  . enalapril (VASOTEC) 5 MG tablet TAKE 1 TABLET (5 MG TOTAL) BY MOUTH 2 (TWO) TIMES DAILY. 60 tablet 2  . EPINEPHrine 0.3 mg/0.3 mL IJ SOAJ injection Inject 0.3 mLs (0.3 mg total) into the muscle once. 1 Device 2  . fluconazole (DIFLUCAN) 150 MG tablet TAKE 1 TABLET ONCE, IF NEEDED. TAKE AFTER COMPLETION OF ANTIBIOTIC RX. 12 tablet 1  . HYDROcodone-acetaminophen (VICODIN) 5-500 MG tablet Take 1 tablet by mouth every 8 (eight) hours as needed for pain. 120 tablet 0  . lidocaine (XYLOCAINE) 2 % jelly APPLY TO AFFECTED AREA DAILY AS NEEDED 20 mL 1  . meloxicam (MOBIC) 15 MG tablet TAKE 1 TABLET BY MOUTH EVERY DAY AS NEEDED FOR PAIN 30 tablet 3  . nitrofurantoin, macrocrystal-monohydrate, (MACROBID) 100 MG capsule TAKE 1 CAPSULE FOR 1 DOSE AS NEEDED FOR PREVENTION OF UTI *AFTER SELF CATH, SWIMMING, HOT TUB, SEX* 90 capsule 0  . Norethindrone Acetate-Ethinyl Estrad-FE (BLISOVI 24 FE) 1-20 MG-MCG(24) tablet Take 1 tablet by mouth daily. 3 Package 3  . pentosan polysulfate (ELMIRON) 100 MG  capsule Take 1 capsule (100 mg total) by mouth 3 (three) times daily before meals. 90 capsule 5  . SUMAtriptan (IMITREX) 50 MG tablet Take 1 tablet (50 mg total) by mouth every 2 (two) hours as needed for migraine. May repeat in 2 hours if headache persists or recurs. 10 tablet 5  . SYMBICORT 160-4.5 MCG/ACT inhaler INHALE 2 PUFFS INTO THE LUNGS 2 (TWO) TIMES DAILY. 10.2 Inhaler 4  . SYNTHROID 137 MCG tablet TAKE 1 TABLET BY MOUTH EVERY DAY 90 tablet 0  . amoxicillin (AMOXIL) 500 MG capsule Take 1 capsule (500 mg total) by mouth  3 (three) times daily. 30 capsule 0  . amphetamine-dextroamphetamine (ADDERALL) 20 MG tablet Take 1 tablet (20 mg total) by mouth 2 (two) times daily. June 2018 60 tablet 0  . amphetamine-dextroamphetamine (ADDERALL) 20 MG tablet Take 1 tablet (20 mg total) by mouth 2 (two) times daily. July 2018 60 tablet 0  . amphetamine-dextroamphetamine (ADDERALL) 20 MG tablet Take 1 tablet (20 mg total) by mouth 2 (two) times daily. August  2018 60 tablet 0  . SYNTHROID 137 MCG tablet Take 1 tablet (137 mcg total) by mouth daily. 90 tablet 1   No facility-administered medications prior to visit.     Allergies  Allergen Reactions  . Peanut-Containing Drug Products Anaphylaxis  . Terazol [Terconazole]     Burning sensation    Review of Systems  Constitutional: Negative for fever and malaise/fatigue.  HENT: Negative for congestion.   Eyes: Negative for blurred vision.  Respiratory: Negative for shortness of breath.   Cardiovascular: Negative for chest pain, palpitations and leg swelling.  Gastrointestinal: Negative for abdominal pain, blood in stool and nausea.  Genitourinary: Negative for dysuria and frequency.  Musculoskeletal: Negative for falls.  Skin: Negative for rash.  Neurological: Negative for dizziness, loss of consciousness and headaches.  Endo/Heme/Allergies: Negative for environmental allergies.  Psychiatric/Behavioral: Negative for depression. The patient is  not nervous/anxious.        Objective:    Physical Exam  BP (!) 140/92   Pulse 72   Temp 98.6 F (37 C) (Oral)   Resp 16   Ht 5' 1.81" (1.57 m)   Wt 152 lb (68.9 kg)   SpO2 100%   BMI 27.97 kg/m  Wt Readings from Last 3 Encounters:  11/27/16 152 lb (68.9 kg)  05/27/16 156 lb (70.8 kg)  04/22/16 156 lb 8 oz (71 kg)   BP Readings from Last 3 Encounters:  11/27/16 (!) 140/92  05/27/16 98/68  04/22/16 126/72     Immunization History  Administered Date(s) Administered  . Influenza Split 09/26/2011  . Tdap 12/16/2010    Health Maintenance  Topic Date Due  . INFLUENZA VACCINE  11/27/2017 (Originally 08/13/2016)  . PAP SMEAR  05/10/2017  . TETANUS/TDAP  12/15/2020  . HIV Screening  Discontinued    Lab Results  Component Value Date   WBC 9.5 11/27/2016   HGB 13.7 11/27/2016   HCT 41.2 11/27/2016   PLT 219.0 11/27/2016   GLUCOSE 91 11/27/2016   CHOL 273 (H) 11/27/2016   TRIG 318.0 (H) 11/27/2016   HDL 46.10 11/27/2016   LDLDIRECT 158.0 11/27/2016   LDLCALC 136 (H) 11/22/2012   ALT 13 11/27/2016   AST 16 11/27/2016   NA 136 11/27/2016   K 4.2 11/27/2016   CL 102 11/27/2016   CREATININE 0.87 11/27/2016   BUN 13 11/27/2016   CO2 27 11/27/2016   TSH 1.22 11/27/2016   INR 1.09 05/23/2015    Lab Results  Component Value Date   TSH 1.22 11/27/2016   Lab Results  Component Value Date   WBC 9.5 11/27/2016   HGB 13.7 11/27/2016   HCT 41.2 11/27/2016   MCV 93.1 11/27/2016   PLT 219.0 11/27/2016   Lab Results  Component Value Date   NA 136 11/27/2016   K 4.2 11/27/2016   CO2 27 11/27/2016   GLUCOSE 91 11/27/2016   BUN 13 11/27/2016   CREATININE 0.87 11/27/2016   BILITOT 0.4 11/27/2016   ALKPHOS 47 11/27/2016   AST 16 11/27/2016   ALT 13 11/27/2016  PROT 6.8 11/27/2016   ALBUMIN 3.9 11/27/2016   CALCIUM 9.1 11/27/2016   ANIONGAP 11 05/23/2015   GFR 76.29 11/27/2016   Lab Results  Component Value Date   CHOL 273 (H) 11/27/2016   Lab Results    Component Value Date   HDL 46.10 11/27/2016   Lab Results  Component Value Date   LDLCALC 136 (H) 11/22/2012   Lab Results  Component Value Date   TRIG 318.0 (H) 11/27/2016   Lab Results  Component Value Date   CHOLHDL 6 11/27/2016   No results found for: HGBA1C       Assessment & Plan:   Problem List Items Addressed This Visit    Hyperlipidemia    Encouraged heart healthy diet, increase exercise, avoid trans fats, consider a krill oil cap daily      Relevant Orders   Lipid panel (Completed)   HSV infection    Will maintain prn use of Acyclovir       Relevant Medications   amphetamine-dextroamphetamine (ADDERALL) 20 MG tablet   HTN (hypertension)    Well controlled, no changes to meds. Encouraged heart healthy diet such as the DASH diet and exercise as tolerated. Recent 110/64 at other office visit and at home has been normal unless pain is spiking.       Relevant Orders   CBC (Completed)   Comprehensive metabolic panel (Completed)   Poor concentration    Doing well on Adderall, may continue prescription      Hypothyroid    On Levothyroxine, continue to monitor, TSH       Relevant Orders   TSH (Completed)   T4, free (Completed)   Rib pain on left side - Primary   Relevant Orders   DG Ribs Unilateral Left (Completed)      I have discontinued Kristen [name]'s amoxicillin. I have also changed her amphetamine-dextroamphetamine, amphetamine-dextroamphetamine, and amphetamine-dextroamphetamine. Additionally, I am having her maintain her pentosan polysulfate, albuterol, HYDROcodone-acetaminophen, EPINEPHrine, SUMAtriptan, Norethindrone Acetate-Ethinyl Estrad-FE, albuterol, cyclobenzaprine, lidocaine, meloxicam, acyclovir, fluconazole, SYMBICORT, nitrofurantoin (macrocrystal-monohydrate), SYNTHROID, and enalapril.  Meds ordered this encounter  Medications  . amphetamine-dextroamphetamine (ADDERALL) 20 MG tablet    Sig: Take 1 tablet (20 mg total) 2 (two) times  daily by mouth. November 2018    Dispense:  60 tablet    Refill:  0  . amphetamine-dextroamphetamine (ADDERALL) 20 MG tablet    Sig: Take 1 tablet (20 mg total) 2 (two) times daily by mouth. January 2019    Dispense:  60 tablet    Refill:  0  . amphetamine-dextroamphetamine (ADDERALL) 20 MG tablet    Sig: Take 1 tablet (20 mg total) 2 (two) times daily by mouth. December  2018    Dispense:  60 tablet    Refill:  0    CMA served as Neurosurgeonscribe during this visit. History, Physical and Plan performed by medical provider. Documentation and orders reviewed and attested to.  Danise EdgeStacey Muaaz Brau, MD

## 2016-11-27 NOTE — Assessment & Plan Note (Signed)
On Levothyroxine, continue to monitor, TSH

## 2016-11-27 NOTE — Patient Instructions (Signed)

## 2016-11-27 NOTE — Assessment & Plan Note (Addendum)
Well controlled, no changes to meds. Encouraged heart healthy diet such as the DASH diet and exercise as tolerated. Recent 110/64 at other office visit and at home has been normal unless pain is spiking.

## 2016-11-30 DIAGNOSIS — R0781 Pleurodynia: Secondary | ICD-10-CM | POA: Insufficient documentation

## 2016-11-30 NOTE — Assessment & Plan Note (Signed)
Persistent s/p a fall 2 months ago. She has been seeing a chiropractor and that has been helpful except there is one specific spot in her left axillae that continues to hurt. Xray is negative for fracture. Likely a bad contusion/costochondritis. Try topical lidocaine and no heavy lifting until pain is resolved.

## 2016-11-30 NOTE — Assessment & Plan Note (Signed)
Will maintain prn use of Acyclovir

## 2016-11-30 NOTE — Assessment & Plan Note (Signed)
Encouraged heart healthy diet, increase exercise, avoid trans fats, consider a krill oil cap daily 

## 2016-11-30 NOTE — Assessment & Plan Note (Signed)
Doing well on Adderall, may continue prescription

## 2016-12-03 ENCOUNTER — Encounter: Payer: Self-pay | Admitting: Family Medicine

## 2017-02-11 ENCOUNTER — Other Ambulatory Visit: Payer: Self-pay | Admitting: Family Medicine

## 2017-02-18 ENCOUNTER — Other Ambulatory Visit: Payer: Self-pay | Admitting: Family Medicine

## 2017-05-18 ENCOUNTER — Other Ambulatory Visit: Payer: Self-pay | Admitting: Family Medicine

## 2017-05-20 ENCOUNTER — Other Ambulatory Visit: Payer: Self-pay

## 2017-05-20 MED ORDER — LEVOTHYROXINE SODIUM 137 MCG PO TABS: 137.0000 ug | ORAL_TABLET | Freq: Every day | ORAL | 0 refills | Status: DC

## 2017-06-02 ENCOUNTER — Ambulatory Visit (INDEPENDENT_AMBULATORY_CARE_PROVIDER_SITE_OTHER): Payer: 59 | Admitting: Family Medicine

## 2017-06-02 ENCOUNTER — Encounter: Payer: Self-pay | Admitting: Family Medicine

## 2017-06-02 VITALS — BP 112/80 | HR 63 | Temp 98.3°F | Resp 18 | Ht 62.0 in | Wt 153.8 lb

## 2017-06-02 DIAGNOSIS — B009 Herpesviral infection, unspecified: Secondary | ICD-10-CM

## 2017-06-02 DIAGNOSIS — R41840 Attention and concentration deficit: Secondary | ICD-10-CM

## 2017-06-02 DIAGNOSIS — M79674 Pain in right toe(s): Secondary | ICD-10-CM | POA: Insufficient documentation

## 2017-06-02 DIAGNOSIS — J4520 Mild intermittent asthma, uncomplicated: Secondary | ICD-10-CM

## 2017-06-02 DIAGNOSIS — Z79899 Other long term (current) drug therapy: Secondary | ICD-10-CM

## 2017-06-02 DIAGNOSIS — T7840XD Allergy, unspecified, subsequent encounter: Secondary | ICD-10-CM

## 2017-06-02 DIAGNOSIS — Z0000 Encounter for general adult medical examination without abnormal findings: Secondary | ICD-10-CM

## 2017-06-02 DIAGNOSIS — M25512 Pain in left shoulder: Secondary | ICD-10-CM | POA: Diagnosis not present

## 2017-06-02 DIAGNOSIS — L578 Other skin changes due to chronic exposure to nonionizing radiation: Secondary | ICD-10-CM

## 2017-06-02 DIAGNOSIS — G43909 Migraine, unspecified, not intractable, without status migrainosus: Secondary | ICD-10-CM

## 2017-06-02 DIAGNOSIS — E039 Hypothyroidism, unspecified: Secondary | ICD-10-CM

## 2017-06-02 DIAGNOSIS — E782 Mixed hyperlipidemia: Secondary | ICD-10-CM | POA: Diagnosis not present

## 2017-06-02 DIAGNOSIS — I10 Essential (primary) hypertension: Secondary | ICD-10-CM | POA: Diagnosis not present

## 2017-06-02 HISTORY — DX: Pain in right toe(s): M79.674

## 2017-06-02 LAB — COMPREHENSIVE METABOLIC PANEL
ALT: 9 U/L (ref 0–35)
AST: 12 U/L (ref 0–37)
Albumin: 3.9 g/dL (ref 3.5–5.2)
Alkaline Phosphatase: 46 U/L (ref 39–117)
BILIRUBIN TOTAL: 0.3 mg/dL (ref 0.2–1.2)
BUN: 15 mg/dL (ref 6–23)
CALCIUM: 9.3 mg/dL (ref 8.4–10.5)
CO2: 27 meq/L (ref 19–32)
CREATININE: 0.75 mg/dL (ref 0.40–1.20)
Chloride: 102 mEq/L (ref 96–112)
GFR: 90.31 mL/min (ref >60.00)
GLUCOSE: 92 mg/dL (ref 70–99)
Potassium: 5.2 mEq/L — ABNORMAL HIGH (ref 3.5–5.1)
Sodium: 136 mEq/L (ref 135–145)
Total Protein: 6.6 g/dL (ref 6.0–8.3)

## 2017-06-02 LAB — CBC
HCT: 41 % (ref 36.0–46.0)
Hemoglobin: 13.7 g/dL (ref 12.0–15.0)
MCHC: 33.4 g/dL (ref 30.0–36.0)
MCV: 93 fl (ref 78.0–100.0)
PLATELETS: 232 10*3/uL (ref 150.0–400.0)
RBC: 4.41 Mil/uL (ref 3.87–5.11)
RDW: 12.2 % (ref 11.5–15.5)
WBC: 7.5 10*3/uL (ref 4.0–10.5)

## 2017-06-02 LAB — LIPID PANEL
Cholesterol: 256 mg/dL — ABNORMAL HIGH (ref 0–200)
HDL: 43.3 mg/dL (ref >39.00)
NONHDL: 212.95
Total CHOL/HDL Ratio: 6
Triglycerides: 269 mg/dL — ABNORMAL HIGH (ref 0.0–149.0)
VLDL: 53.8 mg/dL — ABNORMAL HIGH (ref 0.0–40.0)

## 2017-06-02 LAB — TSH: TSH: 0.51 u[IU]/mL (ref 0.35–4.50)

## 2017-06-02 LAB — URIC ACID: Uric Acid, Serum: 5.3 mg/dL (ref 2.4–7.0)

## 2017-06-02 LAB — LDL CHOLESTEROL, DIRECT: LDL DIRECT: 157 mg/dL

## 2017-06-02 MED ORDER — ALBUTEROL SULFATE HFA 108 (90 BASE) MCG/ACT IN AERS: 1.0000 | INHALATION_SPRAY | RESPIRATORY_TRACT | 6 refills | Status: DC | PRN

## 2017-06-02 MED ORDER — LEVOTHYROXINE SODIUM 137 MCG PO TABS: 137.0000 ug | ORAL_TABLET | Freq: Every day | ORAL | 0 refills | Status: DC

## 2017-06-02 MED ORDER — ENALAPRIL MALEATE 5 MG PO TABS: 5.0000 mg | ORAL_TABLET | Freq: Two times a day (BID) | ORAL | 2 refills | Status: DC

## 2017-06-02 MED ORDER — AMPHETAMINE-DEXTROAMPHETAMINE 20 MG PO TABS: 20.0000 mg | ORAL_TABLET | Freq: Two times a day (BID) | ORAL | 0 refills | Status: DC

## 2017-06-02 MED ORDER — ACYCLOVIR 200 MG PO CAPS: ORAL_CAPSULE | ORAL | 3 refills | Status: DC

## 2017-06-02 MED ORDER — BUDESONIDE-FORMOTEROL FUMARATE 160-4.5 MCG/ACT IN AERO: 2.0000 | INHALATION_SPRAY | Freq: Two times a day (BID) | RESPIRATORY_TRACT | 4 refills | Status: DC

## 2017-06-02 MED ORDER — EPINEPHRINE 0.3 MG/0.3ML IJ SOAJ: 0.3000 mg | Freq: Once | INTRAMUSCULAR | 2 refills | Status: AC

## 2017-06-02 MED ORDER — NORETHIN ACE-ETH ESTRAD-FE 1-20 MG-MCG(24) PO TABS: 1.0000 | ORAL_TABLET | Freq: Every day | ORAL | 3 refills | Status: DC

## 2017-06-02 MED ORDER — LIDOCAINE HCL 2 % EX GEL: CUTANEOUS | 1 refills | Status: DC

## 2017-06-02 NOTE — Assessment & Plan Note (Addendum)
Has had a rough year with significant congestion but started to improve yesterday. Does not tolerate nasal sprays and the antihistamines have too  Many side effects. Can consider Singulair in future as needed. Had allergy shots in past without relief

## 2017-06-02 NOTE — Assessment & Plan Note (Signed)
No recent flares 

## 2017-06-02 NOTE — Assessment & Plan Note (Signed)
Sun damaged skin and FH of melanoma in Mom and GF

## 2017-06-02 NOTE — Patient Instructions (Signed)
Lidocaine gel  As needed Preventive Care 42-42 Years, Female Preventive care refers to lifestyle choices and visits with your health care provider that can promote health and wellness. What does preventive care include?  A yearly physical exam. This is also called an annual well check.  Dental exams once or twice a year.  Routine eye exams. Ask your health care provider how often you should have your eyes checked.  Personal lifestyle choices, including: ? Daily care of your teeth and gums. ? Regular physical activity. ? Eating a healthy diet. ? Avoiding tobacco and drug use. ? Limiting alcohol use. ? Practicing safe sex. ? Taking vitamin and mineral supplements as recommended by your health care provider. What happens during an annual well check? The services and screenings done by your health care provider during your annual well check will depend on your age, overall health, lifestyle risk factors, and family history of disease. Counseling Your health care provider may ask you questions about your:  Alcohol use.  Tobacco use.  Drug use.  Emotional well-being.  Home and relationship well-being.  Sexual activity.  Eating habits.  Work and work Statistician.  Method of birth control.  Menstrual cycle.  Pregnancy history.  Screening You may have the following tests or measurements:  Height, weight, and BMI.  Diabetes screening. This is done by checking your blood sugar (glucose) after you have not eaten for a while (fasting).  Blood pressure.  Lipid and cholesterol levels. These may be checked every 5 years starting at age 42.  Skin check.  Hepatitis C blood test.  Hepatitis B blood test.  Sexually transmitted disease (STD) testing.  BRCA-related cancer screening. This may be done if you have a family history of breast, ovarian, tubal, or peritoneal cancers.  Pelvic exam and Pap test. This may be done every 3 years starting at age 42. Starting at age  42, this may be done every 5 years if you have a Pap test in combination with an HPV test.  Discuss your test results, treatment options, and if necessary, the need for more tests with your health care provider. Vaccines Your health care provider may recommend certain vaccines, such as:  Influenza vaccine. This is recommended every year.  Tetanus, diphtheria, and acellular pertussis (Tdap, Td) vaccine. You may need a Td booster every 10 years.  Varicella vaccine. You may need this if you have not been vaccinated.  HPV vaccine. If you are 32 or younger, you may need three doses over 6 months.  Measles, mumps, and rubella (MMR) vaccine. You may need at least one dose of MMR. You may also need a second dose.  Pneumococcal 13-valent conjugate (PCV13) vaccine. You may need this if you have certain conditions and were not previously vaccinated.  Pneumococcal polysaccharide (PPSV23) vaccine. You may need one or two doses if you smoke cigarettes or if you have certain conditions.  Meningococcal vaccine. One dose is recommended if you are age 42-21 years and a first-year college student living in a residence hall, or if you have one of several medical conditions. You may also need additional booster doses.  Hepatitis A vaccine. You may need this if you have certain conditions or if you travel or work in places where you may be exposed to hepatitis A.  Hepatitis B vaccine. You may need this if you have certain conditions or if you travel or work in places where you may be exposed to hepatitis B.  Haemophilus influenzae type b (Hib) vaccine.  You may need this if you have certain risk factors.  Talk to your health care provider about which screenings and vaccines you need and how often you need them. This information is not intended to replace advice given to you by your health care provider. Make sure you discuss any questions you have with your health care provider. Document Released: 02/25/2001  Document Revised: 09/19/2015 Document Reviewed: 10/31/2014 Elsevier Interactive Patient Education  [employer].

## 2017-06-02 NOTE — Assessment & Plan Note (Signed)
Adderall is refilled today and UDS is updated

## 2017-06-02 NOTE — Assessment & Plan Note (Signed)
Patient encouraged to maintain heart healthy diet, regular exercise, adequate sleep. Consider daily probiotics. Take medications as prescribed. Given and reviewed copy of ACP documents from [place] Secretary of State and encouraged to complete and return 

## 2017-06-02 NOTE — Assessment & Plan Note (Signed)
Intermittent in the past year and even the water hits it it can be painful. Feels swollen at times and maybe mild redness at times. Check uric acid level

## 2017-06-02 NOTE — Assessment & Plan Note (Signed)
Encouraged heart healthy diet, increase exercise, avoid trans fats, consider a krill oil cap daily 

## 2017-06-02 NOTE — Assessment & Plan Note (Addendum)
No recent flares 

## 2017-06-02 NOTE — Assessment & Plan Note (Signed)
Well controlled, no changes to meds. Encouraged heart healthy diet such as the DASH diet and exercise as tolerated.  °

## 2017-06-02 NOTE — Progress Notes (Signed)
Patient ID: Kristen [name], female   DOB: [birth year], 42 y.o.   MRN: [id number]     Subjective:    Patient ID: Kristen [name], female    DOB: 1976-01-07, 58 y.o.   MRN: [id number]  No chief complaint on file.   HPI Patient is in today for annual preventative exam and follow up on chronic medical concerns including hypertension, left shoulder pain, hyperlipidemia, ADD and [name]. She feels well today. Her shoulder pain is less frequent and intense than previously. Uses hydrocodone prn with relief infrequently. No concerning side effects. She is noting her headaches and asthma are well controlled. She stays active and maintains a heart healthy diet. She manages her activities of daily living well. Well controlled, no changes to meds. Denies CP/palp/SOB/HA/congestion/fevers/GI or GU c/o. Taking meds as prescribed  Past Medical History:  Diagnosis Date  . Abnormal cervical cytology 09/26/2011   LGSIL in 2011, patient reports repeat pap was normal, has had intermittent abnormals with bx in past but always normalized, has had cryotherapy with good results Menarche at 17, irregular without birth control at times, very heavy LMP 08/26/2011 No gyn concerns, MGM last year normal   . ADD (attention deficit disorder)   . Allergy    seasonal  . Anemia    blood loss  . Asthma    mild, intermittent  . Bronchitis, acute 12/16/2010  . Chest pain, atypical 10/08/2010  . Chicken pox as a child  . Concussion 09/24/2010  . Contraceptive management 02/17/2011  . Decreased libido 09/26/2011  . Depression 2002   post partum  . Family history of heart disease   . Female bladder prolapse, acquired 09/24/2010  . Fibromyalgia   . Great toe pain, right 06/02/2017  . Headache 09/26/2013  . History of concussion 09/24/2010  . History of migraines 09/24/2010  . 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 HTN (hypertension) 09/24/2010  . Hyperlipidemia   . Hyperlipidemia   . Hypertension   . Interstitial cystitis 02/18/2011  . Left shoulder pain  05/27/2016  . Lymphadenopathy 09/24/2010  . Migraine 09/24/2010  . Migraine aura, persistent, with cerebral infarct, status over 72 hours 09/24/2010  . Neck pain, musculoskeletal 12/16/2010  . Overweight 05/29/2015  . Overweight(278.02) 12/26/2011  . Pharyngitis 05/27/2016  . Poor concentration 12/16/2010  . Preventative health care 11/28/2012  . Recurrent UTI   . Renal lithiasis 09/24/2010  . RUQ pain 05/27/2011  . Subacute and chronic vaginitis 09/24/2010  . Thyroid disease   . Tick bite 12/04/2013  . Transient left leg weakness 05/21/2014  . Vaginitis and vulvovaginitis 05/21/2014    Past Surgical History:  Procedure Laterality Date  . INCONTINENCE SURGERY  2003    Family History  Problem Relation Age of Onset  . Heart attack Father   . Hyperlipidemia Father   . Hypertension Father   . Alcohol abuse Father   . Heart disease Father   . Heart attack Brother 45  . Hypertension Brother   . Heart disease Brother 45       MI  . Hyperlipidemia Brother   . Dementia Maternal Grandfather 25       early stages  . Stroke Maternal Grandfather   . Heart attack Paternal Grandfather   . Stroke Paternal Grandfather   . Heart disease Paternal Grandfather   . Alcohol abuse Paternal Grandfather   . Hyperlipidemia Paternal Grandfather   . Hypertension Paternal Grandfather   . Osteoporosis Mother   . Hyperlipidemia Mother   . Heart disease  Maternal Grandmother   . Heart failure Maternal Grandmother     Social History   Socioeconomic History  . Marital status: Married    Spouse name: Not on file  . Number of children: Not on file  . Years of education: Not on file  . Highest education level: Not on file  Occupational History  . Not on file  Social Needs  . Financial resource strain: Not on file  . Food insecurity:    Worry: Not on file    Inability: Not on file  . Transportation needs:    Medical: Not on file    Non-medical: Not on file  Tobacco Use  . Smoking status: Never Smoker   . Smokeless tobacco: Never Used  Substance and Sexual Activity  . Alcohol use: No    Alcohol/week: 0.0 oz    Comment: 0  . Drug use: No  . Sexual activity: Yes    Partners: Male    Birth control/protection: Pill  Lifestyle  . Physical activity:    Days per week: Not on file    Minutes per session: Not on file  . Stress: Not on file  Relationships  . Social connections:    Talks on phone: Not on file    Gets together: Not on file    Attends religious service: Not on file    Active member of club or organization: Not on file    Attends meetings of clubs or organizations: Not on file    Relationship status: Not on file  . Intimate partner violence:    Fear of current or ex partner: Not on file    Emotionally abused: Not on file    Physically abused: Not on file    Forced sexual activity: Not on file  Other Topics Concern  . Not on file  Social History Narrative   Lives with husband 2 children in a one story home.     Works as a Teacher, adult education.     Education: college.    Outpatient Medications Prior to Visit  Medication Sig Dispense Refill  . albuterol (PROVENTIL) (2.5 MG/3ML) 0.083% nebulizer solution Take 3 mLs (2.5 mg total) by nebulization every 6 (six) hours as needed for wheezing or shortness of breath. 150 mL 1  . cyclobenzaprine (FLEXERIL) 10 MG tablet Take 1 tablet (10 mg total) by mouth at bedtime. 90 tablet 3  . fluconazole (DIFLUCAN) 150 MG tablet TAKE 1 TABLET ONCE, IF NEEDED. TAKE AFTER COMPLETION OF ANTIBIOTIC RX. 12 tablet 1  . HYDROcodone-acetaminophen (VICODIN) 5-500 MG tablet Take 1 tablet by mouth every 8 (eight) hours as needed for pain. 120 tablet 0  . meloxicam (MOBIC) 15 MG tablet TAKE 1 TABLET BY MOUTH EVERY DAY AS NEEDED FOR PAIN 30 tablet 3  . nitrofurantoin, macrocrystal-monohydrate, (MACROBID) 100 MG capsule TAKE 1 CAPSULE FOR 1 DOSE AS NEEDED FOR PREVENTION OF UTI *AFTER SELF CATH, SWIMMING, HOT TUB, SEX* 90 capsule 0  . pentosan polysulfate  (ELMIRON) 100 MG capsule Take 1 capsule (100 mg total) by mouth 3 (three) times daily before meals. 90 capsule 5  . SUMAtriptan (IMITREX) 50 MG tablet Take 1 tablet (50 mg total) by mouth every 2 (two) hours as needed for migraine. May repeat in 2 hours if headache persists or recurs. 10 tablet 5  . acyclovir (ZOVIRAX) 200 MG capsule TAKE 1 CAPSULE (200 MG TOTAL) BY MOUTH 2 (TWO) TIMES DAILY. 180 capsule 3  . albuterol (PROVENTIL HFA;VENTOLIN HFA) 108 (90 Base) MCG/ACT  inhaler Inhale 1 puff into the lungs every 4 (four) hours as needed for wheezing. 1-2 puffs po bid prn asthma, as directed by pulmonolgy 1 Inhaler 6  . amphetamine-dextroamphetamine (ADDERALL) 20 MG tablet Take 1 tablet (20 mg total) 2 (two) times daily by mouth. November 2018 60 tablet 0  . amphetamine-dextroamphetamine (ADDERALL) 20 MG tablet Take 1 tablet (20 mg total) 2 (two) times daily by mouth. January 2019 60 tablet 0  . amphetamine-dextroamphetamine (ADDERALL) 20 MG tablet Take 1 tablet (20 mg total) 2 (two) times daily by mouth. December  2018 60 tablet 0  . enalapril (VASOTEC) 5 MG tablet TAKE 1 TABLET BY MOUTH TWICE A DAY 60 tablet 2  . EPINEPHrine 0.3 mg/0.3 mL IJ SOAJ injection Inject 0.3 mLs (0.3 mg total) into the muscle once. 1 Device 2  . levothyroxine (SYNTHROID) 137 MCG tablet Take 1 tablet (137 mcg total) by mouth daily. 90 tablet 0  . lidocaine (XYLOCAINE) 2 % jelly APPLY TO AFFECTED AREA DAILY AS NEEDED 20 mL 1  . Norethindrone Acetate-Ethinyl Estrad-FE (BLISOVI 24 FE) 1-20 MG-MCG(24) tablet Take 1 tablet by mouth daily. 3 Package 3  . SYMBICORT 160-4.5 MCG/ACT inhaler INHALE 2 PUFFS INTO THE LUNGS 2 (TWO) TIMES DAILY. 10.2 Inhaler 4   No facility-administered medications prior to visit.     Allergies  Allergen Reactions  . Peanut-Containing Drug Products Anaphylaxis  . Terazol [Terconazole]     Burning sensation    Review of Systems  Constitutional: Negative for fever and malaise/fatigue.  HENT:  Positive for congestion and ear pain.   Eyes: Negative for blurred vision.  Respiratory: Negative for shortness of breath.   Cardiovascular: Negative for chest pain, palpitations and leg swelling.  Gastrointestinal: Negative for abdominal pain, blood in stool and nausea.  Genitourinary: Negative for dysuria and frequency.  Musculoskeletal: Positive for joint pain. Negative for falls.  Skin: Negative for rash.  Neurological: Negative for dizziness, loss of consciousness and headaches.  Endo/Heme/Allergies: Negative for environmental allergies.  Psychiatric/Behavioral: Negative for depression. The patient is not nervous/anxious.        Objective:    Physical Exam  Constitutional: She is oriented to person, place, and time. No distress.  HENT:  Head: Normocephalic and atraumatic.  Right Ear: External ear normal.  Left Ear: External ear normal.  Nose: Nose normal.  Mouth/Throat: Oropharynx is clear and moist. No oropharyngeal exudate.  Eyes: Pupils are equal, round, and reactive to light. Conjunctivae are normal. Right eye exhibits no discharge. Left eye exhibits no discharge. No scleral icterus.  Neck: Normal range of motion. Neck supple. No thyromegaly present.  Cardiovascular: Normal rate, regular rhythm, normal heart sounds and intact distal pulses.  No murmur heard. Pulmonary/Chest: Effort normal and breath sounds normal. No respiratory distress. She has no wheezes. She has no rales.  Abdominal: Soft. Bowel sounds are normal. She exhibits no distension and no mass. There is no tenderness.  Musculoskeletal: Normal range of motion. She exhibits no edema or tenderness.  Lymphadenopathy:    She has no cervical adenopathy.  Neurological: She is alert and oriented to person, place, and time. She has normal reflexes. She displays normal reflexes. No cranial nerve deficit. Coordination normal.  Skin: Skin is warm and dry. No rash noted. She is not diaphoretic.    BP 112/80 (BP Location:  Left Arm, Patient Position: Sitting, Cuff Size: Normal)   Pulse 63   Temp 98.3 F (36.8 C) (Oral)   Resp 18   Ht  (  1.575 m)   Wt 153 lb 12.8 oz (69.8 kg)   SpO2 98%   BMI 28.13 kg/m  Wt Readings from Last 3 Encounters:  06/02/17 153 lb 12.8 oz (69.8 kg)  11/27/16 152 lb (68.9 kg)  05/27/16 156 lb (70.8 kg)     Lab Results  Component Value Date   WBC 7.5 06/02/2017   HGB 13.7 06/02/2017   HCT 41.0 06/02/2017   PLT 232.0 06/02/2017   GLUCOSE 92 06/02/2017   CHOL 256 (H) 06/02/2017   TRIG 269.0 (H) 06/02/2017   HDL 43.30 06/02/2017   LDLDIRECT 157.0 06/02/2017   LDLCALC 136 (H) 11/22/2012   ALT 9 06/02/2017   AST 12 06/02/2017   NA 136 06/02/2017   K 5.2 (H) 06/02/2017   CL 102 06/02/2017   CREATININE 0.75 06/02/2017   BUN 15 06/02/2017   CO2 27 06/02/2017   TSH 0.51 06/02/2017   INR 1.09 05/23/2015    Lab Results  Component Value Date   TSH 0.51 06/02/2017   Lab Results  Component Value Date   WBC 7.5 06/02/2017   HGB 13.7 06/02/2017   HCT 41.0 06/02/2017   MCV 93.0 06/02/2017   PLT 232.0 06/02/2017   Lab Results  Component Value Date   NA 136 06/02/2017   K 5.2 (H) 06/02/2017   CO2 27 06/02/2017   GLUCOSE 92 06/02/2017   BUN 15 06/02/2017   CREATININE 0.75 06/02/2017   BILITOT 0.3 06/02/2017   ALKPHOS 46 06/02/2017   AST 12 06/02/2017   ALT 9 06/02/2017   PROT 6.6 06/02/2017   ALBUMIN 3.9 06/02/2017   CALCIUM 9.3 06/02/2017   ANIONGAP 11 05/23/2015   GFR 90.31 06/02/2017   Lab Results  Component Value Date   CHOL 256 (H) 06/02/2017   Lab Results  Component Value Date   HDL 43.30 06/02/2017   Lab Results  Component Value Date   LDLCALC 136 (H) 11/22/2012   Lab Results  Component Value Date   TRIG 269.0 (H) 06/02/2017   Lab Results  Component Value Date   CHOLHDL 6 06/02/2017   No results found for: HGBA1C     Assessment & Plan:   Problem List Items Addressed This Visit    Allergy    Has had a rough year with  significant congestion but started to improve yesterday. Does not tolerate nasal sprays and the antihistamines have too  Many side effects. Can consider Singulair in future as needed. Had allergy shots in past without relief      Mild intermittent asthma    No recent flares      Relevant Medications   albuterol (PROVENTIL HFA;VENTOLIN HFA) 108 (90 Base) MCG/ACT inhaler   budesonide-formoterol (SYMBICORT) 160-4.5 MCG/ACT inhaler   Hyperlipidemia    Encouraged heart healthy diet, increase exercise, avoid trans fats, consider a krill oil cap daily      Relevant Medications   enalapril (VASOTEC) 5 MG tablet   Other Relevant Orders   Lipid panel (Completed)   HSV infection   Relevant Medications   acyclovir (ZOVIRAX) 200 MG capsule   HTN (hypertension)    Well controlled, no changes to meds. Encouraged heart healthy diet such as the DASH diet and exercise as tolerated.       Relevant Medications   enalapril (VASOTEC) 5 MG tablet   Other Relevant Orders   CBC (Completed)   Comprehensive metabolic panel (Completed)   TSH (Completed)   Migraine    No recent flares  Relevant Medications   enalapril (VASOTEC) 5 MG tablet   Poor concentration    Adderall is refilled today and UDS is updated      Hypothyroid    On Levothyroxine, continue to monitor      Relevant Medications   levothyroxine (SYNTHROID) 137 MCG tablet   Preventative health care    Patient encouraged to maintain heart healthy diet, regular exercise, adequate sleep. Consider daily probiotics. Take medications as prescribed. Given and reviewed copy of ACP documents from [facility] Secretary of State and encouraged to complete and return      Left shoulder pain    Left shoulder pain is improved but occasionally flares and infrequently she needs hydrocodone to manage the pain so she is allowed a refill. UDS is obtained. Contract is UTD. No concerns identified      Great toe pain, right    Intermittent in the past  year and even the water hits it it can be painful. Feels swollen at times and maybe mild redness at times. Check uric acid level      Relevant Orders   Uric acid (Completed)   Sun-damaged skin    Sun damaged skin and FH of melanoma in Mom and GF      Relevant Orders   Ambulatory referral to Dermatology    Other Visit Diagnoses    High risk medication use    -  Primary   Relevant Orders   Pain Mgmt, Profile 8 w/Conf, U (Completed)      I have changed Maridee M. Baroni's SYMBICORT to budesonide-formoterol. I have also changed her amphetamine-dextroamphetamine, amphetamine-dextroamphetamine, amphetamine-dextroamphetamine, and enalapril. I am also having her maintain her pentosan polysulfate, HYDROcodone-acetaminophen, SUMAtriptan, albuterol, cyclobenzaprine, meloxicam, fluconazole, nitrofurantoin (macrocrystal-monohydrate), acyclovir, albuterol, levothyroxine, Norethindrone Acetate-Ethinyl Estrad-FE, and lidocaine.  Meds ordered this encounter  Medications  . amphetamine-dextroamphetamine (ADDERALL) 20 MG tablet    Sig: Take 1 tablet (20 mg total) by mouth 2 (two) times daily. July 2019    Dispense:  60 tablet    Refill:  0  . amphetamine-dextroamphetamine (ADDERALL) 20 MG tablet    Sig: Take 1 tablet (20 mg total) by mouth 2 (two) times daily. June 2019    Dispense:  60 tablet    Refill:  0  . amphetamine-dextroamphetamine (ADDERALL) 20 MG tablet    Sig: Take 1 tablet (20 mg total) by mouth 2 (two) times daily. May 2019    Dispense:  60 tablet    Refill:  0  . acyclovir (ZOVIRAX) 200 MG capsule    Sig: TAKE 1 CAPSULE (200 MG TOTAL) BY MOUTH 2 (TWO) TIMES DAILY.    Dispense:  180 capsule    Refill:  3  . albuterol (PROVENTIL HFA;VENTOLIN HFA) 108 (90 Base) MCG/ACT inhaler    Sig: Inhale 1 puff into the lungs every 4 (four) hours as needed for wheezing. 1-2 puffs po bid prn asthma, as directed by pulmonolgy    Dispense:  1 Inhaler    Refill:  6  . budesonide-formoterol (SYMBICORT)  160-4.5 MCG/ACT inhaler    Sig: Inhale 2 puffs into the lungs 2 (two) times daily.    Dispense:  10.2 Inhaler    Refill:  4  . levothyroxine (SYNTHROID) 137 MCG tablet    Sig: Take 1 tablet (137 mcg total) by mouth daily.    Dispense:  90 tablet    Refill:  0  . Norethindrone Acetate-Ethinyl Estrad-FE (BLISOVI 24 FE) 1-20 MG-MCG(24) tablet    Sig: Take 1 tablet  by mouth daily.    Dispense:  3 Package    Refill:  3  . enalapril (VASOTEC) 5 MG tablet    Sig: Take 1 tablet (5 mg total) by mouth 2 (two) times daily.    Dispense:  60 tablet    Refill:  2  . EPINEPHrine 0.3 mg/0.3 mL IJ SOAJ injection    Sig: Inject 0.3 mLs (0.3 mg total) into the muscle once for 1 dose.    Dispense:  1 Device    Refill:  2  . lidocaine (XYLOCAINE) 2 % jelly    Sig: APPLY TO AFFECTED AREA DAILY AS NEEDED    Dispense:  20 mL    Refill:  1    CMA served as scribe during this visit. History, Physical and Plan performed by medical provider. Documentation and orders reviewed and attested to.  Danise Edge, MD

## 2017-06-02 NOTE — Assessment & Plan Note (Signed)
On Levothyroxine, continue to monitor 

## 2017-06-04 LAB — PAIN MGMT, PROFILE 8 W/CONF, U
6 ACETYLMORPHINE: NEGATIVE ng/mL (ref <10)
ALCOHOL METABOLITES: NEGATIVE ng/mL (ref <500)
Amphetamine: 2010 ng/mL — ABNORMAL HIGH (ref <250)
Amphetamines: POSITIVE ng/mL — AB (ref <500)
Benzodiazepines: NEGATIVE ng/mL (ref <100)
Buprenorphine, Urine: NEGATIVE ng/mL (ref <5)
COCAINE METABOLITE: NEGATIVE ng/mL (ref <150)
CREATININE: 126.3 mg/dL
MDMA: NEGATIVE ng/mL (ref <500)
Marijuana Metabolite: NEGATIVE ng/mL (ref <20)
Methamphetamine: NEGATIVE ng/mL (ref <250)
OPIATES: NEGATIVE ng/mL (ref <100)
Oxidant: NEGATIVE ug/mL (ref <200)
Oxycodone: NEGATIVE ng/mL (ref <100)
pH: 6.75 (ref 4.5–9.0)

## 2017-06-08 NOTE — Assessment & Plan Note (Signed)
Left shoulder pain is improved but occasionally flares and infrequently she needs hydrocodone to manage the pain so she is allowed a refill. UDS is obtained. Contract is UTD. No concerns identified

## 2017-07-02 ENCOUNTER — Other Ambulatory Visit (INDEPENDENT_AMBULATORY_CARE_PROVIDER_SITE_OTHER): Payer: 59

## 2017-07-02 DIAGNOSIS — E875 Hyperkalemia: Secondary | ICD-10-CM

## 2017-07-02 LAB — COMPREHENSIVE METABOLIC PANEL
ALBUMIN: 3.9 g/dL (ref 3.5–5.2)
ALT: 14 U/L (ref 0–35)
AST: 14 U/L (ref 0–37)
Alkaline Phosphatase: 44 U/L (ref 39–117)
BUN: 16 mg/dL (ref 6–23)
CALCIUM: 9 mg/dL (ref 8.4–10.5)
CHLORIDE: 103 meq/L (ref 96–112)
CO2: 24 meq/L (ref 19–32)
CREATININE: 0.85 mg/dL (ref 0.40–1.20)
GFR: 78.13 mL/min (ref >60.00)
Glucose, Bld: 91 mg/dL (ref 70–99)
Potassium: 4.1 mEq/L (ref 3.5–5.1)
Sodium: 136 mEq/L (ref 135–145)
Total Bilirubin: 0.4 mg/dL (ref 0.2–1.2)
Total Protein: 6.7 g/dL (ref 6.0–8.3)

## 2017-08-25 ENCOUNTER — Other Ambulatory Visit: Payer: Self-pay | Admitting: Family Medicine

## 2017-09-20 ENCOUNTER — Other Ambulatory Visit: Payer: Self-pay | Admitting: Family Medicine

## 2017-12-01 ENCOUNTER — Ambulatory Visit: Payer: 59 | Admitting: Family Medicine

## 2017-12-01 ENCOUNTER — Encounter: Payer: Self-pay | Admitting: Family Medicine

## 2017-12-01 VITALS — BP 104/82 | HR 87 | Temp 98.0°F | Resp 18 | Ht 62.0 in | Wt 155.0 lb

## 2017-12-01 DIAGNOSIS — E039 Hypothyroidism, unspecified: Secondary | ICD-10-CM

## 2017-12-01 DIAGNOSIS — N3010 Interstitial cystitis (chronic) without hematuria: Secondary | ICD-10-CM | POA: Diagnosis not present

## 2017-12-01 DIAGNOSIS — M25512 Pain in left shoulder: Secondary | ICD-10-CM

## 2017-12-01 DIAGNOSIS — I10 Essential (primary) hypertension: Secondary | ICD-10-CM

## 2017-12-01 DIAGNOSIS — R41840 Attention and concentration deficit: Secondary | ICD-10-CM

## 2017-12-01 DIAGNOSIS — R0789 Other chest pain: Secondary | ICD-10-CM

## 2017-12-01 DIAGNOSIS — E782 Mixed hyperlipidemia: Secondary | ICD-10-CM

## 2017-12-01 DIAGNOSIS — Z79899 Other long term (current) drug therapy: Secondary | ICD-10-CM | POA: Diagnosis not present

## 2017-12-01 MED ORDER — AMPHETAMINE-DEXTROAMPHET ER 20 MG PO CP24: 20.0000 mg | ORAL_CAPSULE | ORAL | 0 refills | Status: DC

## 2017-12-01 MED ORDER — HYDROCODONE-ACETAMINOPHEN 5-325 MG PO TABS: 1.0000 | ORAL_TABLET | Freq: Four times a day (QID) | ORAL | 0 refills | Status: DC | PRN

## 2017-12-01 MED ORDER — AMPHETAMINE-DEXTROAMPHET ER 20 MG PO CP24: 20.0000 mg | ORAL_CAPSULE | Freq: Every day | ORAL | 0 refills | Status: DC

## 2017-12-01 NOTE — Patient Instructions (Signed)
Interstitial Cystitis Interstitial cystitis is a condition that causes inflammation of the bladder. The bladder is a hollow organ in the lower part of your abdomen. It stores urine after the urine is made by your kidneys. With interstitial cystitis, you may have pain in the bladder area. You may also have a frequent and urgent need to urinate. The severity of interstitial cystitis can vary from person to person. You may have flare-ups of the condition, and then it may go away for a while. For many people who have this condition, it becomes a long-term problem. What are the causes? The cause of this condition is not known. What increases the risk? This condition is more likely to develop in women. What are the signs or symptoms? Symptoms of interstitial cystitis vary, and they can change over time. Symptoms may include:  Discomfort or pain in the bladder area. This can range from mild to severe. The pain may change in intensity as the bladder fills with urine or as it empties.  Pelvic pain.  An urgent need to urinate.  Frequent urination.  Pain during sexual intercourse.  Pinpoint bleeding on the bladder wall.  For women, the symptoms often get worse during menstruation. How is this diagnosed? This condition is diagnosed by evaluating your symptoms and ruling out other causes. A physical exam will be done. Various tests may be done to rule out other conditions. Common tests include:  Urine tests.  Cystoscopy. In this test, a tool that is like a very thin telescope is used to look into your bladder.  Biopsy. This involves taking a sample of tissue from the bladder wall to be examined under a microscope.  How is this treated? There is no cure for interstitial cystitis, but treatment methods are available to control your symptoms. Work closely with your health care provider to find the treatments that will be most effective for you. Treatment options may include:  Medicines to relieve  pain and to help reduce the number of times that you feel the need to urinate.  Bladder training. This involves learning ways to control when you urinate, such as: ? Urinating at scheduled times. ? Training yourself to delay urination. ? Doing exercises (Kegel exercises) to strengthen the muscles that control urine flow.  Lifestyle changes, such as changing your diet or taking steps to control stress.  Use of a device that provides electrical stimulation in order to reduce pain.  A procedure that stretches your bladder by filling it with air or fluid.  Surgery. This is rare. It is only done for extreme cases if other treatments do not help.  Follow these instructions at home:  Take medicines only as directed by your health care provider.  Use bladder training techniques as directed. ? Keep a bladder diary to find out which foods, liquids, or activities make your symptoms worse. ? Use your bladder diary to schedule bathroom trips. If you are away from home, plan to be near a bathroom at each of your scheduled times. ? Make sure you urinate just before you leave the house and just before you go to bed.  Do Kegel exercises as directed by your health care provider.  Do not drink alcohol.  Do not use any tobacco products, including cigarettes, chewing tobacco, or electronic cigarettes. If you need help quitting, ask your health care provider.  Make dietary changes as directed by your health care provider. You may need to avoid spicy foods and foods that contain a high amount   of potassium.  Limit your drinking of beverages that stimulate urination. These include soda, coffee, and tea.  Keep all follow-up visits as directed by your health care provider. This is important. Contact a health care provider if:  Your symptoms do not get better after treatment.  Your pain and discomfort are getting worse.  You have more frequent urges to urinate.  You have a fever. Get help right away  if:  You are not able to control your bladder at all. This information is not intended to replace advice given to you by your health care provider. Make sure you discuss any questions you have with your health care provider. Document Released: 08/31/2003 Document Revised: 06/07/2015 Document Reviewed: 09/06/2013 Elsevier Interactive Patient Education  2018 [employer]  

## 2017-12-01 NOTE — Assessment & Plan Note (Signed)
Uses Hydrocodone during a flare will refill today

## 2017-12-01 NOTE — Progress Notes (Signed)
Subjective:    Patient ID: Kristen [name], female    DOB: 19-Sep-1975, 42 y.o.   MRN: [id number]  Chief Complaint  Patient presents with  . Follow-up    HPI Patient is in today for follow up.This past Saturday she had an episode of sudden, sharp chest pain while she was crying after her dog died. No associated symptoms other than some rapid breathing while she was upset. It past without any further episodes but she feels her chest is still mildly sore since then. Her previous cardiac work up was unremarkable. She notes some intermittent pain from her IC which responds to the Hydrocodone which she uses infrequently. Her Hydrocodone also helps her left shoulder pain which occurs infrequently as well. Denies palp/HA/congestion/fevers/GI or GU c/o. Taking meds as prescribed  Past Medical History:  Diagnosis Date  . Abnormal cervical cytology 09/26/2011   LGSIL in 2011, patient reports repeat pap was normal, has had intermittent abnormals with bx in past but always normalized, has had cryotherapy with good results Menarche at 17, irregular without birth control at times, very heavy LMP 08/26/2011 No gyn concerns, MGM last year normal   . ADD (attention deficit disorder)   . Allergy    seasonal  . Anemia    blood loss  . Asthma    mild, intermittent  . Bronchitis, acute 12/16/2010  . Chest pain, atypical 10/08/2010  . Chicken pox as a child  . Concussion 09/24/2010  . Contraceptive management 02/17/2011  . Decreased libido 09/26/2011  . Depression 2002   post partum  . Family history of heart disease   . Female bladder prolapse, acquired 09/24/2010  . Fibromyalgia   . Great toe pain, right 06/02/2017  . Headache 09/26/2013  . History of concussion 09/24/2010  . History of migraines 09/24/2010  . 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 HTN (hypertension) 09/24/2010  . Hyperlipidemia   . Hyperlipidemia   . Hypertension   . Interstitial cystitis 02/18/2011  . Left shoulder pain 05/27/2016  . Lymphadenopathy  09/24/2010  . Migraine 09/24/2010  . Migraine aura, persistent, with cerebral infarct, status over 72 hours 09/24/2010  . Neck pain, musculoskeletal 12/16/2010  . Overweight 05/29/2015  . Overweight(278.02) 12/26/2011  . Pharyngitis 05/27/2016  . Poor concentration 12/16/2010  . Preventative health care 11/28/2012  . Recurrent UTI   . Renal lithiasis 09/24/2010  . RUQ pain 05/27/2011  . Subacute and chronic vaginitis 09/24/2010  . Thyroid disease   . Tick bite 12/04/2013  . Transient left leg weakness 05/21/2014  . Vaginitis and vulvovaginitis 05/21/2014    Past Surgical History:  Procedure Laterality Date  . INCONTINENCE SURGERY  2003    Family History  Problem Relation Age of Onset  . Heart attack Father   . Hyperlipidemia Father   . Hypertension Father   . Alcohol abuse Father   . Heart disease Father   . Heart attack Brother 45  . Hypertension Brother   . Heart disease Brother 45       MI  . Hyperlipidemia Brother   . Dementia Maternal Grandfather 6       early stages  . Stroke Maternal Grandfather   . Heart attack Paternal Grandfather   . Stroke Paternal Grandfather   . Heart disease Paternal Grandfather   . Alcohol abuse Paternal Grandfather   . Hyperlipidemia Paternal Grandfather   . Hypertension Paternal Grandfather   . Osteoporosis Mother   . Hyperlipidemia Mother   . Heart disease Maternal Grandmother   .  Heart failure Maternal Grandmother     Social History   Socioeconomic History  . Marital status: Married    Spouse name: Not on file  . Number of children: Not on file  . Years of education: Not on file  . Highest education level: Not on file  Occupational History  . Not on file  Social Needs  . Financial resource strain: Not on file  . Food insecurity:    Worry: Not on file    Inability: Not on file  . Transportation needs:    Medical: Not on file    Non-medical: Not on file  Tobacco Use  . Smoking status: Never Smoker  . Smokeless tobacco: Never  Used  Substance and Sexual Activity  . Alcohol use: No    Alcohol/week: 0.0 standard drinks    Comment: 0  . Drug use: No  . Sexual activity: Yes    Partners: Male    Birth control/protection: Pill  Lifestyle  . Physical activity:    Days per week: Not on file    Minutes per session: Not on file  . Stress: Not on file  Relationships  . Social connections:    Talks on phone: Not on file    Gets together: Not on file    Attends religious service: Not on file    Active member of club or organization: Not on file    Attends meetings of clubs or organizations: Not on file    Relationship status: Not on file  . Intimate partner violence:    Fear of current or ex partner: Not on file    Emotionally abused: Not on file    Physically abused: Not on file    Forced sexual activity: Not on file  Other Topics Concern  . Not on file  Social History Narrative   Lives with husband 2 children in a one story home.     Works as a Teacher, adult education.     Education: college.    Outpatient Medications Prior to Visit  Medication Sig Dispense Refill  . acyclovir (ZOVIRAX) 200 MG capsule TAKE 1 CAPSULE (200 MG TOTAL) BY MOUTH 2 (TWO) TIMES DAILY. 180 capsule 3  . albuterol (PROVENTIL HFA;VENTOLIN HFA) 108 (90 Base) MCG/ACT inhaler Inhale 1 puff into the lungs every 4 (four) hours as needed for wheezing. 1-2 puffs po bid prn asthma, as directed by pulmonolgy 1 Inhaler 6  . albuterol (PROVENTIL) (2.5 MG/3ML) 0.083% nebulizer solution Take 3 mLs (2.5 mg total) by nebulization every 6 (six) hours as needed for wheezing or shortness of breath. 150 mL 1  . amphetamine-dextroamphetamine (ADDERALL) 20 MG tablet Take 1 tablet (20 mg total) by mouth 2 (two) times daily. July 2019 60 tablet 0  . budesonide-formoterol (SYMBICORT) 160-4.5 MCG/ACT inhaler Inhale 2 puffs into the lungs 2 (two) times daily. 10.2 Inhaler 4  . cyclobenzaprine (FLEXERIL) 10 MG tablet Take 1 tablet (10 mg total) by mouth at bedtime. 90  tablet 3  . enalapril (VASOTEC) 5 MG tablet TAKE 1 TABLET BY MOUTH TWICE A DAY 60 tablet 2  . fluconazole (DIFLUCAN) 150 MG tablet TAKE 1 TABLET ONCE, IF NEEDED. TAKE AFTER COMPLETION OF ANTIBIOTIC RX. 12 tablet 1  . levothyroxine (SYNTHROID) 137 MCG tablet Take 1 tablet (137 mcg total) by mouth daily. 90 tablet 0  . lidocaine (XYLOCAINE) 2 % jelly APPLY TO AFFECTED AREA DAILY AS NEEDED 20 mL 1  . meloxicam (MOBIC) 15 MG tablet TAKE 1 TABLET BY MOUTH EVERY  DAY AS NEEDED FOR PAIN 30 tablet 3  . nitrofurantoin, macrocrystal-monohydrate, (MACROBID) 100 MG capsule TAKE 1 CAPSULE FOR 1 DOSE AS NEEDED FOR PREVENTION OF UTI *AFTER SELF CATH, SWIMMING, HOT TUB, SEX* 90 capsule 0  . Norethindrone Acetate-Ethinyl Estrad-FE (BLISOVI 24 FE) 1-20 MG-MCG(24) tablet Take 1 tablet by mouth daily. 3 Package 3  . pentosan polysulfate (ELMIRON) 100 MG capsule Take 1 capsule (100 mg total) by mouth 3 (three) times daily before meals. 90 capsule 5  . SUMAtriptan (IMITREX) 50 MG tablet Take 1 tablet (50 mg total) by mouth every 2 (two) hours as needed for migraine. May repeat in 2 hours if headache persists or recurs. 10 tablet 5  . HYDROcodone-acetaminophen (VICODIN) 5-500 MG tablet Take 1 tablet by mouth every 8 (eight) hours as needed for pain. 120 tablet 0  . amphetamine-dextroamphetamine (ADDERALL) 20 MG tablet Take 1 tablet (20 mg total) by mouth 2 (two) times daily. June 2019 (Patient not taking: Reported on 12/01/2017) 60 tablet 0  . amphetamine-dextroamphetamine (ADDERALL) 20 MG tablet Take 1 tablet (20 mg total) by mouth 2 (two) times daily. May 2019 60 tablet 0   No facility-administered medications prior to visit.     Allergies  Allergen Reactions  . Peanut-Containing Drug Products Anaphylaxis  . Terazol [Terconazole]     Burning sensation    Review of Systems  Constitutional: Negative for fever and malaise/fatigue.  HENT: Negative for congestion.   Eyes: Negative for blurred vision.  Respiratory:  Negative for shortness of breath.   Cardiovascular: Positive for chest pain. Negative for palpitations and leg swelling.  Gastrointestinal: Negative for abdominal pain, blood in stool and nausea.  Genitourinary: Negative for dysuria and frequency.  Musculoskeletal: Positive for joint pain. Negative for falls.  Skin: Negative for rash.  Neurological: Negative for dizziness, loss of consciousness and headaches.  Endo/Heme/Allergies: Negative for environmental allergies.  Psychiatric/Behavioral: Negative for depression. The patient is nervous/anxious.        Objective:    Physical Exam  Constitutional: She is oriented to person, place, and time. She appears well-developed and well-nourished. No distress.  HENT:  Head: Normocephalic and atraumatic.  Nose: Nose normal.  Eyes: Right eye exhibits no discharge. Left eye exhibits no discharge.  Neck: Normal range of motion. Neck supple.  Cardiovascular: Normal rate and regular rhythm.  No murmur heard. Pulmonary/Chest: Effort normal and breath sounds normal.  Abdominal: Soft. Bowel sounds are normal. There is no tenderness.  Musculoskeletal: She exhibits no edema.  Neurological: She is alert and oriented to person, place, and time.  Skin: Skin is warm and dry.  Psychiatric: She has a normal mood and affect.  Nursing note and vitals reviewed.   BP 104/82 (BP Location: Right Arm, Patient Position: Sitting, Cuff Size: Normal)   Pulse 87   Temp 98 F (36.7 C)   Resp 18   Ht 5\' 2"  (1.575 m)   Wt 155 lb (70.3 kg)   SpO2 99%   BMI 28.35 kg/m  Wt Readings from Last 3 Encounters:  12/01/17 155 lb (70.3 kg)  06/02/17 153 lb 12.8 oz (69.8 kg)  11/27/16 152 lb (68.9 kg)     Lab Results  Component Value Date   WBC 7.5 06/02/2017   HGB 13.7 06/02/2017   HCT 41.0 06/02/2017   PLT 232.0 06/02/2017   GLUCOSE 91 07/02/2017   CHOL 256 (H) 06/02/2017   TRIG 269.0 (H) 06/02/2017   HDL 43.30 06/02/2017   LDLDIRECT 157.0 06/02/2017  LDLCALC 136 (H) 11/22/2012   ALT 14 07/02/2017   AST 14 07/02/2017   NA 136 07/02/2017   K 4.1 07/02/2017   CL 103 07/02/2017   CREATININE 0.85 07/02/2017   BUN 16 07/02/2017   CO2 24 07/02/2017   TSH 0.51 06/02/2017   INR 1.09 05/23/2015    Lab Results  Component Value Date   TSH 0.51 06/02/2017   Lab Results  Component Value Date   WBC 7.5 06/02/2017   HGB 13.7 06/02/2017   HCT 41.0 06/02/2017   MCV 93.0 06/02/2017   PLT 232.0 06/02/2017   Lab Results  Component Value Date   NA 136 07/02/2017   K 4.1 07/02/2017   CO2 24 07/02/2017   GLUCOSE 91 07/02/2017   BUN 16 07/02/2017   CREATININE 0.85 07/02/2017   BILITOT 0.4 07/02/2017   ALKPHOS 44 07/02/2017   AST 14 07/02/2017   ALT 14 07/02/2017   PROT 6.7 07/02/2017   ALBUMIN 3.9 07/02/2017   CALCIUM 9.0 07/02/2017   ANIONGAP 11 05/23/2015   GFR 78.13 07/02/2017   Lab Results  Component Value Date   CHOL 256 (H) 06/02/2017   Lab Results  Component Value Date   HDL 43.30 06/02/2017   Lab Results  Component Value Date   LDLCALC 136 (H) 11/22/2012   Lab Results  Component Value Date   TRIG 269.0 (H) 06/02/2017   Lab Results  Component Value Date   CHOLHDL 6 06/02/2017   No results found for: HGBA1C     Assessment & Plan:   Problem List Items Addressed This Visit    Hyperlipidemia    Encouraged heart healthy diet, increase exercise, avoid trans fats, consider a krill oil cap daily. Discussed meds but at this time patient declines      HTN (hypertension)    Well controlled, no changes to meds. Encouraged heart healthy diet such as the DASH diet and exercise as tolerated.       Poor concentration    Allowed refills on her Adderall is working well without concerning side effects.      Interstitial cystitis    Uses Hydrocodone during a flare will refill today      Hypothyroid    On Levothyroxine, continue to monitor      Atypical chest pain    This past Saturday she had an episode of  sudden, sharp chest pain while she was crying after her dog died. No associated symptoms other than some rapid breathing while she was upset. It past without any further episodes but she feels her chest is still mildly sore since then. Likely a combination of muscle spasm and anxiety. She will report if recurs or worsens. Her previous cardiac work up was unremarkable      Left shoulder pain    Is doing well but did have a flare. Hydrocodone is used with relief intermittently       Other Visit Diagnoses    Encounter for long-term (current) use of high-risk medication    -  Primary   Relevant Orders   Pain Mgmt, Profile 8 w/Conf, U      I have discontinued Romona M. Dea's HYDROcodone-acetaminophen. I am also having her start on amphetamine-dextroamphetamine, amphetamine-dextroamphetamine, amphetamine-dextroamphetamine, and HYDROcodone-acetaminophen. Additionally, I am having her maintain her pentosan polysulfate, SUMAtriptan, albuterol, cyclobenzaprine, meloxicam, fluconazole, amphetamine-dextroamphetamine, acyclovir, albuterol, budesonide-formoterol, levothyroxine, Norethindrone Acetate-Ethinyl Estrad-FE, lidocaine, nitrofurantoin (macrocrystal-monohydrate), and enalapril.  Meds ordered this encounter  Medications  . amphetamine-dextroamphetamine (ADDERALL XR) 20 MG 24  hr capsule    Sig: Take 1 capsule (20 mg total) by mouth every morning. February 2020    Dispense:  30 capsule    Refill:  0  . amphetamine-dextroamphetamine (ADDERALL XR) 20 MG 24 hr capsule    Sig: Take 1 capsule (20 mg total) by mouth every morning. Dec 2019    Dispense:  30 capsule    Refill:  0  . amphetamine-dextroamphetamine (ADDERALL XR) 20 MG 24 hr capsule    Sig: Take 1 capsule (20 mg total) by mouth daily. January 2020    Dispense:  30 capsule    Refill:  0  . HYDROcodone-acetaminophen (NORCO) 5-325 MG tablet    Sig: Take 1 tablet by mouth every 6 (six) hours as needed for moderate pain or severe pain.     Dispense:  90 tablet    Refill:  0     Danise Edge, MD

## 2017-12-01 NOTE — Assessment & Plan Note (Signed)
Encouraged heart healthy diet, increase exercise, avoid trans fats, consider a krill oil cap daily. Discussed meds but at this time patient declines

## 2017-12-01 NOTE — Assessment & Plan Note (Signed)
This past Saturday she had an episode of sudden, sharp chest pain while she was crying after her dog died. No associated symptoms other than some rapid breathing while she was upset. It past without any further episodes but she feels her chest is still mildly sore since then. Likely a combination of muscle spasm and anxiety. She will report if recurs or worsens. Her previous cardiac work up was unremarkable

## 2017-12-01 NOTE — Assessment & Plan Note (Signed)
Allowed refills on her Adderall is working well without concerning side effects.

## 2017-12-01 NOTE — Assessment & Plan Note (Signed)
Is doing well but did have a flare. Hydrocodone is used with relief intermittently

## 2017-12-01 NOTE — Assessment & Plan Note (Signed)
On Levothyroxine, continue to monitor 

## 2017-12-01 NOTE — Assessment & Plan Note (Signed)
Well controlled, no changes to meds. Encouraged heart healthy diet such as the DASH diet and exercise as tolerated.  °

## 2017-12-04 LAB — PAIN MGMT, PROFILE 8 W/CONF, U
6 Acetylmorphine: NEGATIVE ng/mL (ref <10)
AMPHETAMINE: 1025 ng/mL — AB (ref <250)
AMPHETAMINES: POSITIVE ng/mL — AB (ref <500)
Alcohol Metabolites: NEGATIVE ng/mL (ref <500)
Benzodiazepines: NEGATIVE ng/mL (ref <100)
Buprenorphine, Urine: NEGATIVE ng/mL (ref <5)
Cocaine Metabolite: NEGATIVE ng/mL (ref <150)
Creatinine: 164.9 mg/dL
MDMA: NEGATIVE ng/mL (ref <500)
METHAMPHETAMINE: NEGATIVE ng/mL (ref <250)
Marijuana Metabolite: NEGATIVE ng/mL (ref <20)
OPIATES: NEGATIVE ng/mL (ref <100)
OXYCODONE: NEGATIVE ng/mL (ref <100)
Oxidant: NEGATIVE ug/mL (ref <200)
pH: 7.15 (ref 4.5–9.0)

## 2017-12-12 ENCOUNTER — Other Ambulatory Visit: Payer: Self-pay | Admitting: Family Medicine

## 2017-12-22 ENCOUNTER — Other Ambulatory Visit: Payer: Self-pay

## 2017-12-22 MED ORDER — AMPHETAMINE-DEXTROAMPHETAMINE 20 MG PO TABS: 20.0000 mg | ORAL_TABLET | Freq: Two times a day (BID) | ORAL | 0 refills | Status: DC

## 2017-12-22 NOTE — Telephone Encounter (Signed)
Can you resend to patients pharmacy.. At her last visit the XR was sent in not the regular tablet. Patient is utd with everything

## 2017-12-22 NOTE — Telephone Encounter (Signed)
Copied from CRM [id number]. Topic: Quick Communication - Rx Refill/Question >> Dec 22, 2017  1:16 PM Gaynelle AduPoole, Shalonda wrote: Medication: amphetamine-dextroamphetamine (ADDERALL XR) 20 MG 24 hr capsule   Has the patient contacted their pharmacy? yes  Preferred Pharmacy (with phone number or street name): [id number] [place], [place] - [address] [phone] (Phone) [phone] (Fax)    Patient called in on 12-12-17 and stated the wrong medication  was sent over. Please advise the patient is completely out.

## 2017-12-24 NOTE — Telephone Encounter (Signed)
Done

## 2018-01-18 ENCOUNTER — Other Ambulatory Visit: Payer: Self-pay | Admitting: Family Medicine

## 2018-02-11 ENCOUNTER — Telehealth: Payer: Self-pay | Admitting: Family Medicine

## 2018-02-11 ENCOUNTER — Telehealth: Payer: Self-pay

## 2018-02-11 DIAGNOSIS — Z0279 Encounter for issue of other medical certificate: Secondary | ICD-10-CM

## 2018-02-11 NOTE — Telephone Encounter (Signed)
PA initiated via Covermymeds; KEY: [id number]. PA approved.

## 2018-02-11 NOTE — Telephone Encounter (Signed)
PA initiated by [facility] this am

## 2018-02-11 NOTE — Telephone Encounter (Signed)
Copied from CRM [id number]. Topic: Quick Communication - See Telephone Encounter >> Feb 11, 2018 11:14 AM Windy Kalata, NT wrote: CRM for notification. See Telephone encounter for: 02/11/18.  Patient is calling and states that she has been requesting a refill on HYDROcodone-acetaminophen (NORCO) 5-325 MG tablet for a month and they told her that she is needing a prior authorization for a year supply. She states the pharmacy always only gives her 7 pills. Please advise.  [id number] - [place], [place] - [address] AT [address] [facility] [address] [place] [address] Phone: [phone] Fax: [phone]

## 2018-02-12 NOTE — Telephone Encounter (Signed)
Effective 02/11/2018 to 08/10/2018.

## 2018-04-07 ENCOUNTER — Telehealth: Payer: Self-pay | Admitting: *Deleted

## 2018-04-07 ENCOUNTER — Other Ambulatory Visit: Payer: Self-pay | Admitting: Family Medicine
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
## 2018-04-07 NOTE — Telephone Encounter (Signed)
Received Physician Orders for OT Evaluation from [facility]; forwarded to provider/SLS 03/25

## 2018-04-08 MED ORDER — ALBUTEROL SULFATE (2.5 MG/3ML) 0.083% IN NEBU: 2.5000 mg | INHALATION_SOLUTION | Freq: Four times a day (QID) | RESPIRATORY_TRACT | 1 refills | Status: DC | PRN

## 2018-04-26 ENCOUNTER — Other Ambulatory Visit: Payer: Self-pay | Admitting: Family Medicine

## 2018-04-28 ENCOUNTER — Other Ambulatory Visit: Payer: Self-pay | Admitting: Family Medicine

## 2018-04-29 ENCOUNTER — Other Ambulatory Visit: Payer: Self-pay | Admitting: Family Medicine

## 2018-05-24 ENCOUNTER — Other Ambulatory Visit: Payer: Self-pay | Admitting: Family Medicine

## 2018-05-24 MED ORDER — AMPHETAMINE-DEXTROAMPHETAMINE 20 MG PO TABS: 20.0000 mg | ORAL_TABLET | Freq: Two times a day (BID) | ORAL | 0 refills | Status: DC

## 2018-05-30 ENCOUNTER — Other Ambulatory Visit: Payer: Self-pay | Admitting: Family Medicine

## 2018-06-08 ENCOUNTER — Ambulatory Visit (INDEPENDENT_AMBULATORY_CARE_PROVIDER_SITE_OTHER): Payer: 59 | Admitting: Family Medicine

## 2018-06-08 ENCOUNTER — Encounter: Payer: Self-pay | Admitting: Family Medicine

## 2018-06-08 ENCOUNTER — Other Ambulatory Visit: Payer: Self-pay

## 2018-06-08 DIAGNOSIS — M791 Myalgia, unspecified site: Secondary | ICD-10-CM | POA: Diagnosis not present

## 2018-06-08 DIAGNOSIS — Z309 Encounter for contraceptive management, unspecified: Secondary | ICD-10-CM

## 2018-06-08 DIAGNOSIS — E538 Deficiency of other specified B group vitamins: Secondary | ICD-10-CM

## 2018-06-08 DIAGNOSIS — E559 Vitamin D deficiency, unspecified: Secondary | ICD-10-CM

## 2018-06-08 DIAGNOSIS — I10 Essential (primary) hypertension: Secondary | ICD-10-CM

## 2018-06-08 DIAGNOSIS — E039 Hypothyroidism, unspecified: Secondary | ICD-10-CM

## 2018-06-08 DIAGNOSIS — E782 Mixed hyperlipidemia: Secondary | ICD-10-CM

## 2018-06-08 DIAGNOSIS — R41840 Attention and concentration deficit: Secondary | ICD-10-CM

## 2018-06-08 HISTORY — DX: Myalgia, unspecified site: M79.10

## 2018-06-08 MED ORDER — HYDROCODONE-ACETAMINOPHEN 5-325 MG PO TABS: 1.0000 | ORAL_TABLET | Freq: Four times a day (QID) | ORAL | 0 refills | Status: DC | PRN

## 2018-06-08 MED ORDER — NITROFURANTOIN MONOHYD MACRO 100 MG PO CAPS: ORAL_CAPSULE | ORAL | 0 refills | Status: DC

## 2018-06-08 MED ORDER — AMPHETAMINE-DEXTROAMPHETAMINE 20 MG PO TABS: 20.0000 mg | ORAL_TABLET | Freq: Two times a day (BID) | ORAL | 0 refills | Status: DC

## 2018-06-08 MED ORDER — ENALAPRIL MALEATE 5 MG PO TABS: 5.0000 mg | ORAL_TABLET | Freq: Two times a day (BID) | ORAL | 5 refills | Status: DC

## 2018-06-08 MED ORDER — ACYCLOVIR 200 MG PO CAPS: ORAL_CAPSULE | ORAL | 3 refills | Status: DC

## 2018-06-08 MED ORDER — NORETHIN ACE-ETH ESTRAD-FE 1-20 MG-MCG(24) PO TABS: 1.0000 | ORAL_TABLET | Freq: Every day | ORAL | 11 refills | Status: DC

## 2018-06-08 NOTE — Assessment & Plan Note (Signed)
She is following with GYN and continues to struggle with pelvic pain and they are considering a hysterectomy. She has noted a recent increase in sunburns while outside since her OCP generic was changed. May try and return to previous version. Will request records from her GYN Dr Juliene Pina who performed a pap and MGM recently both of which were normal per patient

## 2018-06-08 NOTE — Assessment & Plan Note (Signed)
Refills given on medications, she is tolerating well

## 2018-06-08 NOTE — Assessment & Plan Note (Signed)
On Levothyroxine, continue to monitor 

## 2018-06-08 NOTE — Assessment & Plan Note (Signed)
Supplement and monitor 

## 2018-06-08 NOTE — Assessment & Plan Note (Signed)
Check magnesium. 

## 2018-06-08 NOTE — Progress Notes (Signed)
Virtual Visit via Video Note  I connected with Kristen [name] on 06/08/18 at  9:40 AM EDT by a video enabled telemedicine application and verified that I am speaking with the correct person using two identifiers.  Location: Patient: home Provider: home   I discussed the limitations of evaluation and management by telemedicine and the availability of in person appointments. The patient expressed understanding and agreed to proceed. Kristen [name], CMA was able to get the patient set up on the video platform.    Subjective:    Patient ID: Kristen [name], female    DOB: [birth year], 43 y.o.   MRN: [id number]  No chief complaint on file.   HPI Patient is in today for follow up on chronic medical concerns including elevated blood pressure, hyperlipidemia, vitamin B12 deficiency and vitamin D deficiency. No recent febrile illness or hospitalizations. Notes her blood pressure elevated some when out of magnesium but is normal now. Denies CP/palp/SOB/HA/congestion/fevers/GI or GU c/o. Taking meds as prescribed  Past Medical History:  Diagnosis Date  . Abnormal cervical cytology 09/26/2011   LGSIL in 2011, patient reports repeat pap was normal, has had intermittent abnormals with bx in past but always normalized, has had cryotherapy with good results Menarche at 17, irregular without birth control at times, very heavy LMP 08/26/2011 No gyn concerns, MGM last year normal   . ADD (attention deficit disorder)   . Allergy    seasonal  . Anemia    blood loss  . Asthma    mild, intermittent  . Bronchitis, acute 12/16/2010  . Chest pain, atypical 10/08/2010  . Chicken pox as a child  . Concussion 09/24/2010  . Contraceptive management 02/17/2011  . Decreased libido 09/26/2011  . Depression 2002   post partum  . Family history of heart disease   . Female bladder prolapse, acquired 09/24/2010  . Fibromyalgia   . Great toe pain, right 06/02/2017  . Headache 09/26/2013  . History of concussion 09/24/2010  .  History of migraines 09/24/2010  . 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 HTN (hypertension) 09/24/2010  . Hyperlipidemia   . Hyperlipidemia   . Hypertension   . Interstitial cystitis 02/18/2011  . Left shoulder pain 05/27/2016  . Lymphadenopathy 09/24/2010  . Migraine 09/24/2010  . Migraine aura, persistent, with cerebral infarct, status over 72 hours 09/24/2010  . Myalgia 06/08/2018  . Neck pain, musculoskeletal 12/16/2010  . Overweight 05/29/2015  . Overweight(278.02) 12/26/2011  . Pharyngitis 05/27/2016  . Poor concentration 12/16/2010  . Preventative health care 11/28/2012  . Recurrent UTI   . Renal lithiasis 09/24/2010  . RUQ pain 05/27/2011  . Subacute and chronic vaginitis 09/24/2010  . Thyroid disease   . Tick bite 12/04/2013  . Transient left leg weakness 05/21/2014  . Vaginitis and vulvovaginitis 05/21/2014    Past Surgical History:  Procedure Laterality Date  . INCONTINENCE SURGERY  2003    Family History  Problem Relation Age of Onset  . Heart attack Father   . Hyperlipidemia Father   . Hypertension Father   . Alcohol abuse Father   . Heart disease Father   . Heart attack Brother 45  . Hypertension Brother   . Heart disease Brother 45       MI  . Hyperlipidemia Brother   . Dementia Maternal Grandfather 28       early stages  . Stroke Maternal Grandfather   . Heart attack Paternal Grandfather   . Stroke Paternal Grandfather   . Heart disease Paternal  Grandfather   . Alcohol abuse Paternal Grandfather   . Hyperlipidemia Paternal Grandfather   . Hypertension Paternal Grandfather   . Osteoporosis Mother   . Hyperlipidemia Mother   . Heart disease Maternal Grandmother   . Heart failure Maternal Grandmother     Social History   Socioeconomic History  . Marital status: Married    Spouse name: Not on file  . Number of children: Not on file  . Years of education: Not on file  . Highest education level: Not on file  Occupational History  . Not on file  Social Needs  .  Financial resource strain: Not on file  . Food insecurity:    Worry: Not on file    Inability: Not on file  . Transportation needs:    Medical: Not on file    Non-medical: Not on file  Tobacco Use  . Smoking status: Never Smoker  . Smokeless tobacco: Never Used  Substance and Sexual Activity  . Alcohol use: No    Alcohol/week: 0.0 standard drinks    Comment: 0  . Drug use: No  . Sexual activity: Yes    Partners: Male    Birth control/protection: Pill  Lifestyle  . Physical activity:    Days per week: Not on file    Minutes per session: Not on file  . Stress: Not on file  Relationships  . Social connections:    Talks on phone: Not on file    Gets together: Not on file    Attends religious service: Not on file    Active member of club or organization: Not on file    Attends meetings of clubs or organizations: Not on file    Relationship status: Not on file  . Intimate partner violence:    Fear of current or ex partner: Not on file    Emotionally abused: Not on file    Physically abused: Not on file    Forced sexual activity: Not on file  Other Topics Concern  . Not on file  Social History Narrative   Lives with husband 2 children in a one story home.     Works as a Teacher, adult education.     Education: college.    Outpatient Medications Prior to Visit  Medication Sig Dispense Refill  . albuterol (PROVENTIL HFA;VENTOLIN HFA) 108 (90 Base) MCG/ACT inhaler Inhale 1 puff into the lungs every 4 (four) hours as needed for wheezing. 1-2 puffs po bid prn asthma, as directed by pulmonolgy 1 Inhaler 6  . albuterol (PROVENTIL) (2.5 MG/3ML) 0.083% nebulizer solution Take 3 mLs (2.5 mg total) by nebulization every 6 (six) hours as needed for wheezing or shortness of breath. 150 mL 1  . cyclobenzaprine (FLEXERIL) 10 MG tablet Take 1 tablet (10 mg total) by mouth at bedtime. 90 tablet 3  . fluconazole (DIFLUCAN) 150 MG tablet TAKE 1 TABLET ONCE, IF NEEDED. TAKE AFTER COMPLETION OF  ANTIBIOTIC RX. 12 tablet 1  . lidocaine (XYLOCAINE) 2 % jelly APPLY TO AFFECTED AREA DAILY AS NEEDED 20 mL 1  . meloxicam (MOBIC) 15 MG tablet TAKE 1 TABLET BY MOUTH EVERY DAY AS NEEDED FOR PAIN 30 tablet 3  . pentosan polysulfate (ELMIRON) 100 MG capsule Take 1 capsule (100 mg total) by mouth 3 (three) times daily before meals. 90 capsule 5  . SUMAtriptan (IMITREX) 50 MG tablet Take 1 tablet (50 mg total) by mouth every 2 (two) hours as needed for migraine. May repeat in 2 hours if headache  persists or recurs. 10 tablet 5  . SYMBICORT 160-4.5 MCG/ACT inhaler TAKE 2 PUFFS BY MOUTH TWICE A DAY 10.2 Inhaler 4  . SYNTHROID 137 MCG tablet TAKE 1 TABLET BY MOUTH EVERY DAY 30 tablet 2  . acyclovir (ZOVIRAX) 200 MG capsule TAKE 1 CAPSULE (200 MG TOTAL) BY MOUTH 2 (TWO) TIMES DAILY. 180 capsule 3  . amphetamine-dextroamphetamine (ADDERALL) 20 MG tablet Take 1 tablet (20 mg total) by mouth 2 (two) times daily. January 2020 60 tablet 0  . amphetamine-dextroamphetamine (ADDERALL) 20 MG tablet Take 1 tablet (20 mg total) by mouth 2 (two) times daily. December 2019 60 tablet 0  . amphetamine-dextroamphetamine (ADDERALL) 20 MG tablet Take 1 tablet (20 mg total) by mouth 2 (two) times daily. May 2020 60 tablet 0  . BLISOVI 24 FE 1-20 MG-MCG(24) tablet TAKE 1 TABLET BY MOUTH EVERY DAY 28 tablet 11  . enalapril (VASOTEC) 5 MG tablet TAKE 1 TABLET BY MOUTH TWICE A DAY 60 tablet 5  . HYDROcodone-acetaminophen (NORCO) 5-325 MG tablet Take 1 tablet by mouth every 6 (six) hours as needed for moderate pain or severe pain. 90 tablet 0  . nitrofurantoin, macrocrystal-monohydrate, (MACROBID) 100 MG capsule TAKE 1 CAPSULE FOR 1 DOSE AS NEEDED FOR PREVENTION OF UTI *AFTER SELF CATH, SWIMMING, HOT TUB, SEX* 90 capsule 0   No facility-administered medications prior to visit.     Allergies  Allergen Reactions  . Peanut-Containing Drug Products Anaphylaxis  . Terazol [Terconazole]     Burning sensation    Review of  Systems  Constitutional: Negative for fever and malaise/fatigue.  HENT: Negative for congestion.   Eyes: Negative for blurred vision.  Respiratory: Negative for shortness of breath.   Cardiovascular: Negative for chest pain, palpitations and leg swelling.  Gastrointestinal: Negative for abdominal pain, blood in stool and nausea.  Genitourinary: Negative for dysuria and frequency.  Musculoskeletal: Negative for falls.  Skin: Negative for rash.  Neurological: Negative for dizziness, loss of consciousness and headaches.  Endo/Heme/Allergies: Negative for environmental allergies.  Psychiatric/Behavioral: Negative for depression. The patient is not nervous/anxious.        Objective:    Physical Exam Constitutional:      Appearance: Normal appearance. She is not ill-appearing.  HENT:     Head: Normocephalic and atraumatic.     Nose: Nose normal.  Eyes:     General:        Right eye: No discharge.        Left eye: No discharge.  Pulmonary:     Effort: Pulmonary effort is normal.  Neurological:     Mental Status: She is alert and oriented to person, place, and time.  Psychiatric:        Mood and Affect: Mood normal.        Behavior: Behavior normal.     There were no vitals taken for this visit. Wt Readings from Last 3 Encounters:  12/01/17 155 lb (70.3 kg)  06/02/17 153 lb 12.8 oz (69.8 kg)  11/27/16 152 lb (68.9 kg)    Diabetic Foot Exam - Simple   No data filed     Lab Results  Component Value Date   WBC 7.5 06/02/2017   HGB 13.7 06/02/2017   HCT 41.0 06/02/2017   PLT 232.0 06/02/2017   GLUCOSE 91 07/02/2017   CHOL 256 (H) 06/02/2017   TRIG 269.0 (H) 06/02/2017   HDL 43.30 06/02/2017   LDLDIRECT 157.0 06/02/2017   LDLCALC 136 (H) 11/22/2012   ALT 14 07/02/2017  AST 14 07/02/2017   NA 136 07/02/2017   K 4.1 07/02/2017   CL 103 07/02/2017   CREATININE 0.85 07/02/2017   BUN 16 07/02/2017   CO2 24 07/02/2017   TSH 0.51 06/02/2017   INR 1.09 05/23/2015     Lab Results  Component Value Date   TSH 0.51 06/02/2017   Lab Results  Component Value Date   WBC 7.5 06/02/2017   HGB 13.7 06/02/2017   HCT 41.0 06/02/2017   MCV 93.0 06/02/2017   PLT 232.0 06/02/2017   Lab Results  Component Value Date   NA 136 07/02/2017   K 4.1 07/02/2017   CO2 24 07/02/2017   GLUCOSE 91 07/02/2017   BUN 16 07/02/2017   CREATININE 0.85 07/02/2017   BILITOT 0.4 07/02/2017   ALKPHOS 44 07/02/2017   AST 14 07/02/2017   ALT 14 07/02/2017   PROT 6.7 07/02/2017   ALBUMIN 3.9 07/02/2017   CALCIUM 9.0 07/02/2017   ANIONGAP 11 05/23/2015   GFR 78.13 07/02/2017   Lab Results  Component Value Date   CHOL 256 (H) 06/02/2017   Lab Results  Component Value Date   HDL 43.30 06/02/2017   Lab Results  Component Value Date   LDLCALC 136 (H) 11/22/2012   Lab Results  Component Value Date   TRIG 269.0 (H) 06/02/2017   Lab Results  Component Value Date   CHOLHDL 6 06/02/2017   No results found for: HGBA1C     Assessment & Plan:   Problem List Items Addressed This Visit    Hyperlipidemia   Relevant Medications   enalapril (VASOTEC) 5 MG tablet   Other Relevant Orders   Lipid panel   HTN (hypertension)    no changes to meds. Encouraged heart healthy diet such as the DASH diet and exercise as tolerated. She noted a couple of spikes when she ran out of Magnesium but is well control again back on it.       Relevant Medications   enalapril (VASOTEC) 5 MG tablet   Other Relevant Orders   CBC   Comprehensive metabolic panel   TSH   Poor concentration    Refills given on medications, she is tolerating well      Contraceptive management    She is following with GYN and continues to struggle with pelvic pain and they are considering a hysterectomy. She has noted a recent increase in sunburns while outside since her OCP generic was changed. May try and return to previous version. Will request records from her GYN Dr Juliene Pina who performed a pap and MGM  recently both of which were normal per patient      Hypothyroid    On Levothyroxine, continue to monitor      Vitamin B12 deficiency    Supplement and monitor      Relevant Orders   Vitamin B12   Myalgia    Check magnesium      Relevant Orders   Magnesium   Vitamin D deficiency - Primary    Supplement and monitor      Relevant Orders   VITAMIN D 25 Hydroxy (Vit-D Deficiency, Fractures)      I have changed Fayetta M. Sedberry's Blisovi 24 Fe to Norethindrone Acetate-Ethinyl Estrad-FE. I have also changed her amphetamine-dextroamphetamine, amphetamine-dextroamphetamine, amphetamine-dextroamphetamine, and enalapril. I am also having her maintain her pentosan polysulfate, SUMAtriptan, cyclobenzaprine, meloxicam, fluconazole, albuterol, lidocaine, albuterol, Synthroid, Symbicort, acyclovir, HYDROcodone-acetaminophen, and nitrofurantoin (macrocrystal-monohydrate).  Meds ordered this encounter  Medications  . amphetamine-dextroamphetamine (ADDERALL)  20 MG tablet    Sig: Take 1 tablet (20 mg total) by mouth 2 (two) times daily. August 2020    Dispense:  60 tablet    Refill:  0  . amphetamine-dextroamphetamine (ADDERALL) 20 MG tablet    Sig: Take 1 tablet (20 mg total) by mouth 2 (two) times daily. July 2020    Dispense:  60 tablet    Refill:  0  . amphetamine-dextroamphetamine (ADDERALL) 20 MG tablet    Sig: Take 1 tablet (20 mg total) by mouth 2 (two) times daily. June 2020    Dispense:  60 tablet    Refill:  0  . acyclovir (ZOVIRAX) 200 MG capsule    Sig: TAKE 1 CAPSULE (200 MG TOTAL) BY MOUTH 2 (TWO) TIMES DAILY.    Dispense:  180 capsule    Refill:  3  . HYDROcodone-acetaminophen (NORCO) 5-325 MG tablet    Sig: Take 1 tablet by mouth every 6 (six) hours as needed for moderate pain or severe pain.    Dispense:  90 tablet    Refill:  0  . Norethindrone Acetate-Ethinyl Estrad-FE (BLISOVI 24 FE) 1-20 MG-MCG(24) tablet    Sig: Take 1 tablet by mouth daily.    Dispense:  28 tablet     Refill:  11  . nitrofurantoin, macrocrystal-monohydrate, (MACROBID) 100 MG capsule    Sig: TAKE 1 CAPSULE FOR 1 DOSE AS NEEDED FOR PREVENTION OF UTI *AFTER SELF CATH, SWIMMING, HOT TUB, SEX*    Dispense:  90 capsule    Refill:  0  . enalapril (VASOTEC) 5 MG tablet    Sig: Take 1 tablet (5 mg total) by mouth 2 (two) times daily.    Dispense:  60 tablet    Refill:  5     I discussed the assessment and treatment plan with the patient. The patient was provided an opportunity to ask questions and all were answered. The patient agreed with the plan and demonstrated an understanding of the instructions.   The patient was advised to call back or seek an in-person evaluation if the symptoms worsen or if the condition fails to improve as anticipated.  I provided 20 minutes of non-face-to-face time during this encounter.   Danise Edge, MD

## 2018-06-08 NOTE — Assessment & Plan Note (Addendum)
no changes to meds. Encouraged heart healthy diet such as the DASH diet and exercise as tolerated. She noted a couple of spikes when she ran out of Magnesium but is well control again back on it.

## 2018-06-10 ENCOUNTER — Other Ambulatory Visit: Payer: Self-pay | Admitting: Family Medicine

## 2018-06-16 ENCOUNTER — Other Ambulatory Visit: Payer: 59

## 2018-06-21 ENCOUNTER — Other Ambulatory Visit: Payer: Self-pay

## 2018-06-21 ENCOUNTER — Other Ambulatory Visit (INDEPENDENT_AMBULATORY_CARE_PROVIDER_SITE_OTHER): Payer: 59

## 2018-06-21 DIAGNOSIS — I10 Essential (primary) hypertension: Secondary | ICD-10-CM | POA: Diagnosis not present

## 2018-06-21 DIAGNOSIS — M791 Myalgia, unspecified site: Secondary | ICD-10-CM

## 2018-06-21 DIAGNOSIS — E559 Vitamin D deficiency, unspecified: Secondary | ICD-10-CM | POA: Diagnosis not present

## 2018-06-21 DIAGNOSIS — E538 Deficiency of other specified B group vitamins: Secondary | ICD-10-CM

## 2018-06-21 DIAGNOSIS — E782 Mixed hyperlipidemia: Secondary | ICD-10-CM | POA: Diagnosis not present

## 2018-06-21 LAB — COMPREHENSIVE METABOLIC PANEL
ALT: 10 U/L (ref 0–35)
AST: 14 U/L (ref 0–37)
Albumin: 4 g/dL (ref 3.5–5.2)
Alkaline Phosphatase: 44 U/L (ref 39–117)
BUN: 15 mg/dL (ref 6–23)
CO2: 26 mEq/L (ref 19–32)
Calcium: 9 mg/dL (ref 8.4–10.5)
Chloride: 103 mEq/L (ref 96–112)
Creatinine, Ser: 0.8 mg/dL (ref 0.40–1.20)
GFR: 78.47 mL/min (ref >60.00)
Glucose, Bld: 85 mg/dL (ref 70–99)
Potassium: 4.3 mEq/L (ref 3.5–5.1)
Sodium: 137 mEq/L (ref 135–145)
Total Bilirubin: 0.5 mg/dL (ref 0.2–1.2)
Total Protein: 6.5 g/dL (ref 6.0–8.3)

## 2018-06-21 LAB — LIPID PANEL
Cholesterol: 266 mg/dL — ABNORMAL HIGH (ref 0–200)
HDL: 41.6 mg/dL (ref >39.00)
NonHDL: 224.69
Total CHOL/HDL Ratio: 6
Triglycerides: 362 mg/dL — ABNORMAL HIGH (ref 0.0–149.0)
VLDL: 72.4 mg/dL — ABNORMAL HIGH (ref 0.0–40.0)

## 2018-06-21 LAB — CBC
HCT: 40.5 % (ref 36.0–46.0)
Hemoglobin: 13.8 g/dL (ref 12.0–15.0)
MCHC: 34 g/dL (ref 30.0–36.0)
MCV: 92.5 fl (ref 78.0–100.0)
Platelets: 210 10*3/uL (ref 150.0–400.0)
RBC: 4.38 Mil/uL (ref 3.87–5.11)
RDW: 12.6 % (ref 11.5–15.5)
WBC: 8.9 10*3/uL (ref 4.0–10.5)

## 2018-06-21 LAB — TSH: TSH: 2.3 u[IU]/mL (ref 0.35–4.50)

## 2018-06-21 LAB — LDL CHOLESTEROL, DIRECT: Direct LDL: 162 mg/dL

## 2018-06-21 LAB — MAGNESIUM: Magnesium: 2 mg/dL (ref 1.5–2.5)

## 2018-06-21 LAB — VITAMIN D 25 HYDROXY (VIT D DEFICIENCY, FRACTURES): VITD: 43.72 ng/mL (ref 30.00–100.00)

## 2018-06-21 LAB — VITAMIN B12: Vitamin B-12: 136 pg/mL — ABNORMAL LOW (ref 211–911)

## 2018-08-23 ENCOUNTER — Other Ambulatory Visit: Payer: Self-pay | Admitting: Family Medicine

## 2018-10-13 ENCOUNTER — Other Ambulatory Visit: Payer: Self-pay | Admitting: Family Medicine

## 2018-10-13 ENCOUNTER — Encounter: Payer: Self-pay | Admitting: Family Medicine

## 2018-10-13 MED ORDER — AMOXICILLIN 500 MG PO CAPS: 500.0000 mg | ORAL_CAPSULE | Freq: Three times a day (TID) | ORAL | 0 refills | Status: DC

## 2018-10-13 NOTE — Telephone Encounter (Signed)
Sent message to patient to schedule virtual visit.

## 2018-10-15 NOTE — Telephone Encounter (Signed)
Patient notified of the medication and will call back to rs VV

## 2018-10-24 ENCOUNTER — Other Ambulatory Visit: Payer: Self-pay | Admitting: Family Medicine

## 2018-10-26 ENCOUNTER — Other Ambulatory Visit: Payer: Self-pay | Admitting: Family Medicine

## 2018-10-26 MED ORDER — AMPHETAMINE-DEXTROAMPHETAMINE 20 MG PO TABS: 20.0000 mg | ORAL_TABLET | Freq: Two times a day (BID) | ORAL | 0 refills | Status: DC

## 2018-10-26 NOTE — Telephone Encounter (Signed)
Medication Refill - Medication: amphetamine-dextroamphetamine (ADDERALL) 20 MG tablet  Has the patient contacted their pharmacy? Yes - states that she needed to call PCP (Agent: If no, request that the patient contact the pharmacy for the refill.) (Agent: If yes, when and what did the pharmacy advise?)  Preferred Pharmacy (with phone number or street name):  [id number] - [place], [place]. AT [address] [phone] (Phone) [phone] (Fax)   Agent: Please be advised that RX refills may take up to 3 business days. We ask that you follow-up with your pharmacy.

## 2018-10-26 NOTE — Telephone Encounter (Signed)
Requested medication (s) are due for refill today: yes  Requested medication (s) are on the active medication list:yes  Last refill:  06/08/2018  Future visit scheduled: yes  Notes to clinic:  Refill cannot be delegated    Requested Prescriptions  Pending Prescriptions Disp Refills   amphetamine-dextroamphetamine (ADDERALL) 20 MG tablet 60 tablet 0    Sig: Take 1 tablet (20 mg total) by mouth 2 (two) times daily. August 2020     Not Delegated - Psychiatry:  Stimulants/ADHD Failed - 10/26/2018 11:21 AM      Failed - This refill cannot be delegated      Failed - Valid encounter within last 3 months    Recent Outpatient Visits          4 months ago Vitamin D deficiency   [facility] [place], [facility]   10 months ago Encounter for long-term (current) use of high-risk medication   [facility] [place], MD   1 year ago Preventative health care   [facility] [place], [place]   1 year ago Rib pain on left side   [facility] [place], MD   2 years ago Preventative health care   [facility] [place], MD      Future Appointments            In 1 month Mosie Lukes, MD [place] [facility] [place] completed in last 360 days.

## 2018-12-03 ENCOUNTER — Other Ambulatory Visit: Payer: Self-pay | Admitting: Family Medicine

## 2018-12-07 ENCOUNTER — Ambulatory Visit (INDEPENDENT_AMBULATORY_CARE_PROVIDER_SITE_OTHER): Payer: 59 | Admitting: Family Medicine

## 2018-12-07 ENCOUNTER — Other Ambulatory Visit: Payer: Self-pay

## 2018-12-07 VITALS — BP 130/76 | HR 85 | Temp 98.3°F | Resp 18 | Wt 161.6 lb

## 2018-12-07 DIAGNOSIS — L578 Other skin changes due to chronic exposure to nonionizing radiation: Secondary | ICD-10-CM

## 2018-12-07 DIAGNOSIS — E538 Deficiency of other specified B group vitamins: Secondary | ICD-10-CM | POA: Diagnosis not present

## 2018-12-07 DIAGNOSIS — Z86010 Personal history of colonic polyps: Secondary | ICD-10-CM

## 2018-12-07 DIAGNOSIS — K5730 Diverticulosis of large intestine without perforation or abscess without bleeding: Secondary | ICD-10-CM

## 2018-12-07 DIAGNOSIS — J4520 Mild intermittent asthma, uncomplicated: Secondary | ICD-10-CM | POA: Diagnosis not present

## 2018-12-07 DIAGNOSIS — Z0000 Encounter for general adult medical examination without abnormal findings: Secondary | ICD-10-CM

## 2018-12-07 DIAGNOSIS — E039 Hypothyroidism, unspecified: Secondary | ICD-10-CM

## 2018-12-07 DIAGNOSIS — E559 Vitamin D deficiency, unspecified: Secondary | ICD-10-CM

## 2018-12-07 DIAGNOSIS — D649 Anemia, unspecified: Secondary | ICD-10-CM | POA: Diagnosis not present

## 2018-12-07 DIAGNOSIS — I10 Essential (primary) hypertension: Secondary | ICD-10-CM

## 2018-12-07 DIAGNOSIS — E782 Mixed hyperlipidemia: Secondary | ICD-10-CM | POA: Diagnosis not present

## 2018-12-07 DIAGNOSIS — R1012 Left upper quadrant pain: Secondary | ICD-10-CM

## 2018-12-07 DIAGNOSIS — R41840 Attention and concentration deficit: Secondary | ICD-10-CM

## 2018-12-07 DIAGNOSIS — T7840XD Allergy, unspecified, subsequent encounter: Secondary | ICD-10-CM

## 2018-12-07 LAB — LIPID PANEL
Cholesterol: 235 mg/dL — ABNORMAL HIGH (ref 0–200)
HDL: 39.4 mg/dL (ref >39.00)
NonHDL: 195.61
Total CHOL/HDL Ratio: 6
Triglycerides: 248 mg/dL — ABNORMAL HIGH (ref 0.0–149.0)
VLDL: 49.6 mg/dL — ABNORMAL HIGH (ref 0.0–40.0)

## 2018-12-07 LAB — COMPREHENSIVE METABOLIC PANEL
ALT: 9 U/L (ref 0–35)
AST: 13 U/L (ref 0–37)
Albumin: 3.8 g/dL (ref 3.5–5.2)
Alkaline Phosphatase: 42 U/L (ref 39–117)
BUN: 12 mg/dL (ref 6–23)
CO2: 27 mEq/L (ref 19–32)
Calcium: 8.9 mg/dL (ref 8.4–10.5)
Chloride: 105 mEq/L (ref 96–112)
Creatinine, Ser: 0.7 mg/dL (ref 0.40–1.20)
GFR: 91.35 mL/min (ref >60.00)
Glucose, Bld: 87 mg/dL (ref 70–99)
Potassium: 4.5 mEq/L (ref 3.5–5.1)
Sodium: 137 mEq/L (ref 135–145)
Total Bilirubin: 0.4 mg/dL (ref 0.2–1.2)
Total Protein: 6.5 g/dL (ref 6.0–8.3)

## 2018-12-07 LAB — LDL CHOLESTEROL, DIRECT: Direct LDL: 134 mg/dL

## 2018-12-07 LAB — CBC
HCT: 40.6 % (ref 36.0–46.0)
Hemoglobin: 13.6 g/dL (ref 12.0–15.0)
MCHC: 33.5 g/dL (ref 30.0–36.0)
MCV: 92 fl (ref 78.0–100.0)
Platelets: 223 10*3/uL (ref 150.0–400.0)
RBC: 4.42 Mil/uL (ref 3.87–5.11)
RDW: 13.3 % (ref 11.5–15.5)
WBC: 7 10*3/uL (ref 4.0–10.5)

## 2018-12-07 LAB — VITAMIN B12: Vitamin B-12: 176 pg/mL — ABNORMAL LOW (ref 211–911)

## 2018-12-07 LAB — TSH: TSH: 0.53 u[IU]/mL (ref 0.35–4.50)

## 2018-12-07 LAB — VITAMIN D 25 HYDROXY (VIT D DEFICIENCY, FRACTURES): VITD: 33.52 ng/mL (ref 30.00–100.00)

## 2018-12-07 NOTE — Progress Notes (Signed)
Subjective:    Patient ID: Kristen [name], female    DOB: 21-Jul-1975, 43 y.o.   MRN: [id number]  No chief complaint on file.   HPI Patient is in today for annual preventative exam and follow up on chronic medical concerns including asthma, hyperlipidemia, ADD, hypothyroidism and more. No recent febrile illness or hospitalizations. She believes she may have had COVID but she did not get overwhelmingly ill so she questions if the pandemic is really so serious. She feels well now. She maintains a heart healthy diet and stays active. She is struggling with recurrent swelling of her eyes and facial pressure. She is noting some left sided abdominal pain at times and changes in bowel habits. Denies CP/palp/SOB/HA/fevers or GU c/o. Taking meds as prescribed  Past Medical History:  Diagnosis Date  . Abnormal cervical cytology 09/26/2011   LGSIL in 2011, patient reports repeat pap was normal, has had intermittent abnormals with bx in past but always normalized, has had cryotherapy with good results Menarche at 6, irregular without birth control at times, very heavy LMP 08/26/2011 No gyn concerns, MGM last year normal   . ADD (attention deficit disorder)   . Allergy    seasonal  . Anemia    blood loss  . Asthma    mild, intermittent  . Bronchitis, acute 12/16/2010  . Chest pain, atypical 10/08/2010  . Chicken pox as a child  . Concussion 09/24/2010  . Contraceptive management 02/17/2011  . Decreased libido 09/26/2011  . Depression 2002   post partum  . Family history of heart disease   . Female bladder prolapse, acquired 09/24/2010  . Fibromyalgia   . Great toe pain, right 06/02/2017  . Headache 09/26/2013  . History of concussion 09/24/2010  . History of migraines 09/24/2010  . 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 HTN (hypertension) 09/24/2010  . Hyperlipidemia   . Hyperlipidemia   . Hypertension   . Interstitial cystitis 02/18/2011  . Left shoulder pain 05/27/2016  . Lymphadenopathy 09/24/2010  . Migraine  09/24/2010  . Migraine aura, persistent, with cerebral infarct, status over 72 hours 09/24/2010  . Myalgia 06/08/2018  . Neck pain, musculoskeletal 12/16/2010  . Overweight 05/29/2015  . Overweight(278.02) 12/26/2011  . Pharyngitis 05/27/2016  . Poor concentration 12/16/2010  . Preventative health care 11/28/2012  . Recurrent UTI   . Renal lithiasis 09/24/2010  . RUQ pain 05/27/2011  . Subacute and chronic vaginitis 09/24/2010  . Thyroid disease   . Tick bite 12/04/2013  . Transient left leg weakness 05/21/2014  . Vaginitis and vulvovaginitis 05/21/2014    Past Surgical History:  Procedure Laterality Date  . INCONTINENCE SURGERY  2003    Family History  Problem Relation Age of Onset  . Heart attack Father   . Hyperlipidemia Father   . Hypertension Father   . Alcohol abuse Father   . Heart disease Father   . Heart attack Brother 80  . Hypertension Brother   . Heart disease Brother 5       MI  . Hyperlipidemia Brother   . Dementia Maternal Grandfather 25       early stages  . Stroke Maternal Grandfather   . Heart attack Paternal Grandfather   . Stroke Paternal Grandfather   . Heart disease Paternal Grandfather   . Alcohol abuse Paternal Grandfather   . Hyperlipidemia Paternal Grandfather   . Hypertension Paternal Grandfather   . Osteoporosis Mother   . Hyperlipidemia Mother   . Heart disease Maternal Grandmother   .  Heart failure Maternal Grandmother     Social History   Socioeconomic History  . Marital status: Married    Spouse name: Not on file  . Number of children: Not on file  . Years of education: Not on file  . Highest education level: Not on file  Occupational History  . Not on file  Social Needs  . Financial resource strain: Not on file  . Food insecurity    Worry: Not on file    Inability: Not on file  . Transportation needs    Medical: Not on file    Non-medical: Not on file  Tobacco Use  . Smoking status: Never Smoker  . Smokeless tobacco: Never  Used  Substance and Sexual Activity  . Alcohol use: No    Alcohol/week: 0.0 standard drinks    Comment: 0  . Drug use: No  . Sexual activity: Yes    Partners: Male    Birth control/protection: Pill  Lifestyle  . Physical activity    Days per week: Not on file    Minutes per session: Not on file  . Stress: Not on file  Relationships  . Social Musician on phone: Not on file    Gets together: Not on file    Attends religious service: Not on file    Active member of club or organization: Not on file    Attends meetings of clubs or organizations: Not on file    Relationship status: Not on file  . Intimate partner violence    Fear of current or ex partner: Not on file    Emotionally abused: Not on file    Physically abused: Not on file    Forced sexual activity: Not on file  Other Topics Concern  . Not on file  Social History Narrative   Lives with husband 2 children in a one story home.     Works as a Teacher, adult education.     Education: college.    Outpatient Medications Prior to Visit  Medication Sig Dispense Refill  . acyclovir (ZOVIRAX) 200 MG capsule TAKE 1 CAPSULE (200 MG TOTAL) BY MOUTH 2 (TWO) TIMES DAILY. 180 capsule 3  . albuterol (PROVENTIL HFA;VENTOLIN HFA) 108 (90 Base) MCG/ACT inhaler Inhale 1 puff into the lungs every 4 (four) hours as needed for wheezing. 1-2 puffs po bid prn asthma, as directed by pulmonolgy 1 Inhaler 6  . albuterol (PROVENTIL) (2.5 MG/3ML) 0.083% nebulizer solution Take 3 mLs (2.5 mg total) by nebulization every 6 (six) hours as needed for wheezing or shortness of breath. 150 mL 1  . amoxicillin (AMOXIL) 500 MG capsule Take 1 capsule (500 mg total) by mouth 3 (three) times daily. 30 capsule 0  . amphetamine-dextroamphetamine (ADDERALL) 20 MG tablet Take 1 tablet (20 mg total) by mouth 2 (two) times daily. July 2020 60 tablet 0  . amphetamine-dextroamphetamine (ADDERALL) 20 MG tablet Take 1 tablet (20 mg total) by mouth 2 (two) times  daily. June 2020 60 tablet 0  . amphetamine-dextroamphetamine (ADDERALL) 20 MG tablet Take 1 tablet (20 mg total) by mouth 2 (two) times daily. August 2020 60 tablet 0  . cyclobenzaprine (FLEXERIL) 10 MG tablet Take 1 tablet (10 mg total) by mouth at bedtime. 90 tablet 3  . enalapril (VASOTEC) 5 MG tablet Take 1 tablet (5 mg total) by mouth 2 (two) times daily. 60 tablet 5  . fluconazole (DIFLUCAN) 150 MG tablet TAKE 1 TABLET ONCE, IF NEEDED. TAKE AFTER COMPLETION OF  ANTIBIOTIC RX. 12 tablet 1  . HYDROcodone-acetaminophen (NORCO) 5-325 MG tablet Take 1 tablet by mouth every 6 (six) hours as needed for moderate pain or severe pain. 90 tablet 0  . lidocaine (XYLOCAINE) 2 % jelly APPLY TO AFFECTED AREA DAILY AS NEEDED 20 mL 1  . meloxicam (MOBIC) 15 MG tablet TAKE 1 TABLET BY MOUTH EVERY DAY AS NEEDED FOR PAIN 30 tablet 3  . nitrofurantoin, macrocrystal-monohydrate, (MACROBID) 100 MG capsule TAKE 1 CAPSULE FOR 1 DOSE AS NEEDED FOR PREVENTION OF UTI *AFTER SELF CATH, SWIMMING, HOT TUB, SEX* 90 capsule 0  . Norethindrone Acetate-Ethinyl Estrad-FE (BLISOVI 24 FE) 1-20 MG-MCG(24) tablet Take 1 tablet by mouth daily. 28 tablet 11  . pentosan polysulfate (ELMIRON) 100 MG capsule Take 1 capsule (100 mg total) by mouth 3 (three) times daily before meals. 90 capsule 5  . SUMAtriptan (IMITREX) 50 MG tablet Take 1 tablet (50 mg total) by mouth every 2 (two) hours as needed for migraine. May repeat in 2 hours if headache persists or recurs. 10 tablet 5  . SYMBICORT 160-4.5 MCG/ACT inhaler TAKE 2 PUFFS BY MOUTH TWICE A DAY 10.2 Inhaler 0  . SYNTHROID 137 MCG tablet TAKE 1 TABLET BY MOUTH EVERY DAY 30 tablet 2   No facility-administered medications prior to visit.     Allergies  Allergen Reactions  . Peanut-Containing Drug Products Anaphylaxis  . Influenza Vaccines Rash    Significant swelling in arm, rash, malaise, myalgias   . Terazol [Terconazole]     Burning sensation    Review of Systems   Constitutional: Positive for malaise/fatigue. Negative for chills and fever.  HENT: Positive for congestion. Negative for hearing loss.   Eyes: Negative for discharge.  Respiratory: Negative for cough, sputum production and shortness of breath.   Cardiovascular: Negative for chest pain, palpitations and leg swelling.  Gastrointestinal: Positive for abdominal pain. Negative for blood in stool, constipation, diarrhea, heartburn, nausea and vomiting.  Genitourinary: Negative for dysuria, frequency, hematuria and urgency.  Musculoskeletal: Negative for back pain, falls and myalgias.  Skin: Negative for rash.  Neurological: Negative for dizziness, sensory change, loss of consciousness, weakness and headaches.  Endo/Heme/Allergies: Negative for environmental allergies. Does not bruise/bleed easily.  Psychiatric/Behavioral: Negative for depression and suicidal ideas. The patient is not nervous/anxious and does not have insomnia.        Objective:    Physical Exam Constitutional:      General: She is not in acute distress.    Appearance: She is well-developed.  HENT:     Head: Normocephalic and atraumatic.  Eyes:     Conjunctiva/sclera: Conjunctivae normal.  Neck:     Musculoskeletal: Neck supple.     Thyroid: No thyromegaly.  Cardiovascular:     Rate and Rhythm: Normal rate and regular rhythm.     Heart sounds: Normal heart sounds. No murmur.  Pulmonary:     Effort: Pulmonary effort is normal. No respiratory distress.     Breath sounds: Normal breath sounds.  Abdominal:     General: Bowel sounds are normal. There is no distension.     Palpations: Abdomen is soft. There is no mass.     Tenderness: There is no abdominal tenderness.  Lymphadenopathy:     Cervical: No cervical adenopathy.  Skin:    General: Skin is warm and dry.  Neurological:     Mental Status: She is alert and oriented to person, place, and time.  Psychiatric:        Behavior: Behavior  normal.     BP 130/76  (BP Location: Left Arm, Patient Position: Sitting, Cuff Size: Normal)   Pulse 85   Temp 98.3 F (36.8 C) (Temporal)   Resp 18   Wt 161 lb 9.6 oz (73.3 kg)   SpO2 99%   BMI 29.56 kg/m  Wt Readings from Last 3 Encounters:  12/07/18 161 lb 9.6 oz (73.3 kg)  12/01/17 155 lb (70.3 kg)  06/02/17 153 lb 12.8 oz (69.8 kg)    Diabetic Foot Exam - Simple   No data filed     Lab Results  Component Value Date   WBC 7.0 12/07/2018   HGB 13.6 12/07/2018   HCT 40.6 12/07/2018   PLT 223.0 12/07/2018   GLUCOSE 87 12/07/2018   CHOL 235 (H) 12/07/2018   TRIG 248.0 (H) 12/07/2018   HDL 39.40 12/07/2018   LDLDIRECT 134.0 12/07/2018   LDLCALC 136 (H) 11/22/2012   ALT 9 12/07/2018   AST 13 12/07/2018   NA 137 12/07/2018   K 4.5 12/07/2018   CL 105 12/07/2018   CREATININE 0.70 12/07/2018   BUN 12 12/07/2018   CO2 27 12/07/2018   TSH 0.53 12/07/2018   INR 1.09 05/23/2015    Lab Results  Component Value Date   TSH 0.53 12/07/2018   Lab Results  Component Value Date   WBC 7.0 12/07/2018   HGB 13.6 12/07/2018   HCT 40.6 12/07/2018   MCV 92.0 12/07/2018   PLT 223.0 12/07/2018   Lab Results  Component Value Date   NA 137 12/07/2018   K 4.5 12/07/2018   CO2 27 12/07/2018   GLUCOSE 87 12/07/2018   BUN 12 12/07/2018   CREATININE 0.70 12/07/2018   BILITOT 0.4 12/07/2018   ALKPHOS 42 12/07/2018   AST 13 12/07/2018   ALT 9 12/07/2018   PROT 6.5 12/07/2018   ALBUMIN 3.8 12/07/2018   CALCIUM 8.9 12/07/2018   ANIONGAP 11 05/23/2015   GFR 91.35 12/07/2018   Lab Results  Component Value Date   CHOL 235 (H) 12/07/2018   Lab Results  Component Value Date   HDL 39.40 12/07/2018   Lab Results  Component Value Date   LDLCALC 136 (H) 11/22/2012   Lab Results  Component Value Date   TRIG 248.0 (H) 12/07/2018   Lab Results  Component Value Date   CHOLHDL 6 12/07/2018   No results found for: HGBA1C     Assessment & Plan:   Problem List Items Addressed This Visit     Allergy    Has been having trouble with her eyes swelling and facial pressure intermitently. Encouraged Zyrtec and Famotidine bid, flonase 1 spray each nostril bid, nasal saline bid and if persists consider referral      Mild intermittent asthma - Primary    No recent flare.       Anemia   Relevant Orders   CBC (Completed)   Hyperlipidemia    Encouraged heart healthy diet, increase exercise, avoid trans fats, consider a krill oil cap daily      Relevant Orders   Lipid panel (Completed)   HTN (hypertension)    Well controlled, no changes to meds. Encouraged heart healthy diet such as the DASH diet and exercise as tolerated.       Poor concentration    Doing well on Adderall without concerning side effects no changes to therapy at this time.      Hypothyroid    On Levothyroxine, continue to monitor  Relevant Orders   TSH (Completed)   Preventative health care    Patient encouraged to maintain heart healthy diet, regular exercise, adequate sleep. Consider daily probiotics. Take medications as prescribed. Labs ordered and reviewed. PAP utd      Sun-damaged skin   Vitamin B12 deficiency   Relevant Orders   Vitamin B12 (Completed)   Vitamin D deficiency   Relevant Orders   CMP (Completed)   VITAMIN D (Completed)   History of colon polyps   Relevant Orders   Ambulatory referral to Gastroenterology   Colonic diverticulum    Patient experiencing      Relevant Orders   Ambulatory referral to Gastroenterology   LUQ pain    Left sided, midline pain intermittently. Has a h/o diverticulum and IBS and has seen Dr Loreta Ave of gastroenterology in past referral is placed      Relevant Orders   Ambulatory referral to Gastroenterology      I am having Kye Arlina Robes maintain her pentosan polysulfate, SUMAtriptan, cyclobenzaprine, meloxicam, fluconazole, albuterol, lidocaine, albuterol, amphetamine-dextroamphetamine, amphetamine-dextroamphetamine, acyclovir,  HYDROcodone-acetaminophen, Norethindrone Acetate-Ethinyl Estrad-FE, nitrofurantoin (macrocrystal-monohydrate), enalapril, Synthroid, amoxicillin, amphetamine-dextroamphetamine, and Symbicort.  No orders of the defined types were placed in this encounter.    Danise Edge, MD

## 2018-12-07 NOTE — Patient Instructions (Signed)
 Preventive Care 21-43 Years Old, Female Preventive care refers to visits with your health care provider and lifestyle choices that can promote health and wellness. This includes:  A yearly physical exam. This may also be called an annual well check.  Regular dental visits and eye exams.  Immunizations.  Screening for certain conditions.  Healthy lifestyle choices, such as eating a healthy diet, getting regular exercise, not using drugs or products that contain nicotine and tobacco, and limiting alcohol use. What can I expect for my preventive care visit? Physical exam Your health care provider will check your:  Height and weight. This may be used to calculate body mass index (BMI), which tells if you are at a healthy weight.  Heart rate and blood pressure.  Skin for abnormal spots. Counseling Your health care provider may ask you questions about your:  Alcohol, tobacco, and drug use.  Emotional well-being.  Home and relationship well-being.  Sexual activity.  Eating habits.  Work and work environment.  Method of birth control.  Menstrual cycle.  Pregnancy history. What immunizations do I need?  Influenza (flu) vaccine  This is recommended every year. Tetanus, diphtheria, and pertussis (Tdap) vaccine  You may need a Td booster every 10 years. Varicella (chickenpox) vaccine  You may need this if you have not been vaccinated. Human papillomavirus (HPV) vaccine  If recommended by your health care provider, you may need three doses over 6 months. Measles, mumps, and rubella (MMR) vaccine  You may need at least one dose of MMR. You may also need a second dose. Meningococcal conjugate (MenACWY) vaccine  One dose is recommended if you are age 19-21 years and a first-year college student living in a residence hall, or if you have one of several medical conditions. You may also need additional booster doses. Pneumococcal conjugate (PCV13) vaccine  You may need  this if you have certain conditions and were not previously vaccinated. Pneumococcal polysaccharide (PPSV23) vaccine  You may need one or two doses if you smoke cigarettes or if you have certain conditions. Hepatitis A vaccine  You may need this if you have certain conditions or if you travel or work in places where you may be exposed to hepatitis A. Hepatitis B vaccine  You may need this if you have certain conditions or if you travel or work in places where you may be exposed to hepatitis B. Haemophilus influenzae type b (Hib) vaccine  You may need this if you have certain conditions. You may receive vaccines as individual doses or as more than one vaccine together in one shot (combination vaccines). Talk with your health care provider about the risks and benefits of combination vaccines. What tests do I need?  Blood tests  Lipid and cholesterol levels. These may be checked every 5 years starting at age 20.  Hepatitis C test.  Hepatitis B test. Screening  Diabetes screening. This is done by checking your blood sugar (glucose) after you have not eaten for a while (fasting).  Sexually transmitted disease (STD) testing.  BRCA-related cancer screening. This may be done if you have a family history of breast, ovarian, tubal, or peritoneal cancers.  Pelvic exam and Pap test. This may be done every 3 years starting at age 21. Starting at age 30, this may be done every 5 years if you have a Pap test in combination with an HPV test. Talk with your health care provider about your test results, treatment options, and if necessary, the need for more   tests. Follow these instructions at home: Eating and drinking   Eat a diet that includes fresh fruits and vegetables, whole grains, lean protein, and low-fat dairy.  Take vitamin and mineral supplements as recommended by your health care provider.  Do not drink alcohol if: ? Your health care provider tells you not to drink. ? You are  pregnant, may be pregnant, or are planning to become pregnant.  If you drink alcohol: ? Limit how much you have to 0-1 drink a day. ? Be aware of how much alcohol is in your drink. In the [place] one drink equals one 12 oz bottle of beer (355 mL), one 5 oz glass of wine (148 mL), or one 1 oz glass of hard liquor (44 mL). Lifestyle  Take daily care of your teeth and gums.  Stay active. Exercise for at least 30 minutes on 5 or more days each week.  Do not use any products that contain nicotine or tobacco, such as cigarettes, e-cigarettes, and chewing tobacco. If you need help quitting, ask your health care provider.  If you are sexually active, practice safe sex. Use a condom or other form of birth control (contraception) in order to prevent pregnancy and STIs (sexually transmitted infections). If you plan to become pregnant, see your health care provider for a preconception visit. What's next?  Visit your health care provider once a year for a well check visit.  Ask your health care provider how often you should have your eyes and teeth checked.  Stay up to date on all vaccines. This information is not intended to replace advice given to you by your health care provider. Make sure you discuss any questions you have with your health care provider. Document Released: 02/25/2001 Document Revised: 09/10/2017 Document Reviewed: 09/10/2017 Elsevier Patient Education  2020 [employer].

## 2018-12-07 NOTE — Assessment & Plan Note (Signed)
Patient experiencing

## 2018-12-08 ENCOUNTER — Other Ambulatory Visit: Payer: 59

## 2018-12-08 ENCOUNTER — Other Ambulatory Visit: Payer: Self-pay | Admitting: Emergency Medicine

## 2018-12-08 DIAGNOSIS — E538 Deficiency of other specified B group vitamins: Secondary | ICD-10-CM

## 2018-12-10 LAB — INTRINSIC FACTOR ANTIBODIES: Intrinsic Factor: NEGATIVE

## 2018-12-12 NOTE — Assessment & Plan Note (Signed)
Doing well on Adderall without concerning side effects no changes to therapy at this time.

## 2018-12-12 NOTE — Assessment & Plan Note (Signed)
Has been having trouble with her eyes swelling and facial pressure intermitently. Encouraged Zyrtec and Famotidine bid, flonase 1 spray each nostril bid, nasal saline bid and if persists consider referral

## 2018-12-12 NOTE — Assessment & Plan Note (Signed)
Left sided, midline pain intermittently. Has a h/o diverticulum and IBS and has seen Dr Collene Mares of gastroenterology in past referral is placed

## 2018-12-12 NOTE — Assessment & Plan Note (Signed)
Encouraged heart healthy diet, increase exercise, avoid trans fats, consider a krill oil cap daily 

## 2018-12-12 NOTE — Assessment & Plan Note (Signed)
No recent flare.  

## 2018-12-12 NOTE — Assessment & Plan Note (Signed)
Well controlled, no changes to meds. Encouraged heart healthy diet such as the DASH diet and exercise as tolerated.  °

## 2018-12-12 NOTE — Assessment & Plan Note (Signed)
On Levothyroxine, continue to monitor 

## 2018-12-12 NOTE — Assessment & Plan Note (Signed)
Patient encouraged to maintain heart healthy diet, regular exercise, adequate sleep. Consider daily probiotics. Take medications as prescribed. Labs ordered and reviewed. PAP utd

## 2018-12-16 ENCOUNTER — Other Ambulatory Visit: Payer: Self-pay | Admitting: Family Medicine

## 2018-12-16 ENCOUNTER — Encounter: Payer: Self-pay | Admitting: Family Medicine

## 2018-12-16 MED ORDER — AMPHETAMINE-DEXTROAMPHETAMINE 20 MG PO TABS: 20.0000 mg | ORAL_TABLET | Freq: Two times a day (BID) | ORAL | 0 refills | Status: DC

## 2018-12-17 ENCOUNTER — Other Ambulatory Visit: Payer: Self-pay | Admitting: Family Medicine

## 2018-12-17 ENCOUNTER — Other Ambulatory Visit: Payer: Self-pay | Admitting: *Deleted

## 2018-12-17 MED ORDER — AMOXICILLIN 500 MG PO CAPS: 500.0000 mg | ORAL_CAPSULE | Freq: Three times a day (TID) | ORAL | 0 refills | Status: DC

## 2018-12-17 MED ORDER — METHYLPREDNISOLONE 4 MG PO TABS: ORAL_TABLET | ORAL | 0 refills | Status: DC

## 2018-12-17 NOTE — Progress Notes (Unsigned)
See lab results for instructions on Vitamin B 12 shots

## 2018-12-20 MED ORDER — CYANOCOBALAMIN 1000 MCG/ML IJ SOLN: 1000.0000 ug | INTRAMUSCULAR | 0 refills | Status: DC

## 2018-12-20 MED ORDER — "LUER LOCK SAFETY SYRINGES 25G X 1"" 3 ML MISC": 5 refills | Status: DC

## 2018-12-20 NOTE — Addendum Note (Signed)
Addended by: Kem Boroughs D on: 12/20/2018 11:04 AM   Modules accepted: Orders

## 2019-01-12 ENCOUNTER — Other Ambulatory Visit: Payer: Self-pay | Admitting: Family Medicine

## 2019-02-07 ENCOUNTER — Other Ambulatory Visit: Payer: Self-pay | Admitting: Family Medicine

## 2019-02-08 ENCOUNTER — Other Ambulatory Visit: Payer: Self-pay | Admitting: Family Medicine

## 2019-02-08 MED ORDER — AMPHETAMINE-DEXTROAMPHETAMINE 20 MG PO TABS: 20.0000 mg | ORAL_TABLET | Freq: Two times a day (BID) | ORAL | 0 refills | Status: DC

## 2019-02-10 ENCOUNTER — Other Ambulatory Visit: Payer: Self-pay | Admitting: Family Medicine

## 2019-02-11 ENCOUNTER — Other Ambulatory Visit: Payer: Self-pay | Admitting: Family Medicine

## 2019-02-24 ENCOUNTER — Other Ambulatory Visit: Payer: Self-pay | Admitting: Gastroenterology

## 2019-02-24 ENCOUNTER — Ambulatory Visit
Admission: RE | Admit: 2019-02-24 | Discharge: 2019-02-24 | Disposition: A | Discharge status: Home / Self Care | Payer: 59 | Source: Ambulatory Visit | Attending: Gastroenterology | Admitting: Gastroenterology

## 2019-02-24 ENCOUNTER — Other Ambulatory Visit: Payer: Self-pay

## 2019-02-24 ENCOUNTER — Other Ambulatory Visit: Payer: 59

## 2019-02-24 DIAGNOSIS — R1032 Left lower quadrant pain: Secondary | ICD-10-CM

## 2019-02-24 MED ORDER — IOPAMIDOL (ISOVUE-300) INJECTION 61%
100.0000 mL | Freq: Once | INTRAVENOUS | Status: AC | PRN
  Administered 2019-02-24: 16:00:00 100 mL via INTRAVENOUS

## 2019-04-12 ENCOUNTER — Other Ambulatory Visit: Payer: Self-pay | Admitting: Family Medicine

## 2019-04-12 MED ORDER — AMPHETAMINE-DEXTROAMPHETAMINE 20 MG PO TABS: 20.0000 mg | ORAL_TABLET | Freq: Two times a day (BID) | ORAL | 0 refills | Status: DC

## 2019-04-28 LAB — HM MAMMOGRAPHY

## 2019-05-16 ENCOUNTER — Telehealth (INDEPENDENT_AMBULATORY_CARE_PROVIDER_SITE_OTHER): Payer: Managed Care, Other (non HMO) | Admitting: Family Medicine

## 2019-05-16 ENCOUNTER — Encounter: Payer: Self-pay | Admitting: Family Medicine

## 2019-05-16 VITALS — BP 143/89 | HR 99 | Temp 97.0°F

## 2019-05-16 DIAGNOSIS — S70362A Insect bite (nonvenomous), left thigh, initial encounter: Secondary | ICD-10-CM | POA: Diagnosis not present

## 2019-05-16 DIAGNOSIS — I10 Essential (primary) hypertension: Secondary | ICD-10-CM | POA: Diagnosis not present

## 2019-05-16 DIAGNOSIS — R21 Rash and other nonspecific skin eruption: Secondary | ICD-10-CM

## 2019-05-16 DIAGNOSIS — W57XXXA Bitten or stung by nonvenomous insect and other nonvenomous arthropods, initial encounter: Secondary | ICD-10-CM | POA: Diagnosis not present

## 2019-05-16 MED ORDER — DOXYCYCLINE HYCLATE 100 MG PO TABS: 100.0000 mg | ORAL_TABLET | Freq: Two times a day (BID) | ORAL | 0 refills | Status: DC

## 2019-05-16 MED ORDER — ENALAPRIL MALEATE 5 MG PO TABS: 10.0000 mg | ORAL_TABLET | Freq: Two times a day (BID) | ORAL | 5 refills | Status: DC

## 2019-05-16 NOTE — Progress Notes (Addendum)
Chief Complaint  Patient presents with  . Insect Bite    tick    Kristen [name] is a 44 y.o. female here for a skin complaint.  Duration: 2 days Location: L thigh Pruritic? Yes Painful? Yes Drainage? No Other associated symptoms: target lesion; was working in garden Therapies tried thus far: none  Hypertension Patient presents for hypertension follow up. She does monitor home blood pressures and they have been running high.  She is compliant with medications- Bystolic prn, enalapril 5 mg bid. Patient has these side effects of medication: none  Past Medical History:  Diagnosis Date  . Abnormal cervical cytology 09/26/2011   LGSIL in 2011, patient reports repeat pap was normal, has had intermittent abnormals with bx in past but always normalized, has had cryotherapy with good results Menarche at 17, irregular without birth control at times, very heavy LMP 08/26/2011 No gyn concerns, MGM last year normal   . ADD (attention deficit disorder)   . Allergy    seasonal  . Anemia    blood loss  . Asthma    mild, intermittent  . Bronchitis, acute 12/16/2010  . Chest pain, atypical 10/08/2010  . Chicken pox as a child  . Concussion 09/24/2010  . Contraceptive management 02/17/2011  . Decreased libido 09/26/2011  . Depression 2002   post partum  . Family history of heart disease   . Female bladder prolapse, acquired 09/24/2010  . Fibromyalgia   . Great toe pain, right 06/02/2017  . Headache 09/26/2013  . History of concussion 09/24/2010  . History of migraines 09/24/2010  . 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 HTN (hypertension) 09/24/2010  . Hyperlipidemia   . Hyperlipidemia   . Hypertension   . Interstitial cystitis 02/18/2011  . Left shoulder pain 05/27/2016  . Lymphadenopathy 09/24/2010  . Migraine 09/24/2010  . Migraine aura, persistent, with cerebral infarct, status over 72 hours 09/24/2010  . Myalgia 06/08/2018  . Neck pain, musculoskeletal 12/16/2010  . Overweight 05/29/2015  .  Overweight(278.02) 12/26/2011  . Pharyngitis 05/27/2016  . Poor concentration 12/16/2010  . Preventative health care 11/28/2012  . Recurrent UTI   . Renal lithiasis 09/24/2010  . RUQ pain 05/27/2011  . Subacute and chronic vaginitis 09/24/2010  . Thyroid disease   . Tick bite 12/04/2013  . Transient left leg weakness 05/21/2014  . Vaginitis and vulvovaginitis 05/21/2014    BP (!) 143/89 (BP Location: Left Arm, Patient Position: Sitting, Cuff Size: Normal)   Pulse 99   Temp (!) 97 F (36.1 C) (Oral)  Gen: awake, alert, appearing stated age Lungs: No conversational dyspnea Skin: L antero-medial thigh: Central pustule surrounded by erythema, central clearing and erythema again.  Psych: Age appropriate judgment and insight  Tick bite, initial encounter - Plan: doxycycline (VIBRA-TABS) 100 MG tablet  Target rash - Plan: doxycycline (VIBRA-TABS) 100 MG tablet  Essential hypertension - Plan: enalapril (VASOTEC) 5 MG tablet  1- Of left thigh; 3 weeks of doxy to empirically tx for Lyme Dz.  2- Increase ACEi to 10 mg bid. She has f/u w Dr. Abner Greenspan next month.  F/u prn w me. The patient voiced understanding and agreement to the plan.  Jilda Roche Colby, DO 05/16/19 1:49 PM

## 2019-05-28 ENCOUNTER — Other Ambulatory Visit: Payer: Self-pay | Admitting: Family Medicine

## 2019-05-31 ENCOUNTER — Other Ambulatory Visit: Payer: Self-pay | Admitting: Family Medicine

## 2019-05-31 MED ORDER — AMPHETAMINE-DEXTROAMPHETAMINE 20 MG PO TABS: 20.0000 mg | ORAL_TABLET | Freq: Two times a day (BID) | ORAL | 0 refills | Status: DC

## 2019-06-06 ENCOUNTER — Encounter: Payer: Self-pay | Admitting: Family Medicine

## 2019-06-06 ENCOUNTER — Other Ambulatory Visit: Payer: Self-pay

## 2019-06-06 ENCOUNTER — Ambulatory Visit: Payer: Managed Care, Other (non HMO) | Admitting: Family Medicine

## 2019-06-06 VITALS — BP 130/90 | HR 80 | Temp 95.2°F | Ht 62.0 in | Wt 157.2 lb

## 2019-06-06 DIAGNOSIS — I10 Essential (primary) hypertension: Secondary | ICD-10-CM | POA: Diagnosis not present

## 2019-06-06 DIAGNOSIS — E782 Mixed hyperlipidemia: Secondary | ICD-10-CM | POA: Diagnosis not present

## 2019-06-06 DIAGNOSIS — R102 Pelvic and perineal pain: Secondary | ICD-10-CM | POA: Diagnosis not present

## 2019-06-06 DIAGNOSIS — E559 Vitamin D deficiency, unspecified: Secondary | ICD-10-CM | POA: Diagnosis not present

## 2019-06-06 DIAGNOSIS — W57XXXD Bitten or stung by nonvenomous insect and other nonvenomous arthropods, subsequent encounter: Secondary | ICD-10-CM

## 2019-06-06 DIAGNOSIS — E039 Hypothyroidism, unspecified: Secondary | ICD-10-CM

## 2019-06-06 DIAGNOSIS — D649 Anemia, unspecified: Secondary | ICD-10-CM | POA: Diagnosis not present

## 2019-06-06 DIAGNOSIS — N390 Urinary tract infection, site not specified: Secondary | ICD-10-CM

## 2019-06-06 DIAGNOSIS — M51369 Other intervertebral disc degeneration, lumbar region without mention of lumbar back pain or lower extremity pain: Secondary | ICD-10-CM | POA: Insufficient documentation

## 2019-06-06 DIAGNOSIS — M5136 Other intervertebral disc degeneration, lumbar region: Secondary | ICD-10-CM | POA: Insufficient documentation

## 2019-06-06 DIAGNOSIS — E538 Deficiency of other specified B group vitamins: Secondary | ICD-10-CM

## 2019-06-06 LAB — COMPREHENSIVE METABOLIC PANEL
ALT: 17 U/L (ref 0–35)
AST: 17 U/L (ref 0–37)
Albumin: 4 g/dL (ref 3.5–5.2)
Alkaline Phosphatase: 48 U/L (ref 39–117)
BUN: 14 mg/dL (ref 6–23)
CO2: 26 mEq/L (ref 19–32)
Calcium: 9.1 mg/dL (ref 8.4–10.5)
Chloride: 102 mEq/L (ref 96–112)
Creatinine, Ser: 0.78 mg/dL (ref 0.40–1.20)
GFR: 80.43 mL/min (ref >60.00)
Glucose, Bld: 90 mg/dL (ref 70–99)
Potassium: 4.2 mEq/L (ref 3.5–5.1)
Sodium: 136 mEq/L (ref 135–145)
Total Bilirubin: 0.5 mg/dL (ref 0.2–1.2)
Total Protein: 6.4 g/dL (ref 6.0–8.3)

## 2019-06-06 LAB — LDL CHOLESTEROL, DIRECT: Direct LDL: 103 mg/dL

## 2019-06-06 LAB — CBC WITH DIFFERENTIAL/PLATELET
Basophils Absolute: 0 10*3/uL (ref 0.0–0.1)
Basophils Relative: 0.6 % (ref 0.0–3.0)
Eosinophils Absolute: 0.2 10*3/uL (ref 0.0–0.7)
Eosinophils Relative: 2.8 % (ref 0.0–5.0)
HCT: 40.9 % (ref 36.0–46.0)
Hemoglobin: 13.7 g/dL (ref 12.0–15.0)
Lymphocytes Relative: 43 % (ref 12.0–46.0)
Lymphs Abs: 3.1 10*3/uL (ref 0.7–4.0)
MCHC: 33.5 g/dL (ref 30.0–36.0)
MCV: 91.7 fl (ref 78.0–100.0)
Monocytes Absolute: 0.5 10*3/uL (ref 0.1–1.0)
Monocytes Relative: 7 % (ref 3.0–12.0)
Neutro Abs: 3.3 10*3/uL (ref 1.4–7.7)
Neutrophils Relative %: 46.6 % (ref 43.0–77.0)
Platelets: 210 10*3/uL (ref 150.0–400.0)
RBC: 4.46 Mil/uL (ref 3.87–5.11)
RDW: 12.6 % (ref 11.5–15.5)
WBC: 7.2 10*3/uL (ref 4.0–10.5)

## 2019-06-06 LAB — URINALYSIS
Bilirubin Urine: NEGATIVE
Ketones, ur: NEGATIVE
Leukocytes,Ua: NEGATIVE
Nitrite: NEGATIVE
Specific Gravity, Urine: 1.015 (ref 1.000–1.030)
Total Protein, Urine: NEGATIVE
Urine Glucose: NEGATIVE
Urobilinogen, UA: 0.2 (ref 0.0–1.0)
pH: 7.5 (ref 5.0–8.0)

## 2019-06-06 LAB — TSH: TSH: 0.1 u[IU]/mL — ABNORMAL LOW (ref 0.35–4.50)

## 2019-06-06 LAB — T4, FREE: Free T4: 1.13 ng/dL (ref 0.60–1.60)

## 2019-06-06 LAB — LIPID PANEL
Cholesterol: 244 mg/dL — ABNORMAL HIGH (ref 0–200)
HDL: 35.6 mg/dL — ABNORMAL LOW (ref >39.00)
Total CHOL/HDL Ratio: 7
Triglycerides: 533 mg/dL — ABNORMAL HIGH (ref 0.0–149.0)

## 2019-06-06 LAB — T3, FREE: T3, Free: 3.5 pg/mL (ref 2.3–4.2)

## 2019-06-06 LAB — VITAMIN D 25 HYDROXY (VIT D DEFICIENCY, FRACTURES): VITD: 43.48 ng/mL (ref 30.00–100.00)

## 2019-06-06 LAB — VITAMIN B12: Vitamin B-12: 236 pg/mL (ref 211–911)

## 2019-06-06 LAB — MAGNESIUM: Magnesium: 2.3 mg/dL (ref 1.5–2.5)

## 2019-06-06 MED ORDER — ENALAPRIL MALEATE 5 MG PO TABS: 5.0000 mg | ORAL_TABLET | Freq: Two times a day (BID) | ORAL | 5 refills | Status: DC

## 2019-06-06 MED ORDER — NEBIVOLOL HCL 5 MG PO TABS: 5.0000 mg | ORAL_TABLET | Freq: Every day | ORAL | 1 refills | Status: DC

## 2019-06-06 NOTE — Progress Notes (Signed)
Subjective:    Patient ID: Kristen [name], female    DOB: 10/22/1975, 44 y.o.   MRN: [id number]  Chief Complaint  Patient presents with  . Follow-up    HPI Patient is in today for follow up on chronic medical concerns. Her greatest concern is her labile blood pressure readings. She had a stomach bug/pain back in January and her BP started to climb up back then. Then about 3 weeks ago she had a bad tick bite on left thigh with fevers, chills, myalgias, target lesion. She was treated with Doxycycline and she is feeling much better but her blood pressure has spiked up since then. Numbers have been labile as well. Her Enalapril has been increased to 10 mg twice a day but numbers continue to spike with diastolics increasing above 100 at times. She has occasionally taken her Bystolic which has helped some but less than has in past. She also is following with Dr Juliene PinaMody of GYN and they recently treated her for UTI. This helped her LLQ pain some. Denies CP/palp/SOB/congestion/fevers/GI or GU c/o. Taking meds as prescribed. She is planning her hysterecyomy in the fall. Her rash from the tick bite has resolved.   Past Medical History:  Diagnosis Date  . Abnormal cervical cytology 09/26/2011   LGSIL in 2011, patient reports repeat pap was normal, has had intermittent abnormals with bx in past but always normalized, has had cryotherapy with good results Menarche at 17, irregular without birth control at times, very heavy LMP 08/26/2011 No gyn concerns, MGM last year normal   . ADD (attention deficit disorder)   . Allergy    seasonal  . Anemia    blood loss  . Asthma    mild, intermittent  . Bronchitis, acute 12/16/2010  . Chest pain, atypical 10/08/2010  . Chicken pox as a child  . Concussion 09/24/2010  . Contraceptive management 02/17/2011  . Decreased libido 09/26/2011  . Depression 2002   post partum  . Family history of heart disease   . Female bladder prolapse, acquired 09/24/2010  . Fibromyalgia   .  Great toe pain, right 06/02/2017  . Headache 09/26/2013  . History of concussion 09/24/2010  . History of migraines 09/24/2010  . 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 HTN (hypertension) 09/24/2010  . Hyperlipidemia   . Hyperlipidemia   . Hypertension   . Interstitial cystitis 02/18/2011  . Left shoulder pain 05/27/2016  . Lymphadenopathy 09/24/2010  . Migraine 09/24/2010  . Migraine aura, persistent, with cerebral infarct, status over 72 hours 09/24/2010  . Myalgia 06/08/2018  . Neck pain, musculoskeletal 12/16/2010  . Overweight 05/29/2015  . Overweight(278.02) 12/26/2011  . Pharyngitis 05/27/2016  . Poor concentration 12/16/2010  . Preventative health care 11/28/2012  . Recurrent UTI   . Renal lithiasis 09/24/2010  . RUQ pain 05/27/2011  . Subacute and chronic vaginitis 09/24/2010  . Thyroid disease   . Tick bite 12/04/2013  . Transient left leg weakness 05/21/2014  . Vaginitis and vulvovaginitis 05/21/2014    Past Surgical History:  Procedure Laterality Date  . INCONTINENCE SURGERY  2003    Family History  Problem Relation Age of Onset  . Heart attack Father   . Hyperlipidemia Father   . Hypertension Father   . Alcohol abuse Father   . Heart disease Father   . Heart attack Brother 45  . Hypertension Brother   . Heart disease Brother 45       MI  . Hyperlipidemia Brother   . Dementia  Maternal Grandfather 92       early stages  . Stroke Maternal Grandfather   . Heart attack Paternal Grandfather   . Stroke Paternal Grandfather   . Heart disease Paternal Grandfather   . Alcohol abuse Paternal Grandfather   . Hyperlipidemia Paternal Grandfather   . Hypertension Paternal Grandfather   . Osteoporosis Mother   . Hyperlipidemia Mother   . Heart disease Maternal Grandmother   . Heart failure Maternal Grandmother     Social History   Socioeconomic History  . Marital status: Married    Spouse name: Not on file  . Number of children: Not on file  . Years of education: Not on file  .  Highest education level: Not on file  Occupational History  . Not on file  Tobacco Use  . Smoking status: Never Smoker  . Smokeless tobacco: Never Used  Substance and Sexual Activity  . Alcohol use: No    Alcohol/week: 0.0 standard drinks    Comment: 0  . Drug use: No  . Sexual activity: Yes    Partners: Male    Birth control/protection: Pill  Other Topics Concern  . Not on file  Social History Narrative   Lives with husband 2 children in a one story home.     Works as a Teacher, adult education.     Education: college.   Social Determinants of Health   Financial Resource Strain:   . Difficulty of Paying Living Expenses:   Food Insecurity:   . Worried About Programme researcher, broadcasting/film/video in the Last Year:   . Barista in the Last Year:   Transportation Needs:   . Freight forwarder (Medical):   Marland Kitchen Lack of Transportation (Non-Medical):   Physical Activity:   . Days of Exercise per Week:   . Minutes of Exercise per Session:   Stress:   . Feeling of Stress :   Social Connections:   . Frequency of Communication with Friends and Family:   . Frequency of Social Gatherings with Friends and Family:   . Attends Religious Services:   . Active Member of Clubs or Organizations:   . Attends Banker Meetings:   Marland Kitchen Marital Status:   Intimate Partner Violence:   . Fear of Current or Ex-Partner:   . Emotionally Abused:   Marland Kitchen Physically Abused:   . Sexually Abused:     Outpatient Medications Prior to Visit  Medication Sig Dispense Refill  . acyclovir (ZOVIRAX) 200 MG capsule TAKE 1 CAPSULE BY MOUTH TWICE A DAY INS LIMIT OF 30 DAYS 60 capsule 11  . amphetamine-dextroamphetamine (ADDERALL) 20 MG tablet Take 1 tablet (20 mg total) by mouth 2 (two) times daily. July 2020 60 tablet 0  . Norethindrone Acetate-Ethinyl Estrad-FE (BLISOVI 24 FE) 1-20 MG-MCG(24) tablet Take 1 tablet by mouth daily. 28 tablet 11  . SYMBICORT 160-4.5 MCG/ACT inhaler TAKE 2 PUFFS BY MOUTH TWICE A DAY 10.2  Inhaler 5  . SYNTHROID 137 MCG tablet TAKE 1 TABLET BY MOUTH EVERY DAY 30 tablet 2  . SYRINGE-NEEDLE, DISP, 3 ML (LUER LOCK SAFETY SYRINGES) 25G X 1" 3 ML MISC Use as directed with b12 injections. 12 each 5  . doxycycline (VIBRA-TABS) 100 MG tablet Take 1 tablet (100 mg total) by mouth 2 (two) times daily for 21 days. 42 tablet 0  . enalapril (VASOTEC) 5 MG tablet Take 2 tablets (10 mg total) by mouth 2 (two) times daily. 60 tablet 5  . albuterol (PROVENTIL  HFA;VENTOLIN HFA) 108 (90 Base) MCG/ACT inhaler Inhale 1 puff into the lungs every 4 (four) hours as needed for wheezing. 1-2 puffs po bid prn asthma, as directed by pulmonolgy (Patient not taking: Reported on 05/16/2019) 1 Inhaler 6  . albuterol (PROVENTIL) (2.5 MG/3ML) 0.083% nebulizer solution Take 3 mLs (2.5 mg total) by nebulization every 6 (six) hours as needed for wheezing or shortness of breath. (Patient not taking: Reported on 05/16/2019) 150 mL 1  . amphetamine-dextroamphetamine (ADDERALL) 20 MG tablet Take 1 tablet (20 mg total) by mouth 2 (two) times daily. June 2020 60 tablet 0  . amphetamine-dextroamphetamine (ADDERALL) 20 MG tablet Take 1 tablet (20 mg total) by mouth 2 (two) times daily. Mayy 2021 60 tablet 0  . cyanocobalamin (,VITAMIN B-12,) 1000 MCG/ML injection INJECT 1 ML INTO THE MUSCLE EVERY 7 DAYS. 12 mL 1  . cyclobenzaprine (FLEXERIL) 10 MG tablet Take 1 tablet (10 mg total) by mouth at bedtime. (Patient not taking: Reported on 05/16/2019) 90 tablet 3  . fluconazole (DIFLUCAN) 150 MG tablet TAKE 1 TABLET ONCE, IF NEEDED. TAKE AFTER COMPLETION OF ANTIBIOTIC RX. (Patient not taking: Reported on 05/16/2019) 12 tablet 1  . lidocaine (XYLOCAINE) 2 % jelly APPLY TO AFFECTED AREA DAILY AS NEEDED (Patient not taking: Reported on 05/16/2019) 20 mL 1  . meloxicam (MOBIC) 15 MG tablet TAKE 1 TABLET BY MOUTH EVERY DAY AS NEEDED FOR PAIN (Patient not taking: Reported on 05/16/2019) 30 tablet 3  . pentosan polysulfate (ELMIRON) 100 MG capsule Take 1  capsule (100 mg total) by mouth 3 (three) times daily before meals. (Patient not taking: Reported on 06/06/2019) 90 capsule 5  . SUMAtriptan (IMITREX) 50 MG tablet Take 1 tablet (50 mg total) by mouth every 2 (two) hours as needed for migraine. May repeat in 2 hours if headache persists or recurs. (Patient not taking: Reported on 06/06/2019) 10 tablet 5   No facility-administered medications prior to visit.    Allergies  Allergen Reactions  . Peanut-Containing Drug Products Anaphylaxis  . Influenza Vaccines Rash    Significant swelling in arm, rash, malaise, myalgias   . Terazol [Terconazole]     Burning sensation    Review of Systems  Constitutional: Negative for fever and malaise/fatigue.  HENT: Negative for congestion.   Eyes: Negative for blurred vision.  Respiratory: Negative for shortness of breath.   Cardiovascular: Negative for chest pain, palpitations and leg swelling.  Gastrointestinal: Negative for abdominal pain, blood in stool and nausea.  Genitourinary: Negative for dysuria and frequency.  Musculoskeletal: Negative for falls.  Skin: Negative for rash.  Neurological: Positive for headaches. Negative for dizziness and loss of consciousness.  Endo/Heme/Allergies: Negative for environmental allergies.  Psychiatric/Behavioral: Negative for depression. The patient is not nervous/anxious.        Objective:    Physical Exam Vitals and nursing note reviewed.  Constitutional:      General: She is not in acute distress.    Appearance: She is well-developed.  HENT:     Head: Normocephalic and atraumatic.     Nose: Nose normal.  Eyes:     General:        Right eye: No discharge.        Left eye: No discharge.  Cardiovascular:     Rate and Rhythm: Normal rate and regular rhythm.     Heart sounds: No murmur.  Pulmonary:     Effort: Pulmonary effort is normal.     Breath sounds: Normal breath sounds.  Abdominal:  General: Bowel sounds are normal.     Palpations:  Abdomen is soft.     Tenderness: There is no abdominal tenderness.  Musculoskeletal:     Cervical back: Normal range of motion and neck supple.  Skin:    General: Skin is warm and dry.  Neurological:     Mental Status: She is alert and oriented to person, place, and time.     BP 130/90 (BP Location: Left Arm, Patient Position: Sitting, Cuff Size: Normal)   Pulse 80   Temp (!) 95.2 F (35.1 C) (Temporal)   Ht 5\' 2"  (1.575 m)   Wt 157 lb 4 oz (71.3 kg)   SpO2 99%   BMI 28.76 kg/m  Wt Readings from Last 3 Encounters:  06/06/19 157 lb 4 oz (71.3 kg)  12/07/18 161 lb 9.6 oz (73.3 kg)  12/01/17 155 lb (70.3 kg)    Diabetic Foot Exam - Simple   No data filed     Lab Results  Component Value Date   WBC 7.0 12/07/2018   HGB 13.6 12/07/2018   HCT 40.6 12/07/2018   PLT 223.0 12/07/2018   GLUCOSE 87 12/07/2018   CHOL 235 (H) 12/07/2018   TRIG 248.0 (H) 12/07/2018   HDL 39.40 12/07/2018   LDLDIRECT 134.0 12/07/2018   LDLCALC 136 (H) 11/22/2012   ALT 9 12/07/2018   AST 13 12/07/2018   NA 137 12/07/2018   K 4.5 12/07/2018   CL 105 12/07/2018   CREATININE 0.70 12/07/2018   BUN 12 12/07/2018   CO2 27 12/07/2018   TSH 0.53 12/07/2018   INR 1.09 05/23/2015    Lab Results  Component Value Date   TSH 0.53 12/07/2018   Lab Results  Component Value Date   WBC 7.0 12/07/2018   HGB 13.6 12/07/2018   HCT 40.6 12/07/2018   MCV 92.0 12/07/2018   PLT 223.0 12/07/2018   Lab Results  Component Value Date   NA 137 12/07/2018   K 4.5 12/07/2018   CO2 27 12/07/2018   GLUCOSE 87 12/07/2018   BUN 12 12/07/2018   CREATININE 0.70 12/07/2018   BILITOT 0.4 12/07/2018   ALKPHOS 42 12/07/2018   AST 13 12/07/2018   ALT 9 12/07/2018   PROT 6.5 12/07/2018   ALBUMIN 3.8 12/07/2018   CALCIUM 8.9 12/07/2018   ANIONGAP 11 05/23/2015   GFR 91.35 12/07/2018   Lab Results  Component Value Date   CHOL 235 (H) 12/07/2018   Lab Results  Component Value Date   HDL 39.40 12/07/2018     Lab Results  Component Value Date   LDLCALC 136 (H) 11/22/2012   Lab Results  Component Value Date   TRIG 248.0 (H) 12/07/2018   Lab Results  Component Value Date   CHOLHDL 6 12/07/2018   No results found for: HGBA1C     Assessment & Plan:   Problem List Items Addressed This Visit    Recurrent UTI    She was recently treated and her LLQ pain improved but now today she has some suprapubic discomfort. Check a urinalysis and culture today      Anemia   Relevant Orders   CBC w/Diff   Lipid panel   Hyperlipidemia    Encouraged heart healthy diet, increase exercise, avoid trans fats, consider a krill oil cap daily      Relevant Medications   nebivolol (BYSTOLIC) 5 MG tablet   enalapril (VASOTEC) 5 MG tablet   HTN (hypertension) - Primary    She  had a stomach bug/pain back in January and her BP started to climb up back then. Then about 3 weeks ago she had a bad tick bite on left thigh with fevers, chills, myalgias, target lesion. She was treated with Doxycycline and she is feeling much better but her blood pressure has spiked up since then. Numbers have been labile as well. Her Enalapril has been increased to 10 mg twice a day but numbers continue to spike with diastolics increasing above 100 at times. She has occasionally taken her Bystolic which has helped some but less than has in past. Will have her take Bystolic 5 mg daily and Enalapril 5 mg bid til seen by cardiology. She has failed numerous meds in the past.       Relevant Medications   nebivolol (BYSTOLIC) 5 MG tablet   enalapril (VASOTEC) 5 MG tablet   Other Relevant Orders   Ambulatory referral to Cardiology   Magnesium   Comprehensive metabolic panel   Hypothyroid   Relevant Medications   nebivolol (BYSTOLIC) 5 MG tablet   Other Relevant Orders   TSH   T4, free   T3, free   Vitamin B12 deficiency    Supplement and monitor      Relevant Orders   Vitamin B12   Vitamin D deficiency    Supplement and  monitor      Relevant Orders   VITAMIN D 25 Hydroxy (Vit-D Deficiency, Fractures)   DDD (degenerative disc disease), lumbar    Encouraged moist heat and gentle stretching as tolerated. May try NSAIDs and prescription meds as directed and report if symptoms worsen or seek immediate care. Attempt to keep weight down and core strong.        Other Visit Diagnoses    Tick bite, subsequent encounter       Relevant Orders   B. burgdorfi antibodies   Rocky mtn spotted fvr abs pnl(IgG+IgM)   Ehrlichia antibody panel   Suprapubic pressure       Relevant Orders   Urinalysis   Urine Culture      I have discontinued Soliana M. Bobb's doxycycline. I have also changed her enalapril. Additionally, I am having her start on nebivolol. Lastly, I am having her maintain her pentosan polysulfate, SUMAtriptan, cyclobenzaprine, meloxicam, fluconazole, albuterol, lidocaine, albuterol, amphetamine-dextroamphetamine, amphetamine-dextroamphetamine, Norethindrone Acetate-Ethinyl Estrad-FE, Luer Lock Safety Syringes, Symbicort, cyanocobalamin, acyclovir, Synthroid, and amphetamine-dextroamphetamine.  Meds ordered this encounter  Medications  . nebivolol (BYSTOLIC) 5 MG tablet    Sig: Take 1 tablet (5 mg total) by mouth daily.    Dispense:  30 tablet    Refill:  1  . enalapril (VASOTEC) 5 MG tablet    Sig: Take 1 tablet (5 mg total) by mouth 2 (two) times daily.    Dispense:  60 tablet    Refill:  5     Danise Edge, MD

## 2019-06-06 NOTE — Patient Instructions (Signed)

## 2019-06-06 NOTE — Assessment & Plan Note (Signed)
She was recently treated and her LLQ pain improved but now today she has some suprapubic discomfort. Check a urinalysis and culture today

## 2019-06-06 NOTE — Assessment & Plan Note (Signed)
She had a stomach bug/pain back in January and her BP started to climb up back then. Then about 3 weeks ago she had a bad tick bite on left thigh with fevers, chills, myalgias, target lesion. She was treated with Doxycycline and she is feeling much better but her blood pressure has spiked up since then. Numbers have been labile as well. Her Enalapril has been increased to 10 mg twice a day but numbers continue to spike with diastolics increasing above 100 at times. She has occasionally taken her Bystolic which has helped some but less than has in past. Will have her take Bystolic 5 mg daily and Enalapril 5 mg bid til seen by cardiology. She has failed numerous meds in the past.

## 2019-06-06 NOTE — Assessment & Plan Note (Signed)
Encouraged heart healthy diet, increase exercise, avoid trans fats, consider a krill oil cap daily 

## 2019-06-06 NOTE — Assessment & Plan Note (Signed)
Encouraged moist heat and gentle stretching as tolerated. May try NSAIDs and prescription meds as directed and report if symptoms worsen or seek immediate care. Attempt to keep weight down and core strong.

## 2019-06-06 NOTE — Assessment & Plan Note (Signed)
Supplement and monitor 

## 2019-06-09 LAB — URINE CULTURE
MICRO NUMBER:: 10511815
Result:: NO GROWTH
SPECIMEN QUALITY:: ADEQUATE

## 2019-06-09 LAB — B. BURGDORFI ANTIBODIES: B burgdorferi Ab IgG+IgM: <0.9 index

## 2019-06-09 LAB — EHRLICHIA ANTIBODY PANEL
E. CHAFFEENSIS AB IGG: <1:64 {titer}
E. CHAFFEENSIS AB IGM: <1:20 {titer}

## 2019-06-09 LAB — ROCKY MTN SPOTTED FVR ABS PNL(IGG+IGM)
RMSF IgG: NOT DETECTED
RMSF IgM: NOT DETECTED

## 2019-06-24 ENCOUNTER — Encounter: Payer: Self-pay | Admitting: General Practice

## 2019-07-13 ENCOUNTER — Other Ambulatory Visit: Payer: Self-pay | Admitting: Family Medicine

## 2019-08-03 ENCOUNTER — Other Ambulatory Visit: Payer: Self-pay | Admitting: Family Medicine

## 2019-08-03 MED ORDER — AMPHETAMINE-DEXTROAMPHETAMINE 20 MG PO TABS: 20.0000 mg | ORAL_TABLET | Freq: Two times a day (BID) | ORAL | 0 refills | Status: DC

## 2019-08-10 ENCOUNTER — Other Ambulatory Visit: Payer: Self-pay | Admitting: Family Medicine

## 2019-08-11 ENCOUNTER — Encounter: Payer: Self-pay | Admitting: Cardiovascular Disease

## 2019-08-11 ENCOUNTER — Ambulatory Visit: Payer: Managed Care, Other (non HMO) | Admitting: Cardiovascular Disease

## 2019-08-11 ENCOUNTER — Other Ambulatory Visit: Payer: Self-pay

## 2019-08-11 VITALS — BP 130/82 | HR 88 | Ht 61.0 in | Wt 164.0 lb

## 2019-08-11 DIAGNOSIS — R400 Somnolence: Secondary | ICD-10-CM | POA: Diagnosis not present

## 2019-08-11 DIAGNOSIS — R0683 Snoring: Secondary | ICD-10-CM

## 2019-08-11 DIAGNOSIS — Z8249 Family history of ischemic heart disease and other diseases of the circulatory system: Secondary | ICD-10-CM | POA: Diagnosis not present

## 2019-08-11 DIAGNOSIS — I10 Essential (primary) hypertension: Secondary | ICD-10-CM | POA: Diagnosis not present

## 2019-08-11 DIAGNOSIS — E781 Pure hyperglyceridemia: Secondary | ICD-10-CM

## 2019-08-11 HISTORY — DX: Pure hyperglyceridemia: E78.1

## 2019-08-11 MED ORDER — AMLODIPINE BESYLATE 5 MG PO TABS: 5.0000 mg | ORAL_TABLET | Freq: Every day | ORAL | 1 refills | Status: DC

## 2019-08-11 NOTE — Progress Notes (Signed)
Hypertension Clinic Initial Assessment:    Date:  08/11/2019   ID:  Kristen [name], DOB 1975/08/18, MRN [id number]  PCP:  Kristen Canary, MD  Cardiologist:  No primary care provider on file.  Nephrologist:  Referring MD: Kristen Canary, MD   CC: Hypertension  History of Present Illness:    Kristen [name] is a 44 y.o. female with a hx of hypertension and hyperlipidemia here to establish care in the hypertension clinic. She struggled with low BP for years.  Then her BP spiked 09/2013.  She had a TIA and saw Dr. Allyson [name].  She continued to have intermittent spiking of her BP.  She struggled with several medications and finally went on Bystolic.  Her BP was well-controlled but she gained weight and had heaviness in her L arm.  She stopped it and felt much better. Her BP started to be higher after a stomach bug and tick bite.  She was treated with enalapril and the dose was increased.  Initially it worked well but stopped being as effective.  She had to start back on Bystolic but is gaining weight, fatigued, and has heaviness in her chest.  Her blood pressure is controlled but the symptoms are manageable.  She has not had any orthopnea or PND but does sometimes have edema that she attributes to enalapril.  Overall her diet has been good. Her mom had heart disease at a young age so she is used to eating a healthy diet.  Lean meat and limited salt intake.  She cooks at home.  She doesn't drink caffeine or EtOH.  She takes magnesium, B12 injections, vitamin ADEK.  She has been taking Adderall since 8th grade.  She stopped taking it for a few days and her BP was still high.  She has a history of hypertriglyceridemia since she was in middle school.  She has taken fish oil without much change in her numbers.  She is also taking Krill oil.  What helped most was drinking warm water with lemon.  She is averse to trying statins because many family members have had muscle aches.  Of note, her father had a heart attack in his  67s.  Her brother had 2 heart attacks in his 33s.  Her mother has heart rhythm disorders.  Previous antihypertensives: HCTZ Losartan-didn't work Arts development officer- fatigue, arm heaviness Enalapril- swelling  Past Medical History:  Diagnosis Date  . Abnormal cervical cytology 09/26/2011   LGSIL in 2011, patient reports repeat pap was normal, has had intermittent abnormals with bx in past but always normalized, has had cryotherapy with good results Menarche at 17, irregular without birth control at times, very heavy LMP 08/26/2011 No gyn concerns, MGM last year normal   . ADD (attention deficit disorder)   . Allergy    seasonal  . Anemia    blood loss  . Asthma    mild, intermittent  . Bronchitis, acute 12/16/2010  . Chest pain, atypical 10/08/2010  . Chicken pox as a child  . Concussion 09/24/2010  . Contraceptive management 02/17/2011  . Decreased libido 09/26/2011  . Depression 2002   post partum  . Family history of heart disease   . Female bladder prolapse, acquired 09/24/2010  . Fibromyalgia   . Great toe pain, right 06/02/2017  . Headache 09/26/2013  . History of concussion 09/24/2010  . History of migraines 09/24/2010  . 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 HTN (hypertension) 09/24/2010  . Hyperlipidemia   . Hyperlipidemia   .  Hypertension   . Hypertriglyceridemia 08/11/2019  . Interstitial cystitis 02/18/2011  . Left shoulder pain 05/27/2016  . Lymphadenopathy 09/24/2010  . Migraine 09/24/2010  . Migraine aura, persistent, with cerebral infarct, status over 72 hours 09/24/2010  . Myalgia 06/08/2018  . Neck pain, musculoskeletal 12/16/2010  . Overweight 05/29/2015  . Overweight(278.02) 12/26/2011  . Pharyngitis 05/27/2016  . Poor concentration 12/16/2010  . Preventative health care 11/28/2012  . Recurrent UTI   . Renal lithiasis 09/24/2010  . RUQ pain 05/27/2011  . Subacute and chronic vaginitis 09/24/2010  . Thyroid disease   . Tick bite 12/04/2013  . Transient left leg weakness 05/21/2014  .  Vaginitis and vulvovaginitis 05/21/2014    Past Surgical History:  Procedure Laterality Date  . INCONTINENCE SURGERY  2003    Current Medications: Current Meds  Medication Sig  . acyclovir (ZOVIRAX) 200 MG capsule TAKE 1 CAPSULE BY MOUTH TWICE A DAY INS LIMIT OF 30 DAYS  . albuterol (VENTOLIN HFA) 108 (90 Base) MCG/ACT inhaler Inhale into the lungs every 6 (six) hours as needed for wheezing or shortness of breath.  . amphetamine-dextroamphetamine (ADDERALL) 20 MG tablet Take 1 tablet (20 mg total) by mouth 2 (two) times daily. July 2020  . Norethindrone Acetate-Ethinyl Estrad-FE (BLISOVI 24 FE) 1-20 MG-MCG(24) tablet Take 1 tablet by mouth daily.  . SYMBICORT 160-4.5 MCG/ACT inhaler TAKE 2 PUFFS BY MOUTH TWICE A DAY  . SYNTHROID 137 MCG tablet TAKE 1 TABLET BY MOUTH EVERY DAY  . SYRINGE-NEEDLE, DISP, 3 ML (LUER LOCK SAFETY SYRINGES) 25G X 1" 3 ML MISC Use as directed with b12 injections.  . [DISCONTINUED] cyanocobalamin (,VITAMIN B-12,) 1000 MCG/ML injection INJECT 1 ML INTO THE MUSCLE EVERY 7 DAYS.  . [DISCONTINUED] enalapril (VASOTEC) 5 MG tablet Take 1 tablet (5 mg total) by mouth 2 (two) times daily.  . [DISCONTINUED] nebivolol (BYSTOLIC) 5 MG tablet Take 1 tablet (5 mg total) by mouth daily.     Allergies:   Peanut-containing drug products, Influenza vaccines, and Terazol [terconazole]   Social History   Socioeconomic History  . Marital status: Married    Spouse name: Not on file  . Number of children: Not on file  . Years of education: Not on file  . Highest education level: Not on file  Occupational History  . Not on file  Tobacco Use  . Smoking status: Never Smoker  . Smokeless tobacco: Never Used  Vaping Use  . Vaping Use: Never used  Substance and Sexual Activity  . Alcohol use: No    Alcohol/week: 0.0 standard drinks    Comment: 0  . Drug use: No  . Sexual activity: Yes    Partners: Male    Birth control/protection: Pill  Other Topics Concern  . Not on file   Social History Narrative   Lives with husband 2 children in a one story home.     Works as a Teacher, adult educationmassage therapist.     Education: college.   Social Determinants of Health   Financial Resource Strain:   . Difficulty of Paying Living Expenses:   Food Insecurity:   . Worried About Programme researcher, broadcasting/film/videounning Out of Food in the Last Year:   . Baristaan Out of Food in the Last Year:   Transportation Needs:   . Freight forwarderLack of Transportation (Medical):   Marland Kitchen. Lack of Transportation (Non-Medical):   Physical Activity:   . Days of Exercise per Week:   . Minutes of Exercise per Session:   Stress:   . Feeling of  Stress :   Social Connections:   . Frequency of Communication with Friends and Family:   . Frequency of Social Gatherings with Friends and Family:   . Attends Religious Services:   . Active Member of Clubs or Organizations:   . Attends Banker Meetings:   Marland Kitchen Marital Status:      Family History: The patient's family history includes Alcohol abuse in her father and paternal grandfather; Dementia (age of onset: 43) in her maternal grandfather; Heart attack in her father and paternal grandfather; Heart attack (age of onset: 82) in her brother; Heart disease in her father, maternal grandmother, and paternal grandfather; Heart disease (age of onset: 107) in her brother; Heart failure in her maternal grandfather and maternal grandmother; Hyperlipidemia in her brother, father, mother, and paternal grandfather; Hypertension in her brother, father, and paternal grandfather; Osteoporosis in her mother; Stroke in her maternal grandfather and paternal grandfather; Valvular heart disease in her maternal grandmother.  ROS:   Please see the history of present illness.     All other systems reviewed and are negative.  EKGs/Labs/Other Studies Reviewed:    EKG:  EKG is ordered today.  The ekg ordered today demonstrates sinus rhythm.  Rate 88 bpm.  Recent Labs: 06/06/2019: ALT 17; BUN 14; Creatinine, Ser 0.78; Hemoglobin  13.7; Magnesium 2.3; Platelets 210.0; Potassium 4.2; Sodium 136; TSH 0.10   Recent Lipid Panel    Component Value Date/Time   CHOL 244 (H) 06/06/2019 1106   TRIG (H) 06/06/2019 1106    533.0 Triglyceride is over 400; calculations on Lipids are invalid.   HDL 35.60 (L) 06/06/2019 1106   CHOLHDL 7 06/06/2019 1106   VLDL 49.6 (H) 12/07/2018 1108   LDLCALC 136 (H) 11/22/2012 1416   LDLDIRECT 103.0 06/06/2019 1106    Physical Exam:    VS:  BP (!) 130/82   Pulse 88   Ht 5\' 1"  (1.549 m)   Wt 164 lb (74.4 kg)   SpO2 99%   BMI 30.99 kg/m  , BMI Body mass index is 30.99 kg/m. GENERAL:  Well appearing HEENT: Pupils equal round and reactive, fundi not visualized, oral mucosa unremarkable NECK:  No jugular venous distention, waveform within normal limits, carotid upstroke brisk and symmetric, no bruits LUNGS:  Clear to auscultation bilaterally HEART:  RRR.  PMI not displaced or sustained,S1 and S2 within normal limits, no S3, no S4, no clicks, no rubs, no murmurs ABD:  Flat, positive bowel sounds normal in frequency in pitch, no bruits, no rebound, no guarding, no midline pulsatile mass, no hepatomegaly, no splenomegaly EXT:  2 plus pulses throughout, no edema, no cyanosis no clubbing SKIN:  No rashes no nodules NEURO:  Cranial nerves II through XII grossly intact, motor grossly intact throughout PSYCH:  Cognitively intact, oriented to person place and time   ASSESSMENT:    1. Snoring   2. Essential hypertension   3. Daytime somnolence   4. Family history of early CAD   5. Hypertriglyceridemia     PLAN:    # Essential hypertension: Ms. Fahr has consistently elevated blood pressure.  She has been intolerant to multiple medications.  We will start amlodipine 5 mg daily.  She will continue to take her enalapril for the next 2 days and then stop it.  Stop Bystolic.  She does take Adderall but has been on this for many years and her blood pressure did not change after stopping it.   he  is interested in enrolling in  the PREP exercise and nutrition program through the [facility].  Continue to minimize sodium intake.  Secondary Causes of Hypertension  Medications/Herbal: OCP, steroids, stimulants, antidepressants, weight loss medication, immune suppressants, NSAIDs, sympathomimetics, alcohol, caffeine, licorice, ginseng, St. John's wort, chemo  Sleep Apnea: will order sleep study for snoring and daytime somnolence Renal artery stenosis:-renal artery Dopplers negative 11/2013 Hyperaldosteronism- (testing not indicated) Hyper/hypothyroidism (thyroid- subclinical hyperthryoid 05/2019.  Managed by Dr. Rogelia Rohrer Pheochromocytoma: (testing not indicated) Cushing's syndrome: (testing not indicated) Coarctation of the aorta (BP symmetric)  # TIA: No current symptoms.  Declines statin.  Not on aspirin.  Reassess after coronary calcium score.  # Familial hypertriglyceridemia: Triglycerides have always been very elevated.  Minimal response to fish oil and krill oil in the past.  She is not interested in statins.  We will get a coronary calcium score to better assess her true risk.  Consider Vascepa.  Disposition:    FU with MD/PharmD in 1 month    Medication Adjustments/Labs and Tests Ordered: Current medicines are reviewed at length with the patient today.  Concerns regarding medicines are outlined above.  Orders Placed This Encounter  Procedures  . CT CARDIAC SCORING  . EKG 12-Lead  . Split night study   Meds ordered this encounter  Medications  . amLODipine (NORVASC) 5 MG tablet    Sig: Take 1 tablet (5 mg total) by mouth daily.    Dispense:  90 tablet    Refill:  1     Signed, Chilton Si, MD  08/11/2019 5:39 PM    [facility]

## 2019-08-11 NOTE — Patient Instructions (Addendum)
Medication Instructions:  START AMLODIPINE 5 MG DAILY   STOP BYSTOLIC NOW  STOP ENALAPRIL STARTING Saturday    Labwork: NONE   Testing/Procedures: Your physician has recommended that you have a sleep study. This test records several body functions during sleep, including: brain activity, eye movement, oxygen and carbon dioxide blood levels, heart rate and rhythm, breathing rate and rhythm, the flow of air through your mouth and nose, snoring, body muscle movements, and chest and belly movement. THE OFFICE WILL CALL YOU TO SCHEDULE ONCE INSURANCE HAS APPROVED   CARDIAC CT    Follow-Up: 09/13/2019 AT 11:30 AM    You will receive a phone call from the PREP exercise and nutrition program to schedule an initial assessment.   Special Instructions:   MONITOR YOUR BLOOD PRESSURE TWICE A DAY, LOG IN BOOK PROVIDED. BRING BOOK AND MACHINE TO FOLLOW UP IN 1 MONTH   Consider trying Vascepa for your cholesterol DASH Eating Plan DASH stands for "Dietary Approaches to Stop Hypertension." The DASH eating plan is a healthy eating plan that has been shown to reduce high blood pressure (hypertension). It may also reduce your risk for type 2 diabetes, heart disease, and stroke. The DASH eating plan may also help with weight loss. What are tips for following this plan?  General guidelines  Avoid eating more than 2,300 mg (milligrams) of salt (sodium) a day. If you have hypertension, you may need to reduce your sodium intake to 1,500 mg a day.  Limit alcohol intake to no more than 1 drink a day for nonpregnant women and 2 drinks a day for men. One drink equals 12 oz of beer, 5 oz of wine, or 1 oz of hard liquor.  Work with your health care provider to maintain a healthy body weight or to lose weight. Ask what an ideal weight is for you.  Get at least 30 minutes of exercise that causes your heart to beat faster (aerobic exercise) most days of the week. Activities may include walking, swimming, or  biking.  Work with your health care provider or diet and nutrition specialist (dietitian) to adjust your eating plan to your individual calorie needs. Reading food labels   Check food labels for the amount of sodium per serving. Choose foods with less than 5 percent of the Daily Value of sodium. Generally, foods with less than 300 mg of sodium per serving fit into this eating plan.  To find whole grains, look for the word "whole" as the first word in the ingredient list. Shopping  Buy products labeled as "low-sodium" or "no salt added."  Buy fresh foods. Avoid canned foods and premade or frozen meals. Cooking  Avoid adding salt when cooking. Use salt-free seasonings or herbs instead of table salt or sea salt. Check with your health care provider or pharmacist before using salt substitutes.  Do not fry foods. Cook foods using healthy methods such as baking, boiling, grilling, and broiling instead.  Cook with heart-healthy oils, such as olive, canola, soybean, or sunflower oil. Meal planning  Eat a balanced diet that includes: ? 5 or more servings of fruits and vegetables each day. At each meal, try to fill half of your plate with fruits and vegetables. ? Up to 6-8 servings of whole grains each day. ? Less than 6 oz of lean meat, poultry, or fish each day. A 3-oz serving of meat is about the same size as a deck of cards. One egg equals 1 oz. ? 2 servings of low-fat  dairy each day. ? A serving of nuts, seeds, or beans 5 times each week. ? Heart-healthy fats. Healthy fats called Omega-3 fatty acids are found in foods such as flaxseeds and coldwater fish, like sardines, salmon, and mackerel.  Limit how much you eat of the following: ? Canned or prepackaged foods. ? Food that is high in trans fat, such as fried foods. ? Food that is high in saturated fat, such as fatty meat. ? Sweets, desserts, sugary drinks, and other foods with added sugar. ? Full-fat dairy products.  Do not salt  foods before eating.  Try to eat at least 2 vegetarian meals each week.  Eat more home-cooked food and less restaurant, buffet, and fast food.  When eating at a restaurant, ask that your food be prepared with less salt or no salt, if possible. What foods are recommended? The items listed may not be a complete list. Talk with your dietitian about what dietary choices are best for you. Grains Whole-grain or whole-wheat bread. Whole-grain or whole-wheat pasta. Brown rice. Orpah Cobb. Bulgur. Whole-grain and low-sodium cereals. Pita bread. Low-fat, low-sodium crackers. Whole-wheat flour tortillas. Vegetables Fresh or frozen vegetables (raw, steamed, roasted, or grilled). Low-sodium or reduced-sodium tomato and vegetable juice. Low-sodium or reduced-sodium tomato sauce and tomato paste. Low-sodium or reduced-sodium canned vegetables. Fruits All fresh, dried, or frozen fruit. Canned fruit in natural juice (without added sugar). Meat and other protein foods Skinless chicken or [place]. Ground chicken or [place]. Pork with fat trimmed off. Fish and seafood. Egg whites. Dried beans, peas, or lentils. Unsalted nuts, nut butters, and seeds. Unsalted canned beans. Lean cuts of beef with fat trimmed off. Low-sodium, lean deli meat. Dairy Low-fat (1%) or fat-free (skim) milk. Fat-free, low-fat, or reduced-fat cheeses. Nonfat, low-sodium ricotta or cottage cheese. Low-fat or nonfat yogurt. Low-fat, low-sodium cheese. Fats and oils Soft margarine without trans fats. Vegetable oil. Low-fat, reduced-fat, or light mayonnaise and salad dressings (reduced-sodium). Canola, safflower, olive, soybean, and sunflower oils. Avocado. Seasoning and other foods Herbs. Spices. Seasoning mixes without salt. Unsalted popcorn and pretzels. Fat-free sweets. What foods are not recommended? The items listed may not be a complete list. Talk with your dietitian about what dietary choices are best for you. Grains Baked goods  made with fat, such as croissants, muffins, or some breads. Dry pasta or rice meal packs. Vegetables Creamed or fried vegetables. Vegetables in a cheese sauce. Regular canned vegetables (not low-sodium or reduced-sodium). Regular canned tomato sauce and paste (not low-sodium or reduced-sodium). Regular tomato and vegetable juice (not low-sodium or reduced-sodium). Rosita Fire. Olives. Fruits Canned fruit in a light or heavy syrup. Fried fruit. Fruit in cream or butter sauce. Meat and other protein foods Fatty cuts of meat. Ribs. Fried meat. Tomasa Blase. Sausage. Bologna and other processed lunch meats. Salami. Fatback. Hotdogs. Bratwurst. Salted nuts and seeds. Canned beans with added salt. Canned or smoked fish. Whole eggs or egg yolks. Chicken or [place] with skin. Dairy Whole or 2% milk, cream, and half-and-half. Whole or full-fat cream cheese. Whole-fat or sweetened yogurt. Full-fat cheese. Nondairy creamers. Whipped toppings. Processed cheese and cheese spreads. Fats and oils Butter. Stick margarine. Lard. Shortening. Ghee. Bacon fat. Tropical oils, such as coconut, palm kernel, or palm oil. Seasoning and other foods Salted popcorn and pretzels. Onion salt, garlic salt, seasoned salt, table salt, and sea salt. Worcestershire sauce. Tartar sauce. Barbecue sauce. Teriyaki sauce. Soy sauce, including reduced-sodium. Steak sauce. Canned and packaged gravies. Fish sauce. Oyster sauce. Cocktail sauce. Horseradish that you find  on the shelf. Ketchup. Mustard. Meat flavorings and tenderizers. Bouillon cubes. Hot sauce and Tabasco sauce. Premade or packaged marinades. Premade or packaged taco seasonings. Relishes. Regular salad dressings. Where to find more information:  National Heart, Lung, and Blood Institute: PopSteam.is  American Heart Association: www.heart.org Summary  The DASH eating plan is a healthy eating plan that has been shown to reduce high blood pressure (hypertension). It may also reduce  your risk for type 2 diabetes, heart disease, and stroke.  With the DASH eating plan, you should limit salt (sodium) intake to 2,300 mg a day. If you have hypertension, you may need to reduce your sodium intake to 1,500 mg a day.  When on the DASH eating plan, aim to eat more fresh fruits and vegetables, whole grains, lean proteins, low-fat dairy, and heart-healthy fats.  Work with your health care provider or diet and nutrition specialist (dietitian) to adjust your eating plan to your individual calorie needs. This information is not intended to replace advice given to you by your health care provider. Make sure you discuss any questions you have with your health care provider. Document Released: 12/19/2010 Document Revised: 12/12/2016 Document Reviewed: 12/24/2015 Elsevier Patient Education  2020 [employer].

## 2019-08-15 ENCOUNTER — Other Ambulatory Visit: Payer: Self-pay | Admitting: Cardiovascular Disease

## 2019-08-15 ENCOUNTER — Telehealth: Payer: Self-pay | Admitting: *Deleted

## 2019-08-15 DIAGNOSIS — R400 Somnolence: Secondary | ICD-10-CM

## 2019-08-15 DIAGNOSIS — I10 Essential (primary) hypertension: Secondary | ICD-10-CM

## 2019-08-15 DIAGNOSIS — R0683 Snoring: Secondary | ICD-10-CM

## 2019-08-15 NOTE — Telephone Encounter (Signed)
PA for HST submitted to [facility] via web portal. Patient doesn't meet criteria for in lab sleep study.

## 2019-08-15 NOTE — Telephone Encounter (Signed)
Left sleep study appointment details on VM. 

## 2019-08-16 ENCOUNTER — Telehealth: Payer: Self-pay

## 2019-08-16 NOTE — Telephone Encounter (Signed)
LVMT requesting call back to discuss referral to PREP from HTN clinic

## 2019-08-23 ENCOUNTER — Other Ambulatory Visit: Payer: Self-pay | Admitting: Family Medicine

## 2019-08-23 ENCOUNTER — Encounter: Payer: Self-pay | Admitting: Family Medicine

## 2019-08-23 MED ORDER — CEPHALEXIN 500 MG PO CAPS: 500.0000 mg | ORAL_CAPSULE | Freq: Four times a day (QID) | ORAL | 0 refills | Status: DC

## 2019-08-30 ENCOUNTER — Other Ambulatory Visit: Payer: Self-pay

## 2019-08-30 ENCOUNTER — Ambulatory Visit (INDEPENDENT_AMBULATORY_CARE_PROVIDER_SITE_OTHER)
Admission: RE | Admit: 2019-08-30 | Discharge: 2019-08-30 | Disposition: A | Discharge status: Home / Self Care | Payer: Self-pay | Source: Ambulatory Visit | Attending: Cardiovascular Disease | Admitting: Cardiovascular Disease

## 2019-08-30 DIAGNOSIS — Z8249 Family history of ischemic heart disease and other diseases of the circulatory system: Secondary | ICD-10-CM

## 2019-09-02 ENCOUNTER — Telehealth: Payer: Self-pay | Admitting: *Deleted

## 2019-09-02 NOTE — Telephone Encounter (Signed)
Mychart message below received Will forward to Dr Duke Salvia for review   Hello! I hope you are well. I wanted to give you an update on how I'm responding to the amlodipine. It is not controlling my Bp at all and my heart rate is extremely high. 148/104 seems to be the average, highest days being 159/114 and 152/110. Not good!!!!! It is not usually that high even when I am not on meds. And my heart rate has inched up each day. Even resting it's in the 90's and today it was 115 when I woke up. I am normally 68-78 so that is marked increase. On a good note I feel like a normal human again! My arms do not feel like they weigh a million pounds and the heaviness is my chest is gone and I am not sleeping 15 hours a night. I lost 7 lbs in the first week off of the Bystolic. But it seems to be creeping up again. Bad side effects are that my feet and ankles  are swelling and looking purple. And I look like an alcoholic with a bright red face, I am hot all the time from my chest up.  I have also experienced some difficulty with urinating. I am not sure if that is the meds or if my IC is flaring. But it coincided with beginning the medicine so I wanted to mention it. I had to self catheterize  twice this week and have not had to do that in years. I am increasing my fluids and started some Macrobid today jic. I am willing to give this med longer to work as I am thrilled that I can move my arms and I don't ache all over. But I wanted you to know that my blood pressure is not controlled and I am having some symptoms. Let me know how to proceed. Thank you!!!!

## 2019-09-04 ENCOUNTER — Other Ambulatory Visit: Payer: Self-pay | Admitting: Family Medicine

## 2019-09-05 NOTE — Telephone Encounter (Signed)
Would consider changing amlodipine to spironolactone or rechallenging with another beta blocker like carvedilol. However, will defer to MD as pt was previously seen on 7/29 by Dr Duke Salvia and has not had follow up with PharmD yet.

## 2019-09-06 ENCOUNTER — Other Ambulatory Visit: Payer: Self-pay

## 2019-09-06 ENCOUNTER — Other Ambulatory Visit: Payer: Self-pay | Admitting: Family Medicine

## 2019-09-06 ENCOUNTER — Ambulatory Visit (HOSPITAL_BASED_OUTPATIENT_CLINIC_OR_DEPARTMENT_OTHER): Payer: Managed Care, Other (non HMO) | Attending: Cardiovascular Disease | Admitting: Cardiovascular Disease

## 2019-09-06 DIAGNOSIS — I10 Essential (primary) hypertension: Secondary | ICD-10-CM

## 2019-09-06 DIAGNOSIS — R0683 Snoring: Secondary | ICD-10-CM | POA: Diagnosis not present

## 2019-09-06 DIAGNOSIS — G4733 Obstructive sleep apnea (adult) (pediatric): Secondary | ICD-10-CM

## 2019-09-06 DIAGNOSIS — R400 Somnolence: Secondary | ICD-10-CM

## 2019-09-06 MED ORDER — AMPHETAMINE-DEXTROAMPHETAMINE 20 MG PO TABS: 20.0000 mg | ORAL_TABLET | Freq: Two times a day (BID) | ORAL | 0 refills | Status: DC

## 2019-09-06 NOTE — Telephone Encounter (Signed)
Stop amlodipine.  Start spironolactone 25 mg daily.  Check BMP in a week.

## 2019-09-06 NOTE — Telephone Encounter (Signed)
Left message to call back  

## 2019-09-06 NOTE — Telephone Encounter (Signed)
Last OV--06/06/2019 Next Scheduled OV--12/12/2019 Last refill on 06/08/2018---#60 no refills UDS/CSC on 06/02/2017

## 2019-09-08 ENCOUNTER — Encounter: Payer: Self-pay | Admitting: Family Medicine

## 2019-09-08 MED ORDER — SPIRONOLACTONE 25 MG PO TABS: 25.0000 mg | ORAL_TABLET | Freq: Every day | ORAL | 3 refills | Status: DC

## 2019-09-08 NOTE — Telephone Encounter (Signed)
Took HCTZ in past and caused kidney issues to the point she had to take something for her kidneys. Urinary retention improving with Amlodipine but still having issues. She had cath herself previously but has not had to do for about 5 days. Still feeling like she can not empty bladder completely Swelling improved  She does have appointment with Pharm D 8/31 Will forward to Dr Duke Salvia for review

## 2019-09-08 NOTE — Telephone Encounter (Signed)
Patient is returning Melinda's call. Please call back.  

## 2019-09-09 NOTE — Telephone Encounter (Signed)
There is a lot going on there.  Probably better to address in her in office appointment.

## 2019-09-09 NOTE — Telephone Encounter (Signed)
Still unable to reach

## 2019-09-09 NOTE — Telephone Encounter (Signed)
Called patient, voicemail full. Will try again later.

## 2019-09-11 ENCOUNTER — Other Ambulatory Visit: Payer: Self-pay | Admitting: Family Medicine

## 2019-09-11 DIAGNOSIS — L988 Other specified disorders of the skin and subcutaneous tissue: Secondary | ICD-10-CM

## 2019-09-11 DIAGNOSIS — N649 Disorder of breast, unspecified: Secondary | ICD-10-CM

## 2019-09-12 MED ORDER — NITROFURANTOIN MONOHYD MACRO 100 MG PO CAPS: ORAL_CAPSULE | ORAL | 2 refills | Status: DC

## 2019-09-12 MED ORDER — LIDOCAINE HCL 2 % EX GEL: CUTANEOUS | 2 refills | Status: DC

## 2019-09-12 MED ORDER — ALBUTEROL SULFATE HFA 108 (90 BASE) MCG/ACT IN AERS: 2.0000 | INHALATION_SPRAY | RESPIRATORY_TRACT | 2 refills | Status: DC | PRN

## 2019-09-12 MED ORDER — EPINEPHRINE 0.3 MG/0.3ML IJ SOAJ: 0.3000 mg | INTRAMUSCULAR | 2 refills | Status: DC | PRN

## 2019-09-12 NOTE — Telephone Encounter (Signed)
Still unable to reach  Patient has visit tomorrow

## 2019-09-13 ENCOUNTER — Ambulatory Visit (INDEPENDENT_AMBULATORY_CARE_PROVIDER_SITE_OTHER): Payer: Managed Care, Other (non HMO) | Admitting: Pharmacist

## 2019-09-13 ENCOUNTER — Other Ambulatory Visit: Payer: Self-pay

## 2019-09-13 ENCOUNTER — Other Ambulatory Visit: Payer: Self-pay | Admitting: Family Medicine

## 2019-09-13 VITALS — BP 138/110 | HR 100 | Resp 14 | Ht 62.0 in | Wt 162.0 lb

## 2019-09-13 DIAGNOSIS — I10 Essential (primary) hypertension: Secondary | ICD-10-CM | POA: Diagnosis not present

## 2019-09-13 MED ORDER — ENALAPRIL MALEATE 5 MG PO TABS: 5.0000 mg | ORAL_TABLET | Freq: Every evening | ORAL | 1 refills | Status: DC

## 2019-09-13 MED ORDER — HYDROCODONE-ACETAMINOPHEN 5-325 MG PO TABS: 1.0000 | ORAL_TABLET | Freq: Four times a day (QID) | ORAL | 0 refills | Status: DC | PRN

## 2019-09-13 MED ORDER — FLUCONAZOLE 150 MG PO TABS: 150.0000 mg | ORAL_TABLET | ORAL | 0 refills | Status: DC

## 2019-09-13 NOTE — Patient Instructions (Addendum)
Return for a  follow up appointment in 2 weeks by phone (4 weeks in office)  Check your blood pressure at home daily (if able) and keep record of the readings.  Take your BP meds as follows: *CONTINUE taking amlodipine 5mg  , change for to morning administration* *RESUME enalapril 5mg  daily at bedtime*  Bring all of your meds, your BP cuff and your record of home blood pressures to your next appointment.  Exercise as you're able, try to walk approximately 30 minutes per day.  Keep salt intake to a minimum, especially watch canned and prepared boxed foods.  Eat more fresh fruits and vegetables and fewer canned items.  Avoid eating in fast food restaurants.    HOW TO TAKE YOUR BLOOD PRESSURE: . Rest 5 minutes before taking your blood pressure. .  Don't smoke or drink caffeinated beverages for at least 30 minutes before. . Take your blood pressure before (not after) you eat. . Sit comfortably with your back supported and both feet on the floor (don't cross your legs). . Elevate your arm to heart level on a table or a desk. . Use the proper sized cuff. It should fit smoothly and snugly around your bare upper arm. There should be enough room to slip a fingertip under the cuff. The bottom edge of the cuff should be 1 inch above the crease of the elbow. . Ideally, take 3 measurements at one sitting and record the average.

## 2019-09-13 NOTE — Progress Notes (Signed)
Patient ID: Kristen [name]                 DOB: 19-Oct-1975                      MRN: [id number]     HPI: Kristen [name] is a 44 y.o. female referred by Dr. Duke Salvia to HTN clinic. PMH includes hx of TIA, migraine, hypertension ,and hyperlipidemia. Kristen [name] has a very complicated health history and is intolerant to multiple BP medication. Noted worsening urine retention shortly after initiating amlodipine, but patient reports symptoms are greatly improved and she is not entirely sure the urinary /bladder problems were caused by amlodipine. Spironolactone therapy was suggested , but never started d/t her fear to diuretics.  She is willing to continue on low dose amlodipine for now, but BP needs better control as well. Will try to avoid thiazide and loop diuretics d/t previous hx of AKI with HCTZ, and ongoing bladder prolapse issues.   Current HTN meds:  Amlodipine 5mg  daily  Previously tried:  HCTZ - AKI, bladder issues, and kidney stones  Losartan - lack therapeutic response Nebivolol 5mg   - fatigue, weight gain, arm heaviness Enalapril - worked (some ankle swelling) but stopped working after few years  BP goal: <130/80  Family History: patient's family history includes Alcohol abuse in her father and paternal grandfather; Dementia (age of onset: 105) in her maternal grandfather; Heart attack in her father and paternal grandfather; Heart attack (age of onset: 20) in her brother; Heart disease in her father, maternal grandmother, and paternal grandfather; Heart disease (age of onset: 67) in her brother; Heart failure in her maternal grandfather and maternal grandmother; Hyperlipidemia in her brother, father, mother, and paternal grandfather; Hypertension in her brother, father, and paternal grandfather; Osteoporosis in her mother; Stroke in her maternal grandfather and paternal grandfather; Valvular heart disease in her maternal grandmother.  Social History: denies tobacco use, alcohol use or caffeine  intake  Diet: lean meat, limited salt intake  Exercise: PREP exercise program  Home BP readings:  10 morning BP readings; average 147/101; HR 80-115 bpm 10 evening BP readings; average 147/102; HR 90-104 bpm  Wt Readings from Last 3 Encounters:  09/13/19 162 lb (73.5 kg)  09/06/19 160 lb (72.6 kg)  08/11/19 164 lb (74.4 kg)   BP Readings from Last 3 Encounters:  09/13/19 (!) 138/110  08/11/19 (!) 130/82  06/06/19 130/90   Pulse Readings from Last 3 Encounters:  09/13/19 100  08/11/19 88  06/06/19 80    Past Medical History:  Diagnosis Date  . Abnormal cervical cytology 09/26/2011   LGSIL in 2011, patient reports repeat pap was normal, has had intermittent abnormals with bx in past but always normalized, has had cryotherapy with good results Menarche at 17, irregular without birth control at times, very heavy LMP 08/26/2011 No gyn concerns, MGM last year normal   . ADD (attention deficit disorder)   . Allergy    seasonal  . Anemia    blood loss  . Asthma    mild, intermittent  . Bronchitis, acute 12/16/2010  . Chest pain, atypical 10/08/2010  . Chicken pox as a child  . Concussion 09/24/2010  . Contraceptive management 02/17/2011  . Decreased libido 09/26/2011  . Depression 2002   post partum  . Family history of heart disease   . Female bladder prolapse, acquired 09/24/2010  . Fibromyalgia   . Great toe pain, right 06/02/2017  . Headache 09/26/2013  .  History of concussion 09/24/2010  . History of migraines 09/24/2010  . 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 HTN (hypertension) 09/24/2010  . Hyperlipidemia   . Hyperlipidemia   . Hypertension   . Hypertriglyceridemia 08/11/2019  . Interstitial cystitis 02/18/2011  . Left shoulder pain 05/27/2016  . Lymphadenopathy 09/24/2010  . Migraine 09/24/2010  . Migraine aura, persistent, with cerebral infarct, status over 72 hours 09/24/2010  . Myalgia 06/08/2018  . Neck pain, musculoskeletal 12/16/2010  . Overweight 05/29/2015  .  Overweight(278.02) 12/26/2011  . Pharyngitis 05/27/2016  . Poor concentration 12/16/2010  . Preventative health care 11/28/2012  . Recurrent UTI   . Renal lithiasis 09/24/2010  . RUQ pain 05/27/2011  . Subacute and chronic vaginitis 09/24/2010  . Thyroid disease   . Tick bite 12/04/2013  . Transient left leg weakness 05/21/2014  . Vaginitis and vulvovaginitis 05/21/2014    Current Outpatient Medications on File Prior to Visit  Medication Sig Dispense Refill  . acyclovir (ZOVIRAX) 200 MG capsule TAKE 1 CAPSULE BY MOUTH TWICE A DAY INS LIMIT OF 30 DAYS 60 capsule 11  . albuterol (VENTOLIN HFA) 108 (90 Base) MCG/ACT inhaler Inhale 2 puffs into the lungs every 4 (four) hours as needed for wheezing or shortness of breath. 18 g 2  . amLODipine (NORVASC) 5 MG tablet Take 1 tablet (5 mg total) by mouth daily. 90 tablet 1  . amphetamine-dextroamphetamine (ADDERALL) 20 MG tablet Take 1 tablet (20 mg total) by mouth 2 (two) times daily. August 2021 60 tablet 0  . EPINEPHrine 0.3 mg/0.3 mL IJ SOAJ injection Inject 0.3 mLs (0.3 mg total) into the muscle as needed for anaphylaxis. 2 each 2  . lidocaine (XYLOCAINE) 2 % jelly APPLY TO AFFECTED AREA DAILY AS NEEDED 20 mL 2  . nitrofurantoin, macrocrystal-monohydrate, (MACROBID) 100 MG capsule TAKE 1 CAPSULE FOR 1 DOSE AS NEEDED FOR PREVENTION OF UTI *AFTER SELF CATH, SWIMMING, HOT TUB, SEX* 90 capsule 2  . Norethindrone Acetate-Ethinyl Estrad-FE (BLISOVI 24 FE) 1-20 MG-MCG(24) tablet Take 1 tablet by mouth daily. 28 tablet 11  . SYMBICORT 160-4.5 MCG/ACT inhaler TAKE 2 PUFFS BY MOUTH TWICE A DAY 10.2 Inhaler 5  . SYNTHROID 137 MCG tablet TAKE 1 TABLET BY MOUTH EVERY DAY 30 tablet 2  . SYRINGE-NEEDLE, DISP, 3 ML (LUER LOCK SAFETY SYRINGES) 25G X 1" 3 ML MISC Use as directed with b12 injections. 12 each 5   No current facility-administered medications on file prior to visit.    Allergies  Allergen Reactions  . Peanut-Containing Drug Products Anaphylaxis  .  Influenza Vaccines Rash    Significant swelling in arm, rash, malaise, myalgias   . Terazol [Terconazole]     Burning sensation    Blood pressure (!) 138/110, pulse 100, resp. rate 14, height 5\' 2"  (1.575 m), weight 162 lb (73.5 kg), SpO2 98 %.  HTN (hypertension) Blood pressure ramains well above goal , but patient is tolerating amlodipine better now and is willing to continue current therapy. She has more energy and denies any chest or arm pain.  Spironolactone was recommended but  started d/t patient's fear of diuretics. She experienced AKI and bladder issues with HCTZ in the past.  We discuss the need to use and combination of 2 to 3 different medications to control her blood pressure and patient is willing to proceed with recommendations. Noted significantly elevated diastolic BP. We will need and ACEi or ARB to  impact diastolic pressure. Patient expereinced lack of therapeutic response to losartan and severe adverse  reactions to multiple BP medication in the past, but was abel to tolerate enalapril for over 5 years. She is also out of medical insurance from today until November 1st.  I will add enalapril 5mg  back to her regimen, continue amlodipine 5mg , follow up in 2 weeks by phone, and in 4 weeks in person. Plan to add doxazosin 2mg  to her regimen during next OV to help with BP and urinary retention management. May change enalapril to olmesartan or candesartan once medical insurance is active again. Patient has spironolactone at home and is willing to try al low dose if additional alternative is needed, but will like to leave as a last resource.   Japhet Morgenthaler Rodriguez-Guzman PharmD, BCPS, CPP [facility] [address] [place] [facility] 09/16/2019 12:15 PM

## 2019-09-16 ENCOUNTER — Encounter: Payer: Self-pay | Admitting: Pharmacist

## 2019-09-16 NOTE — Assessment & Plan Note (Signed)
Blood pressure ramains well above goal , but patient is tolerating amlodipine better now and is willing to continue current therapy. She has more energy and denies any chest or arm pain.  Spironolactone was recommended but  started d/t patient's fear of diuretics. She experienced AKI and bladder issues with HCTZ in the past.  We discuss the need to use and combination of 2 to 3 different medications to control her blood pressure and patient is willing to proceed with recommendations. Noted significantly elevated diastolic BP. We will need and ACEi or ARB to  impact diastolic pressure. Patient expereinced lack of therapeutic response to losartan and severe adverse reactions to multiple BP medication in the past, but was abel to tolerate enalapril for over 5 years. She is also out of medical insurance from today until November 1st.  I will add enalapril 5mg  back to her regimen, continue amlodipine 5mg , follow up in 2 weeks by phone, and in 4 weeks in person. Plan to add doxazosin 2mg  to her regimen during next OV to help with BP and urinary retention management. May change enalapril to olmesartan or candesartan once medical insurance is active again. Patient has spironolactone at home and is willing to try al low dose if additional alternative is needed, but will like to leave as a last resource.

## 2019-09-21 ENCOUNTER — Other Ambulatory Visit: Payer: Self-pay | Admitting: Family Medicine

## 2019-09-21 DIAGNOSIS — N649 Disorder of breast, unspecified: Secondary | ICD-10-CM

## 2019-09-21 DIAGNOSIS — L988 Other specified disorders of the skin and subcutaneous tissue: Secondary | ICD-10-CM

## 2019-09-27 ENCOUNTER — Encounter (HOSPITAL_BASED_OUTPATIENT_CLINIC_OR_DEPARTMENT_OTHER): Payer: Self-pay | Admitting: Cardiovascular Disease

## 2019-09-27 NOTE — Procedures (Signed)
     Patient Name: Kristen [name], Kristen [name] Date: 09/07/2019 Gender: Female D.O.B: Jan 20, 1975 Age (years): 43 Referring Provider: Chilton Si Height (inches): 62 Interpreting Physician: Nicki Guadalajara MD, ABSM Weight (lbs): 160 RPSGT: Elgin Sink BMI: 29 MRN: [id number] Neck Size: 13.00  CLINICAL INFORMATION Sleep Study Type: HST  Indication for sleep study: snoring, fatigue, difficult to control hypertension,   Epworth Sleepiness Score: 3  SLEEP STUDY TECHNIQUE A multi-channel overnight portable sleep study was performed. The channels recorded were: nasal airflow, thoracic respiratory movement, and oxygen saturation with a pulse oximetry. Snoring was also monitored.  MEDICATIONS Patient self administered medications include: N/A.  SLEEP ARCHITECTURE Patient was studied for 506.2 minutes. The sleep efficiency was 100.0 % and the patient was supine for 41.3%. The arousal index was 0.0 per hour.  RESPIRATORY PARAMETERS The overall AHI was 8.9 per hour, with a central apnea index of 0.0 per hour.  The oxygen nadir was 83% during sleep.  CARDIAC DATA Mean heart rate during sleep was 78.6 bpm.  IMPRESSIONS - Mild obstructive sleep apnea occurred during this study (AHI = 8.9/h). The severity of her sleep disordered breathing during REM sleep cannot be assessed on this home stiudy. - No significant central sleep apnea occurred during this study (CAI = 0.0/h). - Moderate oxygen desaturation to a nadir of 83%. - Patient snored 11.5% during the sleep.  DIAGNOSIS - Obstructive Sleep Apnea (G47.33)  RECOMMENDATIONS - In this patient with significant cardiovascular comorbidities including difficult to control hypertension, hyperlipidemia, TIA, strong FH for CAD consider an in-lab therapeutic CPAP titration to determine optimal pressure required to alleviate sleep disordered breathing. If unable to have an in-lab study, recommend Auto-PAP initiation at 6-16 cm of water. -  Effort should be made to optimize nasal and oropharyngeal patency. - A customized oral appliance may be considered as an alternative. - Avoid alcohol, sedatives and other CNS depressants that may worsen sleep apnea and disrupt normal sleep architecture. - Sleep hygiene should be reviewed to assess factors that may improve sleep quality. - Weight management and regular exercise should be initiated or continued. - Recommend a sleep clinic evaluation after CPAP initiation.    [Electronically signed] 09/27/2019 10:01 AM  Nicki Guadalajara MD, [facility], ABSM Diplomate, American Board of Sleep Medicine   NPI: [id number] [facility] PH: [phone]   FX: [phone] ACCREDITED BY THE AMERICAN ACADEMY OF SLEEP MEDICINE

## 2019-10-04 ENCOUNTER — Telehealth: Payer: Self-pay | Admitting: *Deleted

## 2019-10-04 NOTE — Telephone Encounter (Signed)
Left message call was in reference to her sleep study results.will try again later.

## 2019-10-10 NOTE — Telephone Encounter (Signed)
Left message call is reference to HST results and recommendations. I will reach out to her again.

## 2019-10-18 ENCOUNTER — Other Ambulatory Visit: Payer: Self-pay

## 2019-10-18 ENCOUNTER — Ambulatory Visit (INDEPENDENT_AMBULATORY_CARE_PROVIDER_SITE_OTHER): Payer: Self-pay | Admitting: Pharmacist Clinician (PhC)/ Clinical Pharmacy Specialist

## 2019-10-18 VITALS — BP 144/110 | HR 107 | Resp 14 | Ht 62.0 in | Wt 159.6 lb

## 2019-10-18 DIAGNOSIS — I10 Essential (primary) hypertension: Secondary | ICD-10-CM

## 2019-10-18 MED ORDER — SPIRONOLACTONE 25 MG PO TABS: 12.5000 mg | ORAL_TABLET | Freq: Every day | ORAL | 3 refills | Status: DC

## 2019-10-18 NOTE — Progress Notes (Signed)
Patient ID: Kristen [name]                 DOB: 1975-04-21                      MRN: [id number]     HPI: Kristen [name] is a 44 y.o. female referred by Dr. Duke Salvia to HTN clinic. PMH includes hx of TIA, migraine, hypertension ,and hyperlipidemia. [name] has a very complicated health history and is intolerant to multiple BP medication. Noted worsening urine retention shortly after initiating amlodipine, but patient reports symptoms are greatly improved and she is not entirely sure the urinary /bladder problems were caused by amlodipine. Spironolactone therapy was suggested , but never started d/t her fear to diuretics.  She is willing to continue on low dose amlodipine for now, but BP needs better control as well. Will try to avoid thiazide and loop diuretics d/t previous hx of AKI with HCTZ, and ongoing bladder prolapse issues.   At her last visit in [facility] her BP was 138/110.  Today she returns for follow up.  Her home readings improved slightly, but patient reports that she feels the amlodipine is just not right for her.  Having occasional edema, feels her legs "tighten" when she squats and had an episode with blood vessels in her legs bulging.  She would prefer to stop this medication.  She also notes that the enalapril decreases her energy levels, although not as much as nebivolol.  Recently she had dry needling done on her neck and noted that her BP dropped significantly (from 160's/90-100's to 110-120/70s) for about 3 days before going back up.  She is having this procedure done again in another week or two and will watch to see if BP changes happen again.    Current HTN meds:  Amlodipine 5mg  daily, enalapril 5 mg daily  Previously tried:  HCTZ - AKI, bladder issues, and kidney stones  Losartan - lack therapeutic response Nebivolol 5mg   - fatigue, weight gain, arm heaviness Enalapril - worked (some ankle swelling) but stopped working after few years  BP goal: <130/80  Family History: patient's family  history includes Alcohol abuse in her father and paternal grandfather; Dementia (age of onset: 19) in her maternal grandfather; Heart attack in her father and paternal grandfather; Heart attack (age of onset: 27) in her brother; Heart disease in her father, maternal grandmother, and paternal grandfather; Heart disease (age of onset: 90) in her brother; Heart failure in her maternal grandfather and maternal grandmother; Hyperlipidemia in her brother, father, mother, and paternal grandfather; Hypertension in her brother, father, and paternal grandfather; Osteoporosis in her mother; Stroke in her maternal grandfather and paternal grandfather; Valvular heart disease in her maternal grandmother.  Social History: denies tobacco use, alcohol use or caffeine intake  Diet: lean meat, limited salt intake  Exercise: PREP exercise program  Labs:  5/21:  Na 136, K 4.2, Glu 90, BUN 14, SCr 0.78  Home BP readings:  Last 10 readings average 145/84 in the mornings and 4 evening readings 140/100  (no significant change from last visit)   Wt Readings from Last 3 Encounters:  10/18/19 159 lb 9.6 oz (72.4 kg)  09/13/19 162 lb (73.5 kg)  09/06/19 160 lb (72.6 kg)   BP Readings from Last 3 Encounters:  10/18/19 (!) 144/110  09/13/19 (!) 138/110  08/11/19 (!) 130/82   Pulse Readings from Last 3 Encounters:  10/18/19 (!) 107  09/13/19 100  08/11/19  88    Past Medical History:  Diagnosis Date  . Abnormal cervical cytology 09/26/2011   LGSIL in 2011, patient reports repeat pap was normal, has had intermittent abnormals with bx in past but always normalized, has had cryotherapy with good results Menarche at 17, irregular without birth control at times, very heavy LMP 08/26/2011 No gyn concerns, MGM last year normal   . ADD (attention deficit disorder)   . Allergy    seasonal  . Anemia    blood loss  . Asthma    mild, intermittent  . Bronchitis, acute 12/16/2010  . Chest pain, atypical 10/08/2010  .  Chicken pox as a child  . Concussion 09/24/2010  . Contraceptive management 02/17/2011  . Decreased libido 09/26/2011  . Depression 2002   post partum  . Family history of heart disease   . Female bladder prolapse, acquired 09/24/2010  . Fibromyalgia   . Great toe pain, right 06/02/2017  . Headache 09/26/2013  . History of concussion 09/24/2010  . History of migraines 09/24/2010  . 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 HTN (hypertension) 09/24/2010  . Hyperlipidemia   . Hyperlipidemia   . Hypertension   . Hypertriglyceridemia 08/11/2019  . Interstitial cystitis 02/18/2011  . Left shoulder pain 05/27/2016  . Lymphadenopathy 09/24/2010  . Migraine 09/24/2010  . Migraine aura, persistent, with cerebral infarct, status over 72 hours 09/24/2010  . Myalgia 06/08/2018  . Neck pain, musculoskeletal 12/16/2010  . Overweight 05/29/2015  . Overweight(278.02) 12/26/2011  . Pharyngitis 05/27/2016  . Poor concentration 12/16/2010  . Preventative health care 11/28/2012  . Recurrent UTI   . Renal lithiasis 09/24/2010  . RUQ pain 05/27/2011  . Subacute and chronic vaginitis 09/24/2010  . Thyroid disease   . Tick bite 12/04/2013  . Transient left leg weakness 05/21/2014  . Vaginitis and vulvovaginitis 05/21/2014    Current Outpatient Medications on File Prior to Visit  Medication Sig Dispense Refill  . acyclovir (ZOVIRAX) 200 MG capsule TAKE 1 CAPSULE BY MOUTH TWICE A DAY INS LIMIT OF 30 DAYS 60 capsule 11  . albuterol (VENTOLIN HFA) 108 (90 Base) MCG/ACT inhaler Inhale 2 puffs into the lungs every 4 (four) hours as needed for wheezing or shortness of breath. 18 g 2  . amLODipine (NORVASC) 5 MG tablet Take 1 tablet (5 mg total) by mouth daily. 90 tablet 1  . amphetamine-dextroamphetamine (ADDERALL) 20 MG tablet Take 1 tablet (20 mg total) by mouth 2 (two) times daily. August 2021 60 tablet 0  . enalapril (VASOTEC) 5 MG tablet Take 1 tablet (5 mg total) by mouth every evening. 90 tablet 1  . EPINEPHrine 0.3 mg/0.3 mL IJ SOAJ  injection Inject 0.3 mLs (0.3 mg total) into the muscle as needed for anaphylaxis. 2 each 2  . fluconazole (DIFLUCAN) 150 MG tablet Take 1 tablet (150 mg total) by mouth once a week. 12 tablet 0  . HYDROcodone-acetaminophen (NORCO) 5-325 MG tablet Take 1 tablet by mouth every 6 (six) hours as needed for moderate pain or severe pain. 90 tablet 0  . lidocaine (XYLOCAINE) 2 % jelly APPLY TO AFFECTED AREA DAILY AS NEEDED 20 mL 2  . nitrofurantoin, macrocrystal-monohydrate, (MACROBID) 100 MG capsule TAKE 1 CAPSULE FOR 1 DOSE AS NEEDED FOR PREVENTION OF UTI *AFTER SELF CATH, SWIMMING, HOT TUB, SEX* 90 capsule 2  . Norethindrone Acetate-Ethinyl Estrad-FE (BLISOVI 24 FE) 1-20 MG-MCG(24) tablet Take 1 tablet by mouth daily. 28 tablet 11  . SYMBICORT 160-4.5 MCG/ACT inhaler TAKE 2 PUFFS BY MOUTH TWICE  A DAY 10.2 Inhaler 5  . SYNTHROID 137 MCG tablet TAKE 1 TABLET BY MOUTH EVERY DAY 30 tablet 2  . SYRINGE-NEEDLE, DISP, 3 ML (LUER LOCK SAFETY SYRINGES) 25G X 1" 3 ML MISC Use as directed with b12 injections. 12 each 5   No current facility-administered medications on file prior to visit.    Allergies  Allergen Reactions  . Peanut-Containing Drug Products Anaphylaxis  . Influenza Vaccines Rash    Significant swelling in arm, rash, malaise, myalgias   . Terazol [Terconazole]     Burning sensation    Blood pressure (!) 144/110, pulse (!) 107, resp. rate 14, height 5\' 2"  (1.575 m), weight 159 lb 9.6 oz (72.4 kg), SpO2 98 %.  HTN (hypertension) Patient with systolic/diastolic hypertension, not well controlled and intolerant of multiple medications.  She wants to discontinue amlodipine, but willing to continue with enalapril for the time being.  Reviewed other options for her including spironolactone, olmesartan and doxazosin.  She is willing to try spironolactone 12.5 mg daily.  She is uninsured for the month of October, so will go to the lab on Nov 1 or 2 to get follow up metabolic panel.  We will see her  about a week later for follow up.     Nov 3 PharmD CPP [facility] [address] [place] 10/18/2019 5:04 PM

## 2019-10-18 NOTE — Patient Instructions (Signed)
Go to the lab in the first few days of November to get blood work - check kidney function and electrolytes  Check your blood pressure at home daily (if able) and keep record of the readings.  Take your BP meds as follows:  Stop amlodipine  Start spironolactone 12.5 mg (1/2 tablet) daily in the mornings  Bring all of your meds, your BP cuff and your record of home blood pressures to your next appointment.  Exercise as you're able, try to walk approximately 30 minutes per day.  Keep salt intake to a minimum, especially watch canned and prepared boxed foods.  Eat more fresh fruits and vegetables and fewer canned items.  Avoid eating in fast food restaurants.    HOW TO TAKE YOUR BLOOD PRESSURE: . Rest 5 minutes before taking your blood pressure. .  Don't smoke or drink caffeinated beverages for at least 30 minutes before. . Take your blood pressure before (not after) you eat. . Sit comfortably with your back supported and both feet on the floor (don't cross your legs). . Elevate your arm to heart level on a table or a desk. . Use the proper sized cuff. It should fit smoothly and snugly around your bare upper arm. There should be enough room to slip a fingertip under the cuff. The bottom edge of the cuff should be 1 inch above the crease of the elbow. . Ideally, take 3 measurements at one sitting and record the average.

## 2019-10-18 NOTE — Assessment & Plan Note (Signed)
Patient with systolic/diastolic hypertension, not well controlled and intolerant of multiple medications.  She wants to discontinue amlodipine, but willing to continue with enalapril for the time being.  Reviewed other options for her including spironolactone, olmesartan and doxazosin.  She is willing to try spironolactone 12.5 mg daily.  She is uninsured for the month of October, so will go to the lab on Nov 1 or 2 to get follow up metabolic panel.  We will see her about a week later for follow up.

## 2019-10-20 ENCOUNTER — Telehealth: Payer: Self-pay

## 2019-10-20 NOTE — Telephone Encounter (Signed)
LVMT requesting call back to discuss PREP class starting in OCT.

## 2019-10-24 NOTE — Telephone Encounter (Signed)
Called patient results left message to call back.

## 2019-11-18 ENCOUNTER — Telehealth: Payer: Self-pay | Admitting: *Deleted

## 2019-11-18 NOTE — Telephone Encounter (Addendum)
Informed patient of sleep study results and patient understanding was verbalized. Patient understands her sleep study showed  IMPRESSIONS - Mild obstructive sleep apnea occurred during this study (AHI = 8.9/h). The severity of her sleep disordered breathing during REM sleep cannot be assessed on this home stiudy.. - Moderate oxygen desaturation to a nadir of 83%. - Patient snored 11.5% during the sleep.  DIAGNOSIS - Obstructive Sleep Apnea (G47.33)  RECOMMENDATIONS - In this patient with significant cardiovascular comorbidities including difficult to control hypertension, hyperlipidemia, TIA, strong FH for CAD consider an in-lab therapeutic CPAP titration  PER DPR Left detailed message on voicemail and informed patient to call back with questions

## 2019-11-18 NOTE — Telephone Encounter (Signed)
-----   Message from Lennette Bihari, MD sent at 09/27/2019 10:08 AM EDT ----- Kristen [name], please notify pt, try for in-lab CPAP titration, if unable then Auto-PAP

## 2019-11-22 ENCOUNTER — Ambulatory Visit: Payer: Managed Care, Other (non HMO)

## 2019-12-01 ENCOUNTER — Other Ambulatory Visit: Payer: Self-pay | Admitting: Family Medicine

## 2019-12-01 MED ORDER — AMPHETAMINE-DEXTROAMPHETAMINE 20 MG PO TABS
20.0000 mg | ORAL_TABLET | Freq: Two times a day (BID) | ORAL | 0 refills | Status: DC
Start: 2019-12-01 — End: 2020-01-20

## 2019-12-01 NOTE — Telephone Encounter (Signed)
Requesting: Adderall 20mg  Contract: 05/17/2016 UDS: 12/01/2017 Last Visit: 06/06/2019 Next Visit: 12/12/2019  Last Refill: 09/06/2019 #60 and 0RF  Please Advise

## 2019-12-07 ENCOUNTER — Ambulatory Visit (INDEPENDENT_AMBULATORY_CARE_PROVIDER_SITE_OTHER): Payer: 59 | Admitting: Pharmacist

## 2019-12-07 ENCOUNTER — Telehealth: Payer: Self-pay | Admitting: *Deleted

## 2019-12-07 ENCOUNTER — Other Ambulatory Visit: Payer: Self-pay

## 2019-12-07 VITALS — BP 136/98 | HR 90 | Resp 16 | Ht 62.0 in | Wt 154.2 lb

## 2019-12-07 DIAGNOSIS — I10 Essential (primary) hypertension: Secondary | ICD-10-CM

## 2019-12-07 LAB — BASIC METABOLIC PANEL
BUN/Creatinine Ratio: 15 (ref 9–23)
BUN: 12 mg/dL (ref 6–24)
CO2: 23 mmol/L (ref 20–29)
Calcium: 9.8 mg/dL (ref 8.7–10.2)
Chloride: 100 mmol/L (ref 96–106)
Creatinine, Ser: 0.81 mg/dL (ref 0.57–1.00)
GFR calc Af Amer: 103 mL/min/{1.73_m2} (ref >59)
GFR calc non Af Amer: 89 mL/min/{1.73_m2} (ref >59)
Glucose: 102 mg/dL — ABNORMAL HIGH (ref 65–99)
Potassium: 4.6 mmol/L (ref 3.5–5.2)
Sodium: 136 mmol/L (ref 134–144)

## 2019-12-07 NOTE — Progress Notes (Signed)
Patient ID: Kristen [name]                 DOB: [birth year]                      MRN: [id number]     HPI: Kristen [name] is a 44 y.o. female referred by Dr. Duke Salvia to HTN clinic. PMH includes hx of TIA, migraine, hypertension ,and hyperlipidemia. Kristen [name] has a very complicated health history and is intolerant to multiple BP medication (see list below). Will try to avoid thiazide and loop diuretics d/t previous hx of AKI with HCTZ, and ongoing bladder prolapse issues.   At her last visit in [facility] her BP was 144/110 and spironolactone 12.5mg  daily was added to enalapril 5mg  therapy.  Today she returns for follow up. Patient is pleased with how she feels. Reports less nocturia, no bladder issues, and no swelling. She will like to limit enalapril at current dose d/t decreased energy levels, although not as much as nebivolol.  She increased spironolactone to 25mg  daily after having issues to cut tablet, but never got repeat BMET as instructed.  Current HTN meds:  enalapril 5 mg daily Spironolactone 25mg  daily   Previously tried:  HCTZ - AKI, bladder issues, and kidney stones  Losartan - lack therapeutic response Nebivolol 5mg   - fatigue, weight gain, arm heaviness Enalapril - worked, but stopped working after few years Amlodipine - urine retention, edema, leg tighten, elevated HR and BP  BP goal: <130/80  Family History: patient's family history includes Alcohol abuse in her father and paternal grandfather; Dementia (age of onset: 53) in her maternal grandfather; Heart attack in her father and paternal grandfather; Heart attack (age of onset: 58) in her brother; Heart disease in her father, maternal grandmother, and paternal grandfather; Heart disease (age of onset: 28) in her brother; Heart failure in her maternal grandfather and maternal grandmother; Hyperlipidemia in her brother, father, mother, and paternal grandfather; Hypertension in her brother, father, and paternal grandfather; Osteoporosis in her  mother; Stroke in her maternal grandfather and paternal grandfather; Valvular heart disease in her maternal grandmother.  Social History: denies tobacco use, alcohol use or caffeine intake  Diet: lean meat, limited salt intake  Exercise: PREP exercise program  Labs:  5/21:  Na 136, K 4.2, Glu 90, BUN 14, SCr 0.78  Home BP readings:  11 readings; average 158/103, HR range 76-93bpm  *HOME BP arm cuff - accurate with less than 99 difference from manual BP   Wt Readings from Last 3 Encounters:  12/07/19 154 lb 3.2 oz (69.9 kg)  10/18/19 159 lb 9.6 oz (72.4 kg)  09/13/19 162 lb (73.5 kg)   BP Readings from Last 3 Encounters:  12/07/19 (!) 136/98  10/18/19 (!) 144/110  09/13/19 (!) 138/110   Pulse Readings from Last 3 Encounters:  12/07/19 90  10/18/19 (!) 107  09/13/19 100    Past Medical History:  Diagnosis Date   Abnormal cervical cytology 09/26/2011   LGSIL in 2011, patient reports repeat pap was normal, has had intermittent abnormals with bx in past but always normalized, has had cryotherapy with good results Menarche at 17, irregular without birth control at times, very heavy LMP 08/26/2011 No gyn concerns, MGM last year normal    ADD (attention deficit disorder)    Allergy    seasonal   Anemia    blood loss   Asthma    mild, intermittent   Bronchitis, acute 12/16/2010  Chest pain, atypical 10/08/2010   Chicken pox as a child   Concussion 09/24/2010   Contraceptive management 02/17/2011   Decreased libido 09/26/2011   Depression 2002   post partum   Family history of heart disease    Female bladder prolapse, acquired 09/24/2010   Fibromyalgia    Great toe pain, right 06/02/2017   Headache 09/26/2013   History of concussion 09/24/2010   History of migraines 09/24/2010   HSV-2 infection 09/24/2010   HTN (hypertension) 09/24/2010   Hyperlipidemia    Hyperlipidemia    Hypertension    Hypertriglyceridemia 08/11/2019   Interstitial cystitis  02/18/2011   Left shoulder pain 05/27/2016   Lymphadenopathy 09/24/2010   Migraine 09/24/2010   Migraine aura, persistent, with cerebral infarct, status over 72 hours 09/24/2010   Myalgia 06/08/2018   Neck pain, musculoskeletal 12/16/2010   Overweight 05/29/2015   Overweight(278.02) 12/26/2011   Pharyngitis 05/27/2016   Poor concentration 12/16/2010   Preventative health care 11/28/2012   Recurrent UTI    Renal lithiasis 09/24/2010   RUQ pain 05/27/2011   Subacute and chronic vaginitis 09/24/2010   Thyroid disease    Tick bite 12/04/2013   Transient left leg weakness 05/21/2014   Vaginitis and vulvovaginitis 05/21/2014    Current Outpatient Medications on File Prior to Visit  Medication Sig Dispense Refill   acyclovir (ZOVIRAX) 200 MG capsule TAKE 1 CAPSULE BY MOUTH TWICE A DAY INS LIMIT OF 30 DAYS 60 capsule 11   albuterol (VENTOLIN HFA) 108 (90 Base) MCG/ACT inhaler Inhale 2 puffs into the lungs every 4 (four) hours as needed for wheezing or shortness of breath. 18 g 2   amphetamine-dextroamphetamine (ADDERALL) 20 MG tablet Take 1 tablet (20 mg total) by mouth 2 (two) times daily. November 2021 60 tablet 0   enalapril (VASOTEC) 5 MG tablet Take 1 tablet (5 mg total) by mouth every evening. 90 tablet 1   EPINEPHrine 0.3 mg/0.3 mL IJ SOAJ injection Inject 0.3 mLs (0.3 mg total) into the muscle as needed for anaphylaxis. 2 each 2   fluconazole (DIFLUCAN) 150 MG tablet Take 1 tablet (150 mg total) by mouth once a week. 12 tablet 0   HYDROcodone-acetaminophen (NORCO) 5-325 MG tablet Take 1 tablet by mouth every 6 (six) hours as needed for moderate pain or severe pain. 90 tablet 0   lidocaine (XYLOCAINE) 2 % jelly APPLY TO AFFECTED AREA DAILY AS NEEDED 20 mL 2   nitrofurantoin, macrocrystal-monohydrate, (MACROBID) 100 MG capsule TAKE 1 CAPSULE FOR 1 DOSE AS NEEDED FOR PREVENTION OF UTI *AFTER SELF CATH, SWIMMING, HOT TUB, SEX* 90 capsule 2   Norethindrone Acetate-Ethinyl  Estrad-FE (BLISOVI 24 FE) 1-20 MG-MCG(24) tablet Take 1 tablet by mouth daily. 28 tablet 11   spironolactone (ALDACTONE) 25 MG tablet Take 0.5 tablets (12.5 mg total) by mouth daily. 15 tablet 3   SYMBICORT 160-4.5 MCG/ACT inhaler TAKE 2 PUFFS BY MOUTH TWICE A DAY 10.2 Inhaler 5   SYNTHROID 137 MCG tablet TAKE 1 TABLET BY MOUTH EVERY DAY 30 tablet 2   SYRINGE-NEEDLE, DISP, 3 ML (LUER LOCK SAFETY SYRINGES) 25G X 1" 3 ML MISC Use as directed with b12 injections. 12 each 5   No current facility-administered medications on file prior to visit.    Allergies  Allergen Reactions   Peanut-Containing Drug Products Anaphylaxis   Influenza Vaccines Rash    Significant swelling in arm, rash, malaise, myalgias    Terazol [Terconazole]     Burning sensation    Blood pressure Marland Kitchen)  136/98, pulse 90, resp. rate 16, height 5\' 2"  (1.575 m), weight 154 lb 3.2 oz (69.9 kg), SpO2 99 %.  HTN (hypertension) Blood pressure remains above goal of <130/80, but diastolic pressure decreased from previous readings. Patient also reports feeling "betterthan before", and will like to continue spironolactone therapy.   Will repeat BMET today. Plan to increase spironolactone to 75mg  daily if Scr and potassium remains WNL. We discussed changing enalapril to candesartan or olmesartan as a next step on her BP management. Also noted sleep study showed OSA and needs to schedule CPAP titration. Patient will see Dr in 4-5 weeks, and contact office to schedule CPAP titration ASAP.  Clinton Wahlberg Rodriguez-Guzman PharmD, BCPS, CPP [facility] [address] [place] [address] 12/07/2019 11:47 AM

## 2019-12-07 NOTE — Patient Instructions (Addendum)
Return for a  follow up appointment in 4 -5 weeks (Dr Duke Salvia)  Go to the lab  TODAY  Check your blood pressure at home daily (if able) and keep record of the readings.  Take your BP meds as follows: *NO CHANGES* until we get blood work results. Plan to increase spironolactone to 1 and 1/2 tablet if renal function and potassium level remains within minirmal level.  Bring all of your meds, your BP cuff and your record of home blood pressures to your next appointment.  Exercise as you're able, try to walk approximately 30 minutes per day.  Keep salt intake to a minimum, especially watch canned and prepared boxed foods.  Eat more fresh fruits and vegetables and fewer canned items.  Avoid eating in fast food restaurants.    HOW TO TAKE YOUR BLOOD PRESSURE: . Rest 5 minutes before taking your blood pressure. .  Don't smoke or drink caffeinated beverages for at least 30 minutes before. . Take your blood pressure before (not after) you eat. . Sit comfortably with your back supported and both feet on the floor (don't cross your legs). . Elevate your arm to heart level on a table or a desk. . Use the proper sized cuff. It should fit smoothly and snugly around your bare upper arm. There should be enough room to slip a fingertip under the cuff. The bottom edge of the cuff should be 1 inch above the crease of the elbow. . Ideally, take 3 measurements at one sitting and record the average.

## 2019-12-07 NOTE — Telephone Encounter (Signed)
-----   Message from Pearletha Furl, RPH-CPP sent at 12/07/2019 11:21 AM EST ----- Regarding: ADV HTN Ms Dascoli needs a f/u appointment with DR Duke Salvia.  Okay to schedule any Tuesday or Thursday. She still waiting to get CPAP titration.   Thanks

## 2019-12-07 NOTE — Telephone Encounter (Signed)
Left message to call back PATIENT NEEDS ADV HTN CLINIC FOLLOW UP/ MUST BE SCHEDULED IN DESIGNATED APPT

## 2019-12-07 NOTE — Assessment & Plan Note (Addendum)
Blood pressure remains above goal of <130/80, but diastolic pressure decreased from previous readings. Patient also reports feeling "betterthan before", and will like to continue spironolactone therapy.   Will repeat BMET today. Plan to increase spironolactone to 75mg  daily if Scr and potassium remains WNL. We discussed changing enalapril to candesartan or olmesartan as a next step on her BP management. Also noted sleep study showed OSA and needs to schedule CPAP titration. Patient will see Dr in 4-5 weeks, and contact office to schedule CPAP titration ASAP.

## 2019-12-10 ENCOUNTER — Other Ambulatory Visit: Payer: Self-pay | Admitting: Cardiovascular Disease

## 2019-12-10 ENCOUNTER — Other Ambulatory Visit: Payer: Self-pay | Admitting: Family Medicine

## 2019-12-12 ENCOUNTER — Other Ambulatory Visit: Payer: Self-pay

## 2019-12-12 ENCOUNTER — Ambulatory Visit (INDEPENDENT_AMBULATORY_CARE_PROVIDER_SITE_OTHER): Payer: 59 | Admitting: Family Medicine

## 2019-12-12 ENCOUNTER — Telehealth: Payer: Self-pay

## 2019-12-12 ENCOUNTER — Encounter: Payer: Self-pay | Admitting: Family Medicine

## 2019-12-12 VITALS — BP 122/74 | HR 80 | Temp 97.8°F | Resp 16 | Wt 158.6 lb

## 2019-12-12 DIAGNOSIS — E538 Deficiency of other specified B group vitamins: Secondary | ICD-10-CM

## 2019-12-12 DIAGNOSIS — N644 Mastodynia: Secondary | ICD-10-CM | POA: Insufficient documentation

## 2019-12-12 DIAGNOSIS — E782 Mixed hyperlipidemia: Secondary | ICD-10-CM | POA: Diagnosis not present

## 2019-12-12 DIAGNOSIS — Z0000 Encounter for general adult medical examination without abnormal findings: Secondary | ICD-10-CM

## 2019-12-12 DIAGNOSIS — E559 Vitamin D deficiency, unspecified: Secondary | ICD-10-CM

## 2019-12-12 DIAGNOSIS — I10 Essential (primary) hypertension: Secondary | ICD-10-CM | POA: Diagnosis not present

## 2019-12-12 DIAGNOSIS — J351 Hypertrophy of tonsils: Secondary | ICD-10-CM | POA: Insufficient documentation

## 2019-12-12 DIAGNOSIS — E039 Hypothyroidism, unspecified: Secondary | ICD-10-CM

## 2019-12-12 LAB — COMPREHENSIVE METABOLIC PANEL
ALT: 11 U/L (ref 0–35)
AST: 13 U/L (ref 0–37)
Albumin: 3.9 g/dL (ref 3.5–5.2)
Alkaline Phosphatase: 38 U/L — ABNORMAL LOW (ref 39–117)
BUN: 13 mg/dL (ref 6–23)
CO2: 27 mEq/L (ref 19–32)
Calcium: 9 mg/dL (ref 8.4–10.5)
Chloride: 103 mEq/L (ref 96–112)
Creatinine, Ser: 0.76 mg/dL (ref 0.40–1.20)
GFR: 95.6 mL/min (ref >60.00)
Glucose, Bld: 91 mg/dL (ref 70–99)
Potassium: 4.2 mEq/L (ref 3.5–5.1)
Sodium: 137 mEq/L (ref 135–145)
Total Bilirubin: 0.4 mg/dL (ref 0.2–1.2)
Total Protein: 6.5 g/dL (ref 6.0–8.3)

## 2019-12-12 LAB — LDL CHOLESTEROL, DIRECT: Direct LDL: 128 mg/dL

## 2019-12-12 LAB — CBC WITH DIFFERENTIAL/PLATELET
Basophils Absolute: 0.1 10*3/uL (ref 0.0–0.1)
Basophils Relative: 0.7 % (ref 0.0–3.0)
Eosinophils Absolute: 0.2 10*3/uL (ref 0.0–0.7)
Eosinophils Relative: 2.2 % (ref 0.0–5.0)
HCT: 42.4 % (ref 36.0–46.0)
Hemoglobin: 14.3 g/dL (ref 12.0–15.0)
Lymphocytes Relative: 33.6 % (ref 12.0–46.0)
Lymphs Abs: 3.2 10*3/uL (ref 0.7–4.0)
MCHC: 33.8 g/dL (ref 30.0–36.0)
MCV: 91.4 fl (ref 78.0–100.0)
Monocytes Absolute: 0.6 10*3/uL (ref 0.1–1.0)
Monocytes Relative: 6 % (ref 3.0–12.0)
Neutro Abs: 5.4 10*3/uL (ref 1.4–7.7)
Neutrophils Relative %: 57.5 % (ref 43.0–77.0)
Platelets: 205 10*3/uL (ref 150.0–400.0)
RBC: 4.64 Mil/uL (ref 3.87–5.11)
RDW: 13 % (ref 11.5–15.5)
WBC: 9.5 10*3/uL (ref 4.0–10.5)

## 2019-12-12 LAB — LIPID PANEL
Cholesterol: 253 mg/dL — ABNORMAL HIGH (ref 0–200)
HDL: 37 mg/dL — ABNORMAL LOW (ref >39.00)
Total CHOL/HDL Ratio: 7
Triglycerides: 528 mg/dL — ABNORMAL HIGH (ref 0.0–149.0)

## 2019-12-12 LAB — TSH: TSH: 0.28 u[IU]/mL — ABNORMAL LOW (ref 0.35–4.50)

## 2019-12-12 LAB — VITAMIN D 25 HYDROXY (VIT D DEFICIENCY, FRACTURES): VITD: 34.88 ng/mL (ref 30.00–100.00)

## 2019-12-12 LAB — T3, FREE: T3, Free: 2.9 pg/mL (ref 2.3–4.2)

## 2019-12-12 LAB — VITAMIN B12: Vitamin B-12: 173 pg/mL — ABNORMAL LOW (ref 211–911)

## 2019-12-12 LAB — T4, FREE: Free T4: 0.94 ng/dL (ref 0.60–1.60)

## 2019-12-12 MED ORDER — SPIRONOLACTONE 50 MG PO TABS: 50.0000 mg | ORAL_TABLET | Freq: Every day | ORAL | 1 refills | Status: DC

## 2019-12-12 NOTE — Progress Notes (Signed)
Subjective:    Patient ID: Kristen [name], female    DOB: 02/28/1975, 44 y.o.   MRN: [id number]  Chief Complaint  Patient presents with  . Annual Exam    HPI Patient is in today for preventative health care and follow up on chronic medical concerns. Denies CP/palp/SOB/HA/congestion/fevers/GI or GU c/o. Taking meds as prescribed. She continues to have trouble with getting her high blood pressure under control and is following closely with cardiology's HTN clinic. Feels much better without Bystolic. Struggled with significant weight gain and fatigue on this medication. She was seen at a urgent care for mastitis of left breast but never went to follow up with the ultrasound of her left breast as scheduled due to loosing insurance coverage but will do so soon. Patient continues to not be interested in COVID vaccination and did have a mild COVID case in the past. 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 She tries to maintain a heart healthy diet. Denies CP/palp/SOB/HA/congestion/fevers/GI or GU c/o. Taking meds as prescribed. Her abdominal pain and right wrist pain have improved with some work with a PT who practices the Graston technique.   Past Medical History:  Diagnosis Date  . Abnormal cervical cytology 09/26/2011   LGSIL in 2011, patient reports repeat pap was normal, has had intermittent abnormals with bx in past but always normalized, has had cryotherapy with good results Menarche at 17, irregular without birth control at times, very heavy LMP 08/26/2011 No gyn concerns, MGM last year normal   . ADD (attention deficit disorder)   . Allergy    seasonal  . Anemia    blood loss  . Asthma    mild, intermittent  . Bronchitis, acute 12/16/2010  . Chest pain, atypical 10/08/2010  . Chicken pox as a child  . Concussion 09/24/2010  . Contraceptive management 02/17/2011  . Decreased libido 09/26/2011  . Depression 2002   post partum  . Family history of  heart disease   . Female bladder prolapse, acquired 09/24/2010  . Fibromyalgia   . Great toe pain, right 06/02/2017  . Headache 09/26/2013  . History of concussion 09/24/2010  . History of migraines 09/24/2010  . 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 HTN (hypertension) 09/24/2010  . Hyperlipidemia   . Hyperlipidemia   . Hypertension   . Hypertriglyceridemia 08/11/2019  . Interstitial cystitis 02/18/2011  . Left shoulder pain 05/27/2016  . Lymphadenopathy 09/24/2010  . Migraine 09/24/2010  . Migraine aura, persistent, with cerebral infarct, status over 72 hours 09/24/2010  . Myalgia 06/08/2018  . Neck pain, musculoskeletal 12/16/2010  . Overweight 05/29/2015  . Overweight(278.02) 12/26/2011  . Pharyngitis 05/27/2016  . Poor concentration 12/16/2010  . Preventative health care 11/28/2012  . Recurrent UTI   . Renal lithiasis 09/24/2010  . RUQ pain 05/27/2011  . Subacute and chronic vaginitis 09/24/2010  . Thyroid disease   . Tick bite 12/04/2013  . Transient left leg weakness 05/21/2014  . Vaginitis and vulvovaginitis 05/21/2014    Past Surgical History:  Procedure Laterality Date  . INCONTINENCE SURGERY  2003    Family History  Problem Relation Age of Onset  . Heart attack Father   . Hyperlipidemia Father   . Hypertension Father   . Alcohol abuse Father   . Heart disease Father   . Heart attack Brother 45  . Hypertension Brother   . Heart disease Brother 45       MI  . Hyperlipidemia Brother   .  Dementia Maternal Grandfather 81       early stages  . Stroke Maternal Grandfather   . Heart failure Maternal Grandfather   . Heart attack Paternal Grandfather   . Stroke Paternal Grandfather   . Heart disease Paternal Grandfather   . Alcohol abuse Paternal Grandfather   . Hyperlipidemia Paternal Grandfather   . Hypertension Paternal Grandfather   . Osteoporosis Mother   . Hyperlipidemia Mother   . Heart disease Maternal Grandmother   . Heart failure Maternal Grandmother   . Valvular heart  disease Maternal Grandmother     Social History   Socioeconomic History  . Marital status: Married    Spouse name: Not on file  . Number of children: Not on file  . Years of education: Not on file  . Highest education level: Not on file  Occupational History  . Not on file  Tobacco Use  . Smoking status: Never Smoker  . Smokeless tobacco: Never Used  Vaping Use  . Vaping Use: Never used  Substance and Sexual Activity  . Alcohol use: No    Alcohol/week: 0.0 standard drinks    Comment: 0  . Drug use: No  . Sexual activity: Yes    Partners: Male    Birth control/protection: Pill  Other Topics Concern  . Not on file  Social History Narrative   Lives with husband 2 children in a one story home.     Works as a Teacher, adult education.     Education: college.   Social Determinants of Health   Financial Resource Strain:   . Difficulty of Paying Living Expenses: Not on file  Food Insecurity:   . Worried About Programme researcher, broadcasting/film/video in the Last Year: Not on file  . Ran Out of Food in the Last Year: Not on file  Transportation Needs:   . Lack of Transportation (Medical): Not on file  . Lack of Transportation (Non-Medical): Not on file  Physical Activity:   . Days of Exercise per Week: Not on file  . Minutes of Exercise per Session: Not on file  Stress:   . Feeling of Stress : Not on file  Social Connections:   . Frequency of Communication with Friends and Family: Not on file  . Frequency of Social Gatherings with Friends and Family: Not on file  . Attends Religious Services: Not on file  . Active Member of Clubs or Organizations: Not on file  . Attends Banker Meetings: Not on file  . Marital Status: Not on file  Intimate Partner Violence:   . Fear of Current or Ex-Partner: Not on file  . Emotionally Abused: Not on file  . Physically Abused: Not on file  . Sexually Abused: Not on file    Outpatient Medications Prior to Visit  Medication Sig Dispense Refill  .  acyclovir (ZOVIRAX) 200 MG capsule TAKE 1 CAPSULE BY MOUTH TWICE A DAY INS LIMIT OF 30 DAYS 60 capsule 11  . albuterol (VENTOLIN HFA) 108 (90 Base) MCG/ACT inhaler Inhale 2 puffs into the lungs every 4 (four) hours as needed for wheezing or shortness of breath. 18 g 2  . amphetamine-dextroamphetamine (ADDERALL) 20 MG tablet Take 1 tablet (20 mg total) by mouth 2 (two) times daily. November 2021 60 tablet 0  . enalapril (VASOTEC) 5 MG tablet Take 1 tablet (5 mg total) by mouth every evening. 90 tablet 1  . EPINEPHrine 0.3 mg/0.3 mL IJ SOAJ injection Inject 0.3 mLs (0.3 mg total) into the  muscle as needed for anaphylaxis. 2 each 2  . fluconazole (DIFLUCAN) 150 MG tablet Take 1 tablet (150 mg total) by mouth once a week. 12 tablet 0  . HYDROcodone-acetaminophen (NORCO) 5-325 MG tablet Take 1 tablet by mouth every 6 (six) hours as needed for moderate pain or severe pain. 90 tablet 0  . lidocaine (XYLOCAINE) 2 % jelly APPLY TO AFFECTED AREA DAILY AS NEEDED 20 mL 2  . nitrofurantoin, macrocrystal-monohydrate, (MACROBID) 100 MG capsule TAKE 1 CAPSULE FOR 1 DOSE AS NEEDED FOR PREVENTION OF UTI *AFTER SELF CATH, SWIMMING, HOT TUB, SEX* 90 capsule 2  . Norethindrone Acetate-Ethinyl Estrad-FE (BLISOVI 24 FE) 1-20 MG-MCG(24) tablet Take 1 tablet by mouth daily. 28 tablet 11  . spironolactone (ALDACTONE) 25 MG tablet Take 0.5 tablets (12.5 mg total) by mouth daily. 15 tablet 3  . SYMBICORT 160-4.5 MCG/ACT inhaler TAKE 2 PUFFS BY MOUTH TWICE A DAY 10.2 Inhaler 5  . SYNTHROID 137 MCG tablet TAKE 1 TABLET BY MOUTH EVERY DAY 30 tablet 2  . SYRINGE-NEEDLE, DISP, 3 ML (LUER LOCK SAFETY SYRINGES) 25G X 1" 3 ML MISC Use as directed with b12 injections. 12 each 5   No facility-administered medications prior to visit.    Allergies  Allergen Reactions  . Peanut-Containing Drug Products Anaphylaxis  . Influenza Vaccines Rash    Significant swelling in arm, rash, malaise, myalgias   . Terazol [Terconazole]     Burning  sensation    ROS     Objective:    Physical Exam  BP 122/74 (BP Location: Left Arm, Patient Position: Sitting, Cuff Size: Normal)   Pulse 80   Temp 97.8 F (36.6 C) (Temporal)   Resp 16   Wt 158 lb 9.6 oz (71.9 kg)   LMP 11/11/2019 (Exact Date)   SpO2 99%   BMI 29.01 kg/m  Wt Readings from Last 3 Encounters:  12/12/19 158 lb 9.6 oz (71.9 kg)  12/07/19 154 lb 3.2 oz (69.9 kg)  10/18/19 159 lb 9.6 oz (72.4 kg)    Diabetic Foot Exam - Simple   No data filed     Lab Results  Component Value Date   WBC 7.2 06/06/2019   HGB 13.7 06/06/2019   HCT 40.9 06/06/2019   PLT 210.0 06/06/2019   GLUCOSE 102 (H) 12/07/2019   CHOL 244 (H) 06/06/2019   TRIG (H) 06/06/2019    533.0 Triglyceride is over 400; calculations on Lipids are invalid.   HDL 35.60 (L) 06/06/2019   LDLDIRECT 103.0 06/06/2019   LDLCALC 136 (H) 11/22/2012   ALT 17 06/06/2019   AST 17 06/06/2019   NA 136 12/07/2019   K 4.6 12/07/2019   CL 100 12/07/2019   CREATININE 0.81 12/07/2019   BUN 12 12/07/2019   CO2 23 12/07/2019   TSH 0.10 (L) 06/06/2019   INR 1.09 05/23/2015    Lab Results  Component Value Date   TSH 0.10 (L) 06/06/2019   Lab Results  Component Value Date   WBC 7.2 06/06/2019   HGB 13.7 06/06/2019   HCT 40.9 06/06/2019   MCV 91.7 06/06/2019   PLT 210.0 06/06/2019   Lab Results  Component Value Date   NA 136 12/07/2019   K 4.6 12/07/2019   CO2 23 12/07/2019   GLUCOSE 102 (H) 12/07/2019   BUN 12 12/07/2019   CREATININE 0.81 12/07/2019   BILITOT 0.5 06/06/2019   ALKPHOS 48 06/06/2019   AST 17 06/06/2019   ALT 17 06/06/2019   PROT 6.4 06/06/2019  ALBUMIN 4.0 06/06/2019   CALCIUM 9.8 12/07/2019   ANIONGAP 11 05/23/2015   GFR 80.43 06/06/2019   Lab Results  Component Value Date   CHOL 244 (H) 06/06/2019   Lab Results  Component Value Date   HDL 35.60 (L) 06/06/2019   Lab Results  Component Value Date   LDLCALC 136 (H) 11/22/2012   Lab Results  Component Value Date     TRIG (H) 06/06/2019    533.0 Triglyceride is over 400; calculations on Lipids are invalid.   Lab Results  Component Value Date   CHOLHDL 7 06/06/2019   No results found for: HGBA1C     Assessment & Plan:   Problem List Items Addressed This Visit    Hyperlipidemia   Relevant Orders   Lipid panel   HTN (hypertension)    Continues to struggle with side effects of meds and continues to try and find a good combination working with HTN clinic and she continues to improve but not be completely.       Preventative health care   Relevant Orders   CBC with Differential/Platelet   Comprehensive metabolic panel   TSH   T3, free   T4, free   Vitamin B12 deficiency - Primary    Supplement and monitor      Relevant Orders   Vitamin B12   Vitamin D deficiency    Supplement and monitor      Relevant Orders   VITAMIN D 25 Hydroxy (Vit-D Deficiency, Fractures)   Breast pain, left    Mastitis well treated no discharge, warmth. Is still waiting for ultrasound, she was scheduled but lost her insurance so she had to reschedule. She agrees to reschedule.        Other Visit Diagnoses    Enlarged tonsils       Relevant Orders   Ambulatory referral to ENT      I am having Kristen [name] maintain her Norethindrone Acetate-Ethinyl Estrad-FE, Luer Lock Safety Syringes, Symbicort, acyclovir, Synthroid, albuterol, EPINEPHrine, nitrofurantoin (macrocrystal-monohydrate), lidocaine, HYDROcodone-acetaminophen, fluconazole, enalapril, spironolactone, and amphetamine-dextroamphetamine.  No orders of the defined types were placed in this encounter.    Danise Edge, MD

## 2019-12-12 NOTE — Telephone Encounter (Signed)
Called and expressed to pt that the potassium and kidney fx are good and to increase spironolactone to 75mg  daily and they voiced understanding

## 2019-12-12 NOTE — Assessment & Plan Note (Signed)
Patient encouraged to maintain heart healthy diet, regular exercise, adequate sleep. Consider daily probiotics. Take medications as prescribed. Tdap due next year. MGM UTD and pap UTD per patient. Sees Dr Juliene Pina at [facility] records requested.

## 2019-12-12 NOTE — Assessment & Plan Note (Signed)
Continues to struggle with side effects of meds and continues to try and find a good combination working with HTN clinic and she continues to improve but not be completely.

## 2019-12-12 NOTE — Patient Instructions (Signed)
Preventive Care 21-44 Years Old, Female Preventive care refers to visits with your health care provider and lifestyle choices that can promote health and wellness. This includes:  A yearly physical exam. This may also be called an annual well check.  Regular dental visits and eye exams.  Immunizations.  Screening for certain conditions.  Healthy lifestyle choices, such as eating a healthy diet, getting regular exercise, not using drugs or products that contain nicotine and tobacco, and limiting alcohol use. What can I expect for my preventive care visit? Physical exam Your health care provider will check your:  Height and weight. This may be used to calculate body mass index (BMI), which tells if you are at a healthy weight.  Heart rate and blood pressure.  Skin for abnormal spots. Counseling Your health care provider may ask you questions about your:  Alcohol, tobacco, and drug use.  Emotional well-being.  Home and relationship well-being.  Sexual activity.  Eating habits.  Work and work environment.  Method of birth control.  Menstrual cycle.  Pregnancy history. What immunizations do I need?  Influenza (flu) vaccine  This is recommended every year. Tetanus, diphtheria, and pertussis (Tdap) vaccine  You may need a Td booster every 10 years. Varicella (chickenpox) vaccine  You may need this if you have not been vaccinated. Human papillomavirus (HPV) vaccine  If recommended by your health care provider, you may need three doses over 6 months. Measles, mumps, and rubella (MMR) vaccine  You may need at least one dose of MMR. You may also need a second dose. Meningococcal conjugate (MenACWY) vaccine  One dose is recommended if you are age 19-21 years and a first-year college student living in a residence hall, or if you have one of several medical conditions. You may also need additional booster doses. Pneumococcal conjugate (PCV13) vaccine  You may need  this if you have certain conditions and were not previously vaccinated. Pneumococcal polysaccharide (PPSV23) vaccine  You may need one or two doses if you smoke cigarettes or if you have certain conditions. Hepatitis A vaccine  You may need this if you have certain conditions or if you travel or work in places where you may be exposed to hepatitis A. Hepatitis B vaccine  You may need this if you have certain conditions or if you travel or work in places where you may be exposed to hepatitis B. Haemophilus influenzae type b (Hib) vaccine  You may need this if you have certain conditions. You may receive vaccines as individual doses or as more than one vaccine together in one shot (combination vaccines). Talk with your health care provider about the risks and benefits of combination vaccines. What tests do I need?  Blood tests  Lipid and cholesterol levels. These may be checked every 5 years starting at age 20.  Hepatitis C test.  Hepatitis B test. Screening  Diabetes screening. This is done by checking your blood sugar (glucose) after you have not eaten for a while (fasting).  Sexually transmitted disease (STD) testing.  BRCA-related cancer screening. This may be done if you have a family history of breast, ovarian, tubal, or peritoneal cancers.  Pelvic exam and Pap test. This may be done every 3 years starting at age 21. Starting at age 30, this may be done every 5 years if you have a Pap test in combination with an HPV test. Talk with your health care provider about your test results, treatment options, and if necessary, the need for more tests.   Follow these instructions at home: Eating and drinking   Eat a diet that includes fresh fruits and vegetables, whole grains, lean protein, and low-fat dairy.  Take vitamin and mineral supplements as recommended by your health care provider.  Do not drink alcohol if: ? Your health care provider tells you not to drink. ? You are  pregnant, may be pregnant, or are planning to become pregnant.  If you drink alcohol: ? Limit how much you have to 0-1 drink a day. ? Be aware of how much alcohol is in your drink. In the [place] one drink equals one 12 oz bottle of beer (355 mL), one 5 oz glass of wine (148 mL), or one 1 oz glass of hard liquor (44 mL). Lifestyle  Take daily care of your teeth and gums.  Stay active. Exercise for at least 30 minutes on 5 or more days each week.  Do not use any products that contain nicotine or tobacco, such as cigarettes, e-cigarettes, and chewing tobacco. If you need help quitting, ask your health care provider.  If you are sexually active, practice safe sex. Use a condom or other form of birth control (contraception) in order to prevent pregnancy and STIs (sexually transmitted infections). If you plan to become pregnant, see your health care provider for a preconception visit. What's next?  Visit your health care provider once a year for a well check visit.  Ask your health care provider how often you should have your eyes and teeth checked.  Stay up to date on all vaccines. This information is not intended to replace advice given to you by your health care provider. Make sure you discuss any questions you have with your health care provider. Document Revised: 09/10/2017 Document Reviewed: 09/10/2017 Elsevier Patient Education  [place]. Mastitis  Mastitis is inflammation of the breast tissue. It occurs most often in women who are breastfeeding, but it can also affect other women, and sometimes even men. What are the causes? This condition is usually caused by a bacterial infection. Bacteria enter the breast tissue through cuts or openings in the skin. Typically, this occurs with breastfeeding because of cracked or irritated nipples. Sometimes, it can occur when there is no opening in the skin. This is usually caused by plugged milk ducts. Other causes include:  Nipple  piercing.  Some forms of breast cancer. What are the signs or symptoms? Symptoms of this condition include:  Swelling, redness, tenderness, and pain in an area of the breast. The area may also feel warm to the touch. These symptoms usually affect the upper part of the breast, toward the armpit region.  Swelling of the glands under the arm on the same side.  Fever.  Rapid pulse.  Fatigue, headache, and flu-like muscle aches. If an infection is allowed to progress, a collection of pus (abscess) may develop. How is this diagnosed? This condition can usually be diagnosed based on a physical exam and your symptoms. You may also have other tests, such as:  Blood tests to determine if your body is fighting a bacterial infection.  Mammogram or ultrasound tests to rule out other problems or diseases.  Testing of pus and other fluids. Pus from the breast may be collected and examined in the lab. If an abscess has developed, the fluid in the abscess can be removed with a needle. This test can be used to confirm the diagnosis and identify the bacteria present.  If you are breastfeeding, breast milk may be cultured  and tested for bacteria. How is this treated? Treatment for this condition may include:  Applying heat or cold compresses to the affected area.  Medicine for pain.  Antibiotic medicine to treat a bacterial infection. This is usually taken by mouth.  Self-care such as rest and increased fluid intake.  If an abscess has developed, it may be treated by removing fluid with a needle. Mastitis that occurs with breastfeeding will sometimes go away on its own, so your health care provider may choose to wait 24 hours after first seeing you to decide whether a prescription medicine is needed. You may be told of different ways to help manage breastfeeding, such as continuing to breastfeed or pump in order to ensure adequate milk flow. Follow these instructions at home: Medicines  Take  over-the-counter and prescription medicines only as told by your health care provider.  If you were prescribed an antibiotic medicine, take it as told by your health care provider. Do not stop taking the antibiotic even if you start to feel better. General instructions  Do not wear a tight or underwire bra. Wear a soft, supportive bra.  Increase your fluid intake, especially if you have a fever.  Get plenty of rest. If you are breastfeeding:  Continue to empty your breasts as often as possible either by breastfeeding or by using a breast pump. This will decrease the pressure and the pain that comes with it. Ask your health care provider if changes need to be made to your breastfeeding or pumping routine.  Keep your nipples clean and dry.  During breastfeeding, empty the first breast completely before going to the other breast. If your baby is not emptying your breasts completely, use a breast pump to empty your breasts.  Use breast massage during feeding or pumping sessions.  If directed, apply moist heat to the affected area of your breast right before breastfeeding or pumping. Use the heat source that your health care provider recommends.  If directed, put ice on the affected area of your breast right after breastfeeding or pumping: ? Put ice in a plastic bag. ? Place a towel between your skin and the bag. ? Leave the ice on for 20 minutes.  If you go back to work, pump your breasts while at work to stay within your nursing schedule.  Avoid allowing your breasts to become overly filled with milk (engorged). Contact a health care provider if:  You have pus-like discharge from the breast.  You have a fever.  Your symptoms do not improve within 2 days of starting treatment. Get help right away if:  Your pain and swelling are getting worse.  You have pain that is not controlled with medicine.  You have a red line extending from the breast toward your  armpit. Summary  Mastitis is inflammation of the breast tissue. It occurs most often in women who are breastfeeding, but it can also affect non-breastfeeding women and some men.  This condition is usually caused by a bacterial infection.  This condition may be treated with hot and cold compresses, medicines, self-care, and certain breastfeeding strategies.  If you were prescribed an antibiotic medicine, take it as told by your health care provider. Do not stop taking the antibiotic even if you start to feel better. This information is not intended to replace advice given to you by your health care provider. Make sure you discuss any questions you have with your health care provider. Document Revised: 12/12/2016 Document Reviewed: 01/22/2016 Elsevier Patient  Education  2020 [employer]  

## 2019-12-12 NOTE — Assessment & Plan Note (Signed)
Supplement and monitor 

## 2019-12-12 NOTE — Assessment & Plan Note (Addendum)
Mastitis well treated no discharge, warmth. Is still waiting for ultrasound, she was scheduled but lost her insurance so she had to reschedule. She agrees to reschedule. She believes her first antibiotic was Clindamycin which caused N/V/D quickly so she is not interested in taking it again, have listed in allergies and requested records from [facility].

## 2019-12-12 NOTE — Assessment & Plan Note (Signed)
On Levothyroxine, continue to monitor. TSH was suppressed at last check but free T4 was in middle of normal range. Will continue to monitor, check labs today

## 2019-12-12 NOTE — Telephone Encounter (Signed)
-----   Message from Raquel Rodriguez-Guzman, RPH-CPP sent at 12/12/2019  9:16 AM EST ----- Regarding: FW: SCr Please call patient and let her know is okay to increase spironolactone to 75mg  daily. Potassium and renal function is looking good.  Raquel  ----- Message ----- From: , RPH-CPP Sent: 12/12/2019 To: 12/14/2019 Rodriguez-Guzman, RPH-CPP Subject: SCr                                            Increase spironolactone to 75mg  daily if K <5

## 2019-12-12 NOTE — Telephone Encounter (Signed)
Rx has been sent to the pharmacy electronically. ° °

## 2019-12-13 NOTE — Telephone Encounter (Signed)
Spoke with patient and scheduled follow up   Patient did have labs done yesterday at PCP  Per patient TSH off and would like Dr Duke Salvia to review recommendations below from Dr Rogelia Rohrer to see if she is ok with them  Her thyroid shows her T3 and T4 have drop some despite her TSH improving. We can either add 5 mcg po daily of Cytomel or refer her to endocrinology for consideration then she should retest thyroid labs in 3 months  She does not want to see Endocrinology at this time   Her lipids were done, per patient it is usually off when thyroid off. She does not want to add anything at this time for that.   Will forward to Dr Duke Salvia for review

## 2019-12-14 NOTE — Telephone Encounter (Signed)
Advised patient, verbalized understanding   Patient did want to make sure with Raquel Pharm D she was going from Spironolactone 25 mg daily to 75 mg daily Will forward for review

## 2019-12-14 NOTE — Telephone Encounter (Signed)
Her recommendations seem reasonable to me.

## 2019-12-15 ENCOUNTER — Other Ambulatory Visit: Payer: Self-pay | Admitting: Pharmacist

## 2019-12-15 ENCOUNTER — Encounter: Payer: Self-pay | Admitting: Family Medicine

## 2019-12-15 MED ORDER — SPIRONOLACTONE 25 MG PO TABS: 37.5000 mg | ORAL_TABLET | Freq: Every day | ORAL | 1 refills | Status: DC

## 2019-12-15 NOTE — Telephone Encounter (Signed)
LMOM   Sorry aboutt the confusion. As discussed during our office visit, I want her to increase spironolactone to 1.5 tablets of 25mg  (not 75mg )   I am sending the correct prescription to the pharmacy, and will try again to reach patient to clarify instructions.

## 2019-12-15 NOTE — Telephone Encounter (Signed)
FYI--Patient is calling to follow up regarding Rx that was written incorrectly. She states she received Haleigh's message and she is able to use the pill splitter she has to split the tablets in half for the correct dosage. Patient states a call back is not necessary unless we have additional questions.

## 2019-12-16 ENCOUNTER — Other Ambulatory Visit: Payer: Self-pay | Admitting: Family Medicine

## 2019-12-16 MED ORDER — LIOTHYRONINE SODIUM 5 MCG PO TABS: 5.0000 ug | ORAL_TABLET | Freq: Every day | ORAL | 3 refills | Status: DC

## 2020-01-02 ENCOUNTER — Other Ambulatory Visit: Payer: Self-pay | Admitting: Cardiovascular Disease

## 2020-01-20 ENCOUNTER — Other Ambulatory Visit: Payer: Self-pay | Admitting: Family Medicine

## 2020-01-20 MED ORDER — AMPHETAMINE-DEXTROAMPHETAMINE 20 MG PO TABS
20.0000 mg | ORAL_TABLET | Freq: Two times a day (BID) | ORAL | 0 refills | Status: DC
Start: 2020-01-20 — End: 2020-03-20

## 2020-01-20 NOTE — Telephone Encounter (Signed)
Requesting: Adderall 20mg  Contract:  05/27/2016 UDS: 12/01/2017 Last Visit: 12/12/2019 Next Visit: 06/18/2020 Last Refill: 12/01/2019 #60 and 0RF  Please Advise

## 2020-01-23 ENCOUNTER — Encounter: Payer: Self-pay | Admitting: Cardiovascular Disease

## 2020-01-23 ENCOUNTER — Ambulatory Visit (INDEPENDENT_AMBULATORY_CARE_PROVIDER_SITE_OTHER): Payer: 59 | Admitting: Cardiovascular Disease

## 2020-01-23 ENCOUNTER — Other Ambulatory Visit: Payer: Self-pay

## 2020-01-23 VITALS — BP 142/88 | HR 98 | Ht 62.0 in | Wt 156.0 lb

## 2020-01-23 DIAGNOSIS — I10 Essential (primary) hypertension: Secondary | ICD-10-CM

## 2020-01-23 DIAGNOSIS — E782 Mixed hyperlipidemia: Secondary | ICD-10-CM

## 2020-01-23 DIAGNOSIS — R0789 Other chest pain: Secondary | ICD-10-CM | POA: Diagnosis not present

## 2020-01-23 MED ORDER — HYDRALAZINE HCL 25 MG PO TABS: 25.0000 mg | ORAL_TABLET | Freq: Two times a day (BID) | ORAL | 3 refills | Status: DC

## 2020-01-23 NOTE — Patient Instructions (Addendum)
Medication Instructions:  START HYDRALAZINE 25 MG TWICE A DAY   Labwork: NONE  Testing/Procedures: NONE  Follow-Up: 02/29/2020 AT 9:30 AM

## 2020-01-23 NOTE — Progress Notes (Signed)
Hypertension Clinic Follow up Assessment:    Date:  01/23/2020   ID:  FERRELL FLAM, DOB 04-10-1975, MRN [id number]  PCP:  Bradd Canary, MD  Cardiologist:  No primary care provider on file.  Nephrologist:  Referring MD: Bradd Canary, MD   CC: Hypertension  History of Present Illness:    Kristen [name] is a 45 y.o. female with a hx of hypertension and hyperlipidemia here to establish care in the hypertension clinic. She struggled with low BP for years.  Then her BP spiked 09/2013.  She had a TIA and saw Dr. Allyson Sabal.  She continued to have intermittent spiking of her BP.  She struggled with several medications and finally went on Bystolic.  Her BP was well-controlled but she gained weight and had heaviness in her L arm.  She stopped it and felt much better. Her BP started to be higher after a stomach bug and tick bite.  She was treated with enalapril and the dose was increased.  Initially it worked well but stopped being as effective.  She had to start back on Bystolic but is gaining weight, fatigued, and has heaviness in her chest.  Her blood pressure is controlled but the symptoms are manageable.  She has not had any orthopnea or PND but does sometimes have edema that she attributes to enalapril.  Overall her diet has been good. Her mom had heart disease at a young age so she is used to eating a healthy diet.  Lean meat and limited salt intake.  She cooks at home.  She doesn't drink caffeine or EtOH.  She takes magnesium, B12 injections, vitamin ADEK.  She has been taking Adderall since 8th grade.  She stopped taking it for a few days and her BP was still high.  She has a history of hypertriglyceridemia since she was in middle school.  She has taken fish oil without much change in her numbers.  She is also taking Krill oil.  What helped most was drinking warm water with lemon.  She is averse to trying statins because many family members have had muscle aches.  Of note, her father had a heart attack in  his 22s.  Her brother had 2 heart attacks in his 41s.  Her mother has heart rhythm disorders.  At her initial appointment she was started on amlodipine. She stopped enalapril and Bystolic. Since then she saw our pharmacist and increased spironolactone 12/2019.  She was referred to the prep program through the [facility].  For the last two weeks her BP has been higher.  Prior to this her BP was 130s/90s.  Lately it has been 140s-170/90-110.  She started feeling poorly around Christmas.  She had salty food and her BP awas 175/117.  She had a week of headache when her BP was highest.  This is slowly improving.  She got cramps with spironolactone so she is only taking 25 mg.  She has been exercising and feels well with exercise.  She denies exertional chest pain or shortness of breath.  She has no edema, orthopnea or PND.  She has been under a lot of stress.    Previous antihypertensives: HCTZ Losartan-didn't work Arts development officer- fatigue, arm heaviness Enalapril- swelling at higher dose, fatigue. amlodipine    Past Medical History:  Diagnosis Date  . Abnormal cervical cytology 09/26/2011   LGSIL in 2011, patient reports repeat pap was normal, has had intermittent abnormals with bx in past but always normalized, has had cryotherapy  with good results Menarche at 17, irregular without birth control at times, very heavy LMP 08/26/2011 No gyn concerns, MGM last year normal   . ADD (attention deficit disorder)   . Allergy    seasonal  . Anemia    blood loss  . Asthma    mild, intermittent  . Bronchitis, acute 12/16/2010  . Chest pain, atypical 10/08/2010  . Chicken pox as a child  . Concussion 09/24/2010  . Contraceptive management 02/17/2011  . Decreased libido 09/26/2011  . Depression 2002   post partum  . Family history of heart disease   . Female bladder prolapse, acquired 09/24/2010  . Fibromyalgia   . Great toe pain, right 06/02/2017  . Headache 09/26/2013  . History of concussion 09/24/2010  . History of  migraines 09/24/2010  . 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 HTN (hypertension) 09/24/2010  . Hyperlipidemia   . Hyperlipidemia   . Hypertension   . Hypertriglyceridemia 08/11/2019  . Interstitial cystitis 02/18/2011  . Left shoulder pain 05/27/2016  . Lymphadenopathy 09/24/2010  . Migraine 09/24/2010  . Migraine aura, persistent, with cerebral infarct, status over 72 hours 09/24/2010  . Myalgia 06/08/2018  . Neck pain, musculoskeletal 12/16/2010  . Overweight 05/29/2015  . Overweight(278.02) 12/26/2011  . Pharyngitis 05/27/2016  . Poor concentration 12/16/2010  . Preventative health care 11/28/2012  . Recurrent UTI   . Renal lithiasis 09/24/2010  . RUQ pain 05/27/2011  . Subacute and chronic vaginitis 09/24/2010  . Thyroid disease   . Tick bite 12/04/2013  . Transient left leg weakness 05/21/2014  . Vaginitis and vulvovaginitis 05/21/2014    Past Surgical History:  Procedure Laterality Date  . INCONTINENCE SURGERY  2003    Current Medications: Current Meds  Medication Sig  . acyclovir (ZOVIRAX) 200 MG capsule TAKE 1 CAPSULE BY MOUTH TWICE A DAY INS LIMIT OF 30 DAYS  . albuterol (VENTOLIN HFA) 108 (90 Base) MCG/ACT inhaler Inhale 2 puffs into the lungs every 4 (four) hours as needed for wheezing or shortness of breath.  . amphetamine-dextroamphetamine (ADDERALL) 20 MG tablet Take 1 tablet (20 mg total) by mouth 2 (two) times daily. November 2021  . enalapril (VASOTEC) 5 MG tablet Take 1 tablet (5 mg total) by mouth every evening.  Marland Kitchen EPINEPHrine 0.3 mg/0.3 mL IJ SOAJ injection Inject 0.3 mLs (0.3 mg total) into the muscle as needed for anaphylaxis.  . fluconazole (DIFLUCAN) 150 MG tablet Take 1 tablet (150 mg total) by mouth once a week.  . hydrALAZINE (APRESOLINE) 25 MG tablet Take 1 tablet (25 mg total) by mouth in the morning and at bedtime.  Marland Kitchen HYDROcodone-acetaminophen (NORCO) 5-325 MG tablet Take 1 tablet by mouth every 6 (six) hours as needed for moderate pain or severe pain.  Marland Kitchen lidocaine  (XYLOCAINE) 2 % jelly APPLY TO AFFECTED AREA DAILY AS NEEDED  . liothyronine (CYTOMEL) 5 MCG tablet Take 1 tablet (5 mcg total) by mouth daily.  . nitrofurantoin, macrocrystal-monohydrate, (MACROBID) 100 MG capsule TAKE 1 CAPSULE FOR 1 DOSE AS NEEDED FOR PREVENTION OF UTI *AFTER SELF CATH, SWIMMING, HOT TUB, SEX*  . Norethindrone Acetate-Ethinyl Estrad-FE (BLISOVI 24 FE) 1-20 MG-MCG(24) tablet Take 1 tablet by mouth daily.  . SYMBICORT 160-4.5 MCG/ACT inhaler TAKE 2 PUFFS BY MOUTH TWICE A DAY  . SYNTHROID 137 MCG tablet TAKE 1 TABLET BY MOUTH EVERY DAY  . SYRINGE-NEEDLE, DISP, 3 ML (LUER LOCK SAFETY SYRINGES) 25G X 1" 3 ML MISC Use as directed with b12 injections.  . [DISCONTINUED] spironolactone (ALDACTONE) 25  MG tablet Take 1.5 tablets (37.5 mg total) by mouth daily.     Allergies:   Peanut-containing drug products, Clindamycin/lincomycin, Influenza vaccines, and Terazol [terconazole]   Social History   Socioeconomic History  . Marital status: Married    Spouse name: Not on file  . Number of children: Not on file  . Years of education: Not on file  . Highest education level: Not on file  Occupational History  . Not on file  Tobacco Use  . Smoking status: Never Smoker  . Smokeless tobacco: Never Used  Vaping Use  . Vaping Use: Never used  Substance and Sexual Activity  . Alcohol use: No    Alcohol/week: 0.0 standard drinks    Comment: 0  . Drug use: No  . Sexual activity: Yes    Partners: Male    Birth control/protection: Pill  Other Topics Concern  . Not on file  Social History Narrative   Lives with husband 2 children in a one story home.     Works as a Teacher, adult educationmassage therapist.     Education: college.   Social Determinants of Health   Financial Resource Strain: Not on file  Food Insecurity: Not on file  Transportation Needs: Not on file  Physical Activity: Not on file  Stress: Not on file  Social Connections: Not on file     Family History: The patient's family  history includes Alcohol abuse in her father and paternal grandfather; Dementia (age of onset: 5392) in her maternal grandfather; Heart attack in her father and paternal grandfather; Heart attack (age of onset: 2845) in her brother; Heart disease in her father, maternal grandmother, and paternal grandfather; Heart disease (age of onset: 6845) in her brother; Heart failure in her maternal grandfather and maternal grandmother; Hyperlipidemia in her brother, father, mother, and paternal grandfather; Hypertension in her brother, father, and paternal grandfather; Osteoporosis in her mother; Stroke in her maternal grandfather and paternal grandfather; Valvular heart disease in her maternal grandmother.  ROS:   Please see the history of present illness.     All other systems reviewed and are negative.  EKGs/Labs/Other Studies Reviewed:    EKG:  EKG is not ordered today.  The ekg ordered today demonstrates sinus rhythm.  Rate 88 bpm.  Recent Labs: 06/06/2019: Magnesium 2.3 12/12/2019: ALT 11; BUN 13; Creatinine, Ser 0.76; Hemoglobin 14.3; Platelets 205.0; Potassium 4.2; Sodium 137; TSH 0.28   Recent Lipid Panel    Component Value Date/Time   CHOL 253 (H) 12/12/2019 1054   TRIG (H) 12/12/2019 1054    528.0 Triglyceride is over 400; calculations on Lipids are invalid.   HDL 37.00 (L) 12/12/2019 1054   CHOLHDL 7 12/12/2019 1054   VLDL 49.6 (H) 12/07/2018 1108   LDLCALC 136 (H) 11/22/2012 1416   LDLDIRECT 128.0 12/12/2019 1054    Physical Exam:    VS:  BP (!) 142/88   Pulse 98   Ht 5\' 2"  (1.575 m)   Wt 156 lb (70.8 kg)   SpO2 98%   BMI 28.53 kg/m  , BMI Body mass index is 28.53 kg/m. GENERAL:  Well appearing HEENT: Pupils equal round and reactive, fundi not visualized, oral mucosa unremarkable NECK:  No jugular venous distention, waveform within normal limits, carotid upstroke brisk and symmetric, no bruits LUNGS:  Clear to auscultation bilaterally HEART:  RRR.  PMI not displaced or  sustained,S1 and S2 within normal limits, no S3, no S4, no clicks, no rubs, no murmurs ABD:  Flat, positive bowel  sounds normal in frequency in pitch, no bruits, no rebound, no guarding, no midline pulsatile mass, no hepatomegaly, no splenomegaly EXT:  2 plus pulses throughout, no edema, no cyanosis no clubbing SKIN:  No rashes no nodules NEURO:  Cranial nerves II through XII grossly intact, motor grossly intact throughout PSYCH:  Cognitively intact, oriented to person place and time   ASSESSMENT:    1. Primary hypertension   2. Atypical chest pain   3. Mixed hyperlipidemia     PLAN:    # Essential hypertension: Ms. Dieu has consistently elevated blood pressure.  She has been intolerant to multiple medications.  She didn't tolerate higher doses of spironolactone or enalapril.  Start hydralazine 25mg  bid and we will titrate as needed.  Encouraged to get 150 minutes of exercise.  She has insurance and will f/u on her sleep study.  She does take Adderall but has been on this for many years and her blood pressure did not change after stopping it.  She is interested in enrolling in the PREP exercise and nutrition program through the [facility].  Continue to minimize sodium intake.  Secondary Causes of Hypertension  Medications/Herbal: OCP, steroids, stimulants, antidepressants, weight loss medication, immune suppressants, NSAIDs, sympathomimetics, alcohol, caffeine, licorice, ginseng, St. John's wort, chemo  Sleep Apnea: will order sleep study for snoring and daytime somnolence Renal artery stenosis:-renal artery Dopplers negative 11/2013 Hyperaldosteronism- (testing not indicated) Hyper/hypothyroidism (thyroid- subclinical hyperthryoid 05/2019.  Managed by Dr. 06/2019 Pheochromocytoma: (testing not indicated) Cushing's syndrome: (testing not indicated) Coarctation of the aorta (BP symmetric)  # TIA: No current symptoms.  Declines statin.  Not on aspirin.  Reassess after coronary calcium  score.  # Familial hypertriglyceridemia: Triglycerides have always been very elevated.  Minimal response to fish oil and krill oil in the past.  She is not interested in statins. Calcium score was 0.  Discuss fibrates vs. Vascepa at follow up.  Disposition:    FU with MD/PharmD in 1 month    Medication Adjustments/Labs and Tests Ordered: Current medicines are reviewed at length with the patient today.  Concerns regarding medicines are outlined above.  No orders of the defined types were placed in this encounter.  Meds ordered this encounter  Medications  . hydrALAZINE (APRESOLINE) 25 MG tablet    Sig: Take 1 tablet (25 mg total) by mouth in the morning and at bedtime.    Dispense:  270 tablet    Refill:  3     Signed, Rogelia Rohrer, MD  01/23/2020 10:27 PM    [facility]

## 2020-01-24 ENCOUNTER — Telehealth: Payer: Self-pay

## 2020-01-24 MED ORDER — DOXAZOSIN MESYLATE 2 MG PO TABS: 2.0000 mg | ORAL_TABLET | Freq: Every day | ORAL | 5 refills | Status: DC

## 2020-01-24 NOTE — Telephone Encounter (Signed)
Telephone note created and sent to Primary Nurse and Dr. Duke Salvia after speaking to primary nurse.

## 2020-01-24 NOTE — Telephone Encounter (Signed)
Received mychart note from patient:  Hello! I am sorry to have to send this message, but I cannot take the hydralazine! I took my first dose yesterday. I had great difficulty urinating this morning. Close to having to self cath. My hands were so swollen I could not bend them. They r going down but still very painful. And I also had feelings of pins in my feet when I first woke up, but that dissipated quickly. I would fight through the painful hand swelling and give it more time but I absolutely cannot deal with the urinary retention!!!! I cannot go back to that ever again! I'm afraid to take another dose. My Bp is still running high. As of a few min ago it was 153/108. I have been home resting bc I could not work w painful swollen hands. Even the bystolic didn't make them swell and hurt this badly!!! This is so frustrating! I'm sorry my body is so difficult! Let me know how you want me to proceed. Thank you! ~Kristen [name]~ Routing to MD and primary RN

## 2020-01-24 NOTE — Telephone Encounter (Signed)
Discussed with Dr Duke Salvia and will have patient try Doxazosin 2 mg daily  Left message to call back and mychart message sent to call tomorrow

## 2020-02-17 NOTE — Telephone Encounter (Signed)
Patient replied to [facility] message and was going to pick up medication

## 2020-02-29 ENCOUNTER — Ambulatory Visit (INDEPENDENT_AMBULATORY_CARE_PROVIDER_SITE_OTHER): Payer: 59 | Admitting: Pharmacist Clinician (PhC)/ Clinical Pharmacy Specialist

## 2020-02-29 ENCOUNTER — Encounter: Payer: Self-pay | Admitting: Family Medicine

## 2020-02-29 ENCOUNTER — Other Ambulatory Visit: Payer: Self-pay

## 2020-02-29 VITALS — BP 160/94 | HR 100 | Resp 16 | Ht 62.0 in | Wt 156.8 lb

## 2020-02-29 DIAGNOSIS — I10 Essential (primary) hypertension: Secondary | ICD-10-CM | POA: Diagnosis not present

## 2020-02-29 MED ORDER — OLMESARTAN MEDOXOMIL 20 MG PO TABS: 20.0000 mg | ORAL_TABLET | Freq: Every day | ORAL | 6 refills | Status: DC

## 2020-02-29 NOTE — Assessment & Plan Note (Signed)
Had blunt discussion about lack of options for lowering her pressure.  With reading in this range would expect that she will need 2-3 medications for good control, but are limited to what she will try.  Gave 2 options today, switching enalapril to olmesartan or trying carvedilol.  Discussed pros/cons of each.  Patient notes that her mother takes carvedilol and complains of fatigue, so she would rather try the olmesartan.  Will have her stop enalapril and start olmesartan 20 mg once daily.  She will need to repeat metabolic panel after 2 weeks.  We will see her back in the clinic in 6 weeks.

## 2020-02-29 NOTE — Progress Notes (Signed)
Patient ID: Kristen [name]                 DOB: [birth year]                      MRN: [id number]     HPI: Kristen [name] is a 45 y.o. female referred by Dr. Duke Salvia to HTN clinic. PMH includes hx of TIA, migraine, hypertension ,and hyperlipidemia. Kristen [name] has a very complicated health history and is intolerant to multiple BP medication (see list below). Will try to avoid thiazide and loop diuretics d/t previous hx of AKI with HCTZ, and ongoing bladder prolapse issues.   She has been seen several time in the hypertension clinic over the past 8 months and has been found to be intolerant of many medications.  At her last visit hydralazine was started, however this lasted only one dose and she was then started on doxazosin 2 mg qd.  She returns today for follow up.   After stopping hydralazine (1 dose) she reports that it took weeks to get her bladder function back to normal.  She never took the doxazosin that was then prescribed because the side effects list weight gain and fatigue (in UpToDate fatigue is 8-21%, weight gain < 1%).  She also stopped her spironolactone, as it was continuing to cause her leg cramps.  So she returns today, again only taking enalapril 5 mg.    Was rather straightforward with her today, we have very few options left to try.  Because she didn't have any side effects from losartan, out best bet would be to try a different ARB.  Also offered the option of carvedilol.  She notes that her mother takes carvedilol and has problems with fatigue, so given a choice, she would rather try the olmesartan.  She also is wondering about getting a renal artery ablation once this is available.    Current HTN meds:  enalapril 5 mg daily  Previously tried:  HCTZ - AKI, bladder issues, and kidney stones  Losartan - lack therapeutic response Nebivolol 5mg   - fatigue, weight gain, arm heaviness Enalapril - worked, but stopped working after few years Amlodipine - urine retention, edema, leg tighten,  elevated HR and BP Hydralazine - urinary retention Spironolactone - muscle cramping Doxazosin - would not take due to side effects listed in PI (weight gain, fatigue)  BP goal: <130/80  Family History: patient's family history includes Alcohol abuse in her father and paternal grandfather; Dementia (age of onset: 72) in her maternal grandfather; Heart attack in her father and paternal grandfather; Heart attack (age of onset: 73) in her brother; Heart disease in her father, maternal grandmother, and paternal grandfather; Heart disease (age of onset: 61) in her brother; Heart failure in her maternal grandfather and maternal grandmother; Hyperlipidemia in her brother, father, mother, and paternal grandfather; Hypertension in her brother, father, and paternal grandfather; Osteoporosis in her mother; Stroke in her maternal grandfather and paternal grandfather; Valvular heart disease in her maternal grandmother.  Social History: denies tobacco use, alcohol use or caffeine intake  Diet: lean meat, limited salt intake  Exercise:  walk 3-5 days per week 30-45 min, weights 2 days per week with cardio about 1 hour  Labs:  5/21:  Na 136, K 4.2, Glu 90, BUN 14, SCr 0.78  Home BP readings:  15 am readings average 164/108  15 evening readings average 169/114 At previous visit had 11 readings; average 158/103, HR range 76-93bpm  *  HOME BP arm cuff - accurate with less than difference from manual BP   Wt Readings from Last 3 Encounters:  02/29/20 156 lb 12.8 oz (71.1 kg)  01/23/20 156 lb (70.8 kg)  12/12/19 158 lb 9.6 oz (71.9 kg)   BP Readings from Last 3 Encounters:  02/29/20 (!) 160/94  01/23/20 (!) 142/88  12/12/19 122/74   Pulse Readings from Last 3 Encounters:  02/29/20 100  01/23/20 98  12/12/19 80    Past Medical History:  Diagnosis Date  . Abnormal cervical cytology 09/26/2011   LGSIL in 2011, patient reports repeat pap was normal, has had intermittent abnormals with bx in  past but always normalized, has had cryotherapy with good results Menarche at 17, irregular without birth control at times, very heavy LMP 08/26/2011 No gyn concerns, MGM last year normal   . ADD (attention deficit disorder)   . Allergy    seasonal  . Anemia    blood loss  . Asthma    mild, intermittent  . Bronchitis, acute 12/16/2010  . Chest pain, atypical 10/08/2010  . Chicken pox as a child  . Concussion 09/24/2010  . Contraceptive management 02/17/2011  . Decreased libido 09/26/2011  . Depression 2002   post partum  . Family history of heart disease   . Female bladder prolapse, acquired 09/24/2010  . Fibromyalgia   . Great toe pain, right 06/02/2017  . Headache 09/26/2013  . History of concussion 09/24/2010  . History of migraines 09/24/2010  . 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 HTN (hypertension) 09/24/2010  . Hyperlipidemia   . Hyperlipidemia   . Hypertension   . Hypertriglyceridemia 08/11/2019  . Interstitial cystitis 02/18/2011  . Left shoulder pain 05/27/2016  . Lymphadenopathy 09/24/2010  . Migraine 09/24/2010  . Migraine aura, persistent, with cerebral infarct, status over 72 hours 09/24/2010  . Myalgia 06/08/2018  . Neck pain, musculoskeletal 12/16/2010  . Overweight 05/29/2015  . Overweight(278.02) 12/26/2011  . Pharyngitis 05/27/2016  . Poor concentration 12/16/2010  . Preventative health care 11/28/2012  . Recurrent UTI   . Renal lithiasis 09/24/2010  . RUQ pain 05/27/2011  . Subacute and chronic vaginitis 09/24/2010  . Thyroid disease   . Tick bite 12/04/2013  . Transient left leg weakness 05/21/2014  . Vaginitis and vulvovaginitis 05/21/2014    Current Outpatient Medications on File Prior to Visit  Medication Sig Dispense Refill  . acyclovir (ZOVIRAX) 200 MG capsule TAKE 1 CAPSULE BY MOUTH TWICE A DAY INS LIMIT OF 30 DAYS 60 capsule 11  . albuterol (VENTOLIN HFA) 108 (90 Base) MCG/ACT inhaler Inhale 2 puffs into the lungs every 4 (four) hours as needed for wheezing or shortness of  breath. 18 g 2  . amphetamine-dextroamphetamine (ADDERALL) 20 MG tablet Take 1 tablet (20 mg total) by mouth 2 (two) times daily. November 2021 60 tablet 0  . EPINEPHrine 0.3 mg/0.3 mL IJ SOAJ injection Inject 0.3 mLs (0.3 mg total) into the muscle as needed for anaphylaxis. 2 each 2  . fluconazole (DIFLUCAN) 150 MG tablet Take 1 tablet (150 mg total) by mouth once a week. 12 tablet 0  . HYDROcodone-acetaminophen (NORCO) 5-325 MG tablet Take 1 tablet by mouth every 6 (six) hours as needed for moderate pain or severe pain. 90 tablet 0  . lidocaine (XYLOCAINE) 2 % jelly APPLY TO AFFECTED AREA DAILY AS NEEDED 20 mL 2  . liothyronine (CYTOMEL) 5 MCG tablet Take 1 tablet (5 mcg total) by mouth daily. 30 tablet 3  . nitrofurantoin,  macrocrystal-monohydrate, (MACROBID) 100 MG capsule TAKE 1 CAPSULE FOR 1 DOSE AS NEEDED FOR PREVENTION OF UTI *AFTER SELF CATH, SWIMMING, HOT TUB, SEX* 90 capsule 2  . Norethindrone Acetate-Ethinyl Estrad-FE (BLISOVI 24 FE) 1-20 MG-MCG(24) tablet Take 1 tablet by mouth daily. 28 tablet 11  . SYMBICORT 160-4.5 MCG/ACT inhaler TAKE 2 PUFFS BY MOUTH TWICE A DAY 10.2 Inhaler 5  . SYNTHROID 137 MCG tablet TAKE 1 TABLET BY MOUTH EVERY DAY 30 tablet 5  . SYRINGE-NEEDLE, DISP, 3 ML (LUER LOCK SAFETY SYRINGES) 25G X 1" 3 ML MISC Use as directed with b12 injections. (Patient not taking: Reported on 02/29/2020) 12 each 5   No current facility-administered medications on file prior to visit.    Allergies  Allergen Reactions  . Peanut-Containing Drug Products Anaphylaxis  . Clindamycin/Lincomycin Nausea And Vomiting    And diarrhea.   . Influenza Vaccines Rash    Significant swelling in arm, rash, malaise, myalgias   . Hydralazine     Urinary retention and edema   . Terazol [Terconazole]     Burning sensation    Blood pressure (!) 160/94, pulse 100, resp. rate 16, height 5\' 2"  (1.575 m), weight 156 lb 12.8 oz (71.1 kg), last menstrual period 02/10/2020, SpO2 99 %.  HTN  (hypertension) Had blunt discussion about lack of options for lowering her pressure.  With reading in this range would expect that she will need 2-3 medications for good control, but are limited to what she will try.  Gave 2 options today, switching enalapril to olmesartan or trying carvedilol.  Discussed pros/cons of each.  Patient notes that her mother takes carvedilol and complains of fatigue, so she would rather try the olmesartan.  Will have her stop enalapril and start olmesartan 20 mg once daily.  She will need to repeat metabolic panel after 2 weeks.  We will see her back in the clinic in 6 weeks.    02/12/2020 PharmD CPP [facility] [address] [place] 02/29/2020 2:34 PM

## 2020-02-29 NOTE — Patient Instructions (Signed)
Return for a a follow up appointment March 30 at 10:30 am  Go to the lab in 2 weeks to repeat kidney function  Check your blood pressure at home daily and keep record of the readings.  Take your BP meds as follows:  Stop enalapril  Start olmesartan 20 mg once daily   Continue with all other medications  Bring all of your meds, your BP cuff and your record of home blood pressures to your next appointment.  Exercise as you're able, try to walk approximately 30 minutes per day.  Keep salt intake to a minimum, especially watch canned and prepared boxed foods.  Eat more fresh fruits and vegetables and fewer canned items.  Avoid eating in fast food restaurants.    HOW TO TAKE YOUR BLOOD PRESSURE: . Rest 5 minutes before taking your blood pressure. .  Don't smoke or drink caffeinated beverages for at least 30 minutes before. . Take your blood pressure before (not after) you eat. . Sit comfortably with your back supported and both feet on the floor (don't cross your legs). . Elevate your arm to heart level on a table or a desk. . Use the proper sized cuff. It should fit smoothly and snugly around your bare upper arm. There should be enough room to slip a fingertip under the cuff. The bottom edge of the cuff should be 1 inch above the crease of the elbow. . Ideally, take 3 measurements at one sitting and record the average.

## 2020-03-01 ENCOUNTER — Other Ambulatory Visit: Payer: Self-pay | Admitting: *Deleted

## 2020-03-01 DIAGNOSIS — E079 Disorder of thyroid, unspecified: Secondary | ICD-10-CM

## 2020-03-20 ENCOUNTER — Other Ambulatory Visit: Payer: Self-pay | Admitting: Family Medicine

## 2020-03-20 MED ORDER — AMPHETAMINE-DEXTROAMPHETAMINE 20 MG PO TABS: 20.0000 mg | ORAL_TABLET | Freq: Two times a day (BID) | ORAL | 0 refills | Status: DC

## 2020-03-20 NOTE — Telephone Encounter (Signed)
Requesting: Adderall 20mg   Contract: 05/27/2016 UDS: 12/01/2017 Last Visit: 12/12/2019 Next Visit: 06/18/2020 Last Refill: 01/20/2020 #60 and 0RF  Please Advise

## 2020-03-27 ENCOUNTER — Other Ambulatory Visit: Payer: Self-pay | Admitting: Pharmacist

## 2020-03-27 ENCOUNTER — Other Ambulatory Visit: Payer: Self-pay | Admitting: Cardiovascular Disease

## 2020-03-27 MED ORDER — ENALAPRIL MALEATE 5 MG PO TABS: 5.0000 mg | ORAL_TABLET | Freq: Every day | ORAL | 1 refills | Status: DC

## 2020-03-29 ENCOUNTER — Other Ambulatory Visit: Payer: Self-pay | Admitting: Family Medicine

## 2020-04-03 ENCOUNTER — Telehealth: Payer: Self-pay

## 2020-04-03 NOTE — Telephone Encounter (Signed)
Spoke w/pt regarding the need for labs prior to pharmacy appt and the pt voiced understanding

## 2020-04-10 LAB — BASIC METABOLIC PANEL
BUN/Creatinine Ratio: 18 (ref 9–23)
BUN: 13 mg/dL (ref 6–24)
CO2: 20 mmol/L (ref 20–29)
Calcium: 9.2 mg/dL (ref 8.7–10.2)
Chloride: 98 mmol/L (ref 96–106)
Creatinine, Ser: 0.74 mg/dL (ref 0.57–1.00)
Glucose: 90 mg/dL (ref 65–99)
Potassium: 3.9 mmol/L (ref 3.5–5.2)
Sodium: 135 mmol/L (ref 134–144)
eGFR: 102 mL/min/{1.73_m2} (ref >59)

## 2020-04-11 ENCOUNTER — Ambulatory Visit (INDEPENDENT_AMBULATORY_CARE_PROVIDER_SITE_OTHER): Payer: 59 | Admitting: Pharmacist Clinician (PhC)/ Clinical Pharmacy Specialist

## 2020-04-11 ENCOUNTER — Other Ambulatory Visit: Payer: Self-pay

## 2020-04-11 DIAGNOSIS — I10 Essential (primary) hypertension: Secondary | ICD-10-CM | POA: Diagnosis not present

## 2020-04-11 MED ORDER — ENALAPRIL MALEATE 5 MG PO TABS: 7.5000 mg | ORAL_TABLET | Freq: Every day | ORAL | 3 refills | Status: DC

## 2020-04-11 MED ORDER — EPLERENONE 25 MG PO TABS: 12.5000 mg | ORAL_TABLET | Freq: Every day | ORAL | 0 refills | Status: DC

## 2020-04-11 NOTE — Patient Instructions (Signed)
Return for a a follow up appointment May 11 at 10:30  Go to the lab 2 weeks after starting eplerenone  Check your blood pressure at home daily (if able) and keep record of the readings.  Take your BP meds as follows:  Increase enalapril to 7.5 mg (1.5 tablets) daily.  After 2 weeks start eplerenone 12.5 mg (1/2 tablet) once daily.  Bring all of your meds, your BP cuff and your record of home blood pressures to your next appointment.  Exercise as you're able, try to walk approximately 30 minutes per day.  Keep salt intake to a minimum, especially watch canned and prepared boxed foods.  Eat more fresh fruits and vegetables and fewer canned items.  Avoid eating in fast food restaurants.    HOW TO TAKE YOUR BLOOD PRESSURE: . Rest 5 minutes before taking your blood pressure. .  Don't smoke or drink caffeinated beverages for at least 30 minutes before. . Take your blood pressure before (not after) you eat. . Sit comfortably with your back supported and both feet on the floor (don't cross your legs). . Elevate your arm to heart level on a table or a desk. . Use the proper sized cuff. It should fit smoothly and snugly around your bare upper arm. There should be enough room to slip a fingertip under the cuff. The bottom edge of the cuff should be 1 inch above the crease of the elbow. . Ideally, take 3 measurements at one sitting and record the average.

## 2020-04-11 NOTE — Assessment & Plan Note (Signed)
Patient with systolic and diastolic hypertension, still not at goal.  She has been intolerant of multiple medications and leaves [place] with very limited choices.  Because of her bladder issues, would not recommend clonidine or an alpha blocker.  The only thing I have left to try would be eplerenone.  Will have her increase the enalapril to 7.5 mg once daily, then after 2 weeks start eplerenone 12.5 mg once daily.  Two weeks after starting that she will need to repeat metabolic panel.  We will see her in the office in 6 weeks for follow up

## 2020-04-11 NOTE — Progress Notes (Signed)
Patient ID: Kristen [name]                 DOB: December 13, 1975                      MRN: [id number]     HPI: Kristen [name] is a 45 y.o. female referred by Dr. Duke Salvia to HTN clinic. PMH includes hx of TIA, migraine, hypertension ,and hyperlipidemia. Kristen [name] has a very complicated health history and is intolerant to multiple BP medication (see list below). Will try to avoid thiazide and loop diuretics d/t previous hx of AKI with HCTZ, and ongoing bladder prolapse issues.   She has been seen several time in the hypertension clinic over the past 8 months and has been found to be intolerant of many medications.  At her last visit hydralazine was started, however this lasted only one dose and she was then started on doxazosin 2 mg qd.  She returns today for follow up.   After stopping hydralazine (1 dose) she reports that it took weeks to get her bladder function back to normal.  She never took the doxazosin that was then prescribed because the side effects list weight gain and fatigue (in UpToDate fatigue is 8-21%, weight gain < 1%).  She also stopped her spironolactone, as it was continuing to cause her leg cramps.  So she returns today, again only taking enalapril 5 mg.    Was rather straightforward with her today, we have very few options left to try.  Because she didn't have any side effects from losartan, out best bet would be to try a different ARB.  Also offered the option of carvedilol.  She notes that her mother takes carvedilol and has problems with fatigue, so given a choice, she would rather try the olmesartan.  She also is wondering about getting a renal artery ablation once this is available.    At her last visit she was agreeable to starting olmesartan 20 mg once daily.  Labs done earlier this week show SCr stable at 0.74 Took olmesartan x 3 weeks, caused problems so d/c, felt heavy, fatigued;  went back to enalapril 5  Had hip problems since last visit, just starting to walk again   Not on olmesartan  upon return.  Going to [employer] for renal denervation trial Convince that better with enalapril 5  States was ave 180/120  Current HTN meds:  enalapril 5 mg daily  Previously tried:  HCTZ - AKI, bladder issues, and kidney stones  Losartan - lack therapeutic response Nebivolol 5mg   - fatigue, weight gain, arm heaviness Enalapril - worked, but stopped working after few years Amlodipine - urine retention, edema, leg tighten, elevated HR and BP Hydralazine - urinary retention Spironolactone - muscle cramping Doxazosin - would not take due to side effects listed in PI (weight gain, fatigue)  BP goal: <130/80  Family History: patient's family history includes Alcohol abuse in her father and paternal grandfather; Dementia (age of onset: 67) in her maternal grandfather; Heart attack in her father and paternal grandfather; Heart attack (age of onset: 63) in her brother; Heart disease in her father, maternal grandmother, and paternal grandfather; Heart disease (age of onset: 56) in her brother; Heart failure in her maternal grandfather and maternal grandmother; Hyperlipidemia in her brother, father, mother, and paternal grandfather; Hypertension in her brother, father, and paternal grandfather; Osteoporosis in her mother; Stroke in her maternal grandfather and paternal grandfather; Valvular heart disease in her  maternal grandmother.  Social History: denies tobacco use, alcohol use or caffeine intake  Diet: lean meat, limited salt intake  Exercise:  walk 3-5 days per week 30-45 min, weights 2 days per week with cardio about 1 hour  Labs:  5/21:  Na 136, K 4.2, Glu 90, BUN 14, SCr 0.78  Home BP readings:   17 readings average 166/104, previous average was 167/111 (30 readings)  *HOME BP arm cuff - accurate with less than difference from manual BP  Wt Readings from Last 3 Encounters:  04/11/20 159 lb 9.6 oz (72.4 kg)  02/29/20 156 lb 12.8 oz (71.1 kg)  01/23/20 156 lb (70.8 kg)    BP Readings from Last 3 Encounters:  04/11/20 (!) 170/98  02/29/20 (!) 160/94  01/23/20 (!) 142/88   Pulse Readings from Last 3 Encounters:  04/11/20 95  02/29/20 100  01/23/20 98    Past Medical History:  Diagnosis Date  . Abnormal cervical cytology 09/26/2011   LGSIL in 2011, patient reports repeat pap was normal, has had intermittent abnormals with bx in past but always normalized, has had cryotherapy with good results Menarche at 17, irregular without birth control at times, very heavy LMP 08/26/2011 No gyn concerns, MGM last year normal   . ADD (attention deficit disorder)   . Allergy    seasonal  . Anemia    blood loss  . Asthma    mild, intermittent  . Bronchitis, acute 12/16/2010  . Chest pain, atypical 10/08/2010  . Chicken pox as a child  . Concussion 09/24/2010  . Contraceptive management 02/17/2011  . Decreased libido 09/26/2011  . Depression 2002   post partum  . Family history of heart disease   . Female bladder prolapse, acquired 09/24/2010  . Fibromyalgia   . Great toe pain, right 06/02/2017  . Headache 09/26/2013  . History of concussion 09/24/2010  . History of migraines 09/24/2010  . 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 HTN (hypertension) 09/24/2010  . Hyperlipidemia   . Hyperlipidemia   . Hypertension   . Hypertriglyceridemia 08/11/2019  . Interstitial cystitis 02/18/2011  . Left shoulder pain 05/27/2016  . Lymphadenopathy 09/24/2010  . Migraine 09/24/2010  . Migraine aura, persistent, with cerebral infarct, status over 72 hours 09/24/2010  . Myalgia 06/08/2018  . Neck pain, musculoskeletal 12/16/2010  . Overweight 05/29/2015  . Overweight(278.02) 12/26/2011  . Pharyngitis 05/27/2016  . Poor concentration 12/16/2010  . Preventative health care 11/28/2012  . Recurrent UTI   . Renal lithiasis 09/24/2010  . RUQ pain 05/27/2011  . Subacute and chronic vaginitis 09/24/2010  . Thyroid disease   . Tick bite 12/04/2013  . Transient left leg weakness 05/21/2014  . Vaginitis and  vulvovaginitis 05/21/2014    Current Outpatient Medications on File Prior to Visit  Medication Sig Dispense Refill  . acyclovir (ZOVIRAX) 200 MG capsule TAKE 1 CAPSULE BY MOUTH TWICE A DAY INS LIMIT OF 30 DAYS 60 capsule 11  . albuterol (VENTOLIN HFA) 108 (90 Base) MCG/ACT inhaler Inhale 2 puffs into the lungs every 4 (four) hours as needed for wheezing or shortness of breath. 18 g 2  . amphetamine-dextroamphetamine (ADDERALL) 20 MG tablet Take 1 tablet (20 mg total) by mouth 2 (two) times daily. March 2022 60 tablet 0  . EPINEPHrine 0.3 mg/0.3 mL IJ SOAJ injection Inject 0.3 mLs (0.3 mg total) into the muscle as needed for anaphylaxis. 2 each 2  . fluconazole (DIFLUCAN) 150 MG tablet Take 1 tablet (150 mg total) by mouth  once a week. 12 tablet 0  . HYDROcodone-acetaminophen (NORCO) 5-325 MG tablet Take 1 tablet by mouth every 6 (six) hours as needed for moderate pain or severe pain. 90 tablet 0  . lidocaine (XYLOCAINE) 2 % jelly APPLY TO AFFECTED AREA DAILY AS NEEDED 20 mL 2  . liothyronine (CYTOMEL) 5 MCG tablet Take 1 tablet (5 mcg total) by mouth daily. 90 tablet 1  . nitrofurantoin, macrocrystal-monohydrate, (MACROBID) 100 MG capsule TAKE 1 CAPSULE FOR 1 DOSE AS NEEDED FOR PREVENTION OF UTI *AFTER SELF CATH, SWIMMING, HOT TUB, SEX* 90 capsule 2  . Norethindrone Acetate-Ethinyl Estrad-FE (BLISOVI 24 FE) 1-20 MG-MCG(24) tablet Take 1 tablet by mouth daily. 28 tablet 11  . SYMBICORT 160-4.5 MCG/ACT inhaler TAKE 2 PUFFS BY MOUTH TWICE A DAY 10.2 Inhaler 5  . SYNTHROID 137 MCG tablet TAKE 1 TABLET BY MOUTH EVERY DAY 30 tablet 5  . SYRINGE-NEEDLE, DISP, 3 ML (LUER LOCK SAFETY SYRINGES) 25G X 1" 3 ML MISC Use as directed with b12 injections. (Patient not taking: Reported on 04/11/2020) 12 each 5   No current facility-administered medications on file prior to visit.    Allergies  Allergen Reactions  . Peanut-Containing Drug Products Anaphylaxis  . Clindamycin/Lincomycin Nausea And Vomiting    And  diarrhea.   . Influenza Vaccines Rash    Significant swelling in arm, rash, malaise, myalgias   . Hydralazine     Urinary retention and edema   . Terazol [Terconazole]     Burning sensation    Blood pressure (!) 170/98, pulse 95, resp. rate 16, height 5\' 2"  (1.575 m), weight 159 lb 9.6 oz (72.4 kg), SpO2 98 %.  HTN (hypertension) Patient with systolic and diastolic hypertension, still not at goal.  She has been intolerant of multiple medications and leaves with very limited choices.  Because of her bladder issues, would not recommend clonidine or an alpha blocker.  The only thing I have left to try would be eplerenone.  Will have her increase the enalapril to 7.5 mg once daily, then after 2 weeks start eplerenone 12.5 mg once daily.  Two weeks after starting that she will need to repeat metabolic panel.  We will see her in the office in 6 weeks for follow up  US PharmD CPP [facility] [address] [place] 04/11/2020 11:18 AM

## 2020-04-26 ENCOUNTER — Other Ambulatory Visit: Payer: Self-pay | Admitting: Family Medicine

## 2020-04-26 MED ORDER — AMPHETAMINE-DEXTROAMPHETAMINE 20 MG PO TABS: 20.0000 mg | ORAL_TABLET | Freq: Two times a day (BID) | ORAL | 0 refills | Status: DC

## 2020-04-26 NOTE — Telephone Encounter (Signed)
Requesting brand name only, states generic gives her headaches.

## 2020-05-12 ENCOUNTER — Other Ambulatory Visit: Payer: Self-pay | Admitting: Cardiovascular Disease

## 2020-05-13 ENCOUNTER — Other Ambulatory Visit: Payer: Self-pay | Admitting: Family Medicine

## 2020-05-17 ENCOUNTER — Encounter: Payer: Self-pay | Admitting: Family Medicine

## 2020-05-17 ENCOUNTER — Other Ambulatory Visit: Payer: Self-pay | Admitting: Family Medicine

## 2020-05-23 ENCOUNTER — Encounter: Payer: Self-pay | Admitting: *Deleted

## 2020-05-23 ENCOUNTER — Ambulatory Visit: Payer: 59

## 2020-06-02 ENCOUNTER — Other Ambulatory Visit: Payer: Self-pay | Admitting: Family Medicine

## 2020-06-04 MED ORDER — AMPHETAMINE-DEXTROAMPHETAMINE 20 MG PO TABS: 20.0000 mg | ORAL_TABLET | Freq: Two times a day (BID) | ORAL | 0 refills | Status: DC

## 2020-06-04 NOTE — Telephone Encounter (Signed)
Requesting: adderall Contract: 12/01/17 (WILL GET AT NEXT VISIT) UDS:12/01/17 (WILL GET AT NEXT VISIT) Last Visit: 12/12/19 Next Visit: 06/18/20 Last Refill: 04/26/20  Please Advise

## 2020-06-13 ENCOUNTER — Other Ambulatory Visit: Payer: Self-pay | Admitting: Family Medicine

## 2020-06-13 NOTE — Telephone Encounter (Signed)
Lvm to call if pt need medication sent in

## 2020-06-13 NOTE — Telephone Encounter (Signed)
Lvm to call if pt needed medication sent in

## 2020-06-18 ENCOUNTER — Encounter: Payer: Self-pay | Admitting: *Deleted

## 2020-06-18 ENCOUNTER — Ambulatory Visit (INDEPENDENT_AMBULATORY_CARE_PROVIDER_SITE_OTHER): Payer: 59 | Admitting: Family Medicine

## 2020-06-18 ENCOUNTER — Other Ambulatory Visit: Payer: Self-pay

## 2020-06-18 ENCOUNTER — Encounter: Payer: Self-pay | Admitting: Family Medicine

## 2020-06-18 VITALS — BP 120/70 | HR 81 | Temp 98.4°F | Resp 16 | Wt 155.6 lb

## 2020-06-18 DIAGNOSIS — I10 Essential (primary) hypertension: Secondary | ICD-10-CM

## 2020-06-18 DIAGNOSIS — Z79899 Other long term (current) drug therapy: Secondary | ICD-10-CM

## 2020-06-18 DIAGNOSIS — N3010 Interstitial cystitis (chronic) without hematuria: Secondary | ICD-10-CM

## 2020-06-18 DIAGNOSIS — E538 Deficiency of other specified B group vitamins: Secondary | ICD-10-CM | POA: Diagnosis not present

## 2020-06-18 DIAGNOSIS — E039 Hypothyroidism, unspecified: Secondary | ICD-10-CM | POA: Diagnosis not present

## 2020-06-18 DIAGNOSIS — F988 Other specified behavioral and emotional disorders with onset usually occurring in childhood and adolescence: Secondary | ICD-10-CM

## 2020-06-18 LAB — VITAMIN B12: Vitamin B-12: 180 pg/mL — ABNORMAL LOW (ref 211–911)

## 2020-06-18 LAB — TSH: TSH: 0.02 u[IU]/mL — ABNORMAL LOW (ref 0.35–4.50)

## 2020-06-18 MED ORDER — AMOXICILLIN 500 MG PO CAPS: 500.0000 mg | ORAL_CAPSULE | Freq: Three times a day (TID) | ORAL | 0 refills | Status: AC

## 2020-06-18 MED ORDER — NEOMYCIN-POLYMYXIN-DEXAMETH 3.5-10000-0.1 OP SUSP: 1.0000 [drp] | Freq: Four times a day (QID) | OPHTHALMIC | 1 refills | Status: DC

## 2020-06-18 NOTE — Patient Instructions (Signed)
Hypothyroidism  Hypothyroidism is when the thyroid gland does not make enough of certain hormones (it is underactive). The thyroid gland is a small gland located in the lower front part of the neck, just in front of the windpipe (trachea). This gland makes hormones that help control how the body uses food for energy (metabolism) as well as how the heart and brain function. These hormones also play a role in keeping your bones strong. When the thyroid is underactive, it produces too little of the hormones thyroxine (T4) and triiodothyronine (T3). What are the causes? This condition may be caused by:  Hashimoto's disease. This is a disease in which the body's disease-fighting system (immune system) attacks the thyroid gland. This is the most common cause.  Viral infections.  Pregnancy.  Certain medicines.  Birth defects.  Past radiation treatments to the head or neck for cancer.  Past treatment with radioactive iodine.  Past exposure to radiation in the environment.  Past surgical removal of part or all of the thyroid.  Problems with a gland in the center of the brain (pituitary gland).  Lack of enough iodine in the diet. What increases the risk? You are more likely to develop this condition if:  You are female.  You have a family history of thyroid conditions.  You use a medicine called lithium.  You take medicines that affect the immune system (immunosuppressants). What are the signs or symptoms? Symptoms of this condition include:  Feeling as though you have no energy (lethargy).  Not being able to tolerate cold.  Weight gain that is not explained by a change in diet or exercise habits.  Lack of appetite.  Dry skin.  Coarse hair.  Menstrual irregularity.  Slowing of thought processes.  Constipation.  Sadness or depression. How is this diagnosed? This condition may be diagnosed based on:  Your symptoms, your medical history, and a physical exam.  Blood  tests. You may also have imaging tests, such as an ultrasound or MRI. How is this treated? This condition is treated with medicine that replaces the thyroid hormones that your body does not make. After you begin treatment, it may take several weeks for symptoms to go away. Follow these instructions at home:  Take over-the-counter and prescription medicines only as told by your health care provider.  If you start taking any new medicines, tell your health care provider.  Keep all follow-up visits as told by your health care provider. This is important. ? As your condition improves, your dosage of thyroid hormone medicine may change. ? You will need to have blood tests regularly so that your health care provider can monitor your condition. Contact a health care provider if:  Your symptoms do not get better with treatment.  You are taking thyroid hormone replacement medicine and you: ? Sweat a lot. ? Have tremors. ? Feel anxious. ? Lose weight rapidly. ? Cannot tolerate heat. ? Have emotional swings. ? Have diarrhea. ? Feel weak. Get help right away if you have:  Chest pain.  An irregular heartbeat.  A rapid heartbeat.  Difficulty breathing. Summary  Hypothyroidism is when the thyroid gland does not make enough of certain hormones (it is underactive).  When the thyroid is underactive, it produces too little of the hormones thyroxine (T4) and triiodothyronine (T3).  The most common cause is Hashimoto's disease, a disease in which the body's disease-fighting system (immune system) attacks the thyroid gland. The condition can also be caused by viral infections, medicine, pregnancy, or   past radiation treatment to the head or neck.  Symptoms may include weight gain, dry skin, constipation, feeling as though you do not have energy, and not being able to tolerate cold.  This condition is treated with medicine to replace the thyroid hormones that your body does not make. This  information is not intended to replace advice given to you by your health care provider. Make sure you discuss any questions you have with your health care provider. Document Revised: 09/30/2019 Document Reviewed: 09/15/2019 Elsevier Patient Education  2021 [employer]  

## 2020-06-20 ENCOUNTER — Other Ambulatory Visit (INDEPENDENT_AMBULATORY_CARE_PROVIDER_SITE_OTHER): Payer: 59

## 2020-06-20 DIAGNOSIS — R946 Abnormal results of thyroid function studies: Secondary | ICD-10-CM | POA: Diagnosis not present

## 2020-06-20 LAB — T3, FREE: T3, Free: 3.2 pg/mL (ref 2.3–4.2)

## 2020-06-20 LAB — T4, FREE: Free T4: 1.09 ng/dL (ref 0.60–1.60)

## 2020-06-21 ENCOUNTER — Encounter: Payer: Self-pay | Admitting: Family Medicine

## 2020-06-21 LAB — DRUG MONITORING, PANEL 8 WITH CONFIRMATION, URINE
6 Acetylmorphine: NEGATIVE ng/mL (ref <10)
Alcohol Metabolites: NEGATIVE ng/mL
Amphetamine: 2204 ng/mL — ABNORMAL HIGH (ref <250)
Amphetamines: POSITIVE ng/mL — AB (ref <500)
Benzodiazepines: NEGATIVE ng/mL (ref <100)
Buprenorphine, Urine: NEGATIVE ng/mL (ref <5)
Cocaine Metabolite: NEGATIVE ng/mL (ref <150)
Creatinine: 170.5 mg/dL
MDMA: NEGATIVE ng/mL (ref <500)
Marijuana Metabolite: NEGATIVE ng/mL (ref <20)
Methamphetamine: NEGATIVE ng/mL (ref <250)
Opiates: NEGATIVE ng/mL (ref <100)
Oxidant: NEGATIVE ug/mL
Oxycodone: NEGATIVE ng/mL (ref <100)
pH: 7.8 (ref 4.5–9.0)

## 2020-06-21 LAB — DM TEMPLATE

## 2020-06-21 MED ORDER — SYNTHROID 137 MCG PO TABS: 137.0000 ug | ORAL_TABLET | Freq: Every day | ORAL | 1 refills | Status: DC

## 2020-06-23 NOTE — Progress Notes (Signed)
Subjective:    Patient ID: Kristen [name], female    DOB: 04/18/1975, 45 y.o.   MRN: [id number]  Chief Complaint  Patient presents with   Follow-up    Pt has no concerns or problems.    HPI Patient is in today for follow up on chronic medical conditions including thyroid disease, hypertension and vitamin D deficiency. No recent febrile illness or hospitalizations. She is following with cardiology to get her high blood pressure under control and it has been difficult. She feels better since she started cytomel, her headaches, skin, hair, and nails are better. Denies CP/palp/SOB/HA/congestion/fevers/GI or GU c/o. Taking meds as prescribed   Past Medical History:  Diagnosis Date   Abnormal cervical cytology 09/26/2011   LGSIL in 2011, patient reports repeat pap was normal, has had intermittent abnormals with bx in past but always normalized, has had cryotherapy with good results Menarche at 34, irregular without birth control at times, very heavy LMP 08/26/2011 No gyn concerns, MGM last year normal    ADD (attention deficit disorder)    Allergy    seasonal   Anemia    blood loss   Asthma    mild, intermittent   Bronchitis, acute 12/16/2010   Chest pain, atypical 10/08/2010   Chicken pox as a child   Concussion 09/24/2010   Contraceptive management 02/17/2011   Decreased libido 09/26/2011   Depression 2002   post partum   Family history of heart disease    Female bladder prolapse, acquired 09/24/2010   Fibromyalgia    Great toe pain, right 06/02/2017   Headache 09/26/2013   History of concussion 09/24/2010   History of migraines 09/24/2010   HSV-2 infection 09/24/2010   HTN (hypertension) 09/24/2010   Hyperlipidemia    Hyperlipidemia    Hypertension    Hypertriglyceridemia 08/11/2019   Interstitial cystitis 02/18/2011   Left shoulder pain 05/27/2016   Lymphadenopathy 09/24/2010   Migraine 09/24/2010   Migraine aura, persistent, with cerebral infarct, status over 72 hours 09/24/2010   Myalgia  06/08/2018   Neck pain, musculoskeletal 12/16/2010   Overweight 05/29/2015   Overweight(278.02) 12/26/2011   Pharyngitis 05/27/2016   Poor concentration 12/16/2010   Preventative health care 11/28/2012   Recurrent UTI    Renal lithiasis 09/24/2010   RUQ pain 05/27/2011   Subacute and chronic vaginitis 09/24/2010   Thyroid disease    Tick bite 12/04/2013   Transient left leg weakness 05/21/2014   Vaginitis and vulvovaginitis 05/21/2014    Past Surgical History:  Procedure Laterality Date   INCONTINENCE SURGERY  2003    Family History  Problem Relation Age of Onset   Heart attack Father    Hyperlipidemia Father    Hypertension Father    Alcohol abuse Father    Heart disease Father    Heart attack Brother 37   Hypertension Brother    Heart disease Brother 26       MI   Hyperlipidemia Brother    Dementia Maternal Grandfather 92       early stages   Stroke Maternal Grandfather    Heart failure Maternal Grandfather    Heart attack Paternal Grandfather    Stroke Paternal Grandfather    Heart disease Paternal Grandfather    Alcohol abuse Paternal Grandfather    Hyperlipidemia Paternal Grandfather    Hypertension Paternal Grandfather    Osteoporosis Mother    Hyperlipidemia Mother    Heart disease Maternal Grandmother    Heart failure Maternal Grandmother  Valvular heart disease Maternal Grandmother     Social History   Socioeconomic History   Marital status: Married    Spouse name: Not on file   Number of children: Not on file   Years of education: Not on file   Highest education level: Not on file  Occupational History   Not on file  Tobacco Use   Smoking status: Never   Smokeless tobacco: Never  Vaping Use   Vaping Use: Never used  Substance and Sexual Activity   Alcohol use: No    Alcohol/week: 0.0 standard drinks    Comment: 0   Drug use: No   Sexual activity: Yes    Partners: Male    Birth control/protection: Pill  Other Topics Concern   Not on file   Social History Narrative   Lives with husband 2 children in a one story home.     Works as a Geophysicist/field seismologist.     Education: college.   Social Determinants of Health   Financial Resource Strain: Not on file  Food Insecurity: Not on file  Transportation Needs: Not on file  Physical Activity: Not on file  Stress: Not on file  Social Connections: Not on file  Intimate Partner Violence: Not on file    Outpatient Medications Prior to Visit  Medication Sig Dispense Refill   acyclovir (ZOVIRAX) 200 MG capsule TAKE 1 CAPSULE BY MOUTH TWICE A DAY INS LIMIT OF 30 DAYS 60 capsule 11   albuterol (VENTOLIN HFA) 108 (90 Base) MCG/ACT inhaler Inhale 2 puffs into the lungs every 4 (four) hours as needed for wheezing or shortness of breath. 18 g 2   amphetamine-dextroamphetamine (ADDERALL) 20 MG tablet Take 1 tablet (20 mg total) by mouth 2 (two) times daily. March 2022 60 tablet 0   enalapril (VASOTEC) 5 MG tablet Take 1.5 tablets (7.5 mg total) by mouth daily. 135 tablet 3   EPINEPHrine 0.3 mg/0.3 mL IJ SOAJ injection Inject 0.3 mLs (0.3 mg total) into the muscle as needed for anaphylaxis. 2 each 2   eplerenone (INSPRA) 25 MG tablet TAKE 1/2 TABLET BY MOUTH EVERY DAY 45 tablet 3   fluconazole (DIFLUCAN) 150 MG tablet Take 1 tablet (150 mg total) by mouth once a week. 12 tablet 0   HYDROcodone-acetaminophen (NORCO) 5-325 MG tablet Take 1 tablet by mouth every 6 (six) hours as needed for moderate pain or severe pain. 90 tablet 0   lidocaine (XYLOCAINE) 2 % jelly APPLY TO AFFECTED AREA DAILY AS NEEDED 20 mL 2   liothyronine (CYTOMEL) 5 MCG tablet Take 1 tablet (5 mcg total) by mouth daily. 90 tablet 1   nitrofurantoin, macrocrystal-monohydrate, (MACROBID) 100 MG capsule TAKE 1 CAPSULE FOR 1 DOSE AS NEEDED FOR PREVENTION OF UTI *AFTER SELF CATH, SWIMMING, HOT TUB, SEX* 90 capsule 2   Norethindrone Acetate-Ethinyl Estrad-FE (BLISOVI 24 FE) 1-20 MG-MCG(24) tablet Take 1 tablet by mouth daily. 28 tablet  11   SYMBICORT 160-4.5 MCG/ACT inhaler TAKE 2 PUFFS BY MOUTH TWICE A DAY 10.2 Inhaler 5   SYRINGE-NEEDLE, DISP, 3 ML (LUER LOCK SAFETY SYRINGES) 25G X 1" 3 ML MISC Use as directed with b12 injections. (Patient not taking: Reported on 04/11/2020) 12 each 5   SYNTHROID 137 MCG tablet Take 1 tablet (137 mcg total) by mouth daily. 90 tablet 1   No facility-administered medications prior to visit.    Allergies  Allergen Reactions   Peanut-Containing Drug Products Anaphylaxis   Clindamycin/Lincomycin Nausea And Vomiting    And  diarrhea.    Influenza Vaccines Rash    Significant swelling in arm, rash, malaise, myalgias    Hydralazine     Urinary retention and edema    Terazol [Terconazole]     Burning sensation    Review of Systems  Constitutional:  Negative for fever and malaise/fatigue.  HENT:  Negative for congestion.   Eyes:  Negative for blurred vision.  Respiratory:  Negative for shortness of breath.   Cardiovascular:  Negative for chest pain, palpitations and leg swelling.  Gastrointestinal:  Negative for abdominal pain, blood in stool and nausea.  Genitourinary:  Negative for dysuria and frequency.  Musculoskeletal:  Negative for falls.  Skin:  Negative for rash.  Neurological:  Negative for dizziness, loss of consciousness and headaches.  Endo/Heme/Allergies:  Negative for environmental allergies.  Psychiatric/Behavioral:  Negative for depression. The patient is not nervous/anxious.       Objective:    Physical Exam Constitutional:      General: She is not in acute distress.    Appearance: She is well-developed.  HENT:     Head: Normocephalic and atraumatic.  Eyes:     Conjunctiva/sclera: Conjunctivae normal.  Neck:     Thyroid: No thyromegaly.  Cardiovascular:     Rate and Rhythm: Normal rate and regular rhythm.     Heart sounds: Normal heart sounds. No murmur heard. Pulmonary:     Effort: Pulmonary effort is normal. No respiratory distress.     Breath sounds:  Normal breath sounds.  Abdominal:     General: Bowel sounds are normal. There is no distension.     Palpations: Abdomen is soft. There is no mass.     Tenderness: There is no abdominal tenderness.  Musculoskeletal:     Cervical back: Neck supple.  Lymphadenopathy:     Cervical: No cervical adenopathy.  Skin:    General: Skin is warm and dry.  Neurological:     Mental Status: She is alert and oriented to person, place, and time.  Psychiatric:        Behavior: Behavior normal.    BP 120/70   Pulse 81   Temp 98.4 F (36.9 C)   Resp 16   Wt 155 lb 9.6 oz (70.6 kg)   SpO2 99%   BMI 28.46 kg/m  Wt Readings from Last 3 Encounters:  06/18/20 155 lb 9.6 oz (70.6 kg)  04/11/20 159 lb 9.6 oz (72.4 kg)  02/29/20 156 lb 12.8 oz (71.1 kg)    Diabetic Foot Exam - Simple   No data filed    Lab Results  Component Value Date   WBC 9.5 12/12/2019   HGB 14.3 12/12/2019   HCT 42.4 12/12/2019   PLT 205.0 12/12/2019   GLUCOSE 90 04/09/2020   CHOL 253 (H) 12/12/2019   TRIG (H) 12/12/2019    528.0 Triglyceride is over 400; calculations on Lipids are invalid.   HDL 37.00 (L) 12/12/2019   LDLDIRECT 128.0 12/12/2019   LDLCALC 136 (H) 11/22/2012   ALT 11 12/12/2019   AST 13 12/12/2019   NA 135 04/09/2020   K 3.9 04/09/2020   CL 98 04/09/2020   CREATININE 0.74 04/09/2020   BUN 13 04/09/2020   CO2 20 04/09/2020   TSH 0.02 (L) 06/18/2020   INR 1.09 05/23/2015    Lab Results  Component Value Date   TSH 0.02 (L) 06/18/2020   Lab Results  Component Value Date   WBC 9.5 12/12/2019   HGB 14.3 12/12/2019   HCT  42.4 12/12/2019   MCV 91.4 12/12/2019   PLT 205.0 12/12/2019   Lab Results  Component Value Date   NA 135 04/09/2020   K 3.9 04/09/2020   CO2 20 04/09/2020   GLUCOSE 90 04/09/2020   BUN 13 04/09/2020   CREATININE 0.74 04/09/2020   BILITOT 0.4 12/12/2019   ALKPHOS 38 (L) 12/12/2019   AST 13 12/12/2019   ALT 11 12/12/2019   PROT 6.5 12/12/2019   ALBUMIN 3.9  12/12/2019   CALCIUM 9.2 04/09/2020   ANIONGAP 11 05/23/2015   EGFR 102 04/09/2020   GFR 95.60 12/12/2019   Lab Results  Component Value Date   CHOL 253 (H) 12/12/2019   Lab Results  Component Value Date   HDL 37.00 (L) 12/12/2019   Lab Results  Component Value Date   LDLCALC 136 (H) 11/22/2012   Lab Results  Component Value Date   TRIG (H) 12/12/2019    528.0 Triglyceride is over 400; calculations on Lipids are invalid.   Lab Results  Component Value Date   CHOLHDL 7 12/12/2019   No results found for: HGBA1C     Assessment & Plan:   Problem List Items Addressed This Visit     HTN (hypertension) - Primary    Following closely with hypertension clinic at cardiology but numbers remain hi       Relevant Orders   TSH (Completed)   Interstitial cystitis   Relevant Orders   DRUG MONITORING, PANEL 8 WITH CONFIRMATION, URINE (Completed)   Hypothyroid    She reports feeling better since medicaiton was altered. No changes. She notes her skin, energy and bowel habits are improved       Relevant Orders   TSH (Completed)   Vitamin B12 deficiency   Relevant Orders   Vitamin B12 (Completed)   Other Visit Diagnoses     High risk medication use       Relevant Orders   DRUG MONITORING, PANEL 8 WITH CONFIRMATION, URINE (Completed)   Attention deficit disorder, unspecified hyperactivity presence       Relevant Orders   DRUG MONITORING, PANEL 8 WITH CONFIRMATION, URINE (Completed)       I am having [place] start on neomycin-polymyxin b-dexamethasone and amoxicillin. I am also having her maintain her Norethindrone Acetate-Ethinyl Estrad-FE, Luer Lock Safety Syringes, Symbicort, albuterol, EPINEPHrine, nitrofurantoin (macrocrystal-monohydrate), lidocaine, HYDROcodone-acetaminophen, fluconazole, liothyronine, enalapril, eplerenone, amphetamine-dextroamphetamine, and acyclovir.  Meds ordered this encounter  Medications   neomycin-polymyxin b-dexamethasone (MAXITROL)  3.5-10000-0.1 SUSP    Sig: Place 1 drop into both eyes in the morning, at noon, in the evening, and at bedtime.    Dispense:  5 mL    Refill:  1   amoxicillin (AMOXIL) 500 MG capsule    Sig: Take 1 capsule (500 mg total) by mouth 3 (three) times daily for 10 days.    Dispense:  30 capsule    Refill:  0     Penni Homans, MD

## 2020-06-23 NOTE — Assessment & Plan Note (Addendum)
She reports feeling better since medicaiton was altered. No changes. She notes her skin, energy and bowel habits are improved

## 2020-06-23 NOTE — Assessment & Plan Note (Signed)
Following closely with hypertension clinic at cardiology but numbers remain hi

## 2020-07-27 ENCOUNTER — Other Ambulatory Visit: Payer: Self-pay | Admitting: Family Medicine

## 2020-07-27 ENCOUNTER — Encounter: Payer: Self-pay | Admitting: Family Medicine

## 2020-07-27 MED ORDER — AMPHETAMINE-DEXTROAMPHETAMINE 20 MG PO TABS: 20.0000 mg | ORAL_TABLET | Freq: Two times a day (BID) | ORAL | 0 refills | Status: DC

## 2020-07-27 NOTE — Telephone Encounter (Signed)
Requesting: Adderall 20mg  Contract: 06/18/2020 UDS: 06/18/2020 Last Visit: 06/18/2020 Next Visit: 01/28/2021 Last Refill: 06/04/2020 #60 and 0RF  Please Advise

## 2020-07-30 ENCOUNTER — Other Ambulatory Visit: Payer: Self-pay | Admitting: Family Medicine

## 2020-07-30 MED ORDER — BLISOVI 24 FE 1-20 MG-MCG(24) PO TABS: 1.0000 | ORAL_TABLET | Freq: Every day | ORAL | 11 refills | Status: DC

## 2020-07-30 NOTE — Telephone Encounter (Signed)
Is it okay to refill medication , patient last refill was back in 2020?

## 2020-08-09 ENCOUNTER — Encounter: Payer: Self-pay | Admitting: Family Medicine

## 2020-08-10 ENCOUNTER — Other Ambulatory Visit: Payer: Self-pay | Admitting: Family Medicine

## 2020-08-10 MED ORDER — AMPHETAMINE-DEXTROAMPHETAMINE 20 MG PO TABS: 20.0000 mg | ORAL_TABLET | Freq: Two times a day (BID) | ORAL | 0 refills | Status: DC

## 2020-08-24 ENCOUNTER — Telehealth (HOSPITAL_BASED_OUTPATIENT_CLINIC_OR_DEPARTMENT_OTHER): Payer: Self-pay | Admitting: Cardiovascular Disease

## 2020-08-24 NOTE — Telephone Encounter (Signed)
Patient called today to schedule her cpap titration from her 09/06/19 sleep study. I reached out to [place] since dr Tresa Endo read her sleep study and Burna Mortimer advised that she make an appointment with dr Duke Salvia to document she is still having symptoms and then she can move forward with getting an APAP machine. Patient agreed with treatment.

## 2020-08-24 NOTE — Telephone Encounter (Signed)
Patient calling in to say that she needs another sleep study done but for her insurance she needs for Dr. Duke Salvia to say that she still has symptoms. Was able to schedule her for appt on 8/16 at 840.please advise

## 2020-08-28 ENCOUNTER — Ambulatory Visit (INDEPENDENT_AMBULATORY_CARE_PROVIDER_SITE_OTHER): Payer: 59 | Admitting: Cardiovascular Disease

## 2020-08-28 ENCOUNTER — Telehealth: Payer: Self-pay | Admitting: *Deleted

## 2020-08-28 ENCOUNTER — Other Ambulatory Visit: Payer: Self-pay

## 2020-08-28 ENCOUNTER — Encounter (HOSPITAL_BASED_OUTPATIENT_CLINIC_OR_DEPARTMENT_OTHER): Payer: Self-pay | Admitting: Cardiovascular Disease

## 2020-08-28 DIAGNOSIS — I10 Essential (primary) hypertension: Secondary | ICD-10-CM

## 2020-08-28 DIAGNOSIS — E781 Pure hyperglyceridemia: Secondary | ICD-10-CM | POA: Diagnosis not present

## 2020-08-28 DIAGNOSIS — G4733 Obstructive sleep apnea (adult) (pediatric): Secondary | ICD-10-CM | POA: Diagnosis not present

## 2020-08-28 HISTORY — DX: Obstructive sleep apnea (adult) (pediatric): G47.33

## 2020-08-28 MED ORDER — CARVEDILOL 3.125 MG PO TABS: 3.1250 mg | ORAL_TABLET | Freq: Two times a day (BID) | ORAL | 3 refills | Status: DC

## 2020-08-28 NOTE — Assessment & Plan Note (Signed)
Patient was noted to have OSA on her home sleep study.  She had multiple injuries and was unable to complete her CPAP titration.  She continues to have daytime somnolence and fatigue upon awakening.  She sleeps alone and is unsure if she snores.  She will follow-up and get the CPAP with the hopes that this will also help her hypertension.

## 2020-08-28 NOTE — Patient Instructions (Signed)
Medication Instructions:  START CARVEDILOL 3.125 MG TWICE A DAY   Labwork: NONE  Testing/Procedures: NONE  Follow-Up: 10/24/2020 AT 10:15 AM [facility]  If you need a refill on your cardiac medications before your next appointment, please call your pharmacy.

## 2020-08-28 NOTE — Telephone Encounter (Signed)
Order for APAP sent to [facility].

## 2020-08-28 NOTE — Telephone Encounter (Signed)
Patient seen in office 8/16

## 2020-08-28 NOTE — Assessment & Plan Note (Signed)
Triglycerides have always been very elevated.  Minimal response to fish oil and krill oil in the past.  She is not interested in statins. Calcium score was 0.    Consider fenofibrate or Vascepa.  However given that we are still working to control her blood pressure we will not add any other medications at this time.  She has been working very hard on diet and is starting back with exercise.

## 2020-08-28 NOTE — Progress Notes (Signed)
[facility]pertension [facility] Follow up Assessment:    Date:  08/28/2020   ID:  Kristen [name], DOB August 04, 1975, MRN [id number]  PCP:  Bradd Canary, MD  Cardiologist:  None  Nephrologist:  Referring MD: Bradd Canary, MD   CC: Hypertension  History of Present Illness:    Kristen [name] is a 45 y.o. female with a hx of hypertension and hyperlipidemia here for follow-up. She initially established care in the hypertension clinic 08/11/2019. She struggled with low BP for years.  Then her BP spiked 09/2013.  She had a TIA and saw Dr. Allyson Sabal.  She continued to have intermittent spiking of her BP.  She struggled with several medications and finally went on Bystolic.  Her BP was well-controlled but she gained weight and had heaviness in her L arm.  She stopped it and felt much better. Her BP started to be higher after a stomach bug and tick bite.  She was treated with enalapril and the dose was increased.  Initially it worked well but stopped being as effective.  She had to start back on Bystolic but is gaining weight, fatigued, and has heaviness in her chest.  Her blood pressure is controlled but the symptoms are manageable.  She has not had any orthopnea or PND but does sometimes have edema that she attributes to enalapril.  Overall her diet has been good. Her mom had heart disease at a young age so she is used to eating a healthy diet.  Lean meat and limited salt intake.  She cooks at home.  She doesn't drink caffeine or EtOH.  She takes magnesium, B12 injections, vitamin ADEK.  She has been taking Adderall since 8th grade.  She stopped taking it for a few days and her BP was still high.  She has a history of hypertriglyceridemia since she was in middle school.  She has taken fish oil without much change in her numbers.  She is also taking Krill oil.  What helped most was drinking warm water with lemon.  She is averse to trying statins because many family members have had muscle aches.  Of note, her father had a  heart attack in his 43s.  Her brother had 2 heart attacks in his 58s.  Her mother has heart rhythm disorders.  At her initial appointment she was started on amlodipine. She stopped enalapril and Bystolic. Since then she saw our pharmacist and increased spironolactone 12/2019.  She was referred to the prep program through the [facility]. Her blood pressure was still elevated so she was started on hydralazine. She took one dose and it caused bladder issues, so she was switched to doxasozin. She also stopped spironolactone due to leg cramps. She saw our pharmacist on 02/2020 and switched from enalapril to olmesartan. She would not start carvedilol because it caused fatigue for her mother. She stopped olmesartan before follow-up with our pharmacist in March. She was referred to [facility] for a renal denervation trial. Enalapril was increased and eplerenone was added.   Today, she reports a few recent injuries since her last visit. Her son had emergency surgery, she had Pneumonia, injured the labrum in her hip, and tore ligaments near her left ankle (currently recovering from the latter). Thus she has not been able to complete her sleep study. She is still hoping that a CPAP will help her hypertension. Overall, she reports her blood pressure has worsened in the last 6-7 months. At home, her blood pressure has been "  crazy high sometimes, normal sometimes, and low sometimes." Her averages have been: high 185/125, normal 135-140/85, low >100/52. Overall, she believes the average is 155/110. When her blood pressure is low, she feels very fatigued. By contrast she feels better when it is very high. Of note, she no longer has the tension in her head that she previously felt with high blood pressures. Before, she believed her high readings correlated with working and stress, but lately this hasn't been the case as her injuries kept her out of work. She did note that her blood pressure was not high during a 5-day trip to the  beach. For her diet, she is participating in a lean and green diet with her daughter. They eat lean meats and are avoiding pastas and carbs. She also tries to drink the same amount of water every day. Lately, her weight and energy are normalizing, and she is concerned this will change with side effects from new medications. Currently, she is taking 5 mg Enalapril twice a day. Typically she wakes up to use the restroom several times a night, and does not feel rested in the morning. However, she does not fall asleep easily during the day. Also, she does not know if she snores. At her last sleep study, she was told she stopped breathing 19 times. She was unable to participate in the renal denervation study as it had ended, but it may start again in November 2022. She denies any palpitations, chest pain, or shortness of breath. No lightheadedness, headaches, syncope, orthopnea, or PND. Also has no lower extremity edema or exertional symptoms.  Previous antihypertensives: HCTZ - AKI, bladder issues, and kidney stones Losartan-didn't work Bystolic- fatigue, arm heaviness Enalapril- swelling at higher dose, fatigue. Amlodipine - urine retention, edema, leg tighten, elevated HR and BP Hydralazine - urinary retention Spironolactone - muscle cramping Doxazosin - would not take due to side effects listed in PI (weight gain, fatigue)    Past Medical History:  Diagnosis Date   Abnormal cervical cytology 09/26/2011   LGSIL in 2011, patient reports repeat pap was normal, has had intermittent abnormals with bx in past but always normalized, has had cryotherapy with good results Menarche at 17, irregular without birth control at times, very heavy LMP 08/26/2011 No gyn concerns, MGM last year normal    ADD (attention deficit disorder)    Allergy    seasonal   Anemia    blood loss   Asthma    mild, intermittent   Bronchitis, acute 12/16/2010   Chest pain, atypical 10/08/2010   Chicken pox as a child   Concussion  09/24/2010   Contraceptive management 02/17/2011   Decreased libido 09/26/2011   Depression 2002   post partum   Family history of heart disease    Female bladder prolapse, acquired 09/24/2010   Fibromyalgia    Great toe pain, right 06/02/2017   Headache 09/26/2013   History of concussion 09/24/2010   History of migraines 09/24/2010   HSV-2 infection 09/24/2010   HTN (hypertension) 09/24/2010   Hyperlipidemia    Hyperlipidemia    Hypertension    Hypertriglyceridemia 08/11/2019   Interstitial cystitis 02/18/2011   Left shoulder pain 05/27/2016   Lymphadenopathy 09/24/2010   Migraine 09/24/2010   Migraine aura, persistent, with cerebral infarct, status over 72 hours 09/24/2010   Myalgia 06/08/2018   Neck pain, musculoskeletal 12/16/2010   OSA (obstructive sleep apnea) 08/28/2020   Overweight 05/29/2015   Overweight(278.02) 12/26/2011   Pharyngitis 05/27/2016   Poor concentration 12/16/2010  Preventative health care 11/28/2012   Recurrent UTI    Renal lithiasis 09/24/2010   RUQ pain 05/27/2011   Subacute and chronic vaginitis 09/24/2010   Thyroid disease    Tick bite 12/04/2013   Transient left leg weakness 05/21/2014   Vaginitis and vulvovaginitis 05/21/2014    Past Surgical History:  Procedure Laterality Date   INCONTINENCE SURGERY  2003    Current Medications: Current Meds  Medication Sig   acyclovir (ZOVIRAX) 200 MG capsule TAKE 1 CAPSULE BY MOUTH TWICE A DAY INS LIMIT OF 30 DAYS   albuterol (VENTOLIN HFA) 108 (90 Base) MCG/ACT inhaler Inhale 2 puffs into the lungs every 4 (four) hours as needed for wheezing or shortness of breath.   amphetamine-dextroamphetamine (ADDERALL) 20 MG tablet Take 1 tablet (20 mg total) by mouth 2 (two) times daily. July 2022   carvedilol (COREG) 3.125 MG tablet Take 1 tablet (3.125 mg total) by mouth 2 (two) times daily.   enalapril (VASOTEC) 5 MG tablet Take 5 mg by mouth 2 (two) times daily.   EPINEPHrine 0.3 mg/0.3 mL IJ SOAJ injection Inject 0.3 mLs (0.3 mg  total) into the muscle as needed for anaphylaxis.   eplerenone (INSPRA) 25 MG tablet TAKE 1/2 TABLET BY MOUTH EVERY DAY   fluconazole (DIFLUCAN) 150 MG tablet Take 150 mg by mouth once a week.   HYDROcodone-acetaminophen (NORCO) 5-325 MG tablet Take 1 tablet by mouth every 6 (six) hours as needed for moderate pain or severe pain.   lidocaine (XYLOCAINE) 2 % jelly APPLY TO AFFECTED AREA DAILY AS NEEDED   liothyronine (CYTOMEL) 5 MCG tablet Take 1 tablet (5 mcg total) by mouth daily.   neomycin-polymyxin b-dexamethasone (MAXITROL) 3.5-10000-0.1 SUSP Place 1 drop into both eyes in the morning, at noon, in the evening, and at bedtime.   nitrofurantoin, macrocrystal-monohydrate, (MACROBID) 100 MG capsule TAKE 1 CAPSULE FOR 1 DOSE AS NEEDED FOR PREVENTION OF UTI *AFTER SELF CATH, SWIMMING, HOT TUB, SEX*   Norethindrone Acetate-Ethinyl Estrad-FE (BLISOVI 24 FE) 1-20 MG-MCG(24) tablet Take 1 tablet by mouth daily.   SYMBICORT 160-4.5 MCG/ACT inhaler TAKE 2 PUFFS BY MOUTH TWICE A DAY   SYNTHROID 137 MCG tablet Take 1 tablet (137 mcg total) by mouth daily.     Allergies:   Peanut-containing drug products, Clindamycin/lincomycin, Influenza vaccines, Hydralazine, and Terazol [terconazole]   Social History   Socioeconomic History   Marital status: Married    Spouse name: Not on file   Number of children: Not on file   Years of education: Not on file   Highest education level: Not on file  Occupational History   Not on file  Tobacco Use   Smoking status: Never   Smokeless tobacco: Never  Vaping Use   Vaping Use: Never used  Substance and Sexual Activity   Alcohol use: No    Alcohol/week: 0.0 standard drinks    Comment: 0   Drug use: No   Sexual activity: Yes    Partners: Male    Birth control/protection: Pill  Other Topics Concern   Not on file  Social History Narrative   Lives with husband 2 children in a one story home.     Works as a Teacher, adult education.     Education: college.    Social Determinants of Health   Financial Resource Strain: Not on file  Food Insecurity: Not on file  Transportation Needs: Not on file  Physical Activity: Not on file  Stress: Not on file  Social Connections: Not on  file     Family History: The patient's family history includes Alcohol abuse in her father and paternal grandfather; Dementia (age of onset: 3) in her maternal grandfather; Heart attack in her father and paternal grandfather; Heart attack (age of onset: 49) in her brother; Heart disease in her father, maternal grandmother, and paternal grandfather; Heart disease (age of onset: 64) in her brother; Heart failure in her maternal grandfather and maternal grandmother; Hyperlipidemia in her brother, father, mother, and paternal grandfather; Hypertension in her brother, father, and paternal grandfather; Osteoporosis in her mother; Stroke in her maternal grandfather and paternal grandfather; Valvular heart disease in her maternal grandmother.  ROS:   Please see the history of present illness.    (+) Fatigue (+) Stress All other systems reviewed and are negative.  EKGs/Labs/Other Studies Reviewed:    Coronary Calcium Score 08/30/2019: FINDINGS: Non-cardiac: See separate report from [facility].   Ascending Aorta:   Pericardium: Normal   Coronary arteries: No coronary calcification.   IMPRESSION: Coronary calcium score of 0. This was 0 percentile for age and sex matched control.  EKG:   08/28/2020: Sinus rhythm. Rate 96 bpm. 01/23/2020: EKG was not ordered. 08/11/2019: sinus rhythm.  Rate 88 bpm.  Recent Labs: 12/12/2019: ALT 11; Hemoglobin 14.3; Platelets 205.0 04/09/2020: BUN 13; Creatinine, Ser 0.74; Potassium 3.9; Sodium 135 06/18/2020: TSH 0.02   Recent Lipid Panel    Component Value Date/Time   CHOL 253 (H) 12/12/2019 1054   TRIG (H) 12/12/2019 1054    528.0 Triglyceride is over 400; calculations on Lipids are invalid.   HDL 37.00 (L) 12/12/2019 1054    CHOLHDL 7 12/12/2019 1054   VLDL 49.6 (H) 12/07/2018 1108   LDLCALC 136 (H) 11/22/2012 1416   LDLDIRECT 128.0 12/12/2019 1054    Physical Exam:    VS:  BP (!) 186/120 (BP Location: Right Arm, Patient Position: Sitting)   Pulse 96   Ht  (1.575 m)   Wt 153 lb (69.4 kg)   BMI 27.98 kg/m  , BMI Body mass index is 27.98 kg/m. GENERAL:  Well appearing HEENT: Pupils equal round and reactive, fundi not visualized, oral mucosa unremarkable NECK:  No jugular venous distention, waveform within normal limits, carotid upstroke brisk and symmetric, no bruits LUNGS:  Clear to auscultation bilaterally HEART:  RRR.  PMI not displaced or sustained,S1 and S2 within normal limits, no S3, no S4, no clicks, no rubs, no murmurs ABD:  Flat, positive bowel sounds normal in frequency in pitch, no bruits, no rebound, no guarding, no midline pulsatile mass, no hepatomegaly, no splenomegaly EXT:  2 plus pulses throughout, no edema, no cyanosis no clubbing.  L ankle brace SKIN:  No rashes no nodules NEURO:  Cranial nerves II through XII grossly intact, motor grossly intact throughout PSYCH:  Cognitively intact, oriented to person place and time   ASSESSMENT:    1. Primary hypertension   2. OSA (obstructive sleep apnea)   3. Hypertriglyceridemia      PLAN:   HTN (hypertension) Blood pressure remains very poorly controlled and labile.  She has been intolerant of many medications.  She notes that well-controlled her blood pressure best with Bystolic but she could not tolerate it.  She has been hesitant to try other beta-blockers but is willing to try carvedilol 3.125 mg twice daily.  She will continue the enalapril.  We also discussed stress management and getting back into the swing of exercise.  We have also referred her for her CPAP titration  trial.  OSA (obstructive sleep apnea) Patient was noted to have OSA on her home sleep study.  She had multiple injuries and was unable to complete her CPAP  titration.  She continues to have daytime somnolence and fatigue upon awakening.  She sleeps alone and is unsure if she snores.  She will follow-up and get the CPAP with the hopes that this will also help her hypertension.  Hypertriglyceridemia Triglycerides have always been very elevated.  Minimal response to fish oil and krill oil in the past.  She is not interested in statins. Calcium score was 0.    Consider fenofibrate or Vascepa.  However given that we are still working to control her blood pressure we will not add any other medications at this time.  She has been working very hard on diet and is starting back with exercise.  Secondary Causes of Hypertension  Medications/Herbal: OCP, steroids, stimulants, antidepressants, weight loss medication, immune suppressants, NSAIDs, sympathomimetics, alcohol, caffeine, licorice, ginseng, St. John's wort, chemo  Sleep Apnea: will order sleep study for snoring and daytime somnolence Renal artery stenosis:-renal artery Dopplers negative 11/2013 Hyperaldosteronism- (testing not indicated) Hyper/hypothyroidism (thyroid- subclinical hyperthryoid 05/2019.  Managed by Dr. Rogelia RohrerBlythe Pheochromocytoma: (testing not indicated) Cushing's syndrome: (testing not indicated) Coarctation of the aorta (BP symmetric)  Disposition:    FU with MD/PharmD in 2 months.   Medication Adjustments/Labs and Tests Ordered: Current medicines are reviewed at length with the patient today.  Concerns regarding medicines are outlined above.  Orders Placed This Encounter  Procedures   EKG 12-Lead    Meds ordered this encounter  Medications   carvedilol (COREG) 3.125 MG tablet    Sig: Take 1 tablet (3.125 mg total) by mouth 2 (two) times daily.    Dispense:  180 tablet    Refill:  3    I,Mathew Stumpf,acting as a scribe for Chilton Siiffany [place], MD.,have documented all relevant documentation on the behalf of Chilton Siiffany [place], MD,as directed by  Chilton Siiffany [place], MD while in the  presence of Chilton Siiffany [place], MD.  I, Jodiann Ognibene C. Duke Salviaandolph, MD have reviewed all documentation for this visit.  The documentation of the exam, diagnosis, procedures, and orders on 08/28/2020 are all accurate and complete.   Signed, Chilton Siiffany [place], MD  08/28/2020 9:41 AM    [facility]

## 2020-08-28 NOTE — Telephone Encounter (Signed)
-----   Message from Burnell Blanks, LPN sent at 04/05/5571 11:30 AM EDT ----- Hello all  Patient was seen by Dr Duke Salvia today and her office note should be updated for her CPAP   Thanks  Juliette Alcide

## 2020-08-28 NOTE — Assessment & Plan Note (Signed)
Blood pressure remains very poorly controlled and labile.  She has been intolerant of many medications.  She notes that well-controlled her blood pressure best with Bystolic but she could not tolerate it.  She has been hesitant to try other beta-blockers but is willing to try carvedilol 3.125 mg twice daily.  She will continue the enalapril.  We also discussed stress management and getting back into the swing of exercise.  We have also referred her for her CPAP titration trial.

## 2020-08-29 NOTE — Addendum Note (Signed)
Addended by: Regis Bill B on: 08/29/2020 05:51 PM   Modules accepted: Orders

## 2020-09-27 ENCOUNTER — Other Ambulatory Visit: Payer: Self-pay | Admitting: Family Medicine

## 2020-09-27 LAB — HM MAMMOGRAPHY

## 2020-09-27 MED ORDER — AMPHETAMINE-DEXTROAMPHETAMINE 20 MG PO TABS: 20.0000 mg | ORAL_TABLET | Freq: Two times a day (BID) | ORAL | 0 refills | Status: DC

## 2020-09-27 NOTE — Telephone Encounter (Signed)
Requesting: adderall Contract: 06/18/20 UDS:06/18/20 Last Visit: 06/18/20 Next Visit: 01/18/21 Last Refill: 08/10/20  Please Advise

## 2020-10-01 ENCOUNTER — Encounter: Payer: Self-pay | Admitting: *Deleted

## 2020-10-21 ENCOUNTER — Other Ambulatory Visit: Payer: Self-pay | Admitting: Family Medicine

## 2020-10-21 ENCOUNTER — Other Ambulatory Visit: Payer: Self-pay | Admitting: Cardiovascular Disease

## 2020-10-22 NOTE — Telephone Encounter (Signed)
Rx(s) sent to pharmacy electronically.  

## 2020-10-23 ENCOUNTER — Encounter (HOSPITAL_BASED_OUTPATIENT_CLINIC_OR_DEPARTMENT_OTHER): Payer: Self-pay

## 2020-10-24 ENCOUNTER — Ambulatory Visit (HOSPITAL_BASED_OUTPATIENT_CLINIC_OR_DEPARTMENT_OTHER): Payer: 59 | Admitting: Cardiovascular Disease

## 2020-11-02 ENCOUNTER — Other Ambulatory Visit: Payer: Self-pay

## 2020-11-02 ENCOUNTER — Encounter: Payer: Self-pay | Admitting: Obstetrics and Gynecology

## 2020-11-02 ENCOUNTER — Ambulatory Visit (INDEPENDENT_AMBULATORY_CARE_PROVIDER_SITE_OTHER): Payer: 59 | Admitting: Obstetrics and Gynecology

## 2020-11-02 VITALS — BP 162/115 | HR 89 | Ht 62.0 in | Wt 153.0 lb

## 2020-11-02 DIAGNOSIS — N816 Rectocele: Secondary | ICD-10-CM

## 2020-11-02 DIAGNOSIS — N8110 Cystocele, unspecified: Secondary | ICD-10-CM | POA: Diagnosis not present

## 2020-11-02 DIAGNOSIS — M62838 Other muscle spasm: Secondary | ICD-10-CM

## 2020-11-02 DIAGNOSIS — N812 Incomplete uterovaginal prolapse: Secondary | ICD-10-CM | POA: Diagnosis not present

## 2020-11-02 DIAGNOSIS — R3129 Other microscopic hematuria: Secondary | ICD-10-CM

## 2020-11-02 DIAGNOSIS — R350 Frequency of micturition: Secondary | ICD-10-CM

## 2020-11-02 LAB — POCT URINALYSIS DIPSTICK
Appearance: NORMAL
Bilirubin, UA: NEGATIVE
Glucose, UA: NEGATIVE
Ketones, UA: NEGATIVE
Leukocytes, UA: NEGATIVE
Nitrite, UA: NEGATIVE
Protein, UA: NEGATIVE
Spec Grav, UA: 1.025 (ref 1.010–1.025)
Urobilinogen, UA: 0.2 E.U./dL
pH, UA: 7 (ref 5.0–8.0)

## 2020-11-02 NOTE — Progress Notes (Signed)
[facility] New Patient Evaluation and Consultation  Referring Provider: Shea Evans, MD PCP: Bradd Canary, MD Date of Service: 11/02/2020  SUBJECTIVE Chief Complaint: New Patient (Initial Visit)- prolapse  History of Present Illness: Kristen [name] is a 45 y.o. White or Caucasian female seen in consultation at the request of Dr. Juliene Pina for evaluation of prolapse.    Review of records from Dr Juliene Pina significant for: Has history of A/P repair with sling in 2002. Now has to splint to have a bowel movement, rectocele has recurred.   Urinary Symptoms: Does not leak urine.   Day time voids: "constant" at least every hour.  Nocturia: 2-4 times per night to void. Voiding dysfunction: she does not empty her bladder well.  Currently does not use a catheter to empty bladder but had to previously for many years- for the first 4 years after the sling. Sometimes if she holds her urine too long, she still has to catheterize (about twice a year).  When urinating, she feels a weak stream, difficulty starting urine stream, dribbling after finishing, the need to urinate multiple times in a row, and to push on her belly or vagina to empty bladder. Drinks: water only, occasional hot tea Medications tried: oxybutynin, vesicare, and one other medication.   UTIs:  0  UTI's in the last year- takes macrobid if she goes in pool for prophylaxis, taking boric acid suppositories for chronic yeast and BV Reports history of blood in urine and kidney or bladder stones. Has not had workup for hematuria for many years.   Pelvic Organ Prolapse Symptoms:                  She Admits to a feeling of a bulge the vaginal area. It has been present for 3 months.  She Admits to seeing a bulge previously but not currently.  This bulge is bothersome. Attended PT and had some temporary improvement. After a fall in June, prolapse worsened.   Bowel Symptom: Bowel movements: 1 time(s) per day Stool consistency:  varies Straining: yes.  Splinting: yes.  Incomplete evacuation: yes.  She Denies accidental bowel leakage / fecal incontinence Bowel regimen: none  Sexual Function Sexually active: yes.  Sexual orientation: Straight Pain with sex: Yes, has discomfort due to prolapse  Pelvic Pain Admits to pelvic pain Location: frontal to left, sacral, rectal Pain occurs: constant Prior pain treatment: PT Improved by: laying with feet up Worsened by: standing, sitting, holding urine Has Interstitial Cystitis- was on elmiron for many years. Not taking any medications for bladder pain.  Has done hydrodistention and bladder instillation.   Past Medical History:  Past Medical History:  Diagnosis Date   Abnormal cervical cytology 09/26/2011   LGSIL in 2011, patient reports repeat pap was normal, has had intermittent abnormals with bx in past but always normalized, has had cryotherapy with good results Menarche at 17, irregular without birth control at times, very heavy LMP 08/26/2011 No gyn concerns, MGM last year normal    ADD (attention deficit disorder)    Allergy    seasonal   Anemia    blood loss   Asthma    mild, intermittent   Bronchitis, acute 12/16/2010   Chest pain, atypical 10/08/2010   Chicken pox as a child   Concussion 09/24/2010   Contraceptive management 02/17/2011   Decreased libido 09/26/2011   Depression 2002   post partum   Family history of heart disease    Female bladder prolapse, acquired 09/24/2010  Fibromyalgia    Great toe pain, right 06/02/2017   Headache 09/26/2013   History of concussion 09/24/2010   History of migraines 09/24/2010   HSV-2 infection 09/24/2010   HTN (hypertension) 09/24/2010   Hyperlipidemia    Hyperlipidemia    Hypertension    Hypertriglyceridemia 08/11/2019   Interstitial cystitis 02/18/2011   Left shoulder pain 05/27/2016   Lymphadenopathy 09/24/2010   Migraine 09/24/2010   Migraine aura, persistent, with cerebral infarct, status over 72 hours  09/24/2010   Myalgia 06/08/2018   Neck pain, musculoskeletal 12/16/2010   OSA (obstructive sleep apnea) 08/28/2020   Overweight 05/29/2015   Overweight(278.02) 12/26/2011   Pharyngitis 05/27/2016   Poor concentration 12/16/2010   Preventative health care 11/28/2012   Recurrent UTI    Renal lithiasis 09/24/2010   RUQ pain 05/27/2011   Subacute and chronic vaginitis 09/24/2010   Thyroid disease    Tick bite 12/04/2013   Transient left leg weakness 05/21/2014   Vaginitis and vulvovaginitis 05/21/2014     Past Surgical History:   Past Surgical History:  Procedure Laterality Date   ANTERIOR AND POSTERIOR REPAIR     INCONTINENCE SURGERY  01/13/2001     Past OB/GYN History: OB History  Gravida Para Term Preterm AB Living  4       2    SAB IAB Ectopic Multiple Live Births  2       2    # Outcome Date GA Lbr Len/2nd Weight Sex Delivery Anes PTL Lv  4 Gravida           3 Gravida           2 SAB           1 SAB             Vaginal deliveries: 2,  Forceps/ Vacuum deliveries: 0, Cesarean section: 0 Menopausal: No Contraception: OCPs- takes continuously. Any history of abnormal pap smears: yes, last pap 2021- neg   Medications: She has a current medication list which includes the following prescription(s): acyclovir, albuterol, amphetamine-dextroamphetamine, carvedilol, enalapril, epinephrine, eplerenone, hydrocodone-acetaminophen, lidocaine, liothyronine, neomycin-polymyxin b-dexamethasone, nitrofurantoin (macrocrystal-monohydrate), blisovi 24 fe, symbicort, synthroid, and fluconazole.   Allergies: Patient is allergic to peanut-containing drug products, clindamycin/lincomycin, influenza vaccines, hydralazine, and terazol [terconazole].   Social History:  Social History   Tobacco Use   Smoking status: Never   Smokeless tobacco: Never  Vaping Use   Vaping Use: Never used  Substance Use Topics   Alcohol use: No    Alcohol/week: 0.0 standard drinks    Comment: 0   Drug use: No     Relationship status: married She lives with husband and children.   She is employed as a Teacher, adult education. Regular exercise: Yes: walking daily History of abuse: No  Family History:   Family History  Problem Relation Age of Onset   Osteoporosis Mother    Hyperlipidemia Mother    Kidney disease Father    Heart attack Father    Hyperlipidemia Father    Hypertension Father    Alcohol abuse Father    Heart disease Father    Heart attack Brother 47   Hypertension Brother    Heart disease Brother 44       MI   Hyperlipidemia Brother    Heart disease Maternal Grandmother    Heart failure Maternal Grandmother    Valvular heart disease Maternal Grandmother    Dementia Maternal Grandfather 70       early stages   Stroke  Maternal Grandfather    Heart failure Maternal Grandfather    Heart attack Paternal Grandfather    Stroke Paternal Grandfather    Heart disease Paternal Grandfather    Alcohol abuse Paternal Grandfather    Hyperlipidemia Paternal Grandfather    Hypertension Paternal Grandfather      Review of Systems: Review of Systems  Constitutional:  Negative for fever, malaise/fatigue and weight loss.  Respiratory:  Negative for cough, shortness of breath and wheezing.   Cardiovascular:  Negative for chest pain, palpitations and leg swelling.  Gastrointestinal:  Negative for abdominal pain and blood in stool.  Genitourinary:  Positive for dysuria.  Musculoskeletal:  Negative for myalgias.  Skin:  Negative for rash.  Neurological:  Negative for dizziness and headaches.  Endo/Heme/Allergies:  Does not bruise/bleed easily.  Psychiatric/Behavioral:  Negative for depression. The patient is not nervous/anxious.     OBJECTIVE Physical Exam: Vitals:   11/02/20 0908  BP: (!) 162/115  Pulse: 89  Weight: 153 lb (69.4 kg)  Height: 5\' 2"  (1.575 m)    Physical Exam Constitutional:      General: She is not in acute distress. Pulmonary:     Effort: Pulmonary effort is  normal.  Abdominal:     General: There is no distension.     Palpations: Abdomen is soft.     Tenderness: There is no abdominal tenderness. There is no rebound.  Musculoskeletal:        General: No swelling. Normal range of motion.  Skin:    General: Skin is warm and dry.     Findings: No rash.  Neurological:     Mental Status: She is alert and oriented to person, place, and time.  Psychiatric:        Mood and Affect: Mood normal.        Behavior: Behavior normal.     GU / Detailed Urogynecologic Evaluation:  Pelvic Exam: Normal external female genitalia; Bartholin's and Skene's glands normal in appearance; urethral meatus normal in appearance, no urethral masses or discharge.   CST: negative   Speculum exam reveals normal vaginal mucosa without atrophy. Cervix normal appearance. Uterus normal single, nontender. Adnexa no mass, fullness, tenderness.    Pelvic floor strength III/V  Pelvic floor musculature: Right levator tender, Right obturator non-tender, Left levator non-tender, Left obturator non-tender  POP-Q:   POP-Q  -1                                            Aa   -1                                           Ba  -1                                              C   4                                            Gh  5  Pb  7                                            tvl   -1                                            Ap  -1                                            Bp  -5.5                                              D     Rectal Exam:  Normal external rectum  Post-Void Residual (PVR) by Bladder Scan: In order to evaluate bladder emptying, we discussed obtaining a postvoid residual and she agreed to this procedure.  Procedure: The ultrasound unit was placed on the patient's abdomen in the suprapubic region after the patient had voided. A PVR of 33 ml was obtained by bladder scan.  Laboratory Results: POC  urine: small blood  ASSESSMENT AND PLAN Ms. Tennyson is a 46 y.o. with:  1. Uterovaginal prolapse, incomplete   2. Prolapse of anterior vaginal wall   3. Prolapse of posterior vaginal wall   4. Urinary frequency   5. Levator spasm   6. Microscopic hematuria    1.Stage II anterior, Stage II posterior, Stage II apical prolapse - For treatment of pelvic organ prolapse, we discussed options for management including expectant management, conservative management, and surgical management, such as Kegels, a pessary, pelvic floor physical therapy, and specific surgical procedures. - She is interested in surgery. We discussed two options for prolapse repair:  1) vaginal repair without mesh - Pros - safer, no mesh complications - Cons - not as strong as mesh repair, higher risk of recurrence  2) laparoscopic repair with mesh - Pros - stronger, better long-term success - Cons - risks of mesh implant (erosion into vagina or bladder, adhering to the rectum, pain) - these risks are lower than with a vaginal mesh but still exist - She is interested in the surgery with the best success rate since she has had prior prolapse repair. Therefore will proceed with Robotic TLH, BS, Sacrocolpopexy and cystoscopy - We discussed that urine leakage can occur after surgery and that normally would recommend urodynamic testing. However with her history of sling and obstructive voiding symptoms, would defer any anti-incontinence surgery.   2. Urinary frequency - has failed several medications.  - For refractory OAB we reviewed the procedure for intravesical Botox injection with cystoscopy in the office.We discussed that there is a 5-15% chance of needing to catheterize with Botox and that this usually resolves in a few months; however can persist for longer periods of time.  Typically Botox injections would need to be repeated every 3-12 months since this is not a permanent therapy.   We discussed the role of sacral  neuromodulation and how it works. It requires a test phase, and documentation of bladder function via diary.  After a successful test period, a permanent wire and generator are placed in the OR.   We also discussed the role of percutaneous tibial nerve stimulation and how it works.  She understands it requires 12 weekly visits for temporary neuromodulation of the sacral nerve roots via the tibial nerve and that she may then require continued tapered treatment.   - She would like to pursue prolapse surgery first then will reassess need for intervention for her overactive bladder  3. Levator spasm - Has had improvement with pelvic PT in the past, very mild on exam today - we reviewed that this will not improve, and could worsen, with prolapse surgery. At this time, not bothersome.   4. Hematuria - POC urine with small blood- will send for micro UA and culture for confirmation of hematuria. We discussed need for possible workup  Marguerita Beards, MD   Medical Decision Making:  - Reviewed/ ordered a clinical laboratory test - Review and summation of prior records

## 2020-11-03 LAB — URINALYSIS, MICROSCOPIC ONLY
Bacteria, UA: NONE SEEN
Casts: NONE SEEN /lpf
RBC, Urine: NONE SEEN /hpf (ref 0–2)
WBC, UA: NONE SEEN /hpf (ref 0–5)

## 2020-11-05 ENCOUNTER — Other Ambulatory Visit: Payer: Self-pay | Admitting: Family Medicine

## 2020-11-05 MED ORDER — AMPHETAMINE-DEXTROAMPHETAMINE 20 MG PO TABS: 20.0000 mg | ORAL_TABLET | Freq: Two times a day (BID) | ORAL | 0 refills | Status: DC

## 2020-11-05 NOTE — Telephone Encounter (Signed)
Requesting: Adderall 20mg   Contract: 06/18/2020 UDS: 06/18/2020 Last Visit: 06/18/2020 Next Visit: 01/28/21  Last Refill: 09/27/2020 #60 and 0RF  Please Advise

## 2020-11-06 ENCOUNTER — Ambulatory Visit (HOSPITAL_BASED_OUTPATIENT_CLINIC_OR_DEPARTMENT_OTHER): Payer: 59 | Admitting: Cardiovascular Disease

## 2020-11-10 LAB — URINE CULTURE

## 2020-11-12 ENCOUNTER — Other Ambulatory Visit: Payer: Self-pay | Admitting: Family Medicine

## 2020-11-13 ENCOUNTER — Other Ambulatory Visit: Payer: Self-pay | Admitting: Family Medicine

## 2020-11-15 HISTORY — PX: TONSILLECTOMY: SUR1361

## 2020-11-20 ENCOUNTER — Encounter: Payer: Self-pay | Admitting: Cardiovascular Disease

## 2020-11-20 ENCOUNTER — Ambulatory Visit (INDEPENDENT_AMBULATORY_CARE_PROVIDER_SITE_OTHER): Payer: 59 | Admitting: Cardiovascular Disease

## 2020-11-20 ENCOUNTER — Other Ambulatory Visit: Payer: Self-pay

## 2020-11-20 VITALS — BP 148/104 | HR 96 | Ht 62.0 in | Wt 159.0 lb

## 2020-11-20 DIAGNOSIS — I10 Essential (primary) hypertension: Secondary | ICD-10-CM

## 2020-11-20 DIAGNOSIS — E782 Mixed hyperlipidemia: Secondary | ICD-10-CM

## 2020-11-20 DIAGNOSIS — G4733 Obstructive sleep apnea (adult) (pediatric): Secondary | ICD-10-CM

## 2020-11-20 DIAGNOSIS — R0683 Snoring: Secondary | ICD-10-CM | POA: Diagnosis not present

## 2020-11-20 MED ORDER — ENALAPRIL MALEATE 10 MG PO TABS: ORAL_TABLET | ORAL | 3 refills | Status: DC

## 2020-11-20 NOTE — Patient Instructions (Signed)
Medication Instructions:  Start enalapril 10 mg in the morning (one tablet). Take enalapril 5 mg (one-half tablet) in the evening.  Stop taking carvedilol.  *If you need a refill on your cardiac medications before your next appointment, please call your pharmacy*   Follow-Up: At [facility], you and your health needs are our priority.  As part of our continuing mission to provide you with exceptional heart care, we have created designated Provider Care Teams.  These Care Teams include your primary Cardiologist (physician) and Advanced Practice Providers (APPs -  Physician Assistants and Nurse Practitioners) who all work together to provide you with the care you need, when you need it.  We recommend signing up for the patient portal called "MyChart".  Sign up information is provided on this After Visit Summary.  MyChart is used to connect with patients for Virtual Visits (Telemedicine).  Patients are able to view lab/test results, encounter notes, upcoming appointments, etc.  Non-urgent messages can be sent to your provider as well.   To learn more about what you can do with MyChart, go to [email or web address].    Your next appointment:   12 month(s)  The format for your next appointment:   In Person  Provider:   Dr. Bishop Limbo, MD   Other Instructions Keep appointment with dr. Duke Salvia as scheduled.

## 2020-11-28 MED ORDER — ENALAPRIL MALEATE 10 MG PO TABS: ORAL_TABLET | ORAL | 3 refills | Status: DC

## 2020-12-04 ENCOUNTER — Encounter: Payer: 59 | Admitting: Obstetrics and Gynecology

## 2020-12-08 ENCOUNTER — Encounter: Payer: Self-pay | Admitting: Cardiovascular Disease

## 2020-12-08 NOTE — Progress Notes (Signed)
Cardiology Office Note    Date:  12/08/2020   ID:  DIONNE KNOOP, DOB Aug 17, 1975, MRN [id number]  PCP:  Mosie Lukes, MD  Cardiologist:  Shelva Majestic, MD (sleep); Dr. Skeet Latch  New sleep evaluation  History of Present Illness:  Sary KALAH PFLUM is a 45 y.o. female who is followed by Dr. Skeet Latch for her cardiology care and Dr. Marlou Starks for primary care.  She has history of hypertension and hyperlipidemia.  She also has a history of ADHD for which she was on Calderol in the past.  Most recently, she has been on a medical regimen of carvedilol 3.125 mg but she states she is no longer taking this.  She is on enalapril 5 mg twice a day, and prolonged 12.5 mg daily.  She admits to an allergy to Bystolic.  She is on levothyroxine for hypothyroidism, and albuterol and Symbicort for lung disease.  Due to concern for obstructive sleep apnea with symptoms of snoring, fatigue, and difficult to control hypertension she was referred for a home sleep study which was done on September 07, 2019.  She was found to have mild overall obstructive sleep apnea with an AHI of 8.9.  However, the severity during REM sleep could not be assessed on her home study.  She had moderate oxygen desaturation to a nadir of 83% and snored for 11.5% of her sleep time.  She initiated CPAP auto therapy with initial pressure range of 6 to 16 cm of water.  Set up date was September 04, 2021 with [facility] as her DME company.  A download was obtained from October 9 through November 19, 2020.  She is meeting compliance standards with only missing 1 day of therapy.  Her 95th percentile pressure was 13.3 with a maximum average pressure of 14.5.  AHI was excellent at 1.4 events per hour.  She has been using a ResMed AirFit N 30i S/M wide mask.  Typically she goes to bed at midnight and wakes up at 8 AM.  However on her download average use was 5 hours and 31 minutes.  She has noticed significant improvement since initiating CPAP  therapy.  Previously she was experiencing nocturia every 1-2 hours per night up to 4-5 times per night.  She had snoring.  Her sleep is nonrestorative.  Since initiating CPAP therapy her sleep is now restorative and she is waking up feeling refreshed and has had dreaming.  She presents for initial sleep evaluation.  Past Medical History:  Diagnosis Date   Abnormal cervical cytology 09/26/2011   LGSIL in 2011, patient reports repeat pap was normal, has had intermittent abnormals with bx in past but always normalized, has had cryotherapy with good results Menarche at 89, irregular without birth control at times, very heavy LMP 08/26/2011 No gyn concerns, MGM last year normal    ADD (attention deficit disorder)    Allergy    seasonal   Anemia    blood loss   Asthma    mild, intermittent   Bronchitis, acute 12/16/2010   Chest pain, atypical 10/08/2010   Chicken pox as a child   Concussion 09/24/2010   Contraceptive management 02/17/2011   Decreased libido 09/26/2011   Depression 2002   post partum   Family history of heart disease    Female bladder prolapse, acquired 09/24/2010   Fibromyalgia    Great toe pain, right 06/02/2017   Headache 09/26/2013   History of concussion 09/24/2010   History of migraines  09/24/2010   HSV-2 infection 09/24/2010   HTN (hypertension) 09/24/2010   Hyperlipidemia    Hyperlipidemia    Hypertension    Hypertriglyceridemia 08/11/2019   Interstitial cystitis 02/18/2011   Left shoulder pain 05/27/2016   Lymphadenopathy 09/24/2010   Migraine 09/24/2010   Migraine aura, persistent, with cerebral infarct, status over 72 hours 09/24/2010   Myalgia 06/08/2018   Neck pain, musculoskeletal 12/16/2010   OSA (obstructive sleep apnea) 08/28/2020   Overweight 05/29/2015   Overweight(278.02) 12/26/2011   Pharyngitis 05/27/2016   Poor concentration 12/16/2010   Preventative health care 11/28/2012   Recurrent UTI    Renal lithiasis 09/24/2010   RUQ pain 05/27/2011   Subacute and chronic  vaginitis 09/24/2010   Thyroid disease    Tick bite 12/04/2013   Transient left leg weakness 05/21/2014   Vaginitis and vulvovaginitis 05/21/2014    Past Surgical History:  Procedure Laterality Date   ANTERIOR AND POSTERIOR REPAIR     INCONTINENCE SURGERY  01/13/2001    Current Medications: Outpatient Medications Prior to Visit  Medication Sig Dispense Refill   acyclovir (ZOVIRAX) 200 MG capsule TAKE 1 CAPSULE BY MOUTH TWICE A DAY INS LIMIT OF 30 DAYS 60 capsule 11   albuterol (VENTOLIN HFA) 108 (90 Base) MCG/ACT inhaler INHALE 2 PUFFS INTO THE LUNGS EVERY 4 HOURS AS NEEDED FOR WHEEZE OR FOR SHORTNESS OF BREATH 18 each 2   amphetamine-dextroamphetamine (ADDERALL) 20 MG tablet Take 1 tablet (20 mg total) by mouth 2 (two) times daily. 60 tablet 0   EPINEPHrine 0.3 mg/0.3 mL IJ SOAJ injection Inject 0.3 mLs (0.3 mg total) into the muscle as needed for anaphylaxis. 2 each 2   eplerenone (INSPRA) 25 MG tablet TAKE 1/2 TABLET BY MOUTH EVERY DAY 45 tablet 3   fluconazole (DIFLUCAN) 150 MG tablet Take 150 mg by mouth once a week.     HYDROcodone-acetaminophen (NORCO) 5-325 MG tablet Take 1 tablet by mouth every 6 (six) hours as needed for moderate pain or severe pain. 90 tablet 0   lidocaine (XYLOCAINE) 2 % jelly APPLY TO AFFECTED AREA DAILY AS NEEDED 20 mL 2   liothyronine (CYTOMEL) 5 MCG tablet TAKE 1 TABLET BY MOUTH EVERY DAY 90 tablet 1   neomycin-polymyxin b-dexamethasone (MAXITROL) 3.5-10000-0.1 SUSP Place 1 drop into both eyes in the morning, at noon, in the evening, and at bedtime. 5 mL 1   nitrofurantoin, macrocrystal-monohydrate, (MACROBID) 100 MG capsule TAKE 1 CAPSULE FOR 1 DOSE AS NEEDED FOR PREVENTION OF UTI *AFTER SELF CATH, SWIMMING, HOT TUB, SEX* 30 capsule 8   Norethindrone Acetate-Ethinyl Estrad-FE (BLISOVI 24 FE) 1-20 MG-MCG(24) tablet Take 1 tablet by mouth daily. 28 tablet 11   SYMBICORT 160-4.5 MCG/ACT inhaler TAKE 2 PUFFS BY MOUTH TWICE A DAY 10.2 Inhaler 5   SYNTHROID 137 MCG  tablet TAKE 1 TABLET BY MOUTH EVERY DAY 90 tablet 1   carvedilol (COREG) 3.125 MG tablet Take 1 tablet (3.125 mg total) by mouth 2 (two) times daily. 180 tablet 3   enalapril (VASOTEC) 5 MG tablet TAKE 1 TABLET BY MOUTH EVERY DAY IN THE EVENING 90 tablet 2   No facility-administered medications prior to visit.     Allergies:   Peanut-containing drug products, Clindamycin/lincomycin, Influenza vaccines, Hydralazine, and Terazol [terconazole]   Social History   Socioeconomic History   Marital status: Married    Spouse name: Not on file   Number of children: Not on file   Years of education: Not on file   Highest education level:  Not on file  Occupational History   Not on file  Tobacco Use   Smoking status: Never   Smokeless tobacco: Never  Vaping Use   Vaping Use: Never used  Substance and Sexual Activity   Alcohol use: No    Alcohol/week: 0.0 standard drinks    Comment: 0   Drug use: No   Sexual activity: Yes    Partners: Male    Birth control/protection: Pill  Other Topics Concern   Not on file  Social History Narrative   Lives with husband 2 children in a one story home.     Works as a Geophysicist/field seismologist.     Education: college.   Social Determinants of Health   Financial Resource Strain: Not on file  Food Insecurity: Not on file  Transportation Needs: Not on file  Physical Activity: Not on file  Stress: Not on file  Social Connections: Not on file    She is married for 25 years has 2 children a son age 50 and a daughter age 6  Family History:  The patient's family history includes Alcohol abuse in her father and paternal grandfather; Dementia (age of onset: 29) in her maternal grandfather; Heart attack in her father and paternal grandfather; Heart attack (age of onset: 49) in her brother; Heart disease in her father, maternal grandmother, and paternal grandfather; Heart disease (age of onset: 24) in her brother; Heart failure in her maternal grandfather and  maternal grandmother; Hyperlipidemia in her brother, father, mother, and paternal grandfather; Hypertension in her brother, father, and paternal grandfather; Kidney disease in her father; Osteoporosis in her mother; Stroke in her maternal grandfather and paternal grandfather; Valvular heart disease in her maternal grandmother.   Her father died at age 35 and had a myocardial infarction.  Her mother is 64.  She has 1 brother age 58 of an MI and 1 deceased sister age 37.  ROS General: Negative; No fevers, chills, or night sweats;  HEENT: Negative; No changes in vision or hearing, sinus congestion, difficulty swallowing Pulmonary: Negative; No cough, wheezing, shortness of breath, hemoptysis Cardiovascular: Negative; No chest pain, presyncope, syncope, palpitations GI: Negative; No nausea, vomiting, diarrhea, or abdominal pain GU: Negative; No dysuria, hematuria, or difficulty voiding Musculoskeletal: Negative; no myalgias, joint pain, or weakness Hematologic/Oncology: Negative; no easy bruising, bleeding Endocrine: Negative; no heat/cold intolerance; no diabetes Neuro: Negative; no changes in balance, headaches Skin: Negative; No rashes or skin lesions Psychiatric: Negative; No behavioral problems, depression Sleep: Positive for OSA documented on her sleep study with previous fatigue, snoring, daytime sleepiness, and nonrestorative sleep.  Since worsening CPAP therapy her sleep is nonrestorative.  There is resolution of prior snoring.  New Epworth Sleepiness Scale score is In the office today and this endorsed at 8 arguing against residual daytime sleepiness Other comprehensive 14 point system review is negative.   PHYSICAL EXAM:   VS:  BP (!) 148/104 (BP Location: Left Arm, Patient Position: Sitting, Cuff Size: Normal)   Pulse 96   Ht '5\' 2"'  (1.575 m)   Wt 159 lb (72.1 kg)   LMP  (LMP Unknown)   BMI 29.08 kg/m     Repeat blood pressure by me was elevated at 160/100.    She states that  since she has started CPAP therapy she has lost 35 pounds.  Wt Readings from Last 3 Encounters:  11/20/20 159 lb (72.1 kg)  11/02/20 153 lb (69.4 kg)  08/28/20 153 lb (69.4 kg)    General: Alert, oriented,  no distress.  Skin: normal turgor, no rashes, warm and dry HEENT: Normocephalic, atraumatic. Pupils equal round and reactive to light; sclera anicteric; extraocular muscles intact;  Nose without nasal septal hypertrophy Mouth/Parynx benign; Mallinpatti scale 3 Neck: No JVD, no carotid bruits; normal carotid upstroke Lungs: clear to ausculatation and percussion; no wheezing or rales Chest wall: without tenderness to palpitation Heart: PMI not displaced, RRR, s1 s2 normal, 1/6 systolic murmur, no diastolic murmur, no rubs, gallops, thrills, or heaves Abdomen: soft, nontender; no hepatosplenomehaly, BS+; abdominal aorta nontender and not dilated by palpation. Back: no CVA tenderness Pulses 2+ Musculoskeletal: full range of motion, normal strength, no joint deformities Extremities: no clubbing cyanosis or edema, Homan's sign negative  Neurologic: grossly nonfocal; Cranial nerves grossly wnl Psychologic: Normal mood and affect   Studies/Labs Reviewed:   EKG:  EKG is ordered today.  ECG (independently read by me): Normal sinus rhythm at 96 bpm.  QS complex V1 V2 with poor R wave progression.  Normal intervals  Recent Labs: BMP Latest Ref Rng & Units 04/09/2020 12/12/2019 12/07/2019  Glucose 65 - 99 mg/dL 90 91 102(H)  BUN 6 - 24 mg/dL '13 13 12  ' Creatinine 0.57 - 1.00 mg/dL 0.74 0.76 0.81  BUN/Creat Ratio 9 - 23 18 - 15  Sodium 134 - 144 mmol/L 135 137 136  Potassium 3.5 - 5.2 mmol/L 3.9 4.2 4.6  Chloride 96 - 106 mmol/L 98 103 100  CO2 20 - 29 mmol/L '20 27 23  ' Calcium 8.7 - 10.2 mg/dL 9.2 9.0 9.8     Hepatic Function Latest Ref Rng & Units 12/12/2019 06/06/2019 12/07/2018  Total Protein 6.0 - 8.3 g/dL 6.5 6.4 6.5  Albumin 3.5 - 5.2 g/dL 3.9 4.0 3.8  AST 0 - 37 U/L '13 17 13   ' ALT 0 - 35 U/L '11 17 9  ' Alk Phosphatase 39 - 117 U/L 38(L) 48 42  Total Bilirubin 0.2 - 1.2 mg/dL 0.4 0.5 0.4  Bilirubin, Direct 0.0 - 0.3 mg/dL - - -    CBC Latest Ref Rng & Units 12/12/2019 06/06/2019 12/07/2018  WBC 4.0 - 10.5 K/uL 9.5 7.2 7.0  Hemoglobin 12.0 - 15.0 g/dL 14.3 13.7 13.6  Hematocrit 36.0 - 46.0 % 42.4 40.9 40.6  Platelets 150.0 - 400.0 K/uL 205.0 210.0 223.0   Lab Results  Component Value Date   MCV 91.4 12/12/2019   MCV 91.7 06/06/2019   MCV 92.0 12/07/2018   Lab Results  Component Value Date   TSH 0.02 (L) 06/18/2020   No results found for: HGBA1C   BNP No results found for: BNP  ProBNP No results found for: PROBNP   Lipid Panel     Component Value Date/Time   CHOL 253 (H) 12/12/2019 1054   TRIG (H) 12/12/2019 1054    528.0 Triglyceride is over 400; calculations on Lipids are invalid.   HDL 37.00 (L) 12/12/2019 1054   CHOLHDL 7 12/12/2019 1054   VLDL 49.6 (H) 12/07/2018 1108   LDLCALC 136 (H) 11/22/2012 1416   LDLDIRECT 128.0 12/12/2019 1054     RADIOLOGY: No results found.   Additional studies/ records that were reviewed today include:    AUGUST 124 2021 CLINICAL INFORMATION Sleep Study Type: HST   Indication for sleep study: snoring, fatigue, difficult to control hypertension,    Epworth Sleepiness Score: 3   SLEEP STUDY TECHNIQUE A multi-channel overnight portable sleep study was performed. The channels recorded were: nasal airflow, thoracic respiratory movement, and oxygen saturation with a pulse  oximetry. Snoring was also monitored.   MEDICATIONS Patient self administered medications include: N/A.   SLEEP ARCHITECTURE Patient was studied for 506.2 minutes. The sleep efficiency was 100.0 % and the patient was supine for 41.3%. The arousal index was 0.0 per hour.   RESPIRATORY PARAMETERS The overall AHI was 8.9 per hour, with a central apnea index of 0.0 per hour.   The oxygen nadir was 83% during sleep.   CARDIAC  DATA Mean heart rate during sleep was 78.6 bpm.   IMPRESSIONS - Mild obstructive sleep apnea occurred during this study (AHI = 8.9/h). The severity of her sleep disordered breathing during REM sleep cannot be assessed on this home stiudy. - No significant central sleep apnea occurred during this study (CAI = 0.0/h). - Moderate oxygen desaturation to a nadir of 83%. - Patient snored 11.5% during the sleep.   DIAGNOSIS - Obstructive Sleep Apnea (G47.33)   RECOMMENDATIONS - In this patient with significant cardiovascular comorbidities including difficult to control hypertension, hyperlipidemia, TIA, strong FH for CAD consider an in-lab therapeutic CPAP titration to determine optimal pressure required to alleviate sleep disordered breathing. If unable to have an in-lab study, recommend Auto-PAP initiation at 6-16 cm of water. - Effort should be made to optimize nasal and oropharyngeal patency. - A customized oral appliance may be considered as an alternative. - Avoid alcohol, sedatives and other CNS depressants that may worsen sleep apnea and disrupt normal sleep architecture. - Sleep hygiene should be reviewed to assess factors that may improve sleep quality. - Weight management and regular exercise should be initiated or continued. - Recommend a sleep clinic evaluation after CPAP initiation.    ASSESSMENT:    1. OSA (obstructive sleep apnea)      PLAN:  Ms. Geana Walts is a 45 year old female who has cardiovascular comorbidities including hypertension, mixed hyperlipidemia with significant hypertriglyceridemia, as well as remote TIA.  She was diagnosed with mild overall obstructive sleep apnea on a home sleep study in August 2021.  Apparently, she was not set up with CPAP therapy until September 04, 2020 and received a ResMed air sense 11 AutoSet CPAP unit.  Previously, she had experienced significant fatigue, snoring, as well as difficulty in controlling blood pressure.  Since initiating CPAP  therapy, she admits to feeling significantly improved with reference to her sleep.  She has had issues with uterine prolapse discomfort creating some sleep difficulty due to pain.  Her most recent download from October 9 through November 19, 2020 shows that she is meeting compliance standards.  However, average use of CPAP was only 5 hours and 31 minutes.  I discussed optimal sleep duration for an adult at 7 to 9 atm.  On her most recent settings at 6 to 16 cm of water, AHI is excellent at 1.4 and her 95th percentile pressure is 13.3 with maximum average pressure at 14.5.  As result, I recommended a change in her CPAP settings and will increase this to 7 to 18 cm of water.  She has been using a ResMed AirFit N 30i mask which is seems to do well.  She does not have any mask leak.  She has no significant benefit in her previous nocturia which was 4-5 times per night and is now approximately 1 time per evening.  During this evaluation a lengthy discussion with her and discussed the adverse consequences of obstructive sleep apnea on normal sleep architecture.  In addition I discussed its  potential adverse cardiovascular consequences with potential difficulty in  blood pressure control with potential absence of the normal diastolic dip with sleep as well as effects on potential nocturnal arrhythmias, palpitations, increase incidence of atrial fibrillation, as well as nocturnal hypoxemia potentially contributing to nocturnal ischemia both cardiac or cerebrovascular Lee.  We also discussed its implications with reference to glucose metabolism, inflammation, as well as GERD.  She continues to have blood pressure lability.  Hopefully with improved CPAP use and therapy this will improve her somewhat refractory hypertension.  Her blood pressure today was elevated.  I suggested further titration of her enalapril from 5 mg daily to 10 mg daily.  Apparently she is no longer taking her carvedilol.  She is on a prolonged 12.5 mg  daily for aldosterone blockade.  She tells me she may need to have her tonsils removed and is planning to see Dr. Orvil Feil for further evaluation.  With tonsillectomy I suspect her obstructive sleep apnea will also improve as well.  She will continue current therapy.  As long as she is stable I will see her in 1 year for reevaluation.  Time spent : 40 minutes   Medication Adjustments/Labs and Tests Ordered: Current medicines are reviewed at length with the patient today.  Concerns regarding medicines are outlined above.  Medication changes, Labs and Tests ordered today are listed in the Patient Instructions below. Patient Instructions  Medication Instructions:  Start enalapril 10 mg in the morning (one tablet). Take enalapril 5 mg (one-half tablet) in the evening.  Stop taking carvedilol.  *If you need a refill on your cardiac medications before your next appointment, please call your pharmacy*   Follow-Up: At [facility], you and your health needs are our priority.  As part of our continuing mission to provide you with exceptional heart care, we have created designated Provider Care Teams.  These Care Teams include your primary Cardiologist (physician) and Advanced Practice Providers (APPs -  Physician Assistants and Nurse Practitioners) who all work together to provide you with the care you need, when you need it.  We recommend signing up for the patient portal called "MyChart".  Sign up information is provided on this After Visit Summary.  MyChart is used to connect with patients for Virtual Visits (Telemedicine).  Patients are able to view lab/test results, encounter notes, upcoming appointments, etc.  Non-urgent messages can be sent to your provider as well.   To learn more about what you can do with MyChart, go to [email or web address].    Your next appointment:   12 month(s)  The format for your next appointment:   In Person  Provider:   Dr. Corky Downs, MD   Other  Instructions Keep appointment with dr. Oval Linsey as scheduled.    Signed, Shelva Majestic, MD  12/08/2020 6:11 PM    [facility] [address] [place], [place], [place]  [address] Phone: [phone]

## 2020-12-17 NOTE — Progress Notes (Signed)
[facility] Pre-Operative visit  Subjective Chief Complaint: Kristen [name] presents for a preoperative encounter.   History of Present Illness: Kristen [name] is a 45 y.o. female who presents for preoperative visit.  She is scheduled to undergo robotic assisted total laparoscopic hysterectomy, bilateral salpingectomy, sacrocolpopexy, cystoscopy on 12/31/20.  Her symptoms include recurrent vaginal bulge, and she was was found to have Stage II anterior, Stage II posterior, Stage II apical prolapse . Previously had A/P repair and sling.   Past Medical History:  Diagnosis Date   Abnormal cervical cytology 09/26/2011   LGSIL in 2011, patient reports repeat pap was normal, has had intermittent abnormals with bx in past but always normalized, has had cryotherapy with good results Menarche at 17, irregular without birth control at times, very heavy LMP 08/26/2011 No gyn concerns, MGM last year normal    ADD (attention deficit disorder)    Allergy    seasonal   Anemia    blood loss   Asthma    mild, intermittent   Bronchitis, acute 12/16/2010   Chest pain, atypical 10/08/2010   Chicken pox as a child   Concussion 09/24/2010   Contraceptive management 02/17/2011   Decreased libido 09/26/2011   Depression 2002   post partum   Family history of heart disease    Female bladder prolapse, acquired 09/24/2010   Fibromyalgia    Great toe pain, right 06/02/2017   Headache 09/26/2013   History of concussion 09/24/2010   History of migraines 09/24/2010   HSV-2 infection 09/24/2010   HTN (hypertension) 09/24/2010   Hyperlipidemia    Hyperlipidemia    Hypertension    Hypertriglyceridemia 08/11/2019   Interstitial cystitis 02/18/2011   Left shoulder pain 05/27/2016   Lymphadenopathy 09/24/2010   Migraine 09/24/2010   Migraine aura, persistent, with cerebral infarct, status over 72 hours 09/24/2010   Myalgia 06/08/2018   Neck pain, musculoskeletal 12/16/2010   OSA (obstructive sleep apnea) 08/28/2020    Overweight 05/29/2015   Overweight(278.02) 12/26/2011   Pharyngitis 05/27/2016   Poor concentration 12/16/2010   Preventative health care 11/28/2012   Recurrent UTI    Renal lithiasis 09/24/2010   RUQ pain 05/27/2011   Subacute and chronic vaginitis 09/24/2010   Thyroid disease    Tick bite 12/04/2013   Transient left leg weakness 05/21/2014   Vaginitis and vulvovaginitis 05/21/2014     Past Surgical History:  Procedure Laterality Date   ANTERIOR AND POSTERIOR REPAIR     INCONTINENCE SURGERY  01/13/2001    is allergic to peanut-containing drug products, clindamycin/lincomycin, influenza vaccines, hydralazine, and terazol [terconazole].   Family History  Problem Relation Age of Onset   Osteoporosis Mother    Hyperlipidemia Mother    Kidney disease Father    Heart attack Father    Hyperlipidemia Father    Hypertension Father    Alcohol abuse Father    Heart disease Father    Heart attack Brother 65   Hypertension Brother    Heart disease Brother 79       MI   Hyperlipidemia Brother    Heart disease Maternal Grandmother    Heart failure Maternal Grandmother    Valvular heart disease Maternal Grandmother    Dementia Maternal Grandfather 92       early stages   Stroke Maternal Grandfather    Heart failure Maternal Grandfather    Heart attack Paternal Grandfather    Stroke Paternal Grandfather    Heart disease Paternal Grandfather    Alcohol abuse  Paternal Grandfather    Hyperlipidemia Paternal Grandfather    Hypertension Paternal Grandfather     Social History   Tobacco Use   Smoking status: Never   Smokeless tobacco: Never  Vaping Use   Vaping Use: Never used  Substance Use Topics   Alcohol use: No    Alcohol/week: 0.0 standard drinks    Comment: 0   Drug use: No     Review of Systems was negative for a full 10 system review except as noted in the History of Present Illness.   Current Outpatient Medications:    acyclovir (ZOVIRAX) 200 MG capsule, TAKE 1  CAPSULE BY MOUTH TWICE A DAY INS LIMIT OF 30 DAYS, Disp: 60 capsule, Rfl: 11   albuterol (VENTOLIN HFA) 108 (90 Base) MCG/ACT inhaler, INHALE 2 PUFFS INTO THE LUNGS EVERY 4 HOURS AS NEEDED FOR WHEEZE OR FOR SHORTNESS OF BREATH, Disp: 18 each, Rfl: 2   amphetamine-dextroamphetamine (ADDERALL) 20 MG tablet, Take 1 tablet (20 mg total) by mouth 2 (two) times daily., Disp: 60 tablet, Rfl: 0   enalapril (VASOTEC) 10 MG tablet, Take 10 mg (one tablet) in the morning.Take 5 mg (one-half tablet) in the evening., Disp: 120 tablet, Rfl: 3   EPINEPHrine 0.3 mg/0.3 mL IJ SOAJ injection, Inject 0.3 mLs (0.3 mg total) into the muscle as needed for anaphylaxis., Disp: 2 each, Rfl: 2   fluconazole (DIFLUCAN) 150 MG tablet, Take 150 mg by mouth once a week., Disp: , Rfl:    HYDROcodone-acetaminophen (NORCO) 5-325 MG tablet, Take 1 tablet by mouth every 6 (six) hours as needed for moderate pain or severe pain., Disp: 90 tablet, Rfl: 0   lidocaine (XYLOCAINE) 2 % jelly, APPLY TO AFFECTED AREA DAILY AS NEEDED, Disp: 20 mL, Rfl: 2   liothyronine (CYTOMEL) 5 MCG tablet, TAKE 1 TABLET BY MOUTH EVERY DAY, Disp: 90 tablet, Rfl: 1   neomycin-polymyxin b-dexamethasone (MAXITROL) 3.5-10000-0.1 SUSP, Place 1 drop into both eyes in the morning, at noon, in the evening, and at bedtime., Disp: 5 mL, Rfl: 1   nitrofurantoin, macrocrystal-monohydrate, (MACROBID) 100 MG capsule, TAKE 1 CAPSULE FOR 1 DOSE AS NEEDED FOR PREVENTION OF UTI *AFTER SELF CATH, SWIMMING, HOT TUB, SEX*, Disp: 30 capsule, Rfl: 8   Norethindrone Acetate-Ethinyl Estrad-FE (BLISOVI 24 FE) 1-20 MG-MCG(24) tablet, Take 1 tablet by mouth daily., Disp: 28 tablet, Rfl: 11   SYMBICORT 160-4.5 MCG/ACT inhaler, TAKE 2 PUFFS BY MOUTH TWICE A DAY, Disp: 10.2 Inhaler, Rfl: 5   SYNTHROID 137 MCG tablet, TAKE 1 TABLET BY MOUTH EVERY DAY, Disp: 90 tablet, Rfl: 1   Objective Vitals:   12/19/20 0851  BP: (!) 169/122  Pulse: 99    Gen: NAD CV: S1 S2 RRR Lungs: Clear to  auscultation bilaterally Abd: soft, nontender   Previous Pelvic Exam showed: POP-Q (11/02/20):    POP-Q   -1                                            Aa   -1                                           Ba   -1  C    4                                            Gh   5                                            Pb   7                                            tvl    -1                                            Ap   -1                                            Bp   -5.5                                              D         Assessment/ Plan  Assessment: The patient is a 45 y.o. year old scheduled to undergo robotic assisted total laparoscopic hysterectomy, bilateral salpingectomy, sacrocolpopexy, cystoscopy. Verbal consent was obtained for these procedures.  Plan: General Surgical Consent: The patient has previously been counseled on alternative treatments, and the decision by the patient and provider was to proceed with the procedure listed above.  For all procedures, there are risks of bleeding, infection, damage to surrounding organs including but not limited to bowel, bladder, blood vessels, ureters and nerves, and need for further surgery if an injury were to occur. These risks are all low with minimally invasive surgery.   There are risks of numbness and weakness at any body site or buttock/rectal pain.  It is possible that baseline pain can be worsened by surgery, either with or without mesh. If surgery is vaginal, there is also a low risk of possible conversion to laparoscopy or open abdominal incision where indicated. Very rare risks include blood transfusion, blood clot, heart attack, pneumonia, or death.   There is also a risk of short-term postoperative urinary retention with need to use a catheter. About half of patients need to go home from surgery with a catheter, which is then later removed in the office. The  risk of long-term need for a catheter is very low. There is also a risk of worsening of overactive bladder.     Prolapse (with or without mesh): Risk factors for surgical failure  include things that put pressure on your pelvis and the surgical repair, including obesity, chronic cough, and heavy lifting or straining (including lifting children or adults, straining on the toilet, or lifting heavy objects such as furniture or anything weighing >25 lbs. Risks of recurrence is 20-30% with vaginal native tissue repair and a less  than 10% with sacrocolpopexy with mesh.    Sacrocolpopexy: Mesh implants may provide more prolapse support, but do have some unique risks to consider. It is important to understand that mesh is permanent and cannot be easily removed. Risks of abdominal sacrocolpopexy mesh include mesh exposure (~3-6%), painful intercourse (recent studies show lower rates after surgery compared to before, with ~5-8% risk of new onset), and very rare risks of bowel or bladder injury or infection (<1%). The risk of mesh exposure is more likely in a woman with risks for poor healing (prior radiation, poorly controlled diabetes, or immunocompromised). The risk of new or worsened chronic pain after mesh implant is more common in women with baseline chronic pain and/or poorly controlled anxiety or depression. There is an FDA safety notification on vaginal mesh procedures for prolapse but NOT abdominal mesh procedures and therefore does not apply to your surgery. We have extensive experience and training with mesh placement and we have close postoperative follow up to identify any potential complications from mesh.    We discussed consent for blood products. Risks for blood transfusion include allergic reactions, other reactions that can affect different body organs and managed accordingly, transmission of infectious diseases such as HIV or Hepatitis. However, the blood is screened. Patient consents for blood  products.  Pre-operative instructions:  She was instructed to not take Aspirin/NSAIDs x 7days prior to surgery. Antibiotic prophylaxis was ordered as indicated.  Cathter use: Patient will go home with foley if needed after post-operative voiding trial.  Post-operative instructions:  She was provided with specific post-operative instructions, including precautions and signs/symptoms for which we would recommend contacting us, in addition to daytime and after-hours contact phone numbers. This was provided on a handout.   Post-operative medications: Prescriptions for motrin, tylenol, miralax, and oxycodone were sent to her pharmacy. Discussed using ibuprofen and tylenol on a schedule to limit use of narcotics.   Laboratory testing:  CBC and POC urine pregnancy test will be ordered  Preoperative clearance:  She does not require surgical clearance.    Post-operative follow-up:  A post-operative appointment will be made for 6 weeks from the date of surgery. If she needs a post-operative nurse visit for a voiding trial, that will be set up after she leaves the hospital.    Patient will call the clinic or use MyChart should anything change or any new issues arise.   Marguerita Beards, MD

## 2020-12-18 ENCOUNTER — Encounter (HOSPITAL_BASED_OUTPATIENT_CLINIC_OR_DEPARTMENT_OTHER): Payer: Self-pay | Admitting: Cardiovascular Disease

## 2020-12-18 ENCOUNTER — Other Ambulatory Visit: Payer: Self-pay

## 2020-12-18 ENCOUNTER — Ambulatory Visit (INDEPENDENT_AMBULATORY_CARE_PROVIDER_SITE_OTHER): Payer: 59 | Admitting: Cardiovascular Disease

## 2020-12-18 DIAGNOSIS — I10 Essential (primary) hypertension: Secondary | ICD-10-CM

## 2020-12-18 DIAGNOSIS — G4733 Obstructive sleep apnea (adult) (pediatric): Secondary | ICD-10-CM | POA: Diagnosis not present

## 2020-12-18 DIAGNOSIS — E782 Mixed hyperlipidemia: Secondary | ICD-10-CM

## 2020-12-18 NOTE — Assessment & Plan Note (Signed)
Her blood pressure continues to be uncontrolled but is much better since starting on her CPAP and since she had her neck surgery.  We did discuss trying clonidine next.  She is hesitant to try anything until after her upcoming surgery.  She hopes that after her hysterectomy she will no longer be in pain and she will no longer need to be on birth control, which should also help.  She mentioned that she did tolerate verapamil treatment for migraines in the past.  Although this is not typically used for hypertension, given that she tolerated it, we can certainly try this if her blood pressure remains high after surgery.  She was never taking eplerenone.  We will take that off her medication list.  Continue enalapril for now.

## 2020-12-18 NOTE — Assessment & Plan Note (Signed)
Continue CPAP.  

## 2020-12-18 NOTE — Patient Instructions (Addendum)
Medication Instructions:  Your physician recommends that you continue on your current medications as directed. Please refer to the Current Medication list given to you today.  Labwork: NONE  Testing/Procedures: NONE  Follow-Up: 2/8/82023 AT 10:30 WITH DR [facility]

## 2020-12-18 NOTE — Progress Notes (Signed)
Advanced Hypertension Clinic Follow up Assessment:    Date:  12/18/2020   ID:  Kristen [name], DOB 1975-12-31, MRN [id number]  PCP:  Bradd Canary, MD  Cardiologist:  None  Nephrologist:  Referring MD: Bradd Canary, MD   CC: Hypertension  History of Present Illness:    Kristen [name] is a 45 y.o. female with a hx of hypertension and hyperlipidemia here for follow-up. She initially established care in the hypertension clinic 08/11/2019. She struggled with low BP for years.  Then her BP spiked 09/2013.  She had a TIA and saw Dr. Allyson Sabal.  She continued to have intermittent spiking of her BP.  She struggled with several medications and finally went on Bystolic.  Her BP was well-controlled but she gained weight and had heaviness in her L arm.  She stopped it and felt much better. Her BP started to be higher after a stomach bug and tick bite.  She was treated with enalapril and the dose was increased.  Initially it worked well but stopped being as effective.  She had to start back on Bystolic but is gaining weight, fatigued, and has heaviness in her chest.  Her blood pressure is controlled but the symptoms are manageable.  She has not had any orthopnea or PND but does sometimes have edema that she attributes to enalapril.  Overall her diet has been good. Her mom had heart disease at a young age so she is used to eating a healthy diet.  Lean meat and limited salt intake.  She cooks at home.  She doesn't drink caffeine or EtOH.  She takes magnesium, B12 injections, vitamin ADEK.  She has been taking Adderall since 8th grade.  She stopped taking it for a few days and her BP was still high.  She has a history of hypertriglyceridemia since she was in middle school.  She has taken fish oil without much change in her numbers.  She is also taking Krill oil.  What helped most was drinking warm water with lemon.  She is averse to trying statins because many family members have had muscle aches.  Of note, her father had a  heart attack in his 45s.  Her brother had 2 heart attacks in his 42s.  Her mother has heart rhythm disorders.  At her initial appointment she was started on amlodipine. She stopped enalapril and Bystolic. Since then she saw our pharmacist and increased spironolactone 12/2019.  She was referred to the prep program through the [facility]. Her blood pressure was still elevated so she was started on hydralazine. She took one dose and it caused bladder issues, so she was switched to doxasozin. She also stopped spironolactone due to leg cramps. She saw our pharmacist on 02/2020 and switched from enalapril to olmesartan. She would not start carvedilol because it caused fatigue for her mother. She stopped olmesartan before follow-up with our pharmacist in March.  At last visit, her BP was still above goal. She was started on a low dose of carvedilol. She was also encouraged to follow-up with the sleep team and start a CPAP.  Today, she has been doing well. Since receiving her CPAP, her blood pressure has been well controlled. She noticed her home measurements were 135-140 systolic and 95-100 diastolic. She had 7 readings that were in normal range. This morning, her blood pressure was 131/94. She believes the high reading in clinic was due to her rushing to her appointment. She had her tonsils removed 2  weeks ago because she had a 4 inch growth behind her tonsils. Her surgeon mentioned her blood pressure should decrease after the healing process because the growth was causing pressure against her neck and nerves. She experienced worsened asthma and swelling under Carvedilol. Her asthma started bothering her on Wednesday and it continued to progress until Sunday when she developed bronchitis. These symptoms took longer to manifest on Carvedilol than they did on Bystolic. As a teenager, she had headaches that was treated with verapamil. She wonders if this medicine can help her with managing her current blood pressure since she  knows she can tolerate it. Of note, she is scheduled to undergo a robotic hysterectomy with bilateral salpingectomy. After the surgery, she will be off of birth control. She denies any palpitations, chest pain, lightheadedness, syncope, orthopnea, PND, or exertional symptoms.  Previous antihypertensives: HCTZ - AKI, bladder issues, and kidney stones Losartan-didn't work Bystolic- fatigue, arm heaviness Enalapril- swelling at higher dose, fatigue. Amlodipine - urine retention, edema, leg tighten, elevated HR and BP Hydralazine - urinary retention Spironolactone - muscle cramping Doxazosin - would not take due to side effects listed in PI (weight gain, fatigue)    Past Medical History:  Diagnosis Date   Abnormal cervical cytology 09/26/2011   LGSIL in 2011, patient reports repeat pap was normal, has had intermittent abnormals with bx in past but always normalized, has had cryotherapy with good results Menarche at 17, irregular without birth control at times, very heavy LMP 08/26/2011 No gyn concerns, MGM last year normal    ADD (attention deficit disorder)    Allergy    seasonal   Anemia    blood loss   Asthma    mild, intermittent   Bronchitis, acute 12/16/2010   Chest pain, atypical 10/08/2010   Chicken pox as a child   Concussion 09/24/2010   Contraceptive management 02/17/2011   Decreased libido 09/26/2011   Depression 2002   post partum   Family history of heart disease    Female bladder prolapse, acquired 09/24/2010   Fibromyalgia    Great toe pain, right 06/02/2017   Headache 09/26/2013   History of concussion 09/24/2010   History of migraines 09/24/2010   HSV-2 infection 09/24/2010   HTN (hypertension) 09/24/2010   Hyperlipidemia    Hyperlipidemia    Hypertension    Hypertriglyceridemia 08/11/2019   Interstitial cystitis 02/18/2011   Left shoulder pain 05/27/2016   Lymphadenopathy 09/24/2010   Migraine 09/24/2010   Migraine aura, persistent, with cerebral infarct, status over 72  hours 09/24/2010   Myalgia 06/08/2018   Neck pain, musculoskeletal 12/16/2010   OSA (obstructive sleep apnea) 08/28/2020   Overweight 05/29/2015   Overweight(278.02) 12/26/2011   Pharyngitis 05/27/2016   Poor concentration 12/16/2010   Preventative health care 11/28/2012   Recurrent UTI    Renal lithiasis 09/24/2010   RUQ pain 05/27/2011   Subacute and chronic vaginitis 09/24/2010   Thyroid disease    Tick bite 12/04/2013   Transient left leg weakness 05/21/2014   Vaginitis and vulvovaginitis 05/21/2014    Past Surgical History:  Procedure Laterality Date   ANTERIOR AND POSTERIOR REPAIR     INCONTINENCE SURGERY  01/13/2001    Current Medications: Current Meds  Medication Sig   acyclovir (ZOVIRAX) 200 MG capsule TAKE 1 CAPSULE BY MOUTH TWICE A DAY INS LIMIT OF 30 DAYS   albuterol (VENTOLIN HFA) 108 (90 Base) MCG/ACT inhaler INHALE 2 PUFFS INTO THE LUNGS EVERY 4 HOURS AS NEEDED FOR WHEEZE OR FOR SHORTNESS  OF BREATH   amphetamine-dextroamphetamine (ADDERALL) 20 MG tablet Take 1 tablet (20 mg total) by mouth 2 (two) times daily.   enalapril (VASOTEC) 10 MG tablet Take 10 mg (one tablet) in the morning.Take 5 mg (one-half tablet) in the evening.   EPINEPHrine 0.3 mg/0.3 mL IJ SOAJ injection Inject 0.3 mLs (0.3 mg total) into the muscle as needed for anaphylaxis.   fluconazole (DIFLUCAN) 150 MG tablet Take 150 mg by mouth once a week.   HYDROcodone-acetaminophen (NORCO) 5-325 MG tablet Take 1 tablet by mouth every 6 (six) hours as needed for moderate pain or severe pain.   lidocaine (XYLOCAINE) 2 % jelly APPLY TO AFFECTED AREA DAILY AS NEEDED   liothyronine (CYTOMEL) 5 MCG tablet TAKE 1 TABLET BY MOUTH EVERY DAY   neomycin-polymyxin b-dexamethasone (MAXITROL) 3.5-10000-0.1 SUSP Place 1 drop into both eyes in the morning, at noon, in the evening, and at bedtime.   nitrofurantoin, macrocrystal-monohydrate, (MACROBID) 100 MG capsule TAKE 1 CAPSULE FOR 1 DOSE AS NEEDED FOR PREVENTION OF UTI *AFTER SELF  CATH, SWIMMING, HOT TUB, SEX*   Norethindrone Acetate-Ethinyl Estrad-FE (BLISOVI 24 FE) 1-20 MG-MCG(24) tablet Take 1 tablet by mouth daily.   SYMBICORT 160-4.5 MCG/ACT inhaler TAKE 2 PUFFS BY MOUTH TWICE A DAY   SYNTHROID 137 MCG tablet TAKE 1 TABLET BY MOUTH EVERY DAY   [DISCONTINUED] eplerenone (INSPRA) 25 MG tablet TAKE 1/2 TABLET BY MOUTH EVERY DAY     Allergies:   Peanut-containing drug products, Clindamycin/lincomycin, Influenza vaccines, Hydralazine, and Terazol [terconazole]   Social History   Socioeconomic History   Marital status: Married    Spouse name: Not on file   Number of children: Not on file   Years of education: Not on file   Highest education level: Not on file  Occupational History   Not on file  Tobacco Use   Smoking status: Never   Smokeless tobacco: Never  Vaping Use   Vaping Use: Never used  Substance and Sexual Activity   Alcohol use: No    Alcohol/week: 0.0 standard drinks    Comment: 0   Drug use: No   Sexual activity: Yes    Partners: Male    Birth control/protection: Pill  Other Topics Concern   Not on file  Social History Narrative   Lives with husband 2 children in a one story home.     Works as a Teacher, adult education.     Education: college.   Social Determinants of Health   Financial Resource Strain: Not on file  Food Insecurity: Not on file  Transportation Needs: Not on file  Physical Activity: Not on file  Stress: Not on file  Social Connections: Not on file     Family History: The patient's family history includes Alcohol abuse in her father and paternal grandfather; Dementia (age of onset: 63) in her maternal grandfather; Heart attack in her father and paternal grandfather; Heart attack (age of onset: 69) in her brother; Heart disease in her father, maternal grandmother, and paternal grandfather; Heart disease (age of onset: 75) in her brother; Heart failure in her maternal grandfather and maternal grandmother; Hyperlipidemia in  her brother, father, mother, and paternal grandfather; Hypertension in her brother, father, and paternal grandfather; Kidney disease in her father; Osteoporosis in her mother; Stroke in her maternal grandfather and paternal grandfather; Valvular heart disease in her maternal grandmother.  ROS:   Please see the history of present illness.    (+) Asthma (+) Swelling All other systems reviewed and are  negative.  EKGs/Labs/Other Studies Reviewed:    Coronary Calcium Score 08/30/2019: FINDINGS: Non-cardiac: See separate report from [facility]. Ascending Aorta: Pericardium: Normal Coronary arteries: No coronary calcification. IMPRESSION: Coronary calcium score of 0. This was 0 percentile for age and sex matched control.  EKG:  EKG was not ordered today 08/28/2020: Sinus rhythm. Rate 96 bpm. 08/11/2019: sinus rhythm.  Rate 88 bpm.  Recent Labs: 04/09/2020: BUN 13; Creatinine, Ser 0.74; Potassium 3.9; Sodium 135 06/18/2020: TSH 0.02   Recent Lipid Panel    Component Value Date/Time   CHOL 253 (H) 12/12/2019 1054   TRIG (H) 12/12/2019 1054    528.0 Triglyceride is over 400; calculations on Lipids are invalid.   HDL 37.00 (L) 12/12/2019 1054   CHOLHDL 7 12/12/2019 1054   VLDL 49.6 (H) 12/07/2018 1108   LDLCALC 136 (H) 11/22/2012 1416   LDLDIRECT 128.0 12/12/2019 1054    Physical Exam:    VS:  BP (!) 144/96 (BP Location: Left Arm, Patient Position: Sitting, Cuff Size: Normal)   Ht 5\' 2"  (1.575 m)   Wt 159 lb (72.1 kg)   BMI 29.08 kg/m  , BMI Body mass index is 29.08 kg/m. GENERAL:  Well appearing HEENT: Pupils equal round and reactive, fundi not visualized, oral mucosa unremarkable NECK:  No jugular venous distention, waveform within normal limits, carotid upstroke brisk and symmetric, no bruits LUNGS:  Clear to auscultation bilaterally HEART:  RRR.  PMI not displaced or sustained, S1 and S2 within normal limits, no S3, no S4, no clicks, no rubs, no murmurs ABD:  Flat,  positive bowel sounds normal in frequency in pitch, no bruits, no rebound, no guarding, no midline pulsatile mass, no hepatomegaly, no splenomegaly EXT:  2 plus pulses throughout, no edema, no cyanosis no clubbing.  L ankle brace SKIN:  No rashes no nodules NEURO:  Cranial nerves II through XII grossly intact, motor grossly intact throughout PSYCH:  Cognitively intact, oriented to person place and time   ASSESSMENT:    1. Primary hypertension   2. OSA (obstructive sleep apnea)   3. Mixed hyperlipidemia     PLAN:   HTN (hypertension) Her blood pressure continues to be uncontrolled but is much better since starting on her CPAP and since she had her neck surgery.  We did discuss trying clonidine next.  She is hesitant to try anything until after her upcoming surgery.  She hopes that after her hysterectomy she will no longer be in pain and she will no longer need to be on birth control, which should also help.  She mentioned that she did tolerate verapamil treatment for migraines in the past.  Although this is not typically used for hypertension, given that she tolerated it, we can certainly try this if her blood pressure remains high after surgery.  She was never taking eplerenone.  We will take that off her medication list.  Continue enalapril for now.  OSA (obstructive sleep apnea) Continue CPAP.  Hyperlipidemia We will address her lipids once her blood pressure is better controlled.  She has so many medication intolerances that is challenging to start multiple meds at one time.  Secondary Causes of Hypertension  Medications/Herbal: OCP, steroids, stimulants, antidepressants, weight loss medication, immune suppressants, NSAIDs, sympathomimetics, alcohol, caffeine, licorice, ginseng, St. John's wort, chemo  Sleep Apnea: will order sleep study for snoring and daytime somnolence Renal artery stenosis:-renal artery Dopplers negative 11/2013 Hyperaldosteronism- (testing not  indicated) Hyper/hypothyroidism (thyroid- subclinical hyperthryoid 05/2019.  Managed by Dr. 06/2019 Pheochromocytoma: (testing  not indicated) Cushing's syndrome: (testing not indicated) Coarctation of the aorta (BP symmetric)  Disposition:  FU with Rily Nickey C. Duke Salvia, MD, [facility] in 3-4 months  Medication Adjustments/Labs and Tests Ordered: Current medicines are reviewed at length with the patient today.  Concerns regarding medicines are outlined above.  No orders of the defined types were placed in this encounter.   No orders of the defined types were placed in this encounter.  I,Mykaella Javier,acting as a scribe for Chilton Si, MD.,have documented all relevant documentation on the behalf of Chilton Si, MD,as directed by  Chilton Si, MD while in the presence of Chilton Si, MD.  I, Natisha Trzcinski C. Duke Salvia, MD have reviewed all documentation for this visit.  The documentation of the exam, diagnosis, procedures, and orders on 12/18/2020 are all accurate and complete.   Signed, Chilton Si, MD  12/18/2020 10:00 AM    [facility]

## 2020-12-18 NOTE — Assessment & Plan Note (Signed)
We will address her lipids once her blood pressure is better controlled.  She has so many medication intolerances that is challenging to start multiple meds at one time.

## 2020-12-19 ENCOUNTER — Ambulatory Visit (INDEPENDENT_AMBULATORY_CARE_PROVIDER_SITE_OTHER): Payer: 59 | Admitting: Obstetrics and Gynecology

## 2020-12-19 ENCOUNTER — Encounter: Payer: Self-pay | Admitting: Obstetrics and Gynecology

## 2020-12-19 VITALS — BP 169/122 | HR 99 | Wt 159.0 lb

## 2020-12-19 DIAGNOSIS — Z01818 Encounter for other preprocedural examination: Secondary | ICD-10-CM

## 2020-12-19 DIAGNOSIS — N812 Incomplete uterovaginal prolapse: Secondary | ICD-10-CM

## 2020-12-19 DIAGNOSIS — N816 Rectocele: Secondary | ICD-10-CM

## 2020-12-19 DIAGNOSIS — N8110 Cystocele, unspecified: Secondary | ICD-10-CM

## 2020-12-19 MED ORDER — ONDANSETRON HCL 4 MG PO TABS
4.0000 mg | ORAL_TABLET | Freq: Three times a day (TID) | ORAL | 0 refills | Status: AC | PRN
Start: 2020-12-19 — End: ?

## 2020-12-19 MED ORDER — ACETAMINOPHEN 500 MG PO TABS: 500.0000 mg | ORAL_TABLET | Freq: Four times a day (QID) | ORAL | 0 refills | Status: DC | PRN

## 2020-12-19 MED ORDER — OXYCODONE HCL 5 MG PO TABS: 5.0000 mg | ORAL_TABLET | ORAL | 0 refills | Status: DC | PRN

## 2020-12-19 MED ORDER — IBUPROFEN 600 MG PO TABS: 600.0000 mg | ORAL_TABLET | Freq: Four times a day (QID) | ORAL | 0 refills | Status: DC | PRN

## 2020-12-19 MED ORDER — POLYETHYLENE GLYCOL 3350 17 GM/SCOOP PO POWD: 17.0000 g | Freq: Every day | ORAL | 0 refills | Status: DC

## 2020-12-20 DIAGNOSIS — N812 Incomplete uterovaginal prolapse: Secondary | ICD-10-CM | POA: Insufficient documentation

## 2020-12-20 NOTE — H&P (Signed)
[facility] Pre-Operative H&P  Subjective Chief Complaint: Patton MEILY GLOWACKI presents for a preoperative encounter.   History of Present Illness: Cristella SHONTELL PROSSER is a 45 y.o. female who presents for preoperative visit.  She is scheduled to undergo robotic assisted total laparoscopic hysterectomy, bilateral salpingectomy, sacrocolpopexy, cystoscopy on 12/31/20.  Her symptoms include recurrent vaginal bulge, and she was was found to have Stage II anterior, Stage II posterior, Stage II apical prolapse . Previously had A/P repair and sling.   Past Medical History:  Diagnosis Date   Abnormal cervical cytology 09/26/2011   LGSIL in 2011, patient reports repeat pap was normal, has had intermittent abnormals with bx in past but always normalized, has had cryotherapy with good results Menarche at 17, irregular without birth control at times, very heavy LMP 08/26/2011 No gyn concerns, MGM last year normal    ADD (attention deficit disorder)    Allergy    seasonal   Anemia    blood loss   Asthma    mild, intermittent   Bronchitis, acute 12/16/2010   Chest pain, atypical 10/08/2010   Chicken pox as a child   Concussion 09/24/2010   Contraceptive management 02/17/2011   Decreased libido 09/26/2011   Depression 2002   post partum   Family history of heart disease    Female bladder prolapse, acquired 09/24/2010   Fibromyalgia    Great toe pain, right 06/02/2017   Headache 09/26/2013   History of concussion 09/24/2010   History of migraines 09/24/2010   HSV-2 infection 09/24/2010   HTN (hypertension) 09/24/2010   Hyperlipidemia    Hyperlipidemia    Hypertension    Hypertriglyceridemia 08/11/2019   Interstitial cystitis 02/18/2011   Left shoulder pain 05/27/2016   Lymphadenopathy 09/24/2010   Migraine 09/24/2010   Migraine aura, persistent, with cerebral infarct, status over 72 hours 09/24/2010   Myalgia 06/08/2018   Neck pain, musculoskeletal 12/16/2010   OSA (obstructive sleep apnea) 08/28/2020    Overweight 05/29/2015   Overweight(278.02) 12/26/2011   Pharyngitis 05/27/2016   Poor concentration 12/16/2010   Preventative health care 11/28/2012   Recurrent UTI    Renal lithiasis 09/24/2010   RUQ pain 05/27/2011   Subacute and chronic vaginitis 09/24/2010   Thyroid disease    Tick bite 12/04/2013   Transient left leg weakness 05/21/2014   Vaginitis and vulvovaginitis 05/21/2014     Past Surgical History:  Procedure Laterality Date   ANTERIOR AND POSTERIOR REPAIR     INCONTINENCE SURGERY  01/13/2001    is allergic to peanut-containing drug products, clindamycin/lincomycin, influenza vaccines, hydralazine, and terazol [terconazole].   Family History  Problem Relation Age of Onset   Osteoporosis Mother    Hyperlipidemia Mother    Kidney disease Father    Heart attack Father    Hyperlipidemia Father    Hypertension Father    Alcohol abuse Father    Heart disease Father    Heart attack Brother 34   Hypertension Brother    Heart disease Brother 75       MI   Hyperlipidemia Brother    Heart disease Maternal Grandmother    Heart failure Maternal Grandmother    Valvular heart disease Maternal Grandmother    Dementia Maternal Grandfather 23       early stages   Stroke Maternal Grandfather    Heart failure Maternal Grandfather    Heart attack Paternal Grandfather    Stroke Paternal Grandfather    Heart disease Paternal Grandfather    Alcohol abuse  Paternal Grandfather    Hyperlipidemia Paternal Grandfather    Hypertension Paternal Grandfather     Social History   Tobacco Use   Smoking status: Never   Smokeless tobacco: Never  Vaping Use   Vaping Use: Never used  Substance Use Topics   Alcohol use: No    Alcohol/week: 0.0 standard drinks    Comment: 0   Drug use: No     Review of Systems was negative for a full 10 system review except as noted in the History of Present Illness.  No current facility-administered medications for this encounter.  Current Outpatient  Medications:    acetaminophen (TYLENOL) 500 MG tablet, Take 1 tablet (500 mg total) by mouth every 6 (six) hours as needed (pain)., Disp: 30 tablet, Rfl: 0   acyclovir (ZOVIRAX) 200 MG capsule, TAKE 1 CAPSULE BY MOUTH TWICE A DAY INS LIMIT OF 30 DAYS, Disp: 60 capsule, Rfl: 11   albuterol (VENTOLIN HFA) 108 (90 Base) MCG/ACT inhaler, INHALE 2 PUFFS INTO THE LUNGS EVERY 4 HOURS AS NEEDED FOR WHEEZE OR FOR SHORTNESS OF BREATH, Disp: 18 each, Rfl: 2   amphetamine-dextroamphetamine (ADDERALL) 20 MG tablet, Take 1 tablet (20 mg total) by mouth 2 (two) times daily., Disp: 60 tablet, Rfl: 0   enalapril (VASOTEC) 10 MG tablet, Take 10 mg (one tablet) in the morning.Take 5 mg (one-half tablet) in the evening., Disp: 120 tablet, Rfl: 3   EPINEPHrine 0.3 mg/0.3 mL IJ SOAJ injection, Inject 0.3 mLs (0.3 mg total) into the muscle as needed for anaphylaxis., Disp: 2 each, Rfl: 2   fluconazole (DIFLUCAN) 150 MG tablet, Take 150 mg by mouth once a week., Disp: , Rfl:    HYDROcodone-acetaminophen (NORCO) 5-325 MG tablet, Take 1 tablet by mouth every 6 (six) hours as needed for moderate pain or severe pain., Disp: 90 tablet, Rfl: 0   ibuprofen (ADVIL) 600 MG tablet, Take 1 tablet (600 mg total) by mouth every 6 (six) hours as needed., Disp: 30 tablet, Rfl: 0   lidocaine (XYLOCAINE) 2 % jelly, APPLY TO AFFECTED AREA DAILY AS NEEDED, Disp: 20 mL, Rfl: 2   liothyronine (CYTOMEL) 5 MCG tablet, TAKE 1 TABLET BY MOUTH EVERY DAY, Disp: 90 tablet, Rfl: 1   neomycin-polymyxin b-dexamethasone (MAXITROL) 3.5-10000-0.1 SUSP, Place 1 drop into both eyes in the morning, at noon, in the evening, and at bedtime., Disp: 5 mL, Rfl: 1   nitrofurantoin, macrocrystal-monohydrate, (MACROBID) 100 MG capsule, TAKE 1 CAPSULE FOR 1 DOSE AS NEEDED FOR PREVENTION OF UTI *AFTER SELF CATH, SWIMMING, HOT TUB, SEX*, Disp: 30 capsule, Rfl: 8   Norethindrone Acetate-Ethinyl Estrad-FE (BLISOVI 24 FE) 1-20 MG-MCG(24) tablet, Take 1 tablet by mouth daily.,  Disp: 28 tablet, Rfl: 11   ondansetron (ZOFRAN) 4 MG tablet, Take 1 tablet (4 mg total) by mouth every 8 (eight) hours as needed for nausea or vomiting., Disp: 10 tablet, Rfl: 0   oxyCODONE (OXY IR/ROXICODONE) 5 MG immediate release tablet, Take 1 tablet (5 mg total) by mouth every 4 (four) hours as needed for severe pain., Disp: 15 tablet, Rfl: 0   polyethylene glycol powder (GLYCOLAX/MIRALAX) 17 GM/SCOOP powder, Take 17 g by mouth daily. Drink 17g (1 scoop) dissolved in water per day., Disp: 255 g, Rfl: 0   SYMBICORT 160-4.5 MCG/ACT inhaler, TAKE 2 PUFFS BY MOUTH TWICE A DAY, Disp: 10.2 Inhaler, Rfl: 5   SYNTHROID 137 MCG tablet, TAKE 1 TABLET BY MOUTH EVERY DAY, Disp: 90 tablet, Rfl: 1   Objective There were no  vitals filed for this visit.   Gen: NAD CV: S1 S2 RRR Lungs: Clear to auscultation bilaterally Abd: soft, nontender   Previous Pelvic Exam showed: POP-Q (11/02/20):    POP-Q   -1                                            Aa   -1                                           Ba   -1                                              C    4                                            Gh   5                                            Pb   7                                            tvl    -1                                            Ap   -1                                            Bp   -5.5                                              D         Assessment/ Plan  Assessment: The patient is a 45 y.o. year old with stage II pelvic organ prolapse scheduled to undergo robotic assisted total laparoscopic hysterectomy, bilateral salpingectomy, sacrocolpopexy, cystoscopy.   Marguerita Beards, MD

## 2020-12-27 ENCOUNTER — Other Ambulatory Visit: Payer: Self-pay

## 2020-12-27 ENCOUNTER — Encounter (HOSPITAL_COMMUNITY)
Admission: RE | Admit: 2020-12-27 | Discharge: 2020-12-27 | Disposition: A | Discharge status: Home / Self Care | Payer: 59 | Source: Ambulatory Visit | Attending: Obstetrics and Gynecology | Admitting: Obstetrics and Gynecology

## 2020-12-27 ENCOUNTER — Encounter (HOSPITAL_BASED_OUTPATIENT_CLINIC_OR_DEPARTMENT_OTHER): Payer: Self-pay | Admitting: Obstetrics and Gynecology

## 2020-12-27 DIAGNOSIS — A6000 Herpesviral infection of urogenital system, unspecified: Secondary | ICD-10-CM

## 2020-12-27 DIAGNOSIS — Z01812 Encounter for preprocedural laboratory examination: Secondary | ICD-10-CM | POA: Diagnosis present

## 2020-12-27 DIAGNOSIS — D649 Anemia, unspecified: Secondary | ICD-10-CM | POA: Insufficient documentation

## 2020-12-27 DIAGNOSIS — F40240 Claustrophobia: Secondary | ICD-10-CM

## 2020-12-27 DIAGNOSIS — I10 Essential (primary) hypertension: Secondary | ICD-10-CM | POA: Diagnosis not present

## 2020-12-27 DIAGNOSIS — Z789 Other specified health status: Secondary | ICD-10-CM

## 2020-12-27 HISTORY — DX: Claustrophobia: F40.240

## 2020-12-27 HISTORY — DX: Herpesviral infection of urogenital system, unspecified: A60.00

## 2020-12-27 HISTORY — DX: Other specified health status: Z78.9

## 2020-12-27 LAB — CBC
HCT: 43.1 % (ref 36.0–46.0)
Hemoglobin: 14.7 g/dL (ref 12.0–15.0)
MCH: 30.4 pg (ref 26.0–34.0)
MCHC: 34.1 g/dL (ref 30.0–36.0)
MCV: 89.2 fL (ref 80.0–100.0)
Platelets: 235 10*3/uL (ref 150–400)
RBC: 4.83 MIL/uL (ref 3.87–5.11)
RDW: 12.5 % (ref 11.5–15.5)
WBC: 9.5 10*3/uL (ref 4.0–10.5)
nRBC: 0 % (ref 0.0–0.2)

## 2020-12-27 LAB — BASIC METABOLIC PANEL
Anion gap: 8 (ref 5–15)
BUN: 14 mg/dL (ref 6–20)
CO2: 23 mmol/L (ref 22–32)
Calcium: 8.9 mg/dL (ref 8.9–10.3)
Chloride: 103 mmol/L (ref 98–111)
Creatinine, Ser: 0.7 mg/dL (ref 0.44–1.00)
GFR, Estimated: >60 mL/min (ref >60)
Glucose, Bld: 130 mg/dL — ABNORMAL HIGH (ref 70–99)
Potassium: 3.8 mmol/L (ref 3.5–5.1)
Sodium: 134 mmol/L — ABNORMAL LOW (ref 135–145)

## 2020-12-27 NOTE — Progress Notes (Addendum)
Spoke w/ via phone for pre-op interview---pt Lab needs dos----     urine preg          Lab results------lab appt 12-27-2020 [facility] bmp t & s COVID test -----patient states asymptomatic no test needed Arrive at -------730 am 12-31-2020 NPO after MN NO Solid Food.  Clear liquids from MN until---630 am Med rec completed Medications to take morning of surgery -----use bring albuterol inaler, use bring symbicort, use bring eye drops, synthroid, liothyronine (cytomel) Diabetic medication -----n/a Patient instructed no nail polish to be worn day of surgery Patient instructed to bring photo id and insurance card day of surgery Patient aware to have Driver (ride ) / caregiver    for 24 hours after surgery husband Kristen [name] Patient Special Instructions -----pt given extended stay instructions Pre-Op special Istructions -----bring cpap mask tubing and machine and leave in car Patient verbalized understanding of instructions that were given at this phone interview. Patient denies  cardiac s & s, shortness of breath, chest pain, fever, cough at this phone interview.   Lov dr Tresa Endo osa 11-20-2020 epic [facility] cardiology dr Elmarie Shiley Bland 12-18-2020 epic Ekg 11-20-2020 chart/epic Ct cardiac score 08-30-2019 epic Sleep study 09-07-2019 epic Stress test 11-10-2013 epic

## 2020-12-27 NOTE — Progress Notes (Signed)
PLEASE WEAR A MASK OUT IN PUBLIC AND SOCIAL DISTANCE AND WASH YOUR HANDS FREQUENTLY. PLEASE ASK ALL YOUR CLOSE HOUSEHOLD CONTACT TO WEAR MASK OUT IN PUBLIC AND SOCIAL DISTANCE AND WASH HANDS FREQUENTLY ALSO.      Your procedure is scheduled on 12-31-2020  Report to [facility] [place] AT  730 A. M.   Call this number if you have problems the morning of surgery  :[phone].   OUR ADDRESS IS [address].  WE ARE LOCATED IN [facility].  PLEASE BRING YOUR INSURANCE CARD AND PHOTO ID DAY OF SURGERY.  ONLY ONE PERSON ALLOWED IN FACILITY WAITING AREA.                                     REMEMBER:  DO NOT EAT FOOD, CANDY GUM OR MINTS  AFTER MIDNIGHT THE NIGHT BEFORE YOUR SURGERY . YOU MAY HAVE CLEAR LIQUIDS FROM MIDNIGHT THE NIGHT BEFORE YOUR SURGERY UNTIL 630 AM. NO CLEAR LIQUIDS AFTER 630 AM DAY OF SURGERY.   YOU MAY  BRUSH YOUR TEETH MORNING OF SURGERY AND RINSE YOUR MOUTH OUT, NO CHEWING GUM CANDY OR MINTS.    CLEAR LIQUID DIET   Foods Allowed                                                                     Foods Excluded  Coffee and tea, regular and decaf                             liquids that you cannot  Plain Jell-O any favor except red or purple                                           see through such as: Fruit ices (not with fruit pulp)                                     milk, soups, orange juice  Iced Popsicles                                    All solid food Carbonated beverages, regular and diet                                    Cranberry, grape and apple juices Sports drinks like Gatorade Lightly seasoned clear broth or consume(fat free) Sugar  Sample Menu Breakfast                                Lunch  Supper Cranberry juice                                           Jell-O                                     Grape juice                           Apple juice Coffee or tea                         Jell-O                                      Popsicle                                                Coffee or tea                        Coffee or tea  _____________________________________________________________________     TAKE THESE MEDICATIONS MORNING OF SURGERY WITH A SIP OF WATER: USE/BRING ALBUTEROL INHALER IF NEEDED, USE/BRING SYMBICORT, USE/BRING EYE DROPS, SYNTHROID, LIOTHYRONINE (CYTOMEL).  REMEMBER: DO NOT TAKE YOUR ENALAPRIL BLOOD PRESSURE MEDICATION MORNING OF SURGERY,  IT WILL BE GIVEN AFTER SURGERY.  BRING CPAP MASK TUBING AND MACHINE AND LEAVE IN CAR.  ONE VISITOR IS ALLOWED IN WAITING ROOM ONLY DAY OF SURGERY.  YOU MAY HAVE ANOTHER PERSON SWITCH OUT WITH THE  1  VISITOR IN THE WAITING ROOM DAY OF SURGERY AND A MASK MUST BE WORN IN THE WAITING ROOM.    2 VISITORS  MAY VISIT IN THE EXTENDED RECOVERY ROOM UNTIL 800 PM ONLY 1 VISITOR AGE 28 AND OVER MAY SPEND THE NIGHT AND MUST BE IN EXTENDED RECOVERY ROOM NO LATER THAN 800 PM .    UP TO 2 CHILDREN AGE 45 TO 15 MAY ALSO VISIT IN EXTENDED RECOVERY ROOM ONLY UNTIL 800 PM AND MUST LEAVE BY 800 PM. ALL PERSONS VISITING IN EXTENDED RECOVERY ROOM MUST WEAR A MASK.                                    DO NOT WEAR JEWERLY, MAKE UP. DO NOT WEAR LOTIONS, POWDERS, PERFUMES OR NAIL POLISH ON YOUR FINGERNAILS. TOENAIL POLISH IS OK TO WEAR. DO NOT SHAVE FOR 48 HOURS PRIOR TO DAY OF SURGERY. MEN MAY SHAVE FACE AND NECK. CONTACTS, GLASSES, OR DENTURES MAY NOT BE WORN TO SURGERY.                                    [facility] IS NOT RESPONSIBLE  FOR ANY BELONGINGS.                                                                    [facility]  [place] - Preparing for Surgery Before surgery, you can play an important role.  Because skin is not sterile, your skin needs to be as free of germs as possible.  You can reduce the number of germs on your skin by washing with CHG (chlorahexidine gluconate) soap before surgery.  CHG is an  antiseptic cleaner which kills germs and bonds with the skin to continue killing germs even after washing. Please DO NOT use if you have an allergy to CHG or antibacterial soaps.  If your skin becomes reddened/irritated stop using the CHG and inform your nurse when you arrive at Short Stay. Do not shave (including legs and underarms) for at least 48 hours prior to the first CHG shower.  You may shave your face/neck. Please follow these instructions carefully:  1.  Shower with CHG Soap the night before surgery and the  morning of Surgery.  2.  If you choose to wash your hair, wash your hair first as usual with your  normal  shampoo.  3.  After you shampoo, rinse your hair and body thoroughly to remove the  shampoo.                            4.  Use CHG as you would any other liquid soap.  You can apply chg directly  to the skin and wash                      Gently with a scrungie or clean washcloth.  5.  Apply the CHG Soap to your body ONLY FROM THE NECK DOWN.   Do not use on face/ open                           Wound or open sores. Avoid contact with eyes, ears mouth and genitals (private parts).                       Wash face,  Genitals (private parts) with your normal soap.             6.  Wash thoroughly, paying special attention to the area where your surgery  will be performed.  7.  Thoroughly rinse your body with warm water from the neck down.  8.  DO NOT shower/wash with your normal soap after using and rinsing off  the CHG Soap.                9.  Pat yourself dry with a clean towel.            10.  Wear clean pajamas.            11.  Place clean sheets on your bed the night of your first shower and do not  sleep with pets. Day of Surgery : Do not apply any lotions/deodorants the morning of surgery.  Please wear clean clothes to the hospital/surgery center.  IF YOU HAVE ANY SKIN IRRITATION OR PROBLEMS WITH THE SURGICAL SOAP, PLEASE GET A BAR OF GOLD DIAL SOAP AND SHOWER THE NIGHT  BEFORE YOUR SURGERY AND THE MORNING OF YOUR SURGERY. PLEASE LET THE NURSE KNOW MORNING OF YOUR SURGERY IF YOU HAD ANY PROBLEMS WITH THE SURGICAL SOAP.  FAILURE TO FOLLOW THESE INSTRUCTIONS MAY RESULT IN THE CANCELLATION OF YOUR SURGERY PATIENT SIGNATURE_________________________________  NURSE SIGNATURE__________________________________  ________________________________________________________________________  QUESTIONS CALL [facility] PRE OP NURSE PHONE [phone].

## 2020-12-31 ENCOUNTER — Ambulatory Visit (HOSPITAL_BASED_OUTPATIENT_CLINIC_OR_DEPARTMENT_OTHER): Payer: 59 | Admitting: Anesthesiology

## 2020-12-31 ENCOUNTER — Other Ambulatory Visit: Payer: Self-pay

## 2020-12-31 ENCOUNTER — Encounter (HOSPITAL_BASED_OUTPATIENT_CLINIC_OR_DEPARTMENT_OTHER)
Admission: RE | Disposition: A | Discharge status: Home / Self Care | Payer: Self-pay | Source: Home / Self Care | Attending: Obstetrics and Gynecology

## 2020-12-31 ENCOUNTER — Ambulatory Visit (HOSPITAL_BASED_OUTPATIENT_CLINIC_OR_DEPARTMENT_OTHER)
Admission: RE | Admit: 2020-12-31 | Discharge: 2020-12-31 | Disposition: A | Discharge status: Home / Self Care | Payer: 59 | Attending: Obstetrics and Gynecology | Admitting: Obstetrics and Gynecology

## 2020-12-31 ENCOUNTER — Encounter (HOSPITAL_BASED_OUTPATIENT_CLINIC_OR_DEPARTMENT_OTHER): Payer: Self-pay | Admitting: Obstetrics and Gynecology

## 2020-12-31 DIAGNOSIS — G4730 Sleep apnea, unspecified: Secondary | ICD-10-CM | POA: Diagnosis not present

## 2020-12-31 DIAGNOSIS — N888 Other specified noninflammatory disorders of cervix uteri: Secondary | ICD-10-CM | POA: Diagnosis not present

## 2020-12-31 DIAGNOSIS — Z8673 Personal history of transient ischemic attack (TIA), and cerebral infarction without residual deficits: Secondary | ICD-10-CM | POA: Insufficient documentation

## 2020-12-31 DIAGNOSIS — N319 Neuromuscular dysfunction of bladder, unspecified: Secondary | ICD-10-CM | POA: Insufficient documentation

## 2020-12-31 DIAGNOSIS — N812 Incomplete uterovaginal prolapse: Secondary | ICD-10-CM | POA: Diagnosis present

## 2020-12-31 DIAGNOSIS — M797 Fibromyalgia: Secondary | ICD-10-CM | POA: Diagnosis not present

## 2020-12-31 DIAGNOSIS — E039 Hypothyroidism, unspecified: Secondary | ICD-10-CM | POA: Insufficient documentation

## 2020-12-31 DIAGNOSIS — F32A Depression, unspecified: Secondary | ICD-10-CM | POA: Diagnosis not present

## 2020-12-31 DIAGNOSIS — M1990 Unspecified osteoarthritis, unspecified site: Secondary | ICD-10-CM | POA: Diagnosis not present

## 2020-12-31 DIAGNOSIS — Z79891 Long term (current) use of opiate analgesic: Secondary | ICD-10-CM | POA: Insufficient documentation

## 2020-12-31 DIAGNOSIS — F909 Attention-deficit hyperactivity disorder, unspecified type: Secondary | ICD-10-CM | POA: Insufficient documentation

## 2020-12-31 DIAGNOSIS — R519 Headache, unspecified: Secondary | ICD-10-CM | POA: Insufficient documentation

## 2020-12-31 DIAGNOSIS — F419 Anxiety disorder, unspecified: Secondary | ICD-10-CM | POA: Diagnosis not present

## 2020-12-31 DIAGNOSIS — J45909 Unspecified asthma, uncomplicated: Secondary | ICD-10-CM | POA: Diagnosis not present

## 2020-12-31 DIAGNOSIS — I10 Essential (primary) hypertension: Secondary | ICD-10-CM

## 2020-12-31 DIAGNOSIS — D649 Anemia, unspecified: Secondary | ICD-10-CM

## 2020-12-31 HISTORY — PX: ABDOMINAL SACROCOLPOPEXY: SHX1114

## 2020-12-31 HISTORY — DX: Unspecified osteoarthritis, unspecified site: M19.90

## 2020-12-31 HISTORY — DX: Hypothyroidism, unspecified: E03.9

## 2020-12-31 HISTORY — DX: Personal history of urinary calculi: Z87.442

## 2020-12-31 HISTORY — DX: Unspecified urinary incontinence: R32

## 2020-12-31 HISTORY — DX: Presence of spectacles and contact lenses: Z97.3

## 2020-12-31 HISTORY — PX: CYSTOSCOPY: SHX5120

## 2020-12-31 HISTORY — DX: Pneumonia, unspecified organism: J18.9

## 2020-12-31 HISTORY — PX: ROBOTIC ASSISTED TOTAL HYSTERECTOMY: SHX6085

## 2020-12-31 LAB — TYPE AND SCREEN
ABO/RH(D): A POS
Antibody Screen: NEGATIVE

## 2020-12-31 LAB — POCT PREGNANCY, URINE: Preg Test, Ur: NEGATIVE

## 2020-12-31 SURGERY — HYSTERECTOMY, TOTAL, ROBOT-ASSISTED
Anesthesia: General | Site: Urethra

## 2020-12-31 MED ORDER — SIMETHICONE 80 MG PO CHEW: 80.0000 mg | CHEWABLE_TABLET | Freq: Four times a day (QID) | ORAL | Status: DC | PRN

## 2020-12-31 MED ORDER — PHENYLEPHRINE 40 MCG/ML (10ML) SYRINGE FOR IV PUSH (FOR BLOOD PRESSURE SUPPORT)
PREFILLED_SYRINGE | INTRAVENOUS | Status: AC
  Filled 2020-12-31: qty 10

## 2020-12-31 MED ORDER — BUPIVACAINE HCL (PF) 0.25 % IJ SOLN
INTRAMUSCULAR | Status: DC | PRN
  Administered 2020-12-31: 11 mL

## 2020-12-31 MED ORDER — DEXMEDETOMIDINE (PRECEDEX) IN NS 20 MCG/5ML (4 MCG/ML) IV SYRINGE
PREFILLED_SYRINGE | INTRAVENOUS | Status: DC | PRN
  Administered 2020-12-31: 8 ug via INTRAVENOUS

## 2020-12-31 MED ORDER — LIDOCAINE 2% (20 MG/ML) 5 ML SYRINGE
INTRAMUSCULAR | Status: DC | PRN
  Administered 2020-12-31: 80 mg via INTRAVENOUS

## 2020-12-31 MED ORDER — ACETAMINOPHEN 325 MG PO TABS: 650.0000 mg | ORAL_TABLET | ORAL | Status: DC | PRN

## 2020-12-31 MED ORDER — FENTANYL CITRATE (PF) 100 MCG/2ML IJ SOLN
INTRAMUSCULAR | Status: AC
  Filled 2020-12-31: qty 2

## 2020-12-31 MED ORDER — PHENAZOPYRIDINE HCL 100 MG PO TABS
ORAL_TABLET | ORAL | Status: AC
  Filled 2020-12-31: qty 2

## 2020-12-31 MED ORDER — MIDAZOLAM HCL 2 MG/2ML IJ SOLN
INTRAMUSCULAR | Status: AC
  Filled 2020-12-31: qty 2

## 2020-12-31 MED ORDER — LABETALOL HCL 5 MG/ML IV SOLN
INTRAVENOUS | Status: DC | PRN
  Administered 2020-12-31: 10 mg via INTRAVENOUS

## 2020-12-31 MED ORDER — ROCURONIUM BROMIDE 10 MG/ML (PF) SYRINGE
PREFILLED_SYRINGE | INTRAVENOUS | Status: AC
  Filled 2020-12-31: qty 10

## 2020-12-31 MED ORDER — DEXAMETHASONE SODIUM PHOSPHATE 10 MG/ML IJ SOLN
INTRAMUSCULAR | Status: AC
  Filled 2020-12-31: qty 1

## 2020-12-31 MED ORDER — DEXAMETHASONE SODIUM PHOSPHATE 10 MG/ML IJ SOLN
INTRAMUSCULAR | Status: DC | PRN
Start: 2020-12-31 — End: 2020-12-31
  Administered 2020-12-31: 10 mg via INTRAVENOUS

## 2020-12-31 MED ORDER — GABAPENTIN 300 MG PO CAPS
ORAL_CAPSULE | ORAL | Status: AC
  Filled 2020-12-31: qty 1

## 2020-12-31 MED ORDER — SCOPOLAMINE 1 MG/3DAYS TD PT72
1.0000 | MEDICATED_PATCH | Freq: Once | TRANSDERMAL | Status: DC
  Administered 2020-12-31: 09:00:00 1.5 mg via TRANSDERMAL

## 2020-12-31 MED ORDER — CELECOXIB 200 MG PO CAPS
400.0000 mg | ORAL_CAPSULE | ORAL | Status: AC
  Administered 2020-12-31: 08:00:00 400 mg via ORAL

## 2020-12-31 MED ORDER — ROCURONIUM BROMIDE 10 MG/ML (PF) SYRINGE
PREFILLED_SYRINGE | INTRAVENOUS | Status: DC | PRN
Start: 2020-12-31 — End: 2020-12-31
  Administered 2020-12-31: 20 mg via INTRAVENOUS
  Administered 2020-12-31: 60 mg via INTRAVENOUS
  Administered 2020-12-31: 20 mg via INTRAVENOUS

## 2020-12-31 MED ORDER — PROPOFOL 10 MG/ML IV BOLUS
INTRAVENOUS | Status: AC
  Filled 2020-12-31: qty 20

## 2020-12-31 MED ORDER — LACTATED RINGERS IV SOLN: INTRAVENOUS | Status: DC

## 2020-12-31 MED ORDER — FENTANYL CITRATE (PF) 250 MCG/5ML IJ SOLN
INTRAMUSCULAR | Status: AC
  Filled 2020-12-31: qty 5

## 2020-12-31 MED ORDER — CEFAZOLIN SODIUM-DEXTROSE 2-4 GM/100ML-% IV SOLN
2.0000 g | INTRAVENOUS | Status: AC
  Administered 2020-12-31: 09:00:00 2 g via INTRAVENOUS

## 2020-12-31 MED ORDER — OXYCODONE HCL 5 MG PO TABS
ORAL_TABLET | ORAL | Status: AC
  Filled 2020-12-31: qty 2

## 2020-12-31 MED ORDER — LABETALOL HCL 5 MG/ML IV SOLN
INTRAVENOUS | Status: AC
  Filled 2020-12-31: qty 4

## 2020-12-31 MED ORDER — KETOROLAC TROMETHAMINE 30 MG/ML IJ SOLN
INTRAMUSCULAR | Status: AC
  Filled 2020-12-31: qty 1

## 2020-12-31 MED ORDER — PROMETHAZINE HCL 25 MG/ML IJ SOLN
6.2500 mg | INTRAMUSCULAR | Status: DC | PRN
Start: 2020-12-31 — End: 2021-01-01

## 2020-12-31 MED ORDER — ACETAMINOPHEN 500 MG PO TABS
1000.0000 mg | ORAL_TABLET | ORAL | Status: AC
  Administered 2020-12-31: 08:00:00 1000 mg via ORAL

## 2020-12-31 MED ORDER — PHENYLEPHRINE 40 MCG/ML (10ML) SYRINGE FOR IV PUSH (FOR BLOOD PRESSURE SUPPORT)
PREFILLED_SYRINGE | INTRAVENOUS | Status: DC | PRN
Start: 2020-12-31 — End: 2020-12-31
  Administered 2020-12-31 (×2): 80 ug via INTRAVENOUS
  Administered 2020-12-31 (×2): 40 ug via INTRAVENOUS

## 2020-12-31 MED ORDER — CELECOXIB 200 MG PO CAPS
ORAL_CAPSULE | ORAL | Status: AC
  Filled 2020-12-31: qty 2

## 2020-12-31 MED ORDER — SUGAMMADEX SODIUM 200 MG/2ML IV SOLN
INTRAVENOUS | Status: DC | PRN
Start: 2020-12-31 — End: 2020-12-31
  Administered 2020-12-31: 200 mg via INTRAVENOUS

## 2020-12-31 MED ORDER — HEMOSTATIC AGENTS (NO CHARGE) OPTIME
TOPICAL | Status: DC | PRN
  Administered 2020-12-31 (×2): 1 via TOPICAL

## 2020-12-31 MED ORDER — MIDAZOLAM HCL 5 MG/5ML IJ SOLN
INTRAMUSCULAR | Status: DC | PRN
Start: 2020-12-31 — End: 2020-12-31
  Administered 2020-12-31: 2 mg via INTRAVENOUS

## 2020-12-31 MED ORDER — ONDANSETRON HCL 4 MG/2ML IJ SOLN
INTRAMUSCULAR | Status: DC | PRN
  Administered 2020-12-31: 4 mg via INTRAVENOUS

## 2020-12-31 MED ORDER — KETOROLAC TROMETHAMINE 30 MG/ML IJ SOLN
30.0000 mg | Freq: Four times a day (QID) | INTRAMUSCULAR | Status: DC
  Administered 2020-12-31: 14:00:00 30 mg via INTRAVENOUS

## 2020-12-31 MED ORDER — SODIUM CHLORIDE 0.9 % IR SOLN
Status: DC | PRN
  Administered 2020-12-31: 1000 mL

## 2020-12-31 MED ORDER — ONDANSETRON HCL 4 MG PO TABS: 4.0000 mg | ORAL_TABLET | Freq: Four times a day (QID) | ORAL | Status: DC | PRN

## 2020-12-31 MED ORDER — SCOPOLAMINE 1 MG/3DAYS TD PT72
MEDICATED_PATCH | TRANSDERMAL | Status: AC
  Filled 2020-12-31: qty 1

## 2020-12-31 MED ORDER — PROPOFOL 10 MG/ML IV BOLUS
INTRAVENOUS | Status: DC | PRN
  Administered 2020-12-31: 160 mg via INTRAVENOUS

## 2020-12-31 MED ORDER — ONDANSETRON HCL 4 MG/2ML IJ SOLN: 4.0000 mg | Freq: Four times a day (QID) | INTRAMUSCULAR | Status: DC | PRN

## 2020-12-31 MED ORDER — ACETAMINOPHEN 500 MG PO TABS
ORAL_TABLET | ORAL | Status: AC
  Filled 2020-12-31: qty 2

## 2020-12-31 MED ORDER — DEXMEDETOMIDINE (PRECEDEX) IN NS 20 MCG/5ML (4 MCG/ML) IV SYRINGE
PREFILLED_SYRINGE | INTRAVENOUS | Status: AC
  Filled 2020-12-31: qty 15

## 2020-12-31 MED ORDER — IBUPROFEN 200 MG PO TABS: 600.0000 mg | ORAL_TABLET | Freq: Four times a day (QID) | ORAL | Status: DC

## 2020-12-31 MED ORDER — WATER FOR IRRIGATION, STERILE IR SOLN
Status: DC | PRN
  Administered 2020-12-31: 500 mL

## 2020-12-31 MED ORDER — GABAPENTIN 300 MG PO CAPS
300.0000 mg | ORAL_CAPSULE | ORAL | Status: AC
  Administered 2020-12-31: 08:00:00 300 mg via ORAL

## 2020-12-31 MED ORDER — POVIDONE-IODINE 10 % EX SWAB: 2.0000 "application " | Freq: Once | CUTANEOUS | Status: DC

## 2020-12-31 MED ORDER — FENTANYL CITRATE (PF) 100 MCG/2ML IJ SOLN
25.0000 ug | INTRAMUSCULAR | Status: DC | PRN
Start: 2020-12-31 — End: 2021-01-01
  Administered 2020-12-31 (×2): 25 ug via INTRAVENOUS
  Administered 2020-12-31: 13:00:00 50 ug via INTRAVENOUS
  Administered 2020-12-31: 13:00:00 25 ug via INTRAVENOUS

## 2020-12-31 MED ORDER — PHENAZOPYRIDINE HCL 100 MG PO TABS
200.0000 mg | ORAL_TABLET | ORAL | Status: AC
  Administered 2020-12-31: 08:00:00 200 mg via ORAL

## 2020-12-31 MED ORDER — ONDANSETRON HCL 4 MG/2ML IJ SOLN
INTRAMUSCULAR | Status: AC
  Filled 2020-12-31: qty 2

## 2020-12-31 MED ORDER — CEFAZOLIN SODIUM-DEXTROSE 2-4 GM/100ML-% IV SOLN
INTRAVENOUS | Status: AC
  Filled 2020-12-31: qty 100

## 2020-12-31 MED ORDER — OXYCODONE HCL 5 MG PO TABS
5.0000 mg | ORAL_TABLET | ORAL | Status: DC | PRN
  Administered 2020-12-31 (×2): 10 mg via ORAL

## 2020-12-31 MED ORDER — FENTANYL CITRATE (PF) 100 MCG/2ML IJ SOLN
INTRAMUSCULAR | Status: DC | PRN
  Administered 2020-12-31: 25 ug via INTRAVENOUS
  Administered 2020-12-31: 100 ug via INTRAVENOUS
  Administered 2020-12-31: 50 ug via INTRAVENOUS
  Administered 2020-12-31: 25 ug via INTRAVENOUS

## 2020-12-31 MED ORDER — LIDOCAINE 2% (20 MG/ML) 5 ML SYRINGE
INTRAMUSCULAR | Status: AC
  Filled 2020-12-31: qty 5

## 2020-12-31 SURGICAL SUPPLY — 78 items
ADH SKN CLS APL DERMABOND .7 (GAUZE/BANDAGES/DRESSINGS) ×2
APL PRP STRL LF DISP 70% ISPRP (MISCELLANEOUS) ×2
APL SRG 38 LTWT LNG FL B (MISCELLANEOUS) ×2
APPLICATOR ARISTA FLEXITIP XL (MISCELLANEOUS) ×2 IMPLANT
CATH SILICONE 16FRX5CC (CATHETERS) ×2 IMPLANT
CHLORAPREP W/TINT 26 (MISCELLANEOUS) ×4 IMPLANT
COVER BACK TABLE 60X90IN (DRAPES) ×4 IMPLANT
COVER TIP SHEARS 8 DVNC (MISCELLANEOUS) ×2 IMPLANT
COVER TIP SHEARS 8MM DA VINCI (MISCELLANEOUS) ×4
DECANTER SPIKE VIAL GLASS SM (MISCELLANEOUS) ×6 IMPLANT
DEFOGGER SCOPE WARMER CLEARIFY (MISCELLANEOUS) ×4 IMPLANT
DERMABOND ADVANCED (GAUZE/BANDAGES/DRESSINGS) ×2
DERMABOND ADVANCED .7 DNX12 (GAUZE/BANDAGES/DRESSINGS) ×2 IMPLANT
DRAPE ARM DVNC X/XI (DISPOSABLE) ×8 IMPLANT
DRAPE COLUMN DVNC XI (DISPOSABLE) ×2 IMPLANT
DRAPE DA VINCI XI ARM (DISPOSABLE) ×16
DRAPE DA VINCI XI COLUMN (DISPOSABLE) ×4
DRAPE SHEET LG 3/4 BI-LAMINATE (DRAPES) ×2 IMPLANT
DRAPE UTILITY XL STRL (DRAPES) ×4 IMPLANT
DURAPREP 26ML APPLICATOR (WOUND CARE) ×4 IMPLANT
ELECT REM PT RETURN 9FT ADLT (ELECTROSURGICAL) ×4
ELECTRODE REM PT RTRN 9FT ADLT (ELECTROSURGICAL) ×2 IMPLANT
GAUZE 4X4 16PLY ~~LOC~~+RFID DBL (SPONGE) ×8 IMPLANT
GLOVE SURG POLYISO LF SZ6 (GLOVE) ×8 IMPLANT
GLOVE SURG UNDER POLY LF SZ6.5 (GLOVE) ×20 IMPLANT
GLOVE SURG UNDER POLY LF SZ7 (GLOVE) ×16 IMPLANT
GOWN STRL REUS W/TWL LRG LVL3 (GOWN DISPOSABLE) ×4 IMPLANT
HEMOSTAT ARISTA ABSORB 3G PWDR (HEMOSTASIS) ×2 IMPLANT
HIBICLENS CHG 4% 4OZ BTL (MISCELLANEOUS) ×4 IMPLANT
HOLDER FOLEY CATH W/STRAP (MISCELLANEOUS) ×4 IMPLANT
IRRIG SUCT STRYKERFLOW 2 WTIP (MISCELLANEOUS) ×4
IRRIGATION SUCT STRKRFLW 2 WTP (MISCELLANEOUS) ×2 IMPLANT
IV NS 1000ML (IV SOLUTION) ×8
IV NS 1000ML BAXH (IV SOLUTION) IMPLANT
KIT TURNOVER CYSTO (KITS) ×4 IMPLANT
LEGGING LITHOTOMY PAIR STRL (DRAPES) ×4 IMPLANT
MANIFOLD NEPTUNE II (INSTRUMENTS) ×4 IMPLANT
MANIPULATOR ADVINCU DEL 2.5 PL (MISCELLANEOUS) ×2 IMPLANT
MANIPULATOR ADVINCU DEL 3.0 PL (MISCELLANEOUS) IMPLANT
MANIPULATOR ADVINCU DEL 3.5 PL (MISCELLANEOUS) IMPLANT
MANIPULATOR ADVINCU DEL 4.0 PL (MISCELLANEOUS) IMPLANT
MESH VERTESSA LITE -Y 2X4X3 (Mesh General) ×2 IMPLANT
NEEDLE INSUFFLATION 120MM (ENDOMECHANICALS) ×4 IMPLANT
OBTURATOR OPTICAL STANDARD 8MM (TROCAR) ×4
OBTURATOR OPTICAL STND 8 DVNC (TROCAR) ×2
OBTURATOR OPTICALSTD 8 DVNC (TROCAR) ×2 IMPLANT
PACK CYSTO (CUSTOM PROCEDURE TRAY) ×4 IMPLANT
PACK ROBOT WH (CUSTOM PROCEDURE TRAY) ×4 IMPLANT
PACK ROBOTIC GOWN (GOWN DISPOSABLE) ×4 IMPLANT
PACK TRENDGUARD 450 HYBRID PRO (MISCELLANEOUS) IMPLANT
PAD OB MATERNITY 4.3X12.25 (PERSONAL CARE ITEMS) ×4 IMPLANT
PAD POSITIONING PINK XL (MISCELLANEOUS) ×4 IMPLANT
PAD PREP 24X48 CUFFED NSTRL (MISCELLANEOUS) ×4 IMPLANT
POUCH LAPAROSCOPIC INSTRUMENT (MISCELLANEOUS) IMPLANT
PROTECTOR NERVE ULNAR (MISCELLANEOUS) ×8 IMPLANT
SEAL CANN UNIV 5-8 DVNC XI (MISCELLANEOUS) ×8 IMPLANT
SEAL XI 5MM-8MM UNIVERSAL (MISCELLANEOUS) ×20
SET IRRIG Y TYPE TUR BLADDER L (SET/KITS/TRAYS/PACK) ×4 IMPLANT
SET TUBE SMOKE EVAC HIGH FLOW (TUBING) ×4 IMPLANT
SPONGE T-LAP 4X18 ~~LOC~~+RFID (SPONGE) ×4 IMPLANT
SUT ABS MONO DBL WITH NDL 48IN (SUTURE) IMPLANT
SUT CV-0 GORETEX TFX25 36 (SUTURE) IMPLANT
SUT GORETEX NAB #0 THX26 36IN (SUTURE) ×2 IMPLANT
SUT MNCRL AB 4-0 PS2 18 (SUTURE) ×4 IMPLANT
SUT MON AB 2-0 SH 27 (SUTURE) ×6 IMPLANT
SUT VIC AB 2-0 SH 27 (SUTURE) ×4
SUT VIC AB 2-0 SH 27XBRD (SUTURE) ×2 IMPLANT
SUT VICRYL 0 UR6 27IN ABS (SUTURE) ×2 IMPLANT
SUT VICRYL 2-0 SH 8X27 (SUTURE) IMPLANT
SUT VLOC 180 0 9IN  GS21 (SUTURE)
SUT VLOC 180 0 9IN GS21 (SUTURE) IMPLANT
SUT VLOC 180 2-0 9IN GS21 (SUTURE) ×8 IMPLANT
TOWEL OR 17X26 10 PK STRL BLUE (TOWEL DISPOSABLE) ×6 IMPLANT
TRAY DSU PREP LF (CUSTOM PROCEDURE TRAY) ×2 IMPLANT
TRAY FOL W/BAG SLVR 16FR STRL (SET/KITS/TRAYS/PACK) IMPLANT
TRAY FOLEY W/BAG SLVR 16FR LF (SET/KITS/TRAYS/PACK) ×4
TRENDGUARD 450 HYBRID PRO PACK (MISCELLANEOUS) ×4
TROCAR XCEL NON BLADE 8MM B8LT (ENDOMECHANICALS) ×4 IMPLANT

## 2020-12-31 NOTE — Anesthesia Procedure Notes (Signed)
Procedure Name: Intubation Date/Time: 12/31/2020 9:17 AM Performed by: Rogers Blocker, CRNA Pre-anesthesia Checklist: Patient identified, Emergency Drugs available, Suction available and Patient being monitored Patient Re-evaluated:Patient Re-evaluated prior to induction Oxygen Delivery Method: Circle System Utilized Preoxygenation: Pre-oxygenation with 100% oxygen Induction Type: IV induction Ventilation: Mask ventilation without difficulty Laryngoscope Size: Mac and 3 Grade View: Grade I Tube type: Oral Tube size: 7.0 mm Number of attempts: 1 Airway Equipment and Method: Stylet and Bite block Placement Confirmation: ETT inserted through vocal cords under direct vision, positive ETCO2 and breath sounds checked- equal and bilateral Secured at: 22 cm Tube secured with: Tape Dental Injury: Teeth and Oropharynx as per pre-operative assessment

## 2020-12-31 NOTE — Discharge Instructions (Signed)

## 2020-12-31 NOTE — Transfer of Care (Signed)
Immediate Anesthesia Transfer of Care Note  Patient: Rionna M Papandrea  Procedure(s) Performed: XI ROBOTIC ASSISTED TOTAL HYSTERECTOMY with bilateral salpingectomy (Abdomen) ABDOMINO SACROCOLPOPEXY (Abdomen) CYSTOSCOPY (Urethra)  Patient Location: PACU  Anesthesia Type:General  Level of Consciousness: awake, alert , oriented and patient cooperative  Airway & Oxygen Therapy: Patient Spontanous Breathing  Post-op Assessment: Report given to RN and Post -op Vital signs reviewed and stable  Post vital signs: Reviewed and stable  Last Vitals:  Vitals Value Taken Time  BP 134/80 12/31/20 1241  Temp 36.8 C 12/31/20 1241  Pulse 82 12/31/20 1244  Resp 12 12/31/20 1244  SpO2 93 % 12/31/20 1244  Vitals shown include unvalidated device data.  Last Pain:  Vitals:   12/31/20 0746  TempSrc: Oral      Patients Stated Pain Goal: 5 (12/31/20 0816)  Complications: No notable events documented.

## 2020-12-31 NOTE — Interval H&P Note (Signed)
History and Physical Interval Note:  12/31/2020 8:37 AM  Kristen [name]  has presented today for surgery, with the diagnosis of uterovaginal prolapse incomplete.  The various methods of treatment have been discussed with the patient and family. After consideration of risks, benefits and other options for treatment, the patient has consented to  Procedure(s) with comments: XI ROBOTIC ASSISTED TOTAL HYSTERECTOMY with bilateral salpingectomy (N/A) ABDOMINO SACROCOLPOPEXY (N/A) CYSTOSCOPY (N/A) as a surgical intervention.  The patient's history has been reviewed, patient examined, no change in status, stable for surgery.  I have reviewed the patient's chart and labs.  Questions were answered to the patient's satisfaction.     Marguerita Beards

## 2020-12-31 NOTE — Anesthesia Postprocedure Evaluation (Signed)
Anesthesia Post Note  Patient: Kristen [name]  Procedure(s) Performed: XI ROBOTIC ASSISTED TOTAL HYSTERECTOMY with bilateral salpingectomy (Abdomen) ABDOMINO SACROCOLPOPEXY (Abdomen) CYSTOSCOPY (Urethra)     Patient location during evaluation: PACU Anesthesia Type: General Level of consciousness: awake and alert Pain management: pain level controlled Vital Signs Assessment: post-procedure vital signs reviewed and stable Respiratory status: spontaneous breathing, nonlabored ventilation and respiratory function stable Cardiovascular status: blood pressure returned to baseline and stable Postop Assessment: no apparent nausea or vomiting Anesthetic complications: no   No notable events documented.  Last Vitals:  Vitals:   12/31/20 1456 12/31/20 1742  BP: 118/80 121/67  Pulse: 84 86  Resp: 16 15  Temp:  36.7 C  SpO2: 98% 100%    Last Pain:  Vitals:   12/31/20 1600  TempSrc:   PainSc: Asleep                 Cecile Hearing

## 2020-12-31 NOTE — Anesthesia Preprocedure Evaluation (Addendum)
Anesthesia Evaluation  Patient identified by MRN, date of birth, ID band Patient awake    Reviewed: Allergy & Precautions, NPO status , Patient's Chart, lab work & pertinent test results  Airway Mallampati: I  TM Distance: >3 FB Neck ROM: Full    Dental  (+) Teeth Intact, Dental Advisory Given   Pulmonary asthma , sleep apnea and Continuous Positive Airway Pressure Ventilation ,    Pulmonary exam normal breath sounds clear to auscultation       Cardiovascular hypertension, Pt. on medications Normal cardiovascular exam Rhythm:Regular Rate:Normal     Neuro/Psych  Headaches, PSYCHIATRIC DISORDERS Anxiety Depression TIA   GI/Hepatic negative GI ROS, Neg liver ROS,   Endo/Other  Hypothyroidism   Renal/GU negative Renal ROS Bladder dysfunction      Musculoskeletal  (+) Arthritis , Fibromyalgia -, narcotic dependent  Abdominal   Peds  (+) ATTENTION DEFICIT DISORDER WITHOUT HYPERACTIVITY Hematology negative hematology ROS (+)   Anesthesia Other Findings Day of surgery medications reviewed with the patient.  Reproductive/Obstetrics uterovaginal prolapse incomplete                             Anesthesia Physical Anesthesia Plan  ASA: 2  Anesthesia Plan: General   Post-op Pain Management: Tylenol PO (pre-op)   Induction: Intravenous  PONV Risk Score and Plan: 4 or greater and Scopolamine patch - Pre-op, Midazolam, Dexamethasone and Ondansetron  Airway Management Planned: Oral ETT  Additional Equipment:   Intra-op Plan:   Post-operative Plan: Extubation in OR  Informed Consent: I have reviewed the patients History and Physical, chart, labs and discussed the procedure including the risks, benefits and alternatives for the proposed anesthesia with the patient or authorized representative who has indicated his/her understanding and acceptance.     Dental advisory given  Plan Discussed  with: CRNA  Anesthesia Plan Comments: (2nd PIV after induction)        Anesthesia Quick Evaluation

## 2020-12-31 NOTE — Op Note (Signed)
Operative Note  Preoperative Diagnosis: anterior vaginal prolapse, posterior vaginal prolapse, and uterovaginal prolapse, incomplete  Postoperative Diagnosis: same  Procedures performed:  Robotic assisted total laparoscopic hysterectomy, bilateral salpingectomy, sacrocolpopexy, cystoscopy  Implants:  Implant Name Type Inv. Item Serial No. Manufacturer Lot No. LRB No. Used Action  MESH [employer] [id number] Mesh General MESH Arlys John  [facility] [id number] N/A 1 Implanted    Attending Surgeon: Lanetta Inch, MD  Anesthesia: General endotracheal  Findings: 1. On laparoscopy, adhesions of the large bowel to the left pelvic sidewall, and adhesion of small bowel to the right sidewall. Normal appearing uterus, fallopian tubes and ovaries.   2. On cystoscopy, normal bladder and urethra without injury or lesion. Brisk bilateral efflux noted.   Specimens:  ID Type Source Tests Collected by Time Destination  1 : uterus, cervix, bilateral fallopian tubes Tissue PATH Gyn benign resection SURGICAL PATHOLOGY Marguerita Beards, MD 12/31/2020 1031     Estimated blood loss: 75 mL  IV fluids: 1000 mL  Urine output: 450 mL  Complications: none  Procedure in Detail:  After informed consent was obtained, the patient was taken to the operating room, where general anesthesia was induced and found to be adequate. She was placed in dorsolithotomy position in yellowfin stirrups. Her hips were noted not to be hyperflexed or hyperextended. Her arms were padded with gel pads and tucked to her sides. Her hands were surrounded by foam. A padded strap was placed across her chest with foam between the pad and her skin. She was noted to be appropriately positioned with all pressure points well padded and off tension. A tilt test showed no slippage. She was prepped and draped in the usual sterile fashion. A uterine manipulator was placed in the uterus after sounding to 10 cm, an  appropriately sized Koh ring was placed around the cervix, and a pneumo-occluder balloon was positioned in the vagina for later use.  A sterile Foley catheter was inserted.   0.25% plain Marcaine was injected in the supraumbilical  area and an 8 mm supraumbilical skin incision was made with the scalpel.  A Veress needle was inserted into the incision, CO2 insufflation was started, a low opening pressure was noted, and pneumoperitoneum was obtained. The Veress needle was removed and a 67mm robotic trocar was placed. The robotic camera was inserted and intraperitoneal placement was confirmed. Survey of the abdomen and pelvis revealed the findings as noted above, specifically bowel adhesions. The uterine manipulator had also perforated. The sacrum appeared to be free of any adhesive disease. After determining placement for the other ports, Local anesthetic was injected at each site and two 8 mm incisions were made for robotic ports at 10 cm lateral to and at the level of the umbilical port. Two additional 8 mm incisions were made 10 cm lateral to these and 30 degrees down followed by 8 mm robotic ports - the right side for an assistant port. All trocars were placed sequentially under direct visualization of the camera. The patient was placed in Trendelenburg. The robot was docked on the patient's left side. Monopolar endoshears were placed in the right arm, a [place] bipolar grasper was placed in the 2nd arm of the patient's left side, and a Tip up grasper was placed in the 3rd arm on the patient's left side.   Attention was then turned to the robotic hysterectomy and salpingectomy. The left fallopian tube was grasped on the fimbriated end. The mesosalpinx was cauterized  and incised to remove the fallopian tube. The left round ligament was grasped, cauterized, and transected with electrocautery. The ureter was identified and was found to be well away from the planned site of incision. Using the monopolar scissors,  a window was made on the posterior leaf of the broad ligament. The infundibulopelvic ligament was cauterized and transected. The anterior and posterior leaves of the broad ligament were taken down with cautery and sharp dissection. The uterine artery was skeletonized and the bladder flap was created on the right side with a combination of electrosurgery and sharp dissection. The KOH ring was identified. The right uterine artery was clamped, cauterized, and transected. In a similar fashion, the right side was taken down. Further sharp dissection with combination of cautery was performed to further develop the bladder flap. At this point, the KOH ring was completely hugging the cervix. The pneumo-occluder balloon in the vagina was inflated to maintain pneumoperitoneum. A colpotomy was performed with electrosurgical cutting current and the uterus and cervix were completely amputated from the vagina. The specimen was delivered through the vagina. The posterior portion of the vaginal cuff was then grasped and pulled up to maintain pneumoperitoneum. The pneumo-occluder balloon was then replaced in the vagina. The right hand instrument was changed to a suture-cut needle driver. The vaginal cuff was then closed using a 0 V-lock suture.    The right hand instrument was replaced with monopolar endoshears. With a lucite probe in the vagina, The anterior vaginal dissection was then performed with sharp dissection and electrosurgery. The posterior vaginal dissection was then performed with sharp dissection and electrosurgery in order to dissect the rectum away from the posterior vagina.  Attention was then returned to the sacral promontory, which was palpated with an assistant grasper to confirm correct location.  The overlying areolar and adipose tissue were taken down until the anterior longitudinal sacral ligament was identified. Small vessels were cauterized along the way to obtain excellent hemostasis.  The peritoneal  incision was extended down to the posterior cul-de-sac. This was performed with care to avoid the ureter on the right side and the sigmoid colon and its mesentary on the left side. A "Y" mesh was then inserted into the abdomen after trimming to appropriate size. With the probe in the vagina, the anterior leaf of the Y mesh was affixed to the anterior portion of the vagina using a 2-0 v-loc suture in a spiral pattern to distribute the suture evenly across the surface of the anterior mesh leaf. In a similar fashion, the posterior leaf of the Y mesh was attached to the posterior surface of the vagina with 2-0 v-loc suture. Arista was placed in the dissected planes and over the pedicles for hemostasis. The distal end of the mesh was then brought to overlie the sacrum. The correct amount of tension was determined in order to elevate the vagina, but not put the mesh under tension. The distal end of the mesh was then affixed to the anterior longitudinal sacral ligament using two interrupted transverse stitches of 2-0 Gortex. The excess distal mesh was then cut and removed. The peritoneum was reapproximated over the mesh using 2-0 monocryl. The bladder flap was incorporated to completely retroperitonealize the mesh. All pedicles were carefully inspected and noted to be hemostatic as the CO2 gas was deflated. All instruments were removed from the patient's abdomen.   The Foley catheter was removed.  A 70-degree cystoscope was introduced, and 360-degree inspection revealed no injury, lesion or foreign body in  the bladder. Brisk bilateral ureteral efflux was noted with the assistance of pyridium.  The bladder was drained and the cystoscope was removed.  The Foley catheter was replaced.  The robot was undocked. The CO2 gas was removed and the ports were removed.  The skin incisions were closed with subcutaneous stitches of 4-0 Monocryl and covered with skin glue.  Sponge, lap, and needle counts were correct x 2. The patient  tolerated the procedure well. She was awakened from anesthesia and transferred to the recovery room in stable condition.    Marguerita Beards, MD

## 2021-01-01 ENCOUNTER — Encounter (HOSPITAL_BASED_OUTPATIENT_CLINIC_OR_DEPARTMENT_OTHER): Payer: Self-pay | Admitting: Obstetrics and Gynecology

## 2021-01-01 ENCOUNTER — Telehealth: Payer: Self-pay | Admitting: Obstetrics and Gynecology

## 2021-01-01 DIAGNOSIS — G8918 Other acute postprocedural pain: Secondary | ICD-10-CM

## 2021-01-01 LAB — SURGICAL PATHOLOGY

## 2021-01-01 NOTE — Telephone Encounter (Signed)
Ebonie DEVA RON underwent Robotic assisted total laparoscopic hysterectomy, bilateral salpingectomy, sacrocolpopexy, cystoscopy on 01/01/21.   She passed her voiding trial.  was backfilled into the bladder Voided 250 and  PVR by bladder scan was 24ml.   She was discharged without a catheter. Please call her for a routine post op check. Thanks!  Marguerita Beards, MD

## 2021-01-02 MED ORDER — OXYCODONE HCL 5 MG PO TABS: 5.0000 mg | ORAL_TABLET | ORAL | 0 refills | Status: DC | PRN

## 2021-01-02 NOTE — Telephone Encounter (Signed)
She is taking 2 oxycodone every 4 hours. When she takes it, pain is well controlled, but if she delays, the pain is uncomfortable. She only has 3 more left. She feels she only needs it for 1-2 more days because pain has been improving. She is also taking the ibuprofen and tylenol. Has not had a BM yet- she took miralax yesterday but not today yet. Advised to take it daily to keep BM soft. She is voiding without difficulty.   Advised her not to use boric acid suppositories for at least 2 weeks. She should not submerge in water until after her 6 week post op visit. She will call with any further concerns.   Marguerita Beards, MD

## 2021-01-02 NOTE — Telephone Encounter (Signed)
Pt called office requesting to speak with someone regarding post op questions. She states she would like to clarify whether it is safe to use boric acid, and verify her post op terms and restrictions. Please call pt and advise.

## 2021-01-02 NOTE — Telephone Encounter (Signed)
LMOVM for pt to call office 

## 2021-01-03 NOTE — Telephone Encounter (Signed)
Pt not available. LM on the VM for the patient to call us back. I sent a Pt secure message

## 2021-01-10 ENCOUNTER — Encounter: Payer: Self-pay | Admitting: Family Medicine

## 2021-01-10 ENCOUNTER — Encounter: Payer: Self-pay | Admitting: *Deleted

## 2021-01-10 ENCOUNTER — Other Ambulatory Visit: Payer: Self-pay | Admitting: Family Medicine

## 2021-01-10 NOTE — Telephone Encounter (Addendum)
MyChart message follow up TC to pt.  I called patient since the patient had not responded to Dr Tawni Levy message.  Kristen [name] stated had not seen the message yet.   Pt states No fever, took temp while on phone with me, reported 98.7.  No chills, No N/V.  Pt stated that has felt better throughout the day today.  Feels like it could have been a gas pocket as she felt something "release" where her pain was. Kristen [name] stated also that she couldn't stand up straight prior to this "release" but after can stand straight and feels better. Has eaten solid food today and the pain she after eating was significantly decreased than what she had last night after eating.  Also has had a BM today, and felt more like a normal BM.  Kristen [name] doesn't need have anymore pain medication and doesn't have consistent pain therefore unsure she needs it.  Hasnt taken any tylenol or ibuprofen.  I recommended to continue to monitor her symptoms, take tylenol or ibuprofen for pain, advised walking around the house or rocking on all fours if feels is gas related could help and if the high level of pain happens again to reach out to the office tomorrow for advice/recommendation.  Also advised that if she develops fever, chills or N/V then to immediately let the MD on call know.  Sherrilyn Rist CMA

## 2021-01-11 ENCOUNTER — Other Ambulatory Visit: Payer: Self-pay

## 2021-01-11 ENCOUNTER — Other Ambulatory Visit: Payer: Self-pay | Admitting: Family Medicine

## 2021-01-11 DIAGNOSIS — J45909 Unspecified asthma, uncomplicated: Secondary | ICD-10-CM

## 2021-01-11 MED ORDER — BLISOVI 24 FE 1-20 MG-MCG(24) PO TABS: 1.0000 | ORAL_TABLET | Freq: Every day | ORAL | 11 refills | Status: DC

## 2021-01-11 MED ORDER — BUDESONIDE-FORMOTEROL FUMARATE 160-4.5 MCG/ACT IN AERO: INHALATION_SPRAY | RESPIRATORY_TRACT | 5 refills | Status: DC

## 2021-01-11 MED ORDER — SYNTHROID 137 MCG PO TABS: 137.0000 ug | ORAL_TABLET | Freq: Every day | ORAL | 1 refills | Status: DC

## 2021-01-11 MED ORDER — AMPHETAMINE-DEXTROAMPHETAMINE 20 MG PO TABS: 20.0000 mg | ORAL_TABLET | Freq: Two times a day (BID) | ORAL | 0 refills | Status: DC

## 2021-01-11 MED ORDER — ACYCLOVIR 200 MG PO CAPS: ORAL_CAPSULE | ORAL | 11 refills | Status: DC

## 2021-01-11 MED ORDER — ALBUTEROL SULFATE HFA 108 (90 BASE) MCG/ACT IN AERS: 2.0000 | INHALATION_SPRAY | Freq: Four times a day (QID) | RESPIRATORY_TRACT | 3 refills | Status: DC | PRN

## 2021-01-11 MED ORDER — FLUCONAZOLE 150 MG PO TABS
150.0000 mg | ORAL_TABLET | ORAL | 2 refills | Status: DC
Start: 2021-01-11 — End: 2023-01-09

## 2021-01-11 MED ORDER — LIDOCAINE HCL 2 % EX GEL: CUTANEOUS | 2 refills | Status: DC

## 2021-01-11 MED ORDER — EPINEPHRINE 0.3 MG/0.3ML IJ SOAJ: 0.3000 mg | INTRAMUSCULAR | 2 refills | Status: DC | PRN

## 2021-01-11 NOTE — Telephone Encounter (Signed)
Patient is requesting a refill of the following medications: Requested Prescriptions   Pending Prescriptions Disp Refills   fluconazole (DIFLUCAN) 150 MG tablet      Sig: Take 1 tablet (150 mg total) by mouth once a week. Takes prn   amphetamine-dextroamphetamine (ADDERALL) 20 MG tablet 60 tablet 0    Sig: Take 1 tablet (20 mg total) by mouth 2 (two) times daily.    Date of patient request: 01/11/21 Last office visit: 06/18/20 Date of last refill: 11/05/20 Last refill amount: 60 + 0 Follow up time period per chart: 01/28/21

## 2021-01-13 ENCOUNTER — Other Ambulatory Visit: Payer: Self-pay | Admitting: Family Medicine

## 2021-01-15 ENCOUNTER — Other Ambulatory Visit: Payer: Self-pay | Admitting: Family Medicine

## 2021-01-15 MED ORDER — HYDROCODONE-ACETAMINOPHEN 5-325 MG PO TABS: 1.0000 | ORAL_TABLET | Freq: Four times a day (QID) | ORAL | 0 refills | Status: DC | PRN

## 2021-01-17 ENCOUNTER — Other Ambulatory Visit: Payer: Self-pay | Admitting: Family Medicine

## 2021-01-17 ENCOUNTER — Telehealth: Payer: Self-pay | Admitting: Family Medicine

## 2021-01-17 MED ORDER — LIDOCAINE 5 % EX OINT: 1.0000 "application " | TOPICAL_OINTMENT | Freq: Every day | CUTANEOUS | 1 refills | Status: DC | PRN

## 2021-01-17 NOTE — Telephone Encounter (Signed)
Lidocaine HCL 2% Jelly is no longer available.  Needs to change to Lidocaine 5% ointment.

## 2021-01-23 ENCOUNTER — Telehealth: Payer: Self-pay | Admitting: Obstetrics and Gynecology

## 2021-01-23 NOTE — Telephone Encounter (Signed)
Pt was contacted and Dr Florian Buff notified. Pt said she will wait to come in. She will loosely put a Band-Aid over the suture

## 2021-01-28 ENCOUNTER — Encounter: Payer: Self-pay | Admitting: Family Medicine

## 2021-01-28 ENCOUNTER — Other Ambulatory Visit: Payer: Self-pay | Admitting: Family Medicine

## 2021-01-28 ENCOUNTER — Ambulatory Visit (INDEPENDENT_AMBULATORY_CARE_PROVIDER_SITE_OTHER): Payer: BC Managed Care – PPO | Admitting: Family Medicine

## 2021-01-28 VITALS — BP 152/86 | HR 82 | Temp 98.1°F | Resp 16 | Ht 62.0 in | Wt 162.0 lb

## 2021-01-28 DIAGNOSIS — E782 Mixed hyperlipidemia: Secondary | ICD-10-CM

## 2021-01-28 DIAGNOSIS — E559 Vitamin D deficiency, unspecified: Secondary | ICD-10-CM | POA: Diagnosis not present

## 2021-01-28 DIAGNOSIS — F419 Anxiety disorder, unspecified: Secondary | ICD-10-CM

## 2021-01-28 DIAGNOSIS — K1370 Unspecified lesions of oral mucosa: Secondary | ICD-10-CM

## 2021-01-28 DIAGNOSIS — Z0000 Encounter for general adult medical examination without abnormal findings: Secondary | ICD-10-CM | POA: Diagnosis not present

## 2021-01-28 DIAGNOSIS — F1190 Opioid use, unspecified, uncomplicated: Secondary | ICD-10-CM

## 2021-01-28 DIAGNOSIS — I10 Essential (primary) hypertension: Secondary | ICD-10-CM

## 2021-01-28 DIAGNOSIS — E039 Hypothyroidism, unspecified: Secondary | ICD-10-CM | POA: Diagnosis not present

## 2021-01-28 DIAGNOSIS — Z79899 Other long term (current) drug therapy: Secondary | ICD-10-CM

## 2021-01-28 DIAGNOSIS — R300 Dysuria: Secondary | ICD-10-CM | POA: Diagnosis not present

## 2021-01-28 DIAGNOSIS — N390 Urinary tract infection, site not specified: Secondary | ICD-10-CM

## 2021-01-28 DIAGNOSIS — N812 Incomplete uterovaginal prolapse: Secondary | ICD-10-CM

## 2021-01-28 DIAGNOSIS — J4520 Mild intermittent asthma, uncomplicated: Secondary | ICD-10-CM

## 2021-01-28 DIAGNOSIS — E538 Deficiency of other specified B group vitamins: Secondary | ICD-10-CM | POA: Diagnosis not present

## 2021-01-28 LAB — COMPREHENSIVE METABOLIC PANEL
ALT: 22 U/L (ref 0–35)
AST: 21 U/L (ref 0–37)
Albumin: 4.4 g/dL (ref 3.5–5.2)
Alkaline Phosphatase: 85 U/L (ref 39–117)
BUN: 12 mg/dL (ref 6–23)
CO2: 31 mEq/L (ref 19–32)
Calcium: 9.6 mg/dL (ref 8.4–10.5)
Chloride: 97 mEq/L (ref 96–112)
Creatinine, Ser: 0.7 mg/dL (ref 0.40–1.20)
GFR: 104.68 mL/min (ref >60.00)
Glucose, Bld: 92 mg/dL (ref 70–99)
Potassium: 3.6 mEq/L (ref 3.5–5.1)
Sodium: 137 mEq/L (ref 135–145)
Total Bilirubin: 0.6 mg/dL (ref 0.2–1.2)
Total Protein: 7.2 g/dL (ref 6.0–8.3)

## 2021-01-28 LAB — URINALYSIS
Bilirubin Urine: NEGATIVE
Ketones, ur: NEGATIVE
Leukocytes,Ua: NEGATIVE
Nitrite: NEGATIVE
Specific Gravity, Urine: 1.015 (ref 1.000–1.030)
Total Protein, Urine: NEGATIVE
Urine Glucose: NEGATIVE
Urobilinogen, UA: 0.2 (ref 0.0–1.0)
pH: 7 (ref 5.0–8.0)

## 2021-01-28 LAB — CBC WITH DIFFERENTIAL/PLATELET
Basophils Absolute: 0.1 10*3/uL (ref 0.0–0.1)
Basophils Relative: 0.9 % (ref 0.0–3.0)
Eosinophils Absolute: 0.6 10*3/uL (ref 0.0–0.7)
Eosinophils Relative: 5.3 % — ABNORMAL HIGH (ref 0.0–5.0)
HCT: 44.4 % (ref 36.0–46.0)
Hemoglobin: 14.3 g/dL (ref 12.0–15.0)
Lymphocytes Relative: 47.9 % — ABNORMAL HIGH (ref 12.0–46.0)
Lymphs Abs: 5.4 10*3/uL — ABNORMAL HIGH (ref 0.7–4.0)
MCHC: 32.2 g/dL (ref 30.0–36.0)
MCV: 91.3 fl (ref 78.0–100.0)
Monocytes Absolute: 0.7 10*3/uL (ref 0.1–1.0)
Monocytes Relative: 6.2 % (ref 3.0–12.0)
Neutro Abs: 4.5 10*3/uL (ref 1.4–7.7)
Neutrophils Relative %: 39.7 % — ABNORMAL LOW (ref 43.0–77.0)
Platelets: 231 10*3/uL (ref 150.0–400.0)
RBC: 4.86 Mil/uL (ref 3.87–5.11)
RDW: 14.1 % (ref 11.5–15.5)
WBC: 11.2 10*3/uL — ABNORMAL HIGH (ref 4.0–10.5)

## 2021-01-28 LAB — LIPID PANEL
Cholesterol: 340 mg/dL — ABNORMAL HIGH (ref 0–200)
HDL: 37.4 mg/dL — ABNORMAL LOW (ref >39.00)
Total CHOL/HDL Ratio: 9
Triglycerides: 666 mg/dL — ABNORMAL HIGH (ref 0.0–149.0)

## 2021-01-28 LAB — VITAMIN D 25 HYDROXY (VIT D DEFICIENCY, FRACTURES): VITD: 32.03 ng/mL (ref 30.00–100.00)

## 2021-01-28 LAB — TSH: TSH: 1.37 u[IU]/mL (ref 0.35–5.50)

## 2021-01-28 LAB — VITAMIN B12: Vitamin B-12: 826 pg/mL (ref 211–911)

## 2021-01-28 LAB — LDL CHOLESTEROL, DIRECT: Direct LDL: 170 mg/dL

## 2021-01-28 MED ORDER — METHYLPREDNISOLONE 4 MG PO TABS: ORAL_TABLET | ORAL | 0 refills | Status: DC

## 2021-01-28 MED ORDER — CEFDINIR 300 MG PO CAPS: 300.0000 mg | ORAL_CAPSULE | Freq: Two times a day (BID) | ORAL | 1 refills | Status: DC

## 2021-01-28 NOTE — Assessment & Plan Note (Signed)
Patient encouraged to maintain heart healthy diet, regular exercise, adequate sleep. Consider daily probiotics. Take medications as prescribed. Labs ordered and reviewed. Follows with GYN for paps and MGMs. Will request records. She declines vaccinations.

## 2021-01-28 NOTE — Assessment & Plan Note (Signed)
Supplement and monitor 

## 2021-01-28 NOTE — Assessment & Plan Note (Signed)
Recently had TAH SPO and reconstruction of her pelvic floor and her urethra. She is feeling better but she notes an increase in dysuria the past day or two. Started on Cefdinir and urine culture is ordered.

## 2021-01-28 NOTE — Assessment & Plan Note (Signed)
Recently had TAH SPO and reconstruction of her pelvic floor and her urethra performed on December 19 by Dr Wannetta Sender of Urogynecology

## 2021-01-28 NOTE — Assessment & Plan Note (Signed)
On Levothyroxine, continue to monitor 

## 2021-01-28 NOTE — Progress Notes (Signed)
Subjective:    Patient ID: Kristen [name], female    DOB: 05/13/1975, 46 y.o.   MRN: [id number]  Chief Complaint  Patient presents with   Annual Exam    HPI Patient is in today for annual prventative exam and follow up on chronic medical concerns. She has had a rough time this past year and has been unable to work much due to several surgeries. She had a surgery to remove an oral mass in November and a TAH SPO and urethral repair in December. She has under gone months worth of Physical therapy after an injury to her left ankle as well. No recent febrile illness or other hospitalizations but she is noting increased sinus pressure and congestion and this week she is also noting some increased chest pressure and SOB. Denies CP/palp/HA/fevers/GI or GU c/o. Taking meds as prescribed   Past Medical History:  Diagnosis Date   Abnormal cervical cytology 09/26/2011   LGSIL in 2011, patient reports repeat pap was normal, has had intermittent abnormals with bx in past but always normalized, has had cryotherapy with good results Menarche at 68, irregular without birth control at times, very heavy LMP 08/26/2011 No gyn concerns, MGM last year normal    ADD (attention deficit disorder)    Allergy    seasonal   Anemia    hx of none recent per pt on 12-27-2020   Asthma    mild, intermittent   Chicken pox as a child   Claustrophobia 12/27/2020   Concussion 09/24/2010   DJD (degenerative joint disease)    lower back   Family history of heart disease    Female bladder prolapse, acquired 09/24/2010   Fibromyalgia    Genital herpes 12/27/2020   Headache 09/26/2013   History of concussion 09/24/2010   headaches  x 8 months  and  memeory problems for months no current residual from   History of kidney stones    History of migraines 09/24/2010   History of postpartum depression 2002   HSV-2 infection 09/24/2010   HTN (hypertension) 09/24/2010   Hyperlipidemia    Hypertension    Hypertriglyceridemia  08/11/2019   Hypothyroidism    Interstitial cystitis 02/18/2011   Left shoulder pain 05/27/2016   Migraine 09/24/2010   Migraine aura, persistent, with cerebral infarct, status over 72 hours 09/24/2010   Myalgia 06/08/2018   Neck pain, musculoskeletal 12/16/2010   oa  and djd no limited neck motion   OSA (obstructive sleep apnea) 08/28/2020   using auto set 6 to 17   Overweight 05/29/2015   Pharyngitis    sore throat and hurts when yawns since nov t & s surgery is improving   Pneumonia    last time 2012   Poor concentration 12/16/2010   Recurrent UTI    last uti 1st of december   Self-catheterizes urinary bladder 12/27/2020   prn   Subacute and chronic vaginitis 09/24/2010   TIA (transient ischemic attack) 2015   saw dr berry none since   Tick bite 12/04/2013   rocky mountain spotted fever x 3 months last time 2015   Urinary incontinence    weras pads   Vaginitis and vulvovaginitis 05/21/2014   Wears glasses    or contacts    Past Surgical History:  Procedure Laterality Date   ABDOMINAL SACROCOLPOPEXY N/A 12/31/2020   Procedure: ABDOMINO SACROCOLPOPEXY;  Surgeon: Jaquita Folds, MD;  Location: [facility];  Service: Gynecology;  Laterality: N/A;   ANTERIOR AND POSTERIOR  REPAIR  2003   colonscopy  2017   polyps removed due now   CYSTOSCOPY N/A 12/31/2020   Procedure: CYSTOSCOPY;  Surgeon: Jaquita Folds, MD;  Location: [facility];  Service: Gynecology;  Laterality: N/A;   INCONTINENCE SURGERY  01/13/2001   ROBOTIC ASSISTED TOTAL HYSTERECTOMY N/A 12/31/2020   Procedure: XI ROBOTIC ASSISTED TOTAL HYSTERECTOMY with bilateral salpingectomy;  Surgeon: Jaquita Folds, MD;  Location: [facility];  Service: Gynecology;  Laterality: N/A;   TONSILLECTOMY  11/15/2020   and adenoids removed due to constant infections at [facility] [place]    Family History  Problem Relation Age of Onset   Osteoporosis  Mother    Hyperlipidemia Mother    Kidney disease Father    Heart attack Father    Hyperlipidemia Father    Hypertension Father    Alcohol abuse Father    Heart disease Father    Heart attack Brother 56   Hypertension Brother    Heart disease Brother 23       MI   Hyperlipidemia Brother    Heart disease Maternal Grandmother    Heart failure Maternal Grandmother    Valvular heart disease Maternal Grandmother    Dementia Maternal Grandfather 92       early stages   Stroke Maternal Grandfather    Heart failure Maternal Grandfather    Heart attack Paternal Grandfather    Stroke Paternal Grandfather    Heart disease Paternal Grandfather    Alcohol abuse Paternal Grandfather    Hyperlipidemia Paternal Grandfather    Hypertension Paternal Grandfather     Social History   Socioeconomic History   Marital status: Married    Spouse name: Not on file   Number of children: Not on file   Years of education: Not on file   Highest education level: Not on file  Occupational History   Not on file  Tobacco Use   Smoking status: Never   Smokeless tobacco: Never  Vaping Use   Vaping Use: Never used  Substance and Sexual Activity   Alcohol use: No    Alcohol/week: 0.0 standard drinks    Comment: 0   Drug use: No   Sexual activity: Yes    Partners: Male    Birth control/protection: Pill  Other Topics Concern   Not on file  Social History Narrative   Lives with husband 2 children in a one story home.     Works as a Geophysicist/field seismologist.     Education: college.   Social Determinants of Health   Financial Resource Strain: Not on file  Food Insecurity: Not on file  Transportation Needs: Not on file  Physical Activity: Not on file  Stress: Not on file  Social Connections: Not on file  Intimate Partner Violence: Not on file    Outpatient Medications Prior to Visit  Medication Sig Dispense Refill   acetaminophen (TYLENOL) 500 MG tablet Take 1 tablet (500 mg total) by mouth every  6 (six) hours as needed (pain). (Patient taking differently: Take 500 mg by mouth every 6 (six) hours as needed (pain). For after sugery) 30 tablet 0   acyclovir (ZOVIRAX) 200 MG capsule TAKE 1 CAPSULE BY MOUTH TWICE A DAY INS LIMIT OF 30 DAYS 60 capsule 11   albuterol (VENTOLIN HFA) 108 (90 Base) MCG/ACT inhaler Inhale 2 puffs into the lungs every 6 (six) hours as needed. 1 each 3   amphetamine-dextroamphetamine (ADDERALL) 20 MG tablet Take 1 tablet (  20 mg total) by mouth 2 (two) times daily. 60 tablet 0   budesonide-formoterol (SYMBICORT) 160-4.5 MCG/ACT inhaler TAKE 2 PUFFS BY MOUTH TWICE A DAY 10.2 each 5   Cyanocobalamin (VITAMIN B 12 PO) Take by mouth. Vitamin b 12 subligual daily     enalapril (VASOTEC) 10 MG tablet Take 10 mg (one tablet) in the morning.Take 5 mg (one-half tablet) in the evening. 120 tablet 3   EPINEPHrine 0.3 mg/0.3 mL IJ SOAJ injection Inject 0.3 mg into the muscle as needed for anaphylaxis. 2 each 2   fluconazole (DIFLUCAN) 150 MG tablet Take 1 tablet (150 mg total) by mouth once a week. Takes prn 2 tablet 2   HYDROcodone-acetaminophen (NORCO/VICODIN) 5-325 MG tablet Take 1 tablet by mouth every 6 (six) hours as needed for moderate pain. 90 tablet 0   ibuprofen (ADVIL) 600 MG tablet Take 1 tablet (600 mg total) by mouth every 6 (six) hours as needed. (Patient taking differently: Take 600 mg by mouth every 6 (six) hours as needed. For after surgery) 30 tablet 0   lidocaine (XYLOCAINE) 5 % ointment Apply 1 application topically daily as needed. 50 g 1   liothyronine (CYTOMEL) 5 MCG tablet TAKE 1 TABLET BY MOUTH EVERY DAY (Patient taking differently: daily.) 90 tablet 1   MAGNESIUM PO Take by mouth. Otc daily     Multiple Vitamins-Minerals (MULTIVITAMIN WITH MINERALS) tablet Take 1 tablet by mouth daily.     neomycin-polymyxin b-dexamethasone (MAXITROL) 3.5-10000-0.1 SUSP Place 1 drop into both eyes in the morning, at noon, in the evening, and at bedtime. 5 mL 1    nitrofurantoin, macrocrystal-monohydrate, (MACROBID) 100 MG capsule TAKE 1 CAPSULE FOR 1 DOSE AS NEEDED FOR PREVENTION OF UTI *AFTER SELF CATH, SWIMMING, HOT TUB, SEX* 30 capsule 8   Norethindrone Acetate-Ethinyl Estrad-FE (BLISOVI 24 FE) 1-20 MG-MCG(24) tablet Take 1 tablet by mouth daily. 28 tablet 11   ondansetron (ZOFRAN) 4 MG tablet Take 1 tablet (4 mg total) by mouth every 8 (eight) hours as needed for nausea or vomiting. (Patient taking differently: Take 4 mg by mouth every 8 (eight) hours as needed for nausea or vomiting. For after surgery) 10 tablet 0   polyethylene glycol powder (GLYCOLAX/MIRALAX) 17 GM/SCOOP powder Take 17 g by mouth daily. Drink 17g (1 scoop) dissolved in water per day. (Patient taking differently: Take 17 g by mouth daily. Drink 17g (1 scoop) dissolved in water per day. For after surgery) 255 g 0   SYNTHROID 137 MCG tablet Take 1 tablet (137 mcg total) by mouth daily. 90 tablet 1   No facility-administered medications prior to visit.    Allergies  Allergen Reactions   Chocolate Anaphylaxis    Trouble breathing facial swelling   Peanut-Containing Drug Products Anaphylaxis    All nuts of every kind   Clindamycin/Lincomycin Nausea And Vomiting    And diarrhea.    Influenza Vaccines Rash    Significant swelling in arm, rash, malaise, myalgias    Bystolic [Nebivolol Hcl]     Fatigue arm heaviness   Doxazosin     Would not take due to side effects listed in patient information   Enalapril     Swelling at higher doses fatigue   Hydralazine     Urinary retention and edema    Hydrochlorothiazide     Bladder issues and kidney stones   Losartan     Did not work   [employer] [Spironolactone]     Leg cramps   Terazol [Terconazole]  Burning sensation   Latex Rash    Review of Systems  Constitutional:  Positive for malaise/fatigue. Negative for chills and fever.  HENT:  Positive for congestion, sinus pain and sore throat. Negative for hearing loss.   Eyes:   Negative for discharge.  Respiratory:  Positive for shortness of breath. Negative for cough and sputum production.   Cardiovascular:  Negative for chest pain, palpitations and leg swelling.  Gastrointestinal:  Negative for abdominal pain, blood in stool, constipation, diarrhea, heartburn, nausea and vomiting.  Genitourinary:  Positive for dysuria and frequency. Negative for hematuria and urgency.  Musculoskeletal:  Negative for back pain, falls and myalgias.  Skin:  Negative for rash.  Neurological:  Negative for dizziness, sensory change, loss of consciousness, weakness and headaches.  Endo/Heme/Allergies:  Negative for environmental allergies. Does not bruise/bleed easily.  Psychiatric/Behavioral:  Negative for depression and suicidal ideas. The patient is not nervous/anxious and does not have insomnia.       Objective:    Physical Exam Constitutional:      General: She is not in acute distress.    Appearance: She is well-developed.  HENT:     Head: Normocephalic and atraumatic.  Eyes:     Conjunctiva/sclera: Conjunctivae normal.  Neck:     Thyroid: No thyromegaly.  Cardiovascular:     Rate and Rhythm: Normal rate and regular rhythm.     Heart sounds: Normal heart sounds. No murmur heard. Pulmonary:     Effort: Pulmonary effort is normal. No respiratory distress.     Breath sounds: Normal breath sounds.  Abdominal:     General: Bowel sounds are normal. There is no distension.     Palpations: Abdomen is soft. There is no mass.     Tenderness: There is no abdominal tenderness.  Musculoskeletal:     Cervical back: Neck supple.  Lymphadenopathy:     Cervical: No cervical adenopathy.  Skin:    General: Skin is warm and dry.  Neurological:     Mental Status: She is alert and oriented to person, place, and time.  Psychiatric:        Behavior: Behavior normal.    BP (!) 152/86 (BP Location: Right Leg)    Pulse 82    Temp 98.1 F (36.7 C)    Resp 16    Ht '5\' 2"'  (1.575 m)    Wt  162 lb (73.5 kg)    SpO2 98%    BMI 29.63 kg/m  Wt Readings from Last 3 Encounters:  01/28/21 162 lb (73.5 kg)  12/31/20 157 lb 6.4 oz (71.4 kg)  12/27/20 150 lb (68 kg)    Diabetic Foot Exam - Simple   No data filed    Lab Results  Component Value Date   WBC 11.2 (H) 01/28/2021   HGB 14.3 01/28/2021   HCT 44.4 01/28/2021   PLT 231.0 01/28/2021   GLUCOSE 92 01/28/2021   CHOL 340 (H) 01/28/2021   TRIG (H) 01/28/2021    666.0 Triglyceride is over 400; calculations on Lipids are invalid.   HDL 37.40 (L) 01/28/2021   LDLDIRECT 170.0 01/28/2021   LDLCALC 136 (H) 11/22/2012   ALT 22 01/28/2021   AST 21 01/28/2021   NA 137 01/28/2021   K 3.6 01/28/2021   CL 97 01/28/2021   CREATININE 0.70 01/28/2021   BUN 12 01/28/2021   CO2 31 01/28/2021   TSH 1.37 01/28/2021   INR 1.09 05/23/2015    Lab Results  Component Value Date  TSH 1.37 01/28/2021   Lab Results  Component Value Date   WBC 11.2 (H) 01/28/2021   HGB 14.3 01/28/2021   HCT 44.4 01/28/2021   MCV 91.3 01/28/2021   PLT 231.0 01/28/2021   Lab Results  Component Value Date   NA 137 01/28/2021   K 3.6 01/28/2021   CO2 31 01/28/2021   GLUCOSE 92 01/28/2021   BUN 12 01/28/2021   CREATININE 0.70 01/28/2021   BILITOT 0.6 01/28/2021   ALKPHOS 85 01/28/2021   AST 21 01/28/2021   ALT 22 01/28/2021   PROT 7.2 01/28/2021   ALBUMIN 4.4 01/28/2021   CALCIUM 9.6 01/28/2021   ANIONGAP 8 12/27/2020   EGFR 102 04/09/2020   GFR 104.68 01/28/2021   Lab Results  Component Value Date   CHOL 340 (H) 01/28/2021   Lab Results  Component Value Date   HDL 37.40 (L) 01/28/2021   Lab Results  Component Value Date   LDLCALC 136 (H) 11/22/2012   Lab Results  Component Value Date   TRIG (H) 01/28/2021    666.0 Triglyceride is over 400; calculations on Lipids are invalid.   Lab Results  Component Value Date   CHOLHDL 9 01/28/2021   No results found for: HGBA1C     Assessment & Plan:   Problem List Items  Addressed This Visit     Mild intermittent asthma    She feels her chest tightness is worse recently. Will allow a course of Medrol which has helped in the past and is also started on cefdinir for UTI      Relevant Medications   methylPREDNISolone (MEDROL) 4 MG tablet   Recurrent UTI    Recently had TAH SPO and reconstruction of her pelvic floor and her urethra. She is feeling better but she notes an increase in dysuria the past day or two. Started on Cefdinir and urine culture is ordered.       Hyperlipidemia    Encourage heart healthy diet such as MIND or DASH diet, increase exercise, avoid trans fats, simple carbohydrates and processed foods, consider a krill or fish or flaxseed oil cap daily.       Relevant Orders   CBC with Differential/Platelet (Completed)   Comprehensive metabolic panel (Completed)   Lipid panel (Completed)   TSH (Completed)   HTN (hypertension)    Continues to run high but is following with hypertension clinic at cardiology. Numbers have improved with treatment. Did spike during her recent surgeries but have come down some again.       Relevant Orders   CBC with Differential/Platelet (Completed)   Comprehensive metabolic panel (Completed)   Lipid panel (Completed)   TSH (Completed)   Hypothyroid    On Levothyroxine, continue to monitor      Relevant Orders   TSH (Completed)   Preventative health care - Primary    Patient encouraged to maintain heart healthy diet, regular exercise, adequate sleep. Consider daily probiotics. Take medications as prescribed. Labs ordered and reviewed. Follows with GYN for paps and MGMs. Will request records. She declines vaccinations.       Dysuria   Relevant Orders   Urine Culture   Urinalysis (Completed)   Vitamin B12 deficiency    Supplement and monitor      Relevant Orders   Vitamin B12 (Completed)   Vitamin D deficiency    Supplement and monitor      Relevant Orders   VITAMIN D 25 Hydroxy (Vit-D  Deficiency, Fractures) (Completed)   Uterovaginal  prolapse, incomplete    Recently had TAH SPO and reconstruction of her pelvic floor and her urethra performed on December 19 by Dr Wannetta Sender of Urogynecology      Oral lesion    She had surgery to remove a mass behind her left tonsillar pillar on November 29, 2020 which turned out to be non cancerous      Anxiety    Uses Alprazolam infrequently with adequate results may continue use      Other Visit Diagnoses     High risk medication use       Relevant Orders   Drug Monitoring Panel [phone] , Urine   Chronic, continuous use of opioids       Relevant Orders   Drug Monitoring Panel [phone] , Urine   Long-term current use of benzodiazepine       Relevant Orders   Drug Monitoring Panel [phone] , Urine       I am having Eureka start on methylPREDNISolone. I am also having her maintain her neomycin-polymyxin b-dexamethasone, liothyronine, nitrofurantoin (macrocrystal-monohydrate), enalapril, acetaminophen, ibuprofen, polyethylene glycol powder, ondansetron, multivitamin with minerals, MAGNESIUM PO, Cyanocobalamin (VITAMIN B 12 PO), fluconazole, amphetamine-dextroamphetamine, acyclovir, Blisovi 24 Fe, budesonide-formoterol, Synthroid, EPINEPHrine, albuterol, HYDROcodone-acetaminophen, and lidocaine.  Meds ordered this encounter  Medications   methylPREDNISolone (MEDROL) 4 MG tablet    Sig: 5 tabs po x 1 day then 4 tabs po x 1 day then 3 tabs po x 1 day then 2 tabs po x 1 day then 1 tab po x 1 day and stop    Dispense:  15 tablet    Refill:  0   DISCONTD: cefdinir (OMNICEF) 300 MG capsule    Sig: Take 1 capsule (300 mg total) by mouth 2 (two) times daily for 10 days.    Dispense:  14 capsule    Refill:  1     Penni Homans, MD

## 2021-01-28 NOTE — Assessment & Plan Note (Signed)
Uses Alprazolam infrequently with adequate results may continue use

## 2021-01-28 NOTE — Patient Instructions (Signed)
Preventive Care 21-46 Years Old, Female °Preventive care refers to lifestyle choices and visits with your health care provider that can promote health and wellness. Preventive care visits are also called wellness exams. °What can I expect for my preventive care visit? °Counseling °During your preventive care visit, your health care provider may ask about your: °Medical history, including: °Past medical problems. °Family medical history. °Pregnancy history. °Current health, including: °Menstrual cycle. °Method of birth control. °Emotional well-being. °Home life and relationship well-being. °Sexual activity and sexual health. °Lifestyle, including: °Alcohol, nicotine or tobacco, and drug use. °Access to firearms. °Diet, exercise, and sleep habits. °Work and work environment. °Sunscreen use. °Safety issues such as seatbelt and bike helmet use. °Physical exam °Your health care provider may check your: °Height and weight. These may be used to calculate your BMI (body mass index). BMI is a measurement that tells if you are at a healthy weight. °Waist circumference. This measures the distance around your waistline. This measurement also tells if you are at a healthy weight and may help predict your risk of certain diseases, such as type 2 diabetes and high blood pressure. °Heart rate and blood pressure. °Body temperature. °Skin for abnormal spots. °What immunizations do I need? °Vaccines are usually given at various ages, according to a schedule. Your health care provider will recommend vaccines for you based on your age, medical history, and lifestyle or other factors, such as travel or where you work. °What tests do I need? °Screening °Your health care provider may recommend screening tests for certain conditions. This may include: °Pelvic exam and Pap test. °Lipid and cholesterol levels. °Diabetes screening. This is done by checking your blood sugar (glucose) after you have not eaten for a while (fasting). °Hepatitis B  test. °Hepatitis C test. °HIV (human immunodeficiency virus) test. °STI (sexually transmitted infection) testing, if you are at risk. °BRCA-related cancer screening. This may be done if you have a family history of breast, ovarian, tubal, or peritoneal cancers. °Talk with your health care provider about your test results, treatment options, and if necessary, the need for more tests. °Follow these instructions at home: °Eating and drinking ° °Eat a healthy diet that includes fresh fruits and vegetables, whole grains, lean protein, and low-fat dairy products. °Take vitamin and mineral supplements as recommended by your health care provider. °Do not drink alcohol if: °Your health care provider tells you not to drink. °You are pregnant, may be pregnant, or are planning to become pregnant. °If you drink alcohol: °Limit how much you have to 0-1 drink a day. °Know how much alcohol is in your drink. In the [place] one drink equals one 12 oz bottle of beer (355 mL), one 5 oz glass of wine (148 mL), or one 1½ oz glass of hard liquor (44 mL). °Lifestyle °Brush your teeth every morning and night with fluoride toothpaste. Floss one time each day. °Exercise for at least 30 minutes 5 or more days each week. °Do not use any products that contain nicotine or tobacco. These products include cigarettes, chewing tobacco, and vaping devices, such as e-cigarettes. If you need help quitting, ask your health care provider. °Do not use drugs. °If you are sexually active, practice safe sex. Use a condom or other form of protection to prevent STIs. °If you do not wish to become pregnant, use a form of birth control. If you plan to become pregnant, see your health care provider for a prepregnancy visit. °Find healthy ways to manage stress, such as: °Meditation, yoga,   or listening to music. °Journaling. °Talking to a trusted person. °Spending time with friends and family. °Minimize exposure to UV radiation to reduce your risk of skin  cancer. °Safety °Always wear your seat belt while driving or riding in a vehicle. °Do not drive: °If you have been drinking alcohol. Do not ride with someone who has been drinking. °If you have been using any mind-altering substances or drugs. °While texting. °When you are tired or distracted. °Wear a helmet and other protective equipment during sports activities. °If you have firearms in your house, make sure you follow all gun safety procedures. °Seek help if you have been physically or sexually abused. °What's next? °Go to your health care provider once a year for an annual wellness visit. °Ask your health care provider how often you should have your eyes and teeth checked. °Stay up to date on all vaccines. °This information is not intended to replace advice given to you by your health care provider. Make sure you discuss any questions you have with your health care provider. °Document Revised: 06/27/2020 Document Reviewed: 06/27/2020 °Elsevier Patient Education © 2022 [employer] ° °

## 2021-01-28 NOTE — Assessment & Plan Note (Signed)
She feels her chest tightness is worse recently. Will allow a course of Medrol which has helped in the past and is also started on cefdinir for UTI

## 2021-01-28 NOTE — Assessment & Plan Note (Signed)
Continues to run high but is following with hypertension clinic at cardiology. Numbers have improved with treatment. Did spike during her recent surgeries but have come down some again.

## 2021-01-28 NOTE — Assessment & Plan Note (Signed)
She had surgery to remove a mass behind her left tonsillar pillar on November 29, 2020 which turned out to be non cancerous

## 2021-01-28 NOTE — Assessment & Plan Note (Signed)
Encourage heart healthy diet such as MIND or DASH diet, increase exercise, avoid trans fats, simple carbohydrates and processed foods, consider a krill or fish or flaxseed oil cap daily.  °

## 2021-01-30 ENCOUNTER — Encounter: Payer: Self-pay | Admitting: Family Medicine

## 2021-01-31 ENCOUNTER — Ambulatory Visit: Payer: BC Managed Care – PPO | Admitting: Family Medicine

## 2021-01-31 ENCOUNTER — Other Ambulatory Visit: Payer: Self-pay | Admitting: Family Medicine

## 2021-01-31 LAB — DRUG MONITORING PANEL 376104, URINE
Amphetamine: 1178 ng/mL — ABNORMAL HIGH (ref <250)
Amphetamines: POSITIVE ng/mL — AB (ref <500)
Barbiturates: NEGATIVE ng/mL (ref <300)
Benzodiazepines: NEGATIVE ng/mL (ref <100)
Cocaine Metabolite: NEGATIVE ng/mL (ref <150)
Codeine: NEGATIVE ng/mL (ref <50)
Desmethyltramadol: NEGATIVE ng/mL (ref <100)
Hydrocodone: 151 ng/mL — ABNORMAL HIGH (ref <50)
Hydromorphone: NEGATIVE ng/mL (ref <50)
Methamphetamine: NEGATIVE ng/mL (ref <250)
Morphine: NEGATIVE ng/mL (ref <50)
Norhydrocodone: 576 ng/mL — ABNORMAL HIGH (ref <50)
Opiates: POSITIVE ng/mL — AB (ref <100)
Oxycodone: NEGATIVE ng/mL (ref <100)
Tramadol: NEGATIVE ng/mL (ref <100)

## 2021-01-31 LAB — URINE CULTURE
MICRO NUMBER:: 12875170
SPECIMEN QUALITY:: ADEQUATE

## 2021-01-31 LAB — DM TEMPLATE

## 2021-01-31 MED ORDER — PREDNISONE 20 MG PO TABS: 60.0000 mg | ORAL_TABLET | Freq: Every day | ORAL | 0 refills | Status: DC

## 2021-02-11 ENCOUNTER — Other Ambulatory Visit (INDEPENDENT_AMBULATORY_CARE_PROVIDER_SITE_OTHER): Payer: BC Managed Care – PPO

## 2021-02-11 DIAGNOSIS — D72829 Elevated white blood cell count, unspecified: Secondary | ICD-10-CM

## 2021-02-11 LAB — CBC WITH DIFFERENTIAL/PLATELET
Basophils Absolute: 0.1 10*3/uL (ref 0.0–0.1)
Basophils Relative: 0.4 % (ref 0.0–3.0)
Eosinophils Absolute: 0.2 10*3/uL (ref 0.0–0.7)
Eosinophils Relative: 1.6 % (ref 0.0–5.0)
HCT: 43.8 % (ref 36.0–46.0)
Hemoglobin: 14.4 g/dL (ref 12.0–15.0)
Lymphocytes Relative: 31.4 % (ref 12.0–46.0)
Lymphs Abs: 4.6 10*3/uL — ABNORMAL HIGH (ref 0.7–4.0)
MCHC: 32.8 g/dL (ref 30.0–36.0)
MCV: 91.6 fl (ref 78.0–100.0)
Monocytes Absolute: 1 10*3/uL (ref 0.1–1.0)
Monocytes Relative: 6.6 % (ref 3.0–12.0)
Neutro Abs: 8.9 10*3/uL — ABNORMAL HIGH (ref 1.4–7.7)
Neutrophils Relative %: 60 % (ref 43.0–77.0)
Platelets: 209 10*3/uL (ref 150.0–400.0)
RBC: 4.78 Mil/uL (ref 3.87–5.11)
RDW: 14.7 % (ref 11.5–15.5)
WBC: 14.8 10*3/uL — ABNORMAL HIGH (ref 4.0–10.5)

## 2021-02-11 NOTE — Progress Notes (Signed)
[facility]  Date of Visit: 02/12/2021  History of Present Illness: Kristen [name] is a 46 y.o. female scheduled today for a post-operative visit.   Surgery: s/p  Robotic assisted total laparoscopic hysterectomy, bilateral salpingectomy, mesh sacrocolpopexy, cystoscopy on 01/01/21  She passed her postoperative void trial.   Postoperative course has overall been uncomplicated apart from some gas pain requiring medication about 2 weeks after the surgery. She also had the flu. Lately she has felt very tired. Had some trouble with her asthma/ breathing. She was also prescribed an antibiotic from her PCP for possible infection- WBC went from 11 to 14, she is unsure what the infection is. Feels her abdomen is more bloated, pain has been on and off. Also having some pressure on her rectum. Denies fever or chills.   UTI in the last 6 weeks? No - urine culture was negative She has returned to her normal activity (except for postop restrictions) Vaginal bulge? No  Stress incontinence: No  Urgency/frequency: No  Urge incontinence: No  Voiding dysfunction: No  Bowel issues: Bowel movements 1-2 times per day.   Subjective Success: Do you usually have a bulge or something falling out that you can see or feel in the vaginal area? No  Retreatment Success: Any retreatment with surgery or pessary for any compartment? No   Pathology results: UTERUS WITH BILATERAL FALLOPIAN TUBES, HYSTERECTOMY AND BILATERAL  SALPINGECTOMIES:  Uterus:  -  Disordered proliferative endometrium  -  No hyperplasia or malignancy identified   Cervix:  -  Benign cervix with nabothian cysts  -  No dysplasia or malignancy identified    Fallopian tubes, bilateral:  -  Benign fallopian tubes  -  No malignancy identified   Medications: She has a current medication list which includes the following prescription(s): doxycycline, metronidazole, acetaminophen, acyclovir, albuterol, amphetamine-dextroamphetamine,  budesonide-formoterol, cyanocobalamin, enalapril, epinephrine, fluconazole, hydrocodone-acetaminophen, ibuprofen, lidocaine, liothyronine, magnesium, methylprednisolone, multivitamin with minerals, neomycin-polymyxin b-dexamethasone, nitrofurantoin (macrocrystal-monohydrate), blisovi 24 fe, ondansetron, polyethylene glycol powder, prednisone, and synthroid.   Allergies: Patient is allergic to chocolate, peanut-containing drug products, clindamycin/lincomycin, influenza vaccines, bystolic [nebivolol hcl], doxazosin, enalapril, hydralazine, hydrochlorothiazide, losartan, spiractone [spironolactone], terazol [terconazole], and latex.   Physical Exam: BP (!) 164/117    Pulse 81   Abdomen: soft, non-tender, without masses or organomegaly Laparoscopic Incisions: healing well.  Pelvic Examination: Vagina: brown vaginal discharge noted at the cuff- culture obtained. Suture noted at the cuff with granulation tissue, tender to palpation. Culture obtained. No tenderness along the anterior or posterior vagina. No pelvic masses. No visible or palpable mesh.  POP-Q: POP-Q  -3                                            Aa   -3                                           Ba  -7.5                                              C   3  Gh  4                                            Pb  8                                            tvl   -2                                            Ap  -2                                            Bp                                                 D    --------------------------------------------------------- Lab Results  Component Value Date   WBC 14.8 (H) 02/11/2021   HGB 14.4 02/11/2021   HCT 43.8 02/11/2021   MCV 91.6 02/11/2021   PLT 209.0 02/11/2021    Assessment and Plan:  1. Vaginal cuff cellulitis   2. Urinary frequency   3. Complication of implanted vaginal mesh, unspecified complication, initial encounter     - Due to increased WBC and persistent pelvic pain, along with vaginal discharge, there is concern for either infection of the mesh or osteomyelitis of the sacrum due to mesh infection. Therefore, will order MRI of the pelvis to better assess for these issues.  - Culture obtained of vaginal discharge. Will start antibiotics with doxycycline and flagyl for cuff cellulitis.  - Pathology results were reviewed with the patient today and she verbalized understanding that the results were benign.  - The patient was given a copy of her operative note for her records.  Will follow up after MRI.   Kristen Beards, MD

## 2021-02-12 ENCOUNTER — Ambulatory Visit (INDEPENDENT_AMBULATORY_CARE_PROVIDER_SITE_OTHER): Payer: BC Managed Care – PPO | Admitting: Obstetrics and Gynecology

## 2021-02-12 ENCOUNTER — Other Ambulatory Visit: Payer: Self-pay

## 2021-02-12 ENCOUNTER — Encounter: Payer: Self-pay | Admitting: Obstetrics and Gynecology

## 2021-02-12 ENCOUNTER — Other Ambulatory Visit (HOSPITAL_COMMUNITY)
Admission: RE | Admit: 2021-02-12 | Discharge: 2021-02-12 | Disposition: A | Discharge status: Home / Self Care | Payer: BC Managed Care – PPO | Source: Ambulatory Visit | Attending: Obstetrics and Gynecology | Admitting: Obstetrics and Gynecology

## 2021-02-12 VITALS — BP 164/117 | HR 81

## 2021-02-12 DIAGNOSIS — N760 Acute vaginitis: Secondary | ICD-10-CM | POA: Diagnosis not present

## 2021-02-12 DIAGNOSIS — Y828 Other medical devices associated with adverse incidents: Secondary | ICD-10-CM | POA: Diagnosis not present

## 2021-02-12 DIAGNOSIS — R350 Frequency of micturition: Secondary | ICD-10-CM

## 2021-02-12 DIAGNOSIS — T859XXA Unspecified complication of internal prosthetic device, implant and graft, initial encounter: Secondary | ICD-10-CM | POA: Insufficient documentation

## 2021-02-12 LAB — POCT URINALYSIS DIPSTICK
Appearance: NORMAL
Bilirubin, UA: NEGATIVE
Glucose, UA: NEGATIVE
Ketones, UA: NEGATIVE
Leukocytes, UA: NEGATIVE
Nitrite, UA: NEGATIVE
Protein, UA: NEGATIVE
Spec Grav, UA: >=1.03 — AB (ref 1.010–1.025)
Urobilinogen, UA: 0.2 E.U./dL
pH, UA: 7 (ref 5.0–8.0)

## 2021-02-12 MED ORDER — METRONIDAZOLE 500 MG PO TABS: 500.0000 mg | ORAL_TABLET | Freq: Two times a day (BID) | ORAL | 0 refills | Status: AC

## 2021-02-12 MED ORDER — DOXYCYCLINE HYCLATE 100 MG PO TABS: 100.0000 mg | ORAL_TABLET | Freq: Two times a day (BID) | ORAL | 0 refills | Status: AC

## 2021-02-13 ENCOUNTER — Other Ambulatory Visit: Payer: Self-pay | Admitting: Obstetrics and Gynecology

## 2021-02-13 ENCOUNTER — Ambulatory Visit (HOSPITAL_COMMUNITY)
Admission: RE | Admit: 2021-02-13 | Discharge: 2021-02-13 | Disposition: A | Discharge status: Home / Self Care | Payer: BC Managed Care – PPO | Source: Ambulatory Visit | Attending: Obstetrics and Gynecology | Admitting: Obstetrics and Gynecology

## 2021-02-13 ENCOUNTER — Encounter: Payer: Self-pay | Admitting: Obstetrics and Gynecology

## 2021-02-13 DIAGNOSIS — K5730 Diverticulosis of large intestine without perforation or abscess without bleeding: Secondary | ICD-10-CM | POA: Diagnosis not present

## 2021-02-13 DIAGNOSIS — N760 Acute vaginitis: Secondary | ICD-10-CM

## 2021-02-13 DIAGNOSIS — L02211 Cutaneous abscess of abdominal wall: Secondary | ICD-10-CM | POA: Diagnosis not present

## 2021-02-13 DIAGNOSIS — F419 Anxiety disorder, unspecified: Secondary | ICD-10-CM

## 2021-02-13 DIAGNOSIS — T859XXA Unspecified complication of internal prosthetic device, implant and graft, initial encounter: Secondary | ICD-10-CM | POA: Insufficient documentation

## 2021-02-13 DIAGNOSIS — L929 Granulomatous disorder of the skin and subcutaneous tissue, unspecified: Secondary | ICD-10-CM | POA: Diagnosis not present

## 2021-02-13 DIAGNOSIS — D72829 Elevated white blood cell count, unspecified: Secondary | ICD-10-CM | POA: Diagnosis not present

## 2021-02-13 MED ORDER — GADOBUTROL 1 MMOL/ML IV SOLN
7.5000 mL | Freq: Once | INTRAVENOUS | Status: AC | PRN
  Administered 2021-02-13: 7.5 mL via INTRAVENOUS

## 2021-02-13 MED ORDER — HYDROXYZINE HCL 50 MG PO TABS: 50.0000 mg | ORAL_TABLET | Freq: Four times a day (QID) | ORAL | 0 refills | Status: AC | PRN

## 2021-02-13 MED ORDER — HYDROXYZINE HCL 50 MG PO TABS
50.0000 mg | ORAL_TABLET | Freq: Four times a day (QID) | ORAL | 0 refills | Status: DC | PRN
Start: 2021-02-13 — End: 2021-02-13

## 2021-02-13 MED ORDER — DIAZEPAM 5 MG PO TABS: ORAL_TABLET | ORAL | 0 refills | Status: DC

## 2021-02-13 NOTE — Progress Notes (Signed)
Valium 5mg  ordered for MRI.

## 2021-02-16 LAB — CULTURE, ROUTINE-GENITAL: Culture: NORMAL

## 2021-02-18 ENCOUNTER — Encounter (HOSPITAL_BASED_OUTPATIENT_CLINIC_OR_DEPARTMENT_OTHER): Payer: Self-pay | Admitting: Cardiovascular Disease

## 2021-02-18 NOTE — Telephone Encounter (Signed)
Spoke with patient and rescheduled her visit  Advised if any issues between now and follow up to call

## 2021-02-18 NOTE — Telephone Encounter (Signed)
Hey, I do not know how to schedule for HTN clinic, Can y'all help?

## 2021-02-20 ENCOUNTER — Encounter: Payer: Self-pay | Admitting: Obstetrics and Gynecology

## 2021-02-20 ENCOUNTER — Ambulatory Visit (HOSPITAL_BASED_OUTPATIENT_CLINIC_OR_DEPARTMENT_OTHER): Payer: 59 | Admitting: Cardiovascular Disease

## 2021-02-21 DIAGNOSIS — G4733 Obstructive sleep apnea (adult) (pediatric): Secondary | ICD-10-CM | POA: Diagnosis not present

## 2021-02-28 ENCOUNTER — Encounter: Payer: Self-pay | Admitting: Obstetrics and Gynecology

## 2021-02-28 ENCOUNTER — Ambulatory Visit (INDEPENDENT_AMBULATORY_CARE_PROVIDER_SITE_OTHER): Payer: BC Managed Care – PPO | Admitting: Obstetrics and Gynecology

## 2021-02-28 ENCOUNTER — Other Ambulatory Visit: Payer: Self-pay

## 2021-02-28 VITALS — BP 190/129 | HR 87

## 2021-02-28 DIAGNOSIS — M62838 Other muscle spasm: Secondary | ICD-10-CM

## 2021-02-28 DIAGNOSIS — R140 Abdominal distension (gaseous): Secondary | ICD-10-CM

## 2021-02-28 NOTE — Progress Notes (Signed)
Kristen [name]  Date of Visit: 02/28/2021  History of Present Illness: Kristen [name] is a 46 y.o. female scheduled today for a post-operative visit.   Surgery: s/p  Robotic assisted total laparoscopic hysterectomy, bilateral salpingectomy, mesh sacrocolpopexy, cystoscopy on 01/01/21  Has been feeling better overall since her last visit.  Has MRI pelvic which showed- IMPRESSION: 1. Signs of sacral colpopexy. 2. No evidence of osteomyelitis or abscess about the sacrum. 3. Granulation tissue mixed with small amounts of blood products versus is pseudomass from inflammation are considered. In the absence of any history of malignancy neoplasm is felt unlikely. CT may be helpful for better delineation due to susceptibility artifact from adjacent bowel and surgical clips. There is no defined fluid collection by MRI.  She took antibiotics for cuff cellulitis. We discussed that the MRI was likely showing this inflammation at the cuff, also noted on vaginal exam. Bleeding has decreased. She occasionally will see some brown discharge, but very minimal. Bloating has improved but notices more bloating after eating. She is now taking Miralax twice per day and having regular bowel movements.   Medications: She has a current medication list which includes the following prescription(s): acetaminophen, acyclovir, albuterol, amphetamine-dextroamphetamine, budesonide-formoterol, cyanocobalamin, diazepam, enalapril, epinephrine, fluconazole, hydrocodone-acetaminophen, hydroxyzine, ibuprofen, lidocaine, liothyronine, magnesium, methylprednisolone, multivitamin with minerals, neomycin-polymyxin b-dexamethasone, nitrofurantoin (macrocrystal-monohydrate), blisovi 24 fe, ondansetron, polyethylene glycol powder, prednisone, and synthroid.   Allergies: Patient is allergic to chocolate, peanut-containing drug products, clindamycin/lincomycin, influenza vaccines, bystolic [nebivolol hcl], doxazosin, enalapril,  hydralazine, hydrochlorothiazide, losartan, spiractone [spironolactone], terazol [terconazole], and latex.   Physical Exam: BP (!) 190/129    Pulse 87   Abdomen: soft, non-tender, without masses or organomegaly Laparoscopic Incisions: healing well.  Pelvic Examination: Speculum exam shows minimal vaginal discharge.  Suture noted at the cuff, no erythema or discharge. No tenderness along the anterior or posterior vagina. No pelvic masses. No visible or palpable mesh.    Assessment and Plan:  1. Abdominal bloating   2. Other muscle spasm     - Well healed after surgery. Stitch is still present at cuff but this will dissolve eventually (can last up to 6 months).  - Can resume normal activity, including exercise and intercourse if desired. She can also return to work.  - We discussed pelvic physical therapy as she is having some muscle spasm. Has an appt with Dr Clydene Laming at [facility]. Referral placed for her.  - Recommended GI follow up to rule out other causes of bloating after eating. Referral placed.   Follow up 3 months or sooner if needed  Marguerita Beards, MD

## 2021-03-22 ENCOUNTER — Other Ambulatory Visit: Payer: Self-pay | Admitting: Family Medicine

## 2021-03-22 MED ORDER — AMPHETAMINE-DEXTROAMPHETAMINE 20 MG PO TABS: 20.0000 mg | ORAL_TABLET | Freq: Two times a day (BID) | ORAL | 0 refills | Status: DC

## 2021-03-22 NOTE — Telephone Encounter (Signed)
Requesting: Adderall 20mg   ?Contract: 06/18/20 ?UDS: 01/28/2021 ?Last Visit: 01/28/2021 ?Next Visit: 07/29/2021 ?Last Refill: 01/11/2021 #60 and 0RF ? ?Please Advise ? ?

## 2021-04-01 ENCOUNTER — Encounter (HOSPITAL_BASED_OUTPATIENT_CLINIC_OR_DEPARTMENT_OTHER): Payer: Self-pay | Admitting: Cardiovascular Disease

## 2021-04-02 ENCOUNTER — Telehealth: Payer: Self-pay

## 2021-04-02 DIAGNOSIS — Z0000 Encounter for general adult medical examination without abnormal findings: Secondary | ICD-10-CM

## 2021-04-02 NOTE — Telephone Encounter (Signed)
Called patient to inform her that since she has completed the [employer] program she needs to return the device. Patient has cancelled her appointment with Dr. Duke Salvia for 3/22 and will not be able to return the device at this time. Patient did not answer. Left voicemail regarding the return of the device and left my contact information if she were to have questions.  ? ? ?[name], CHWC ?[facility] ?Care Guide, Health Coach ?[address] ?[place] [address] ?Telephone: [phone] ?Email: [email or web address] ? ?

## 2021-04-03 ENCOUNTER — Ambulatory Visit (HOSPITAL_BASED_OUTPATIENT_CLINIC_OR_DEPARTMENT_OTHER): Payer: BC Managed Care – PPO | Admitting: Cardiovascular Disease

## 2021-04-24 ENCOUNTER — Other Ambulatory Visit: Payer: Self-pay | Admitting: Family Medicine

## 2021-05-27 DIAGNOSIS — M7062 Trochanteric bursitis, left hip: Secondary | ICD-10-CM | POA: Diagnosis not present

## 2021-05-27 NOTE — Progress Notes (Deleted)
Subjective:    Patient ID: Kristen [name], female    DOB: 1975-03-25, 46 y.o.   MRN: [id number]  No chief complaint on file.   HPI Patient is in today for a follow up.  Past Medical History:  Diagnosis Date   Abnormal cervical cytology 09/26/2011   LGSIL in 2011, patient reports repeat pap was normal, has had intermittent abnormals with bx in past but always normalized, has had cryotherapy with good results Menarche at 57, irregular without birth control at times, very heavy LMP 08/26/2011 No gyn concerns, MGM last year normal    ADD (attention deficit disorder)    Allergy    seasonal   Anemia    hx of none recent per pt on 12-27-2020   Asthma    mild, intermittent   Chicken pox as a child   Claustrophobia 12/27/2020   Concussion 09/24/2010   DJD (degenerative joint disease)    lower back   Family history of heart disease    Female bladder prolapse, acquired 09/24/2010   Fibromyalgia    Genital herpes 12/27/2020   Headache 09/26/2013   History of concussion 09/24/2010   headaches  x 8 months  and  memeory problems for months no current residual from   History of kidney stones    History of migraines 09/24/2010   History of postpartum depression 2002   HSV-2 infection 09/24/2010   HTN (hypertension) 09/24/2010   Hyperlipidemia    Hypertension    Hypertriglyceridemia 08/11/2019   Hypothyroidism    Interstitial cystitis 02/18/2011   Left shoulder pain 05/27/2016   Migraine 09/24/2010   Migraine aura, persistent, with cerebral infarct, status over 72 hours 09/24/2010   Myalgia 06/08/2018   Neck pain, musculoskeletal 12/16/2010   oa  and djd no limited neck motion   OSA (obstructive sleep apnea) 08/28/2020   using auto set 6 to 17   Overweight 05/29/2015   Pharyngitis    sore throat and hurts when yawns since nov t & s surgery is improving   Pneumonia    last time 2012   Poor concentration 12/16/2010   Recurrent UTI    last uti 1st of december   Self-catheterizes  urinary bladder 12/27/2020   prn   Subacute and chronic vaginitis 09/24/2010   TIA (transient ischemic attack) 2015   saw dr berry none since   Tick bite 12/04/2013   rocky mountain spotted fever x 3 months last time 2015   Urinary incontinence    weras pads   Vaginitis and vulvovaginitis 05/21/2014   Wears glasses    or contacts    Past Surgical History:  Procedure Laterality Date   ABDOMINAL SACROCOLPOPEXY N/A 12/31/2020   Procedure: ABDOMINO SACROCOLPOPEXY;  Surgeon: Jaquita Folds, MD;  Location: [facility];  Service: Gynecology;  Laterality: N/A;   ANTERIOR AND POSTERIOR REPAIR  2003   colonscopy  2017   polyps removed due now   CYSTOSCOPY N/A 12/31/2020   Procedure: CYSTOSCOPY;  Surgeon: Jaquita Folds, MD;  Location: [facility];  Service: Gynecology;  Laterality: N/A;   INCONTINENCE SURGERY  01/13/2001   ROBOTIC ASSISTED TOTAL HYSTERECTOMY N/A 12/31/2020   Procedure: XI ROBOTIC ASSISTED TOTAL HYSTERECTOMY with bilateral salpingectomy;  Surgeon: Jaquita Folds, MD;  Location: [facility];  Service: Gynecology;  Laterality: N/A;   TONSILLECTOMY  11/15/2020   and adenoids removed due to constant infections at [facility] [place]    Family History  Problem  Relation Age of Onset   Osteoporosis Mother    Hyperlipidemia Mother    Kidney disease Father    Heart attack Father    Hyperlipidemia Father    Hypertension Father    Alcohol abuse Father    Heart disease Father    Heart attack Brother 26   Hypertension Brother    Heart disease Brother 8       MI   Hyperlipidemia Brother    Heart disease Maternal Grandmother    Heart failure Maternal Grandmother    Valvular heart disease Maternal Grandmother    Dementia Maternal Grandfather 92       early stages   Stroke Maternal Grandfather    Heart failure Maternal Grandfather    Heart attack Paternal Grandfather    Stroke Paternal Grandfather     Heart disease Paternal Grandfather    Alcohol abuse Paternal Grandfather    Hyperlipidemia Paternal Grandfather    Hypertension Paternal Grandfather     Social History   Socioeconomic History   Marital status: Married    Spouse name: Not on file   Number of children: Not on file   Years of education: Not on file   Highest education level: Not on file  Occupational History   Not on file  Tobacco Use   Smoking status: Never   Smokeless tobacco: Never  Vaping Use   Vaping Use: Never used  Substance and Sexual Activity   Alcohol use: No    Alcohol/week: 0.0 standard drinks    Comment: 0   Drug use: No   Sexual activity: Yes    Partners: Male    Birth control/protection: Pill  Other Topics Concern   Not on file  Social History Narrative   Lives with husband 2 children in a one story home.     Works as a Geophysicist/field seismologist.     Education: college.   Social Determinants of Health   Financial Resource Strain: Not on file  Food Insecurity: Not on file  Transportation Needs: Not on file  Physical Activity: Not on file  Stress: Not on file  Social Connections: Not on file  Intimate Partner Violence: Not on file    Outpatient Medications Prior to Visit  Medication Sig Dispense Refill   acetaminophen (TYLENOL) 500 MG tablet Take 1 tablet (500 mg total) by mouth every 6 (six) hours as needed (pain). (Patient taking differently: Take 500 mg by mouth every 6 (six) hours as needed (pain). For after sugery) 30 tablet 0   acyclovir (ZOVIRAX) 200 MG capsule TAKE 1 CAPSULE BY MOUTH TWICE A DAY INS LIMIT OF 30 DAYS 60 capsule 11   albuterol (VENTOLIN HFA) 108 (90 Base) MCG/ACT inhaler Inhale 2 puffs into the lungs every 6 (six) hours as needed. 1 each 3   amphetamine-dextroamphetamine (ADDERALL) 20 MG tablet Take 1 tablet (20 mg total) by mouth 2 (two) times daily. 60 tablet 0   budesonide-formoterol (SYMBICORT) 160-4.5 MCG/ACT inhaler TAKE 2 PUFFS BY MOUTH TWICE A DAY 10.2 each 5    Cyanocobalamin (VITAMIN B 12 PO) Take by mouth. Vitamin b 12 subligual daily     diazepam (VALIUM) 5 MG tablet Place 1 tablet vaginally nightly as needed for muscle spasm/ pelvic pain. 1 tablet 0   enalapril (VASOTEC) 10 MG tablet Take 10 mg (one tablet) in the morning.Take 5 mg (one-half tablet) in the evening. 120 tablet 3   EPINEPHrine 0.3 mg/0.3 mL IJ SOAJ injection Inject 0.3 mg into the muscle as  needed for anaphylaxis. 2 each 2   fluconazole (DIFLUCAN) 150 MG tablet Take 1 tablet (150 mg total) by mouth once a week. Takes prn 2 tablet 2   HYDROcodone-acetaminophen (NORCO/VICODIN) 5-325 MG tablet Take 1 tablet by mouth every 6 (six) hours as needed for moderate pain. 90 tablet 0   hydrOXYzine (ATARAX) 50 MG tablet Take 1 tablet (50 mg total) by mouth every 6 (six) hours as needed for anxiety. 5 tablet 0   ibuprofen (ADVIL) 600 MG tablet Take 1 tablet (600 mg total) by mouth every 6 (six) hours as needed. (Patient taking differently: Take 600 mg by mouth every 6 (six) hours as needed. For after surgery) 30 tablet 0   lidocaine (XYLOCAINE) 5 % ointment Apply 1 application topically daily as needed. 50 g 1   liothyronine (CYTOMEL) 5 MCG tablet TAKE 1 TABLET BY MOUTH EVERY DAY 90 tablet 1   MAGNESIUM PO Take by mouth. Otc daily     methylPREDNISolone (MEDROL) 4 MG tablet 5 tabs po x 1 day then 4 tabs po x 1 day then 3 tabs po x 1 day then 2 tabs po x 1 day then 1 tab po x 1 day and stop 15 tablet 0   Multiple Vitamins-Minerals (MULTIVITAMIN WITH MINERALS) tablet Take 1 tablet by mouth daily.     neomycin-polymyxin b-dexamethasone (MAXITROL) 3.5-10000-0.1 SUSP Place 1 drop into both eyes in the morning, at noon, in the evening, and at bedtime. 5 mL 1   nitrofurantoin, macrocrystal-monohydrate, (MACROBID) 100 MG capsule TAKE 1 CAPSULE FOR 1 DOSE AS NEEDED FOR PREVENTION OF UTI *AFTER SELF CATH, SWIMMING, HOT TUB, SEX* 30 capsule 8   Norethindrone Acetate-Ethinyl Estrad-FE (BLISOVI 24 FE) 1-20  MG-MCG(24) tablet Take 1 tablet by mouth daily. 28 tablet 11   ondansetron (ZOFRAN) 4 MG tablet Take 1 tablet (4 mg total) by mouth every 8 (eight) hours as needed for nausea or vomiting. (Patient taking differently: Take 4 mg by mouth every 8 (eight) hours as needed for nausea or vomiting. For after surgery) 10 tablet 0   polyethylene glycol powder (GLYCOLAX/MIRALAX) 17 GM/SCOOP powder Take 17 g by mouth daily. Drink 17g (1 scoop) dissolved in water per day. (Patient taking differently: Take 17 g by mouth daily. Drink 17g (1 scoop) dissolved in water per day. For after surgery) 255 g 0   predniSONE (DELTASONE) 20 MG tablet Take 3 tablets (60 mg total) by mouth daily with breakfast. 30 tablet 0   SYNTHROID 137 MCG tablet Take 1 tablet (137 mcg total) by mouth daily. 90 tablet 1   No facility-administered medications prior to visit.    Allergies  Allergen Reactions   Chocolate Anaphylaxis    Trouble breathing facial swelling   Peanut-Containing Drug Products Anaphylaxis    All nuts of every kind   Clindamycin/Lincomycin Nausea And Vomiting    And diarrhea.    Influenza Vaccines Rash    Significant swelling in arm, rash, malaise, myalgias    Bystolic [Nebivolol Hcl]     Fatigue arm heaviness   Doxazosin     Would not take due to side effects listed in patient information   Enalapril     Swelling at higher doses fatigue   Hydralazine     Urinary retention and edema    Hydrochlorothiazide     Bladder issues and kidney stones   Losartan     Did not work   [employer] [Spironolactone]     Leg cramps   Terazol [Terconazole]  Burning sensation   Latex Rash    ROS     Objective:    Physical Exam  There were no vitals taken for this visit. Wt Readings from Last 3 Encounters:  01/28/21 162 lb (73.5 kg)  12/31/20 157 lb 6.4 oz (71.4 kg)  12/27/20 150 lb (68 kg)    Diabetic Foot Exam - Simple   No data filed    Lab Results  Component Value Date   WBC 14.8 (H) 02/11/2021    HGB 14.4 02/11/2021   HCT 43.8 02/11/2021   PLT 209.0 02/11/2021   GLUCOSE 92 01/28/2021   CHOL 340 (H) 01/28/2021   TRIG (H) 01/28/2021    666.0 Triglyceride is over 400; calculations on Lipids are invalid.   HDL 37.40 (L) 01/28/2021   LDLDIRECT 170.0 01/28/2021   LDLCALC 136 (H) 11/22/2012   ALT 22 01/28/2021   AST 21 01/28/2021   NA 137 01/28/2021   K 3.6 01/28/2021   CL 97 01/28/2021   CREATININE 0.70 01/28/2021   BUN 12 01/28/2021   CO2 31 01/28/2021   TSH 1.37 01/28/2021   INR 1.09 05/23/2015    Lab Results  Component Value Date   TSH 1.37 01/28/2021   Lab Results  Component Value Date   WBC 14.8 (H) 02/11/2021   HGB 14.4 02/11/2021   HCT 43.8 02/11/2021   MCV 91.6 02/11/2021   PLT 209.0 02/11/2021   Lab Results  Component Value Date   NA 137 01/28/2021   K 3.6 01/28/2021   CO2 31 01/28/2021   GLUCOSE 92 01/28/2021   BUN 12 01/28/2021   CREATININE 0.70 01/28/2021   BILITOT 0.6 01/28/2021   ALKPHOS 85 01/28/2021   AST 21 01/28/2021   ALT 22 01/28/2021   PROT 7.2 01/28/2021   ALBUMIN 4.4 01/28/2021   CALCIUM 9.6 01/28/2021   ANIONGAP 8 12/27/2020   EGFR 102 04/09/2020   GFR 104.68 01/28/2021   Lab Results  Component Value Date   CHOL 340 (H) 01/28/2021   Lab Results  Component Value Date   HDL 37.40 (L) 01/28/2021   Lab Results  Component Value Date   LDLCALC 136 (H) 11/22/2012   Lab Results  Component Value Date   TRIG (H) 01/28/2021    666.0 Triglyceride is over 400; calculations on Lipids are invalid.   Lab Results  Component Value Date   CHOLHDL 9 01/28/2021   No results found for: HGBA1C     Assessment & Plan:   Problem List Items Addressed This Visit   None   I am having Casey maintain her neomycin-polymyxin b-dexamethasone, nitrofurantoin (macrocrystal-monohydrate), enalapril, acetaminophen, ibuprofen, polyethylene glycol powder, ondansetron, multivitamin with minerals, MAGNESIUM PO, Cyanocobalamin (VITAMIN B 12  PO), fluconazole, acyclovir, Blisovi 24 Fe, budesonide-formoterol, Synthroid, EPINEPHrine, albuterol, HYDROcodone-acetaminophen, lidocaine, methylPREDNISolone, predniSONE, diazepam, hydrOXYzine, amphetamine-dextroamphetamine, and liothyronine.  No orders of the defined types were placed in this encounter.

## 2021-05-28 ENCOUNTER — Ambulatory Visit: Payer: BC Managed Care – PPO | Admitting: Family Medicine

## 2021-05-28 DIAGNOSIS — I10 Essential (primary) hypertension: Secondary | ICD-10-CM

## 2021-05-28 DIAGNOSIS — E782 Mixed hyperlipidemia: Secondary | ICD-10-CM

## 2021-05-28 DIAGNOSIS — E039 Hypothyroidism, unspecified: Secondary | ICD-10-CM

## 2021-05-28 DIAGNOSIS — D649 Anemia, unspecified: Secondary | ICD-10-CM

## 2021-05-29 ENCOUNTER — Ambulatory Visit: Payer: BC Managed Care – PPO | Admitting: Obstetrics and Gynecology

## 2021-05-30 ENCOUNTER — Telehealth: Payer: Self-pay | Admitting: Family Medicine

## 2021-05-30 ENCOUNTER — Encounter: Payer: Self-pay | Admitting: Obstetrics and Gynecology

## 2021-05-30 ENCOUNTER — Ambulatory Visit (INDEPENDENT_AMBULATORY_CARE_PROVIDER_SITE_OTHER): Payer: BC Managed Care – PPO | Admitting: Obstetrics and Gynecology

## 2021-05-30 VITALS — BP 175/126 | HR 85

## 2021-05-30 DIAGNOSIS — K5904 Chronic idiopathic constipation: Secondary | ICD-10-CM | POA: Diagnosis not present

## 2021-05-30 NOTE — Progress Notes (Signed)
[facility] Return Visit  SUBJECTIVE  History of Present Illness: Kristen [name] is a 46 y.o. female seen in follow-up. She is s/p  Robotic assisted total laparoscopic hysterectomy, bilateral salpingectomy, mesh sacrocolpopexy, cystoscopy on 01/01/21.  Has needed to take Inno clease to have daily bowel movements (contains Senna). Sometimes will have bladder spasm but does not have bladder leakage. Sometimes has to rush to the bathroom. Has IC and mostly under control. Has not noticed any pain. Denies vaginal bleeding or discharge.   She has completed PT. Feels some pain in her groin/ hip area sometimes, especially after work. She is doing muscular work to activate glute med.    Past Medical History: Patient  has a past medical history of Abnormal cervical cytology (09/26/2011), ADD (attention deficit disorder), Allergy, Anemia, Asthma, Chicken pox (as a child), Claustrophobia (12/27/2020), Concussion (09/24/2010), DJD (degenerative joint disease), Family history of heart disease, Female bladder prolapse, acquired (09/24/2010), Fibromyalgia, Genital herpes (12/27/2020), Headache (09/26/2013), History of concussion (09/24/2010), History of kidney stones, History of migraines (09/24/2010), History of postpartum depression (2002), HSV-2 infection (09/24/2010), HTN (hypertension) (09/24/2010), Hyperlipidemia, Hypertension, Hypertriglyceridemia (08/11/2019), Hypothyroidism, Interstitial cystitis (02/18/2011), Left shoulder pain (05/27/2016), Migraine (09/24/2010), Migraine aura, persistent, with cerebral infarct, status over 72 hours (09/24/2010), Myalgia (06/08/2018), Neck pain, musculoskeletal (12/16/2010), OSA (obstructive sleep apnea) (08/28/2020), Overweight (05/29/2015), Pharyngitis, Pneumonia, Poor concentration (12/16/2010), Recurrent UTI, Self-catheterizes urinary bladder (12/27/2020), Subacute and chronic vaginitis (09/24/2010), TIA (transient ischemic attack) (2015), Tick bite  (12/04/2013), Urinary incontinence, Vaginitis and vulvovaginitis (05/21/2014), and Wears glasses.   Past Surgical History: She  has a past surgical history that includes Incontinence surgery (01/13/2001); Anterior and posterior repair (2003); Tonsillectomy (11/15/2020); colonscopy (2017); Robotic assisted total hysterectomy (N/A, 12/31/2020); Abdominal sacrocolpopexy (N/A, 12/31/2020); and Cystoscopy (N/A, 12/31/2020).   Medications: She has a current medication list which includes the following prescription(s): acetaminophen, acyclovir, albuterol, amphetamine-dextroamphetamine, budesonide-formoterol, cyanocobalamin, diazepam, enalapril, epinephrine, fluconazole, hydrocodone-acetaminophen, hydroxyzine, ibuprofen, lidocaine, liothyronine, magnesium, methylprednisolone, multivitamin with minerals, neomycin-polymyxin b-dexamethasone, nitrofurantoin (macrocrystal-monohydrate), blisovi 24 fe, ondansetron, polyethylene glycol powder, prednisone, and synthroid.   Allergies: Patient is allergic to chocolate, peanut-containing drug products, clindamycin/lincomycin, influenza vaccines, bystolic [nebivolol hcl], doxazosin, enalapril, hydralazine, hydrochlorothiazide, losartan, spiractone [spironolactone], terazol [terconazole], and latex.   Social History: Patient  reports that she has never smoked. She has never used smokeless tobacco. She reports that she does not drink alcohol and does not use drugs.      OBJECTIVE     Physical Exam: Vitals:   05/30/21 0921  BP: (!) 175/126  Pulse: 85   Gen: No apparent distress, A&O x 3.  Detailed Urogynecologic Evaluation:  Deferred. Prior exam showed:  POP-Q: POP-Q   -3                                            Aa   -3                                           Ba   -7.5                                              C  3                                            Gh   4                                            Pb   8                                             tvl    -2                                            Ap   -2                                            Bp                                                  D      ASSESSMENT AND PLAN    Kristen [name] is a 13 y.o. with:  1. Chronic idiopathic constipation    - Overall has seen improvement in her pain symptoms and constipation.  - Has completed physical therapy, continues to do exercises at home.  - She will follow up with PCP regarding elevated BP today.   She will follow up as needed  Marguerita Beards, MD  Time spent: I spent 20 minutes dedicated to the care of this patient on the date of this encounter to include pre-visit review of records, face-to-face time with the patient and post visit documentation.

## 2021-05-30 NOTE — Telephone Encounter (Signed)
Resched pt's appointment with MO due to blyth being out of office. Pt declined triage and states she's okay

## 2021-06-04 ENCOUNTER — Ambulatory Visit (INDEPENDENT_AMBULATORY_CARE_PROVIDER_SITE_OTHER): Payer: BC Managed Care – PPO | Admitting: Family

## 2021-06-04 ENCOUNTER — Encounter: Payer: Self-pay | Admitting: Family

## 2021-06-04 VITALS — BP 174/110 | HR 84 | Temp 98.5°F | Resp 16 | Wt 158.0 lb

## 2021-06-04 DIAGNOSIS — M5412 Radiculopathy, cervical region: Secondary | ICD-10-CM | POA: Insufficient documentation

## 2021-06-04 DIAGNOSIS — I10 Essential (primary) hypertension: Secondary | ICD-10-CM | POA: Diagnosis not present

## 2021-06-04 LAB — BASIC METABOLIC PANEL
BUN: 14 mg/dL (ref 6–23)
CO2: 31 mEq/L (ref 19–32)
Calcium: 9.6 mg/dL (ref 8.4–10.5)
Chloride: 100 mEq/L (ref 96–112)
Creatinine, Ser: 0.76 mg/dL (ref 0.40–1.20)
GFR: 94.61 mL/min (ref >60.00)
Glucose, Bld: 94 mg/dL (ref 70–99)
Potassium: 3.9 mEq/L (ref 3.5–5.1)
Sodium: 136 mEq/L (ref 135–145)

## 2021-06-04 MED ORDER — CLONIDINE HCL 0.1 MG PO TABS: 0.1000 mg | ORAL_TABLET | Freq: Two times a day (BID) | ORAL | 2 refills | Status: DC

## 2021-06-04 MED ORDER — MELOXICAM 7.5 MG PO TABS: 7.5000 mg | ORAL_TABLET | Freq: Every day | ORAL | 0 refills | Status: DC

## 2021-06-04 MED ORDER — ALPRAZOLAM 0.5 MG PO TABS
ORAL_TABLET | ORAL | 0 refills | Status: DC
Start: 2021-06-04 — End: 2023-03-18

## 2021-06-04 NOTE — Assessment & Plan Note (Signed)
New. She does have chronic neck pain. Last neck imaging was 10 years ago.  Will rx with trial of meloxicam and update an x-ray of her c-spine.

## 2021-06-04 NOTE — Patient Instructions (Addendum)
Start meloxicam for neck pain/hand numbness.  Start clonidine 0.1mg  twice daily for blood pressure. You should be contacted about scheduling your renal ultrasound. Please schedule your follow up with Dr. Duke Salvia.  Complete neck x-ray on the first floor.

## 2021-06-04 NOTE — Assessment & Plan Note (Addendum)
BP Readings from Last 3 Encounters:  06/04/21 (!) 174/110  05/30/21 (!) 175/126  02/28/21 (!) 190/129   Repeat bp a bit better today but still high. She has had multiple drug intolerances which has complicated her treatment. She has not tried clonidine.  Will begin 0.1mg  bid.  Will also initiate a work up to evaluate for secondary causes of her severe HTN. She is requesting a refill of bystolic to have on hand in case bp runs very high. She states that she can tolerate for short periods of time, just causes fatigue/weight gain if she takes every day. She is advised to schedule a follow up visit with Dr. Duke Salvia  In the HTN clinic.

## 2021-06-04 NOTE — Progress Notes (Signed)
Subjective:   By signing my name below, I, Kristen [name], attest that this documentation has been prepared under the direction and in the presence of Kristen Alar NP, 06/04/2021   Patient ID: Kristen [name], female    DOB: 10/31/1975, 46 y.o.   MRN: [id number]  Chief Complaint  Patient presents with   Hypertension    Reports elevated BP at home," highest was 210/157"   Numbness    Complains of numbness on both hands    HPI Patient is in today for an office visit  Refills - She is requesting a refill of Bystolic and Xanax.   Blood Pressure -  She states that her blood pressure reading is ranging from 156/90-104 at home but she reports that her blood pressure has recently spiked. She believes that her blood pressure spiked when  her numbness in her neck begun. She states that levels are still elevated but not as bad. She wants to stabilize her blood pressure prior to going on vacation on 06/18/2021. She takes Bystolic which she reports helps relieve her symptoms but she has an allergic reaction when taking the medication. These reactions include asthma and body weakness. She states that she has not taken Carvedilol. She states that originally her Cpap was improving her blood pressure. She reports that she has not had a renal artery ultrasound. She states that she has had kidney and urinary problems for all of her life.  BP Readings from Last 3 Encounters:  06/04/21 (!) 174/110  05/30/21 (!) 175/126  02/28/21 (!) 190/129   Pulse Readings from Last 3 Encounters:  06/04/21 84  05/30/21 85  02/28/21 87   Numbness - She complains of pins and needle numbness in her hands. She states that the sensation has begun to spread up her arms. She states that sensation runs along her ulnar nerve. She feels the sensation primarily in her fingers to mid hand. She states that sensation has not affected her tight strength but has affected her ability to feel other people's tightness. She reports that  she previously had symptoms 2 or 3 times that happened for days. She states that she has constant neck pain but pain has worsened over the last several weeks. She had an adjustment and it initially helped improve symptoms. She had an additional adjustment on 05/29/2021 but reports that symptoms were not relieved. She also has done dry needling which she reports did not help. She states that she does not feel like these symptoms align with Carpal Tunnel.   Health Maintenance Due  Topic Date Due   COVID-19 Vaccine (1) Never done   TETANUS/TDAP  12/15/2020   PAP SMEAR-Modifier  03/17/2021    Past Medical History:  Diagnosis Date   Abnormal cervical cytology 09/26/2011   LGSIL in 2011, patient reports repeat pap was normal, has had intermittent abnormals with bx in past but always normalized, has had cryotherapy with good results Menarche at 43, irregular without birth control at times, very heavy LMP 08/26/2011 No gyn concerns, MGM last year normal    ADD (attention deficit disorder)    Allergy    seasonal   Anemia    hx of none recent per pt on 12-27-2020   Asthma    mild, intermittent   Chicken pox as a child   Claustrophobia 12/27/2020   Concussion 09/24/2010   DJD (degenerative joint disease)    lower back   Family history of heart disease    Female bladder prolapse, acquired  09/24/2010   Fibromyalgia    Genital herpes 12/27/2020   Headache 09/26/2013   History of concussion 09/24/2010   headaches  x 8 months  and  memeory problems for months no current residual from   History of kidney stones    History of migraines 09/24/2010   History of postpartum depression 2002   HSV-2 infection 09/24/2010   HTN (hypertension) 09/24/2010   Hyperlipidemia    Hypertension    Hypertriglyceridemia 08/11/2019   Hypothyroidism    Interstitial cystitis 02/18/2011   Left shoulder pain 05/27/2016   Migraine 09/24/2010   Migraine aura, persistent, with cerebral infarct, status over 72 hours  09/24/2010   Myalgia 06/08/2018   Neck pain, musculoskeletal 12/16/2010   oa  and djd no limited neck motion   OSA (obstructive sleep apnea) 08/28/2020   using auto set 6 to 17   Overweight 05/29/2015   Pharyngitis    sore throat and hurts when yawns since nov t & s surgery is improving   Pneumonia    last time 2012   Poor concentration 12/16/2010   Recurrent UTI    last uti 1st of december   Self-catheterizes urinary bladder 12/27/2020   prn   Subacute and chronic vaginitis 09/24/2010   TIA (transient ischemic attack) 2015   saw dr berry none since   Tick bite 12/04/2013   rocky mountain spotted fever x 3 months last time 2015   Urinary incontinence    weras pads   Vaginitis and vulvovaginitis 05/21/2014   Wears glasses    or contacts    Past Surgical History:  Procedure Laterality Date   ABDOMINAL SACROCOLPOPEXY N/A 12/31/2020   Procedure: ABDOMINO SACROCOLPOPEXY;  Surgeon: Marguerita BeardsSchroeder, Michelle N, MD;  [facility] [place];  Service: Gynecology;  Laterality: N/A;   ANTERIOR AND POSTERIOR REPAIR  2003   colonscopy  2017   polyps removed due now   CYSTOSCOPY N/A 12/31/2020   Procedure: CYSTOSCOPY;  Surgeon: Marguerita BeardsSchroeder, Michelle N, MD;  [facility] [place];  Service: Gynecology;  Laterality: N/A;   INCONTINENCE SURGERY  01/13/2001   ROBOTIC ASSISTED TOTAL HYSTERECTOMY N/A 12/31/2020   Procedure: XI ROBOTIC ASSISTED TOTAL HYSTERECTOMY with bilateral salpingectomy;  Surgeon: Marguerita BeardsSchroeder, Michelle N, MD;  [facility] [place];  Service: Gynecology;  Laterality: N/A;   TONSILLECTOMY  11/15/2020   and adenoids removed due to constant infections at sugical center of     Family History  Problem Relation Age of Onset   Osteoporosis Mother    Hyperlipidemia Mother    Kidney disease Father    Heart attack Father    Hyperlipidemia Father    Hypertension Father    Alcohol abuse Father    Heart disease Father    Heart  attack Brother [id number]   Hypertension Brother    Heart disease Brother [id number]       MI   Hyperlipidemia Brother    Heart disease Maternal Grandmother    Heart failure Maternal Grandmother    Valvular heart disease Maternal Grandmother    Dementia Maternal Grandfather [id number]       early stages   Stroke Maternal Grandfather    Heart failure Maternal Grandfather    Heart attack Paternal Grandfather    Stroke Paternal Grandfather    Heart disease Paternal Grandfather    Alcohol abuse Paternal Grandfather    Hyperlipidemia Paternal Grandfather    Hypertension Paternal Grandfather     Social History   Socioeconomic History  Marital status: Married    Spouse name: Not on file   Number of children: Not on file   Years of education: Not on file   Highest education level: Not on file  Occupational History   Not on file  Tobacco Use   Smoking status: Never   Smokeless tobacco: Never  Vaping Use   Vaping Use: Never used  Substance and Sexual Activity   Alcohol use: No    Alcohol/week: 0.0 standard drinks    Comment: 0   Drug use: No   Sexual activity: Yes    Partners: Male    Birth control/protection: Pill  Other Topics Concern   Not on file  Social History Narrative   Lives with husband 2 children in a one story home.     Works as a Geophysicist/field seismologist.     Education: college.   Social Determinants of Health   Financial Resource Strain: Not on file  Food Insecurity: Not on file  Transportation Needs: Not on file  Physical Activity: Not on file  Stress: Not on file  Social Connections: Not on file  Intimate Partner Violence: Not on file    Outpatient Medications Prior to Visit  Medication Sig Dispense Refill   acetaminophen (TYLENOL) 500 MG tablet Take 1 tablet (500 mg total) by mouth every 6 (six) hours as needed (pain). (Patient taking differently: Take 500 mg by mouth every 6 (six) hours as needed (pain). For after sugery) 30 tablet 0   acyclovir (ZOVIRAX) 200 MG capsule TAKE  1 CAPSULE BY MOUTH TWICE A DAY INS LIMIT OF 30 DAYS 60 capsule 11   albuterol (VENTOLIN HFA) 108 (90 Base) MCG/ACT inhaler Inhale 2 puffs into the lungs every 6 (six) hours as needed. 1 each 3   amphetamine-dextroamphetamine (ADDERALL) 20 MG tablet Take 1 tablet (20 mg total) by mouth 2 (two) times daily. 60 tablet 0   budesonide-formoterol (SYMBICORT) 160-4.5 MCG/ACT inhaler TAKE 2 PUFFS BY MOUTH TWICE A DAY 10.2 each 5   Cyanocobalamin (VITAMIN B 12 PO) Take by mouth. Vitamin b 12 subligual daily     diazepam (VALIUM) 5 MG tablet Place 1 tablet vaginally nightly as needed for muscle spasm/ pelvic pain. 1 tablet 0   enalapril (VASOTEC) 10 MG tablet Take 10 mg (one tablet) in the morning.Take 5 mg (one-half tablet) in the evening. 120 tablet 3   EPINEPHrine 0.3 mg/0.3 mL IJ SOAJ injection Inject 0.3 mg into the muscle as needed for anaphylaxis. 2 each 2   fluconazole (DIFLUCAN) 150 MG tablet Take 1 tablet (150 mg total) by mouth once a week. Takes prn 2 tablet 2   HYDROcodone-acetaminophen (NORCO/VICODIN) 5-325 MG tablet Take 1 tablet by mouth every 6 (six) hours as needed for moderate pain. 90 tablet 0   hydrOXYzine (ATARAX) 50 MG tablet Take 1 tablet (50 mg total) by mouth every 6 (six) hours as needed for anxiety. 5 tablet 0   ibuprofen (ADVIL) 600 MG tablet Take 1 tablet (600 mg total) by mouth every 6 (six) hours as needed. (Patient taking differently: Take 600 mg by mouth every 6 (six) hours as needed. For after surgery) 30 tablet 0   lidocaine (XYLOCAINE) 5 % ointment Apply 1 application topically daily as needed. 50 g 1   liothyronine (CYTOMEL) 5 MCG tablet TAKE 1 TABLET BY MOUTH EVERY DAY 90 tablet 1   MAGNESIUM PO Take by mouth. Otc daily     Multiple Vitamins-Minerals (MULTIVITAMIN WITH MINERALS) tablet Take 1 tablet by  mouth daily.     neomycin-polymyxin b-dexamethasone (MAXITROL) 3.5-10000-0.1 SUSP Place 1 drop into both eyes in the morning, at noon, in the evening, and at bedtime. 5 mL  1   nitrofurantoin, macrocrystal-monohydrate, (MACROBID) 100 MG capsule TAKE 1 CAPSULE FOR 1 DOSE AS NEEDED FOR PREVENTION OF UTI *AFTER SELF CATH, SWIMMING, HOT TUB, SEX* 30 capsule 8   Norethindrone Acetate-Ethinyl Estrad-FE (BLISOVI 24 FE) 1-20 MG-MCG(24) tablet Take 1 tablet by mouth daily. 28 tablet 11   ondansetron (ZOFRAN) 4 MG tablet Take 1 tablet (4 mg total) by mouth every 8 (eight) hours as needed for nausea or vomiting. (Patient taking differently: Take 4 mg by mouth every 8 (eight) hours as needed for nausea or vomiting. For after surgery) 10 tablet 0   polyethylene glycol powder (GLYCOLAX/MIRALAX) 17 GM/SCOOP powder Take 17 g by mouth daily. Drink 17g (1 scoop) dissolved in water per day. (Patient taking differently: Take 17 g by mouth daily. Drink 17g (1 scoop) dissolved in water per day. For after surgery) 255 g 0   SYNTHROID 137 MCG tablet Take 1 tablet (137 mcg total) by mouth daily. 90 tablet 1   methylPREDNISolone (MEDROL) 4 MG tablet 5 tabs po x 1 day then 4 tabs po x 1 day then 3 tabs po x 1 day then 2 tabs po x 1 day then 1 tab po x 1 day and stop 15 tablet 0   predniSONE (DELTASONE) 20 MG tablet Take 3 tablets (60 mg total) by mouth daily with breakfast. 30 tablet 0   No facility-administered medications prior to visit.    Allergies  Allergen Reactions   Chocolate Anaphylaxis    Trouble breathing facial swelling   Peanut-Containing Drug Products Anaphylaxis    All nuts of every kind   Clindamycin/Lincomycin Nausea And Vomiting    And diarrhea.    Influenza Vaccines Rash    Significant swelling in arm, rash, malaise, myalgias    Bystolic [Nebivolol Hcl]     Fatigue arm heaviness   Doxazosin     Would not take due to side effects listed in patient information   Enalapril     Swelling at higher doses fatigue   Hydralazine     Urinary retention and edema    Hydrochlorothiazide     Bladder issues and kidney stones   Losartan     Did not work   Company secretary  [Spironolactone]     Leg cramps   Terazol [Terconazole]     Burning sensation   Latex Rash    Review of Systems  Neurological:  Positive for tingling (Hands and Bilateral Arms).      Objective:    Physical Exam Constitutional:      General: She is not in acute distress.    Appearance: Normal appearance. She is not ill-appearing.  HENT:     Head: Normocephalic and atraumatic.     Right Ear: External ear normal.     Left Ear: External ear normal.  Eyes:     Extraocular Movements: Extraocular movements intact.     Pupils: Pupils are equal, round, and reactive to light.  Cardiovascular:     Rate and Rhythm: Normal rate and regular rhythm.     Heart sounds: Normal heart sounds. No murmur heard.   No gallop.  Pulmonary:     Effort: Pulmonary effort is normal. No respiratory distress.     Breath sounds: Normal breath sounds. No wheezing or rales.  Musculoskeletal:     Comments: 5/5  strength in both upper and lower extremities    Skin:    General: Skin is warm and dry.  Neurological:     Mental Status: She is alert and oriented to person, place, and time.     Deep Tendon Reflexes:     Reflex Scores:      Bicep reflexes are 2+ on the right side and 2+ on the left side.      Brachioradialis reflexes are 2+ on the right side and 2+ on the left side.      Patellar reflexes are 2+ on the right side and 2+ on the left side. Psychiatric:        Mood and Affect: Mood normal.        Behavior: Behavior normal.        Judgment: Judgment normal.    BP (!) 174/110   Pulse 84   Temp 98.5 F (36.9 C) (Oral)   Resp 16   Wt 158 lb (71.7 kg)   SpO2 99%   BMI 28.90 kg/m  Wt Readings from Last 3 Encounters:  06/04/21 158 lb (71.7 kg)  01/28/21 162 lb (73.5 kg)  12/31/20 157 lb 6.4 oz (71.4 kg)       Assessment & Plan:   Problem List Items Addressed This Visit       Unprioritized   HTN (hypertension) - Primary    BP Readings from Last 3 Encounters:  06/04/21 (!) 174/110   05/30/21 (!) 175/126  02/28/21 (!) 190/129  Repeat bp a bit better today but still high. She has had multiple drug intolerances which has complicated her treatment. She has not tried clonidine.  Will begin 0.1mg  bid.  Will also initiate a work up to evaluate for secondary causes of her severe HTN. She is requesting a refill of bystolic to have on hand in case bp runs very high. She states that she can tolerate for short periods of time, just causes fatigue/weight gain if she takes every day. She is advised to schedule a follow up visit with Dr. Oval Linsey  In the HTN clinic.       Relevant Medications   cloNIDine (CATAPRES) 0.1 MG tablet   nebivolol (BYSTOLIC) 5 MG tablet   Other Relevant Orders   VAS US RENAL ARTERY DUPLEX   Basic metabolic panel (Completed)   Catecholamine+VMA, 24-Hr Urine   Cervical radiculopathy    New. She does have chronic neck pain. Last neck imaging was 10 years ago.  Will rx with trial of meloxicam and update an x-ray of her c-spine.        Relevant Medications   meloxicam (MOBIC) 7.5 MG tablet   ALPRAZolam (XANAX) 0.5 MG tablet   Other Relevant Orders   DG Cervical Spine Complete   43 minutes spent on today's visit. The majority of the time was spent interviewing patient and reviewing prior treatments/side effects.   Meds ordered this encounter  Medications   cloNIDine (CATAPRES) 0.1 MG tablet    Sig: Take 1 tablet (0.1 mg total) by mouth 2 (two) times daily.    Dispense:  80 tablet    Refill:  2    Order Specific Question:   Supervising Provider    Answer:   [name] [[id number]]   meloxicam (MOBIC) 7.5 MG tablet    Sig: Take 1 tablet (7.5 mg total) by mouth daily.    Dispense:  14 tablet    Refill:  0    Order Specific Question:  Supervising Provider    Answer:   [name] [[id number]]   ALPRAZolam (XANAX) 0.5 MG tablet    Sig: 1 tablet by mouth 30 minutes prior to flight.    Dispense:  4 tablet    Refill:  0    Order Specific Question:    Supervising Provider    Answer:   [name] [[id number]]   nebivolol (BYSTOLIC) 5 MG tablet    Sig: 1 tablet by mouth once daily as needed for systolic blood pressure [id number].    Dispense:  30 tablet    Refill:  3    Order Specific Question:   Supervising Provider    Answer:   [name] [[id number]]    I, Nance Pear, NP, personally preformed the services described in this documentation.  All medical record entries made by the scribe were at my direction and in my presence.  I have reviewed the chart and discharge instructions (if applicable) and agree that the record reflects my personal performance and is accurate and complete. 06/04/2021   I,Amber Collins,acting as a scribe for Nance Pear, NP.,have documented all relevant documentation on the behalf of Nance Pear, NP,as directed by  Nance Pear, NP while in the presence of Nance Pear, NP.    Nance Pear, NP

## 2021-06-05 ENCOUNTER — Other Ambulatory Visit: Payer: Self-pay

## 2021-06-05 DIAGNOSIS — D72829 Elevated white blood cell count, unspecified: Secondary | ICD-10-CM

## 2021-06-05 MED ORDER — NEBIVOLOL HCL 5 MG PO TABS: ORAL_TABLET | ORAL | 3 refills | Status: AC

## 2021-06-05 NOTE — Telephone Encounter (Signed)
Called left voicemail for patient to contact office to schedule a lab appointment. Patient was seen in office on 06/04/2021 no lavender top was collected only gold top could not do add on for cbc with diff

## 2021-06-05 NOTE — Addendum Note (Signed)
Addended by: Sandford Craze on: 06/05/2021 07:39 AM   Modules accepted: Orders

## 2021-06-10 DIAGNOSIS — I10 Essential (primary) hypertension: Secondary | ICD-10-CM | POA: Diagnosis not present

## 2021-06-11 ENCOUNTER — Other Ambulatory Visit: Payer: Self-pay | Admitting: Family Medicine

## 2021-06-11 ENCOUNTER — Ambulatory Visit (HOSPITAL_BASED_OUTPATIENT_CLINIC_OR_DEPARTMENT_OTHER)
Admission: RE | Admit: 2021-06-11 | Discharge: 2021-06-11 | Disposition: A | Discharge status: Home / Self Care | Payer: BC Managed Care – PPO | Source: Ambulatory Visit | Attending: Family | Admitting: Family

## 2021-06-11 ENCOUNTER — Other Ambulatory Visit (INDEPENDENT_AMBULATORY_CARE_PROVIDER_SITE_OTHER): Payer: BC Managed Care – PPO

## 2021-06-11 ENCOUNTER — Other Ambulatory Visit: Payer: Self-pay | Admitting: *Deleted

## 2021-06-11 ENCOUNTER — Encounter: Payer: Self-pay | Admitting: Family Medicine

## 2021-06-11 DIAGNOSIS — D72829 Elevated white blood cell count, unspecified: Secondary | ICD-10-CM

## 2021-06-11 DIAGNOSIS — M5412 Radiculopathy, cervical region: Secondary | ICD-10-CM | POA: Diagnosis not present

## 2021-06-11 DIAGNOSIS — I10 Essential (primary) hypertension: Secondary | ICD-10-CM

## 2021-06-11 LAB — CBC WITH DIFFERENTIAL/PLATELET
Basophils Absolute: 0 10*3/uL (ref 0.0–0.1)
Basophils Relative: 0.4 % (ref 0.0–3.0)
Eosinophils Absolute: 0.1 10*3/uL (ref 0.0–0.7)
Eosinophils Relative: 1 % (ref 0.0–5.0)
HCT: 44.1 % (ref 36.0–46.0)
Hemoglobin: 14.4 g/dL (ref 12.0–15.0)
Lymphocytes Relative: 33.4 % (ref 12.0–46.0)
Lymphs Abs: 2.6 10*3/uL (ref 0.7–4.0)
MCHC: 32.6 g/dL (ref 30.0–36.0)
MCV: 92.6 fl (ref 78.0–100.0)
Monocytes Absolute: 0.6 10*3/uL (ref 0.1–1.0)
Monocytes Relative: 7.5 % (ref 3.0–12.0)
Neutro Abs: 4.5 10*3/uL (ref 1.4–7.7)
Neutrophils Relative %: 57.7 % (ref 43.0–77.0)
Platelets: 197 10*3/uL (ref 150.0–400.0)
RBC: 4.76 Mil/uL (ref 3.87–5.11)
RDW: 13.3 % (ref 11.5–15.5)
WBC: 7.8 10*3/uL (ref 4.0–10.5)

## 2021-06-11 NOTE — Telephone Encounter (Signed)
Requesting: Adderall 20mg   Contract: 06/18/20 UDS: 01/28/21 Last Visit: 06/04/21 Next Visit: 07/29/21 Last Refill: 03/22/21 #60 and 0RF  Please Advise

## 2021-06-12 MED ORDER — AMPHETAMINE-DEXTROAMPHETAMINE 20 MG PO TABS: 20.0000 mg | ORAL_TABLET | Freq: Two times a day (BID) | ORAL | 0 refills | Status: DC

## 2021-06-12 MED ORDER — ACYCLOVIR 200 MG PO CAPS
ORAL_CAPSULE | ORAL | 11 refills | Status: DC
Start: 2021-06-12 — End: 2022-06-12

## 2021-06-12 MED ORDER — SYNTHROID 137 MCG PO TABS: 137.0000 ug | ORAL_TABLET | Freq: Every day | ORAL | 1 refills | Status: DC

## 2021-06-12 MED ORDER — NITROFURANTOIN MONOHYD MACRO 100 MG PO CAPS: ORAL_CAPSULE | ORAL | 8 refills | Status: DC

## 2021-06-12 NOTE — Telephone Encounter (Signed)
Refills sent, pt notified. 

## 2021-06-14 ENCOUNTER — Encounter: Payer: Self-pay | Admitting: Obstetrics and Gynecology

## 2021-06-14 DIAGNOSIS — G4733 Obstructive sleep apnea (adult) (pediatric): Secondary | ICD-10-CM | POA: Diagnosis not present

## 2021-06-14 LAB — CATECHOLAMINE+VMA, 24-HR URINE
Dopamine , 24H Ur: 160 ug/24 hr (ref 0–510)
Dopamine, Rand Ur: 100 ug/L
Epinephrine, 24H Ur: 3 ug/24 hr (ref 0–20)
Epinephrine, Rand Ur: 2 ug/L
Norepinephrine, 24H Ur: 22 ug/24 hr (ref 0–135)
Norepinephrine, Rand Ur: 14 ug/L
VMA, 24H Ur Adult: 1.9 mg/24 hr (ref 0.0–7.5)
VMA, Urine: 1.2 mg/L

## 2021-06-21 ENCOUNTER — Telehealth: Payer: Self-pay | Admitting: Family Medicine

## 2021-06-21 ENCOUNTER — Encounter (HOSPITAL_COMMUNITY): Payer: Self-pay

## 2021-06-21 NOTE — Telephone Encounter (Signed)
Para March from [facility] imaging wanted to make Abner Greenspan aware that they have been trying to reach patient for a Vascular US Renal Artery Duplex and have been unable to contact her.

## 2021-06-21 NOTE — Telephone Encounter (Signed)
Called pt. No answer, left VM requesting pt contact [facility] regarding her ultrasound.

## 2021-07-26 NOTE — Progress Notes (Unsigned)
Subjective:    Patient ID: Kristen [name], female    DOB: Jul 21, 1975, 46 y.o.   MRN: [id number]  No chief complaint on file.   HPI Patient is in today for a follow up.  Past Medical History:  Diagnosis Date   Abnormal cervical cytology 09/26/2011   LGSIL in 2011, patient reports repeat pap was normal, has had intermittent abnormals with bx in past but always normalized, has had cryotherapy with good results Menarche at 39, irregular without birth control at times, very heavy LMP 08/26/2011 No gyn concerns, MGM last year normal    ADD (attention deficit disorder)    Allergy    seasonal   Anemia    hx of none recent per pt on 12-27-2020   Asthma    mild, intermittent   Chicken pox as a child   Claustrophobia 12/27/2020   Concussion 09/24/2010   DJD (degenerative joint disease)    lower back   Family history of heart disease    Female bladder prolapse, acquired 09/24/2010   Fibromyalgia    Genital herpes 12/27/2020   Headache 09/26/2013   History of concussion 09/24/2010   headaches  x 8 months  and  memeory problems for months no current residual from   History of kidney stones    History of migraines 09/24/2010   History of postpartum depression 2002   HSV-2 infection 09/24/2010   HTN (hypertension) 09/24/2010   Hyperlipidemia    Hypertension    Hypertriglyceridemia 08/11/2019   Hypothyroidism    Interstitial cystitis 02/18/2011   Left shoulder pain 05/27/2016   Migraine 09/24/2010   Migraine aura, persistent, with cerebral infarct, status over 72 hours 09/24/2010   Myalgia 06/08/2018   Neck pain, musculoskeletal 12/16/2010   oa  and djd no limited neck motion   OSA (obstructive sleep apnea) 08/28/2020   using auto set 6 to 17   Overweight 05/29/2015   Pharyngitis    sore throat and hurts when yawns since nov t & s surgery is improving   Pneumonia    last time 2012   Poor concentration 12/16/2010   Recurrent UTI    last uti 1st of december   Self-catheterizes  urinary bladder 12/27/2020   prn   Subacute and chronic vaginitis 09/24/2010   TIA (transient ischemic attack) 2015   saw dr berry none since   Tick bite 12/04/2013   rocky mountain spotted fever x 3 months last time 2015   Urinary incontinence    weras pads   Vaginitis and vulvovaginitis 05/21/2014   Wears glasses    or contacts    Past Surgical History:  Procedure Laterality Date   ABDOMINAL SACROCOLPOPEXY N/A 12/31/2020   Procedure: ABDOMINO SACROCOLPOPEXY;  Surgeon: Jaquita Folds, MD;  Location: [facility];  Service: Gynecology;  Laterality: N/A;   ANTERIOR AND POSTERIOR REPAIR  2003   colonscopy  2017   polyps removed due now   CYSTOSCOPY N/A 12/31/2020   Procedure: CYSTOSCOPY;  Surgeon: Jaquita Folds, MD;  Location: [facility];  Service: Gynecology;  Laterality: N/A;   INCONTINENCE SURGERY  01/13/2001   ROBOTIC ASSISTED TOTAL HYSTERECTOMY N/A 12/31/2020   Procedure: XI ROBOTIC ASSISTED TOTAL HYSTERECTOMY with bilateral salpingectomy;  Surgeon: Jaquita Folds, MD;  Location: [facility];  Service: Gynecology;  Laterality: N/A;   TONSILLECTOMY  11/15/2020   and adenoids removed due to constant infections at [facility] [place]    Family History  Problem  Relation Age of Onset   Osteoporosis Mother    Hyperlipidemia Mother    Kidney disease Father    Heart attack Father    Hyperlipidemia Father    Hypertension Father    Alcohol abuse Father    Heart disease Father    Heart attack Brother 61   Hypertension Brother    Heart disease Brother 29       MI   Hyperlipidemia Brother    Heart disease Maternal Grandmother    Heart failure Maternal Grandmother    Valvular heart disease Maternal Grandmother    Dementia Maternal Grandfather 92       early stages   Stroke Maternal Grandfather    Heart failure Maternal Grandfather    Heart attack Paternal Grandfather    Stroke Paternal Grandfather     Heart disease Paternal Grandfather    Alcohol abuse Paternal Grandfather    Hyperlipidemia Paternal Grandfather    Hypertension Paternal Grandfather     Social History   Socioeconomic History   Marital status: Married    Spouse name: Not on file   Number of children: Not on file   Years of education: Not on file   Highest education level: Not on file  Occupational History   Not on file  Tobacco Use   Smoking status: Never   Smokeless tobacco: Never  Vaping Use   Vaping Use: Never used  Substance and Sexual Activity   Alcohol use: No    Alcohol/week: 0.0 standard drinks of alcohol    Comment: 0   Drug use: No   Sexual activity: Yes    Partners: Male    Birth control/protection: Pill  Other Topics Concern   Not on file  Social History Narrative   Lives with husband 2 children in a one story home.     Works as a Geophysicist/field seismologist.     Education: college.   Social Determinants of Health   Financial Resource Strain: Not on file  Food Insecurity: Not on file  Transportation Needs: Not on file  Physical Activity: Not on file  Stress: Not on file  Social Connections: Not on file  Intimate Partner Violence: Not on file    Outpatient Medications Prior to Visit  Medication Sig Dispense Refill   acetaminophen (TYLENOL) 500 MG tablet Take 1 tablet (500 mg total) by mouth every 6 (six) hours as needed (pain). (Patient taking differently: Take 500 mg by mouth every 6 (six) hours as needed (pain). For after sugery) 30 tablet 0   acyclovir (ZOVIRAX) 200 MG capsule TAKE 1 CAPSULE BY MOUTH TWICE A DAY INS LIMIT OF 30 DAYS 60 capsule 11   albuterol (VENTOLIN HFA) 108 (90 Base) MCG/ACT inhaler Inhale 2 puffs into the lungs every 6 (six) hours as needed. 1 each 3   ALPRAZolam (XANAX) 0.5 MG tablet 1 tablet by mouth 30 minutes prior to flight. 4 tablet 0   amphetamine-dextroamphetamine (ADDERALL) 20 MG tablet Take 1 tablet (20 mg total) by mouth 2 (two) times daily. 60 tablet 0    budesonide-formoterol (SYMBICORT) 160-4.5 MCG/ACT inhaler TAKE 2 PUFFS BY MOUTH TWICE A DAY 10.2 each 5   cloNIDine (CATAPRES) 0.1 MG tablet Take 1 tablet (0.1 mg total) by mouth 2 (two) times daily. 80 tablet 2   Cyanocobalamin (VITAMIN B 12 PO) Take by mouth. Vitamin b 12 subligual daily     diazepam (VALIUM) 5 MG tablet Place 1 tablet vaginally nightly as needed for muscle spasm/ pelvic pain. 1  tablet 0   enalapril (VASOTEC) 10 MG tablet Take 10 mg (one tablet) in the morning.Take 5 mg (one-half tablet) in the evening. 120 tablet 3   EPINEPHrine 0.3 mg/0.3 mL IJ SOAJ injection Inject 0.3 mg into the muscle as needed for anaphylaxis. 2 each 2   fluconazole (DIFLUCAN) 150 MG tablet Take 1 tablet (150 mg total) by mouth once a week. Takes prn 2 tablet 2   HYDROcodone-acetaminophen (NORCO/VICODIN) 5-325 MG tablet Take 1 tablet by mouth every 6 (six) hours as needed for moderate pain. 90 tablet 0   hydrOXYzine (ATARAX) 50 MG tablet Take 1 tablet (50 mg total) by mouth every 6 (six) hours as needed for anxiety. 5 tablet 0   ibuprofen (ADVIL) 600 MG tablet Take 1 tablet (600 mg total) by mouth every 6 (six) hours as needed. (Patient taking differently: Take 600 mg by mouth every 6 (six) hours as needed. For after surgery) 30 tablet 0   lidocaine (XYLOCAINE) 5 % ointment Apply 1 application topically daily as needed. 50 g 1   liothyronine (CYTOMEL) 5 MCG tablet TAKE 1 TABLET BY MOUTH EVERY DAY 90 tablet 1   MAGNESIUM PO Take by mouth. Otc daily     meloxicam (MOBIC) 7.5 MG tablet Take 1 tablet (7.5 mg total) by mouth daily. 14 tablet 0   Multiple Vitamins-Minerals (MULTIVITAMIN WITH MINERALS) tablet Take 1 tablet by mouth daily.     nebivolol (BYSTOLIC) 5 MG tablet 1 tablet by mouth once daily as needed for systolic blood pressure >973. 30 tablet 3   neomycin-polymyxin b-dexamethasone (MAXITROL) 3.5-10000-0.1 SUSP Place 1 drop into both eyes in the morning, at noon, in the evening, and at bedtime. 5 mL 1    nitrofurantoin, macrocrystal-monohydrate, (MACROBID) 100 MG capsule TAKE 1 CAPSULE FOR 1 DOSE AS NEEDED FOR PREVENTION OF UTI *AFTER SELF CATH, SWIMMING, HOT TUB, SEX* 30 capsule 8   Norethindrone Acetate-Ethinyl Estrad-FE (BLISOVI 24 FE) 1-20 MG-MCG(24) tablet Take 1 tablet by mouth daily. 28 tablet 11   ondansetron (ZOFRAN) 4 MG tablet Take 1 tablet (4 mg total) by mouth every 8 (eight) hours as needed for nausea or vomiting. (Patient taking differently: Take 4 mg by mouth every 8 (eight) hours as needed for nausea or vomiting. For after surgery) 10 tablet 0   polyethylene glycol powder (GLYCOLAX/MIRALAX) 17 GM/SCOOP powder Take 17 g by mouth daily. Drink 17g (1 scoop) dissolved in water per day. (Patient taking differently: Take 17 g by mouth daily. Drink 17g (1 scoop) dissolved in water per day. For after surgery) 255 g 0   SYNTHROID 137 MCG tablet Take 1 tablet (137 mcg total) by mouth daily. 90 tablet 1   No facility-administered medications prior to visit.    Allergies  Allergen Reactions   Chocolate Anaphylaxis    Trouble breathing facial swelling   Peanut-Containing Drug Products Anaphylaxis    All nuts of every kind   Clindamycin/Lincomycin Nausea And Vomiting    And diarrhea.    Influenza Vaccines Rash    Significant swelling in arm, rash, malaise, myalgias    Bystolic [Nebivolol Hcl]     Fatigue arm heaviness   Doxazosin     Would not take due to side effects listed in patient information   Enalapril     Swelling at higher doses fatigue   Hydralazine     Urinary retention and edema    Hydrochlorothiazide     Bladder issues and kidney stones   Losartan  Did not work   Company secretary [Spironolactone]     Leg cramps   Terazol [Terconazole]     Burning sensation   Latex Rash    ROS     Objective:    Physical Exam  There were no vitals taken for this visit. Wt Readings from Last 3 Encounters:  06/04/21 158 lb (71.7 kg)  01/28/21 162 lb (73.5 kg)  12/31/20 157  lb 6.4 oz (71.4 kg)    Diabetic Foot Exam - Simple   No data filed    Lab Results  Component Value Date   WBC 7.8 06/11/2021   HGB 14.4 06/11/2021   HCT 44.1 06/11/2021   PLT 197.0 06/11/2021   GLUCOSE 94 06/04/2021   CHOL 340 (H) 01/28/2021   TRIG (H) 01/28/2021    666.0 Triglyceride is over 400; calculations on Lipids are invalid.   HDL 37.40 (L) 01/28/2021   LDLDIRECT 170.0 01/28/2021   LDLCALC 136 (H) 11/22/2012   ALT 22 01/28/2021   AST 21 01/28/2021   NA 136 06/04/2021   K 3.9 06/04/2021   CL 100 06/04/2021   CREATININE 0.76 06/04/2021   BUN 14 06/04/2021   CO2 31 06/04/2021   TSH 1.37 01/28/2021   INR 1.09 05/23/2015    Lab Results  Component Value Date   TSH 1.37 01/28/2021   Lab Results  Component Value Date   WBC 7.8 06/11/2021   HGB 14.4 06/11/2021   HCT 44.1 06/11/2021   MCV 92.6 06/11/2021   PLT 197.0 06/11/2021   Lab Results  Component Value Date   NA 136 06/04/2021   K 3.9 06/04/2021   CO2 31 06/04/2021   GLUCOSE 94 06/04/2021   BUN 14 06/04/2021   CREATININE 0.76 06/04/2021   BILITOT 0.6 01/28/2021   ALKPHOS 85 01/28/2021   AST 21 01/28/2021   ALT 22 01/28/2021   PROT 7.2 01/28/2021   ALBUMIN 4.4 01/28/2021   CALCIUM 9.6 06/04/2021   ANIONGAP 8 12/27/2020   EGFR 102 04/09/2020   GFR 94.61 06/04/2021   Lab Results  Component Value Date   CHOL 340 (H) 01/28/2021   Lab Results  Component Value Date   HDL 37.40 (L) 01/28/2021   Lab Results  Component Value Date   LDLCALC 136 (H) 11/22/2012   Lab Results  Component Value Date   TRIG (H) 01/28/2021    666.0 Triglyceride is over 400; calculations on Lipids are invalid.   Lab Results  Component Value Date   CHOLHDL 9 01/28/2021   No results found for: "HGBA1C"     Assessment & Plan:     Problem List Items Addressed This Visit   None   I am having [place] maintain her neomycin-polymyxin b-dexamethasone, enalapril, acetaminophen, ibuprofen, polyethylene glycol  powder, ondansetron, multivitamin with minerals, MAGNESIUM PO, Cyanocobalamin (VITAMIN B 12 PO), fluconazole, Blisovi 24 Fe, budesonide-formoterol, EPINEPHrine, albuterol, HYDROcodone-acetaminophen, lidocaine, diazepam, hydrOXYzine, liothyronine, cloNIDine, meloxicam, ALPRAZolam, nebivolol, amphetamine-dextroamphetamine, acyclovir, nitrofurantoin (macrocrystal-monohydrate), and Synthroid.  No orders of the defined types were placed in this encounter.

## 2021-07-29 ENCOUNTER — Ambulatory Visit (INDEPENDENT_AMBULATORY_CARE_PROVIDER_SITE_OTHER): Payer: BC Managed Care – PPO | Admitting: Family Medicine

## 2021-07-29 ENCOUNTER — Encounter: Payer: Self-pay | Admitting: Family Medicine

## 2021-07-29 VITALS — BP 128/82 | HR 77 | Resp 20 | Ht 62.0 in | Wt 160.0 lb

## 2021-07-29 DIAGNOSIS — E782 Mixed hyperlipidemia: Secondary | ICD-10-CM

## 2021-07-29 DIAGNOSIS — I10 Essential (primary) hypertension: Secondary | ICD-10-CM

## 2021-07-29 DIAGNOSIS — Z79899 Other long term (current) drug therapy: Secondary | ICD-10-CM

## 2021-07-29 DIAGNOSIS — E559 Vitamin D deficiency, unspecified: Secondary | ICD-10-CM

## 2021-07-29 DIAGNOSIS — J4520 Mild intermittent asthma, uncomplicated: Secondary | ICD-10-CM | POA: Diagnosis not present

## 2021-07-29 DIAGNOSIS — D649 Anemia, unspecified: Secondary | ICD-10-CM

## 2021-07-29 DIAGNOSIS — E538 Deficiency of other specified B group vitamins: Secondary | ICD-10-CM

## 2021-07-29 DIAGNOSIS — E039 Hypothyroidism, unspecified: Secondary | ICD-10-CM

## 2021-07-29 DIAGNOSIS — M797 Fibromyalgia: Secondary | ICD-10-CM

## 2021-07-29 DIAGNOSIS — E781 Pure hyperglyceridemia: Secondary | ICD-10-CM

## 2021-07-29 DIAGNOSIS — R41840 Attention and concentration deficit: Secondary | ICD-10-CM

## 2021-07-29 LAB — COMPREHENSIVE METABOLIC PANEL
ALT: 29 U/L (ref 0–35)
AST: 22 U/L (ref 0–37)
Albumin: 4.5 g/dL (ref 3.5–5.2)
Alkaline Phosphatase: 84 U/L (ref 39–117)
BUN: 13 mg/dL (ref 6–23)
CO2: 28 mEq/L (ref 19–32)
Calcium: 9.5 mg/dL (ref 8.4–10.5)
Chloride: 101 mEq/L (ref 96–112)
Creatinine, Ser: 0.67 mg/dL (ref 0.40–1.20)
GFR: 105.42 mL/min (ref >60.00)
Glucose, Bld: 101 mg/dL — ABNORMAL HIGH (ref 70–99)
Potassium: 4.2 mEq/L (ref 3.5–5.1)
Sodium: 137 mEq/L (ref 135–145)
Total Bilirubin: 0.7 mg/dL (ref 0.2–1.2)
Total Protein: 6.8 g/dL (ref 6.0–8.3)

## 2021-07-29 LAB — VITAMIN D 25 HYDROXY (VIT D DEFICIENCY, FRACTURES): VITD: 49.86 ng/mL (ref 30.00–100.00)

## 2021-07-29 LAB — LIPID PANEL
Cholesterol: 261 mg/dL — ABNORMAL HIGH (ref 0–200)
HDL: 41.6 mg/dL (ref >39.00)
NonHDL: 219.39
Total CHOL/HDL Ratio: 6
Triglycerides: 260 mg/dL — ABNORMAL HIGH (ref 0.0–149.0)
VLDL: 52 mg/dL — ABNORMAL HIGH (ref 0.0–40.0)

## 2021-07-29 LAB — LDL CHOLESTEROL, DIRECT: Direct LDL: 177 mg/dL

## 2021-07-29 LAB — TSH: TSH: 0.11 u[IU]/mL — ABNORMAL LOW (ref 0.35–5.50)

## 2021-07-29 LAB — VITAMIN B12: Vitamin B-12: 781 pg/mL (ref 211–911)

## 2021-07-29 LAB — CBC
HCT: 46.9 % — ABNORMAL HIGH (ref 36.0–46.0)
Hemoglobin: 15.4 g/dL — ABNORMAL HIGH (ref 12.0–15.0)
MCHC: 32.9 g/dL (ref 30.0–36.0)
MCV: 91 fl (ref 78.0–100.0)
Platelets: 192 10*3/uL (ref 150.0–400.0)
RBC: 5.15 Mil/uL — ABNORMAL HIGH (ref 3.87–5.11)
RDW: 13.5 % (ref 11.5–15.5)
WBC: 8.4 10*3/uL (ref 4.0–10.5)

## 2021-07-29 MED ORDER — AMPHETAMINE-DEXTROAMPHETAMINE 20 MG PO TABS: 20.0000 mg | ORAL_TABLET | Freq: Two times a day (BID) | ORAL | 0 refills | Status: DC

## 2021-07-29 MED ORDER — HYDROCODONE-ACETAMINOPHEN 5-325 MG PO TABS: 1.0000 | ORAL_TABLET | Freq: Four times a day (QID) | ORAL | 0 refills | Status: DC | PRN

## 2021-07-29 MED ORDER — CLONIDINE 0.1 MG/24HR TD PTWK: 0.1000 mg | MEDICATED_PATCH | TRANSDERMAL | 3 refills | Status: DC

## 2021-07-29 MED ORDER — HYOSCYAMINE SULFATE SL 0.125 MG SL SUBL: 1.0000 | SUBLINGUAL_TABLET | Freq: Four times a day (QID) | SUBLINGUAL | 1 refills | Status: DC | PRN

## 2021-07-29 NOTE — Patient Instructions (Addendum)
[place] tea do not drink  Hypertension, Adult High blood pressure (hypertension) is when the force of blood pumping through the arteries is too strong. The arteries are the blood vessels that carry blood from the heart throughout the body. Hypertension forces the heart to work harder to pump blood and may cause arteries to become narrow or stiff. Untreated or uncontrolled hypertension can lead to a heart attack, heart failure, a stroke, kidney disease, and other problems. A blood pressure reading consists of a higher number over a lower number. Ideally, your blood pressure should be below 120/80. The first ("top") number is called the systolic pressure. It is a measure of the pressure in your arteries as your heart beats. The second ("bottom") number is called the diastolic pressure. It is a measure of the pressure in your arteries as the heart relaxes. What are the causes? The exact cause of this condition is not known. There are some conditions that result in high blood pressure. What increases the risk? Certain factors may make you more likely to develop high blood pressure. Some of these risk factors are under your control, including: Smoking. Not getting enough exercise or physical activity. Being overweight. Having too much fat, sugar, calories, or salt (sodium) in your diet. Drinking too much alcohol. Other risk factors include: Having a personal history of heart disease, diabetes, high cholesterol, or kidney disease. Stress. Having a family history of high blood pressure and high cholesterol. Having obstructive sleep apnea. Age. The risk increases with age. What are the signs or symptoms? High blood pressure may not cause symptoms. Very high blood pressure (hypertensive crisis) may cause: Headache. Fast or irregular heartbeats (palpitations). Shortness of breath. Nosebleed. Nausea and vomiting. Vision changes. Severe chest pain, dizziness, and seizures. How is this  diagnosed? This condition is diagnosed by measuring your blood pressure while you are seated, with your arm resting on a flat surface, your legs uncrossed, and your feet flat on the floor. The cuff of the blood pressure monitor will be placed directly against the skin of your upper arm at the level of your heart. Blood pressure should be measured at least twice using the same arm. Certain conditions can cause a difference in blood pressure between your right and left arms. If you have a high blood pressure reading during one visit or you have normal blood pressure with other risk factors, you may be asked to: Return on a different day to have your blood pressure checked again. Monitor your blood pressure at home for 1 week or longer. If you are diagnosed with hypertension, you may have other blood or imaging tests to help your health care provider understand your overall risk for other conditions. How is this treated? This condition is treated by making healthy lifestyle changes, such as eating healthy foods, exercising more, and reducing your alcohol intake. You may be referred for counseling on a healthy diet and physical activity. Your health care provider may prescribe medicine if lifestyle changes are not enough to get your blood pressure under control and if: Your systolic blood pressure is above 130. Your diastolic blood pressure is above 80. Your personal target blood pressure may vary depending on your medical conditions, your age, and other factors. Follow these instructions at home: Eating and drinking  Eat a diet that is high in fiber and potassium, and low in sodium, added sugar, and fat. An example of this eating plan is called the DASH diet. DASH stands for Dietary Approaches to   Stop Hypertension. To eat this way: Eat plenty of fresh fruits and vegetables. Try to fill one half of your plate at each meal with fruits and vegetables. Eat whole grains, such as whole-wheat pasta, brown  rice, or whole-grain bread. Fill about one fourth of your plate with whole grains. Eat or drink low-fat dairy products, such as skim milk or low-fat yogurt. Avoid fatty cuts of meat, processed or cured meats, and poultry with skin. Fill about one fourth of your plate with lean proteins, such as fish, chicken without skin, beans, eggs, or tofu. Avoid pre-made and processed foods. These tend to be higher in sodium, added sugar, and fat. Reduce your daily sodium intake. Many people with hypertension should eat less than 1,500 mg of sodium a day. Do not drink alcohol if: Your health care provider tells you not to drink. You are pregnant, may be pregnant, or are planning to become pregnant. If you drink alcohol: Limit how much you have to: 0-1 drink a day for women. 0-2 drinks a day for men. Know how much alcohol is in your drink. In the [place] one drink equals one 12 oz bottle of beer (355 mL), one 5 oz glass of wine (148 mL), or one 1 oz glass of hard liquor (44 mL). Lifestyle  Work with your health care provider to maintain a healthy body weight or to lose weight. Ask what an ideal weight is for you. Get at least 30 minutes of exercise that causes your heart to beat faster (aerobic exercise) most days of the week. Activities may include walking, swimming, or biking. Include exercise to strengthen your muscles (resistance exercise), such as Pilates or lifting weights, as part of your weekly exercise routine. Try to do these types of exercises for 30 minutes at least 3 days a week. Do not use any products that contain nicotine or tobacco. These products include cigarettes, chewing tobacco, and vaping devices, such as e-cigarettes. If you need help quitting, ask your health care provider. Monitor your blood pressure at home as told by your health care provider. Keep all follow-up visits. This is important. Medicines Take over-the-counter and prescription medicines only as told by your health care  provider. Follow directions carefully. Blood pressure medicines must be taken as prescribed. Do not skip doses of blood pressure medicine. Doing this puts you at risk for problems and can make the medicine less effective. Ask your health care provider about side effects or reactions to medicines that you should watch for. Contact a health care provider if you: Think you are having a reaction to a medicine you are taking. Have headaches that keep coming back (recurring). Feel dizzy. Have swelling in your ankles. Have trouble with your vision. Get help right away if you: Develop a severe headache or confusion. Have unusual weakness or numbness. Feel faint. Have severe pain in your chest or abdomen. Vomit repeatedly. Have trouble breathing. These symptoms may be an emergency. Get help right away. Call 911. Do not wait to see if the symptoms will go away. Do not drive yourself to the hospital. Summary Hypertension is when the force of blood pumping through your arteries is too strong. If this condition is not controlled, it may put you at risk for serious complications. Your personal target blood pressure may vary depending on your medical conditions, your age, and other factors. For most people, a normal blood pressure is less than 120/80. Hypertension is treated with lifestyle changes, medicines, or a combination of both. Lifestyle   changes include losing weight, eating a healthy, low-sodium diet, exercising more, and limiting alcohol. This information is not intended to replace advice given to you by your health care provider. Make sure you discuss any questions you have with your health care provider. Document Revised: 11/06/2020 Document Reviewed: 11/06/2020 Elsevier Patient Education  2023 [employer] Inc[employer]  

## 2021-07-29 NOTE — Assessment & Plan Note (Signed)
Supplement and monitor 

## 2021-07-29 NOTE — Assessment & Plan Note (Signed)
Clonidine has been helpful to control her blood pressure but she has had increased congestion and fatigue. Will try to cahnge from the tabs to Catapres 0.1 mg patch changed weekly if she can get it covered or finds a reasonable cash pay price.

## 2021-07-29 NOTE — Assessment & Plan Note (Signed)
Refills given on Adderall

## 2021-07-31 ENCOUNTER — Telehealth: Payer: Self-pay

## 2021-07-31 DIAGNOSIS — Z0000 Encounter for general adult medical examination without abnormal findings: Secondary | ICD-10-CM

## 2021-07-31 LAB — DRUG MONITORING PANEL 376104, URINE
Amphetamine: 746 ng/mL — ABNORMAL HIGH (ref <250)
Amphetamines: POSITIVE ng/mL — AB (ref <500)
Barbiturates: NEGATIVE ng/mL (ref <300)
Benzodiazepines: NEGATIVE ng/mL (ref <100)
Cocaine Metabolite: NEGATIVE ng/mL (ref <150)
Desmethyltramadol: NEGATIVE ng/mL (ref <100)
Methamphetamine: NEGATIVE ng/mL (ref <250)
Opiates: NEGATIVE ng/mL (ref <100)
Oxycodone: NEGATIVE ng/mL (ref <100)
Tramadol: NEGATIVE ng/mL (ref <100)

## 2021-07-31 LAB — DM TEMPLATE

## 2021-07-31 NOTE — Telephone Encounter (Signed)
Called patient to determine if she has returned the Vivify cuff or still has access to it and need to return it. Patient did not answer. Left message for patient to return call to provide status update on cuff.    Kristen [name], [facility] Care Guide, Health Coach [address] [place] [address] Telephone: [phone] Email: [email or web address]  

## 2021-08-01 ENCOUNTER — Encounter: Payer: Self-pay | Admitting: Nurse Practitioner

## 2021-08-06 ENCOUNTER — Encounter: Payer: Self-pay | Admitting: Family Medicine

## 2021-08-06 ENCOUNTER — Other Ambulatory Visit: Payer: Self-pay | Admitting: Family Medicine

## 2021-08-06 MED ORDER — DOXYCYCLINE HYCLATE 100 MG PO TABS: 100.0000 mg | ORAL_TABLET | Freq: Two times a day (BID) | ORAL | 0 refills | Status: DC
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
## 2021-08-21 ENCOUNTER — Ambulatory Visit (INDEPENDENT_AMBULATORY_CARE_PROVIDER_SITE_OTHER): Payer: BC Managed Care – PPO | Admitting: Nurse Practitioner

## 2021-08-21 ENCOUNTER — Encounter: Payer: Self-pay | Admitting: Nurse Practitioner

## 2021-08-21 VITALS — BP 124/66 | HR 84 | Ht 62.0 in | Wt 157.0 lb

## 2021-08-21 DIAGNOSIS — R0989 Other specified symptoms and signs involving the circulatory and respiratory systems: Secondary | ICD-10-CM

## 2021-08-21 DIAGNOSIS — Z86010 Personal history of colonic polyps: Secondary | ICD-10-CM | POA: Diagnosis not present

## 2021-08-21 NOTE — Patient Instructions (Signed)
_______________________________________________________  If you are age 46 or older, your body mass index should be between 23-30. Your Body mass index is 28.72 kg/m. If this is out of the aforementioned range listed, please consider follow up with your Primary Care Provider.  If you are age 92 or younger, your body mass index should be between 19-25. Your Body mass index is 28.72 kg/m. If this is out of the aformentioned range listed, please consider follow up with your Primary Care Provider.   ________________________________________________________  The  GI providers would like to encourage you to use [facility] to communicate with providers for non-urgent requests or questions.  Due to long hold times on the telephone, sending your provider a message by [facility] may be a faster and more efficient way to get a response.  Please allow 48 business hours for a response.  Please remember that this is for non-urgent requests.  _______________________________________________________  Kristen [name] will be contacted by [facility] Scheduling in the next 2 days to arrange a Barium Swallow.  The number on your caller ID will be [phone], please answer when they call.  If you have not heard from them in 2 days please call [phone] to schedule.    We will put in a recall for you to schedule colonoscopy in November

## 2021-08-22 ENCOUNTER — Telehealth: Payer: Self-pay

## 2021-08-22 NOTE — Telephone Encounter (Signed)
Called pt  advised of PA been approved

## 2021-08-22 NOTE — Telephone Encounter (Signed)
PA initiated via Covermymeds; KEY: [id number]. Awaiting determination.

## 2021-08-22 NOTE — Telephone Encounter (Signed)
PA approved.  Effective from 08/22/2021 through 09/20/2021

## 2021-08-22 NOTE — Telephone Encounter (Signed)
PA cancelled by plan. No other details given.

## 2021-08-22 NOTE — Telephone Encounter (Signed)
PA initiated via Covermymeds; KEY: [id number]. Awaiting determination.

## 2021-08-29 NOTE — Progress Notes (Signed)
Agree with the assessment and plan as outlined by Colleen Kennedy-Smith, NP.   Ravin Denardo, DO, FACG [facility]   

## 2021-09-17 DIAGNOSIS — G4733 Obstructive sleep apnea (adult) (pediatric): Secondary | ICD-10-CM | POA: Diagnosis not present

## 2021-09-30 ENCOUNTER — Other Ambulatory Visit: Payer: Self-pay | Admitting: Family

## 2021-10-02 DIAGNOSIS — M545 Low back pain, unspecified: Secondary | ICD-10-CM | POA: Insufficient documentation

## 2021-10-30 NOTE — Assessment & Plan Note (Signed)
Encouraged moist heat and gentle stretching as tolerated. May try NSAIDs and prescription meds as directed and report if symptoms worsen or seek immediate care. Uses Hydrocodone sparingly to manage severe pain may continue

## 2021-10-30 NOTE — Assessment & Plan Note (Signed)
Stable on Adderall 20 mg po bid

## 2021-10-30 NOTE — Assessment & Plan Note (Signed)
Supplement and monitor 

## 2021-10-30 NOTE — Assessment & Plan Note (Addendum)
On Levothyroxine, continue to monitor, elevated TPA, check [place] and refer to endocrinology for further consideration

## 2021-10-30 NOTE — Assessment & Plan Note (Signed)
Encourage heart healthy diet such as MIND or DASH diet, increase exercise, avoid trans fats, simple carbohydrates and processed foods, consider a krill or fish or flaxseed oil cap daily.  °

## 2021-10-30 NOTE — Assessment & Plan Note (Signed)
Well controlled, no changes to meds. Encouraged heart healthy diet such as the DASH diet and exercise as tolerated.  °

## 2021-10-31 ENCOUNTER — Ambulatory Visit (INDEPENDENT_AMBULATORY_CARE_PROVIDER_SITE_OTHER): Payer: BC Managed Care – PPO | Admitting: Family Medicine

## 2021-10-31 VITALS — BP 140/82 | HR 70 | Temp 97.0°F | Resp 16 | Ht 62.0 in | Wt 158.0 lb

## 2021-10-31 DIAGNOSIS — E538 Deficiency of other specified B group vitamins: Secondary | ICD-10-CM | POA: Diagnosis not present

## 2021-10-31 DIAGNOSIS — E782 Mixed hyperlipidemia: Secondary | ICD-10-CM

## 2021-10-31 DIAGNOSIS — R0789 Other chest pain: Secondary | ICD-10-CM

## 2021-10-31 DIAGNOSIS — R079 Chest pain, unspecified: Secondary | ICD-10-CM

## 2021-10-31 DIAGNOSIS — E559 Vitamin D deficiency, unspecified: Secondary | ICD-10-CM | POA: Diagnosis not present

## 2021-10-31 DIAGNOSIS — L989 Disorder of the skin and subcutaneous tissue, unspecified: Secondary | ICD-10-CM

## 2021-10-31 DIAGNOSIS — E349 Endocrine disorder, unspecified: Secondary | ICD-10-CM

## 2021-10-31 DIAGNOSIS — R350 Frequency of micturition: Secondary | ICD-10-CM | POA: Diagnosis not present

## 2021-10-31 DIAGNOSIS — I10 Essential (primary) hypertension: Secondary | ICD-10-CM

## 2021-10-31 DIAGNOSIS — R6882 Decreased libido: Secondary | ICD-10-CM

## 2021-10-31 DIAGNOSIS — E039 Hypothyroidism, unspecified: Secondary | ICD-10-CM

## 2021-10-31 DIAGNOSIS — Z01419 Encounter for gynecological examination (general) (routine) without abnormal findings: Secondary | ICD-10-CM

## 2021-10-31 DIAGNOSIS — M5136 Other intervertebral disc degeneration, lumbar region: Secondary | ICD-10-CM

## 2021-10-31 DIAGNOSIS — R41840 Attention and concentration deficit: Secondary | ICD-10-CM

## 2021-10-31 LAB — LIPID PANEL
Cholesterol: 265 mg/dL — ABNORMAL HIGH (ref 0–200)
HDL: 45.2 mg/dL (ref >39.00)
LDL Cholesterol: 180 mg/dL — ABNORMAL HIGH (ref 0–99)
NonHDL: 219.57
Total CHOL/HDL Ratio: 6
Triglycerides: 197 mg/dL — ABNORMAL HIGH (ref 0.0–149.0)
VLDL: 39.4 mg/dL (ref 0.0–40.0)

## 2021-10-31 LAB — VITAMIN D 25 HYDROXY (VIT D DEFICIENCY, FRACTURES): VITD: 70.86 ng/mL (ref 30.00–100.00)

## 2021-10-31 LAB — COMPREHENSIVE METABOLIC PANEL
ALT: 27 U/L (ref 0–35)
AST: 23 U/L (ref 0–37)
Albumin: 4.5 g/dL (ref 3.5–5.2)
Alkaline Phosphatase: 86 U/L (ref 39–117)
BUN: 14 mg/dL (ref 6–23)
CO2: 30 mEq/L (ref 19–32)
Calcium: 9.3 mg/dL (ref 8.4–10.5)
Chloride: 101 mEq/L (ref 96–112)
Creatinine, Ser: 0.76 mg/dL (ref 0.40–1.20)
GFR: 94.34 mL/min (ref >60.00)
Glucose, Bld: 99 mg/dL (ref 70–99)
Potassium: 3.8 mEq/L (ref 3.5–5.1)
Sodium: 137 mEq/L (ref 135–145)
Total Bilirubin: 0.7 mg/dL (ref 0.2–1.2)
Total Protein: 6.9 g/dL (ref 6.0–8.3)

## 2021-10-31 LAB — URINALYSIS
Bilirubin Urine: NEGATIVE
Hgb urine dipstick: NEGATIVE
Ketones, ur: NEGATIVE
Leukocytes,Ua: NEGATIVE
Nitrite: NEGATIVE
Specific Gravity, Urine: 1.01 (ref 1.000–1.030)
Total Protein, Urine: NEGATIVE
Urine Glucose: NEGATIVE
Urobilinogen, UA: 0.2 (ref 0.0–1.0)
pH: 7 (ref 5.0–8.0)

## 2021-10-31 LAB — CBC
HCT: 45.7 % (ref 36.0–46.0)
Hemoglobin: 15.2 g/dL — ABNORMAL HIGH (ref 12.0–15.0)
MCHC: 33.2 g/dL (ref 30.0–36.0)
MCV: 93.3 fl (ref 78.0–100.0)
Platelets: 181 10*3/uL (ref 150.0–400.0)
RBC: 4.89 Mil/uL (ref 3.87–5.11)
RDW: 12.9 % (ref 11.5–15.5)
WBC: 7.8 10*3/uL (ref 4.0–10.5)

## 2021-10-31 LAB — T3, FREE: T3, Free: 3.3 pg/mL (ref 2.3–4.2)

## 2021-10-31 LAB — VITAMIN B12: Vitamin B-12: 1366 pg/mL — ABNORMAL HIGH (ref 211–911)

## 2021-10-31 LAB — TSH: TSH: 1.03 u[IU]/mL (ref 0.35–5.50)

## 2021-10-31 LAB — T4, FREE: Free T4: 0.72 ng/dL (ref 0.60–1.60)

## 2021-10-31 MED ORDER — CLONIDINE HCL 0.1 MG PO TABS: 0.1000 mg | ORAL_TABLET | Freq: Three times a day (TID) | ORAL | 1 refills | Status: DC

## 2021-10-31 MED ORDER — DOXYCYCLINE HYCLATE 100 MG PO TABS: 100.0000 mg | ORAL_TABLET | Freq: Two times a day (BID) | ORAL | 0 refills | Status: DC

## 2021-10-31 MED ORDER — NALTREXONE HCL (PAIN) 1.5 MG PO CAPS: 1.5000 | ORAL_CAPSULE | Freq: Every day | ORAL | 1 refills | Status: DC

## 2021-10-31 MED ORDER — NALTREXONE HCL (PAIN) 1.5 MG PO CAPS: 1.0000 | ORAL_CAPSULE | Freq: Every day | ORAL | 1 refills | Status: DC

## 2021-10-31 NOTE — Progress Notes (Signed)
Subjective:   By signing my name below, I, Kristen [name], attest that this documentation has been prepared under the direction and in the presence of Kristen [name] A, MD.,10/31/2021.     Patient ID: Kristen [name], female    DOB: January 09, 1976, 46 y.o.   MRN: [id number]  Chief Complaint  Patient presents with   Follow-up    Here for follow up   HPI Patient is in today for an office visit.  Anxiety/Stress: She states that she is under immense stress due to her business partner betraying her and this is affecting her sleep.  Blood Pressure: She is currently taking 3 tablets of Clonidine 0.1 mg daily to manage her blood pressure and says that this has been effective. Her blood pressure is elevated today. BP Readings from Last 3 Encounters:  10/31/21 (!) 140/82  08/21/21 124/66  07/29/21 128/82   Chest Pain: She reports that she experienced two episodes of mild chest pain two weeks ago that occurred intermittently for one week each. She states that the pain caused her to awaken at night and describes the pain as a sharp pain and pressure. She denies congestion or coughing. She is interested in receiving a cardiology referral.  Dermatology: She is requesting a dermatology referral to manage loose skin under both eyes.  Left Ear Pain: She complains of left ear pain that started several days ago and says that she recently recovered from a sinus infection.  OBGYN: She is requesting a referral for a new OBGYN. She states that her emotions have elevated due to her hormones.  Refills: She is requesting a refill on Doxycycline and a prescription for Naltrexone.  Past Medical History:  Diagnosis Date   Abnormal cervical cytology 09/26/2011   LGSIL in 2011, patient reports repeat pap was normal, has had intermittent abnormals with bx in past but always normalized, has had cryotherapy with good results Menarche at 72, irregular without birth control at times, very heavy LMP 08/26/2011 No gyn concerns,  MGM last year normal    ADD (attention deficit disorder)    Allergy    seasonal   Anemia    hx of none recent per pt on 12-27-2020   Asthma    mild, intermittent   Chicken pox as a child   Claustrophobia 12/27/2020   Concussion 09/24/2010   DJD (degenerative joint disease)    lower back   Family history of heart disease    Female bladder prolapse, acquired 09/24/2010   Fibromyalgia    Genital herpes 12/27/2020   Headache 09/26/2013   History of concussion 09/24/2010   headaches  x 8 months  and  memeory problems for months no current residual from   History of kidney stones    History of migraines 09/24/2010   History of postpartum depression 2002   HSV-2 infection 09/24/2010   HTN (hypertension) 09/24/2010   Hyperlipidemia    Hypertension    Hypertriglyceridemia 08/11/2019   Hypothyroidism    Interstitial cystitis 02/18/2011   Left shoulder pain 05/27/2016   Migraine 09/24/2010   Migraine aura, persistent, with cerebral infarct, status over 72 hours 09/24/2010   Myalgia 06/08/2018   Neck pain, musculoskeletal 12/16/2010   oa  and djd no limited neck motion   OSA (obstructive sleep apnea) 08/28/2020   using auto set 6 to 17   Overweight 05/29/2015   Pharyngitis    sore throat and hurts when yawns since nov t & s surgery is improving   Pneumonia  last time 2012   Poor concentration 12/16/2010   Recurrent UTI    last uti 1st of december   Self-catheterizes urinary bladder 12/27/2020   prn   Subacute and chronic vaginitis 09/24/2010   TIA (transient ischemic attack) 2015   saw dr berry none since   Tick bite 12/04/2013   rocky mountain spotted fever x 3 months last time 2015   Urinary incontinence    weras pads   Vaginitis and vulvovaginitis 05/21/2014   Wears glasses    or contacts   Past Surgical History:  Procedure Laterality Date   ABDOMINAL SACROCOLPOPEXY N/A 12/31/2020   Procedure: ABDOMINO SACROCOLPOPEXY;  Surgeon: Jaquita Folds, MD;   Location: [facility];  Service: Gynecology;  Laterality: N/A;   ANTERIOR AND POSTERIOR REPAIR  2003   colonscopy  2017   polyps removed due now   CYSTOSCOPY N/A 12/31/2020   Procedure: CYSTOSCOPY;  Surgeon: Jaquita Folds, MD;  Location: [facility];  Service: Gynecology;  Laterality: N/A;   INCONTINENCE SURGERY  01/13/2001   ROBOTIC ASSISTED TOTAL HYSTERECTOMY N/A 12/31/2020   Procedure: XI ROBOTIC ASSISTED TOTAL HYSTERECTOMY with bilateral salpingectomy;  Surgeon: Jaquita Folds, MD;  Location: [facility];  Service: Gynecology;  Laterality: N/A;   TONSILLECTOMY  11/15/2020   and adenoids removed due to constant infections at [facility] of    Family History  Problem Relation Age of Onset   Osteoporosis Mother    Hyperlipidemia Mother    Kidney disease Father    Heart attack Father    Hyperlipidemia Father    Hypertension Father    Alcohol abuse Father    Heart disease Father    Heart attack Brother 17   Hypertension Brother    Heart disease Brother 27       MI   Hyperlipidemia Brother    Heart disease Maternal Grandmother    Heart failure Maternal Grandmother    Valvular heart disease Maternal Grandmother    Dementia Maternal Grandfather 92       early stages   Stroke Maternal Grandfather    Heart failure Maternal Grandfather    Heart attack Paternal Grandfather    Stroke Paternal Grandfather    Heart disease Paternal Grandfather    Alcohol abuse Paternal Grandfather    Hyperlipidemia Paternal Grandfather    Hypertension Paternal Grandfather    Crohn's disease Maternal Aunt    Colon cancer Neg Hx    Stomach cancer Neg Hx    Esophageal cancer Neg Hx    Pancreatic cancer Neg Hx    Social History   Socioeconomic History   Marital status: Married    Spouse name: Not on file   Number of children: Not on file   Years of education: Not on file   Highest education level: Not on file  Occupational  History   Not on file  Tobacco Use   Smoking status: Never   Smokeless tobacco: Never  Vaping Use   Vaping Use: Never used  Substance and Sexual Activity   Alcohol use: No    Alcohol/week: 0.0 standard drinks of alcohol    Comment: 0   Drug use: No   Sexual activity: Yes    Partners: Male    Birth control/protection: Pill  Other Topics Concern   Not on file  Social History Narrative   Lives with husband 2 children in a one story home.     Works as a Geophysicist/field seismologist.  Education: college.   Social Determinants of Health   Financial Resource Strain: Not on file  Food Insecurity: Not on file  Transportation Needs: Not on file  Physical Activity: Not on file  Stress: Not on file  Social Connections: Not on file  Intimate Partner Violence: Not on file   Outpatient Medications Prior to Visit  Medication Sig Dispense Refill   acetaminophen (TYLENOL) 500 MG tablet Take 1 tablet (500 mg total) by mouth every 6 (six) hours as needed (pain). (Patient taking differently: Take 500 mg by mouth every 6 (six) hours as needed (pain). For after sugery) 30 tablet 0   acyclovir (ZOVIRAX) 200 MG capsule TAKE 1 CAPSULE BY MOUTH TWICE A DAY INS LIMIT OF 30 DAYS 60 capsule 11   albuterol (VENTOLIN HFA) 108 (90 Base) MCG/ACT inhaler Inhale 2 puffs into the lungs every 6 (six) hours as needed. 1 each 3   ALPRAZolam (XANAX) 0.5 MG tablet 1 tablet by mouth 30 minutes prior to flight. 4 tablet 0   amphetamine-dextroamphetamine (ADDERALL) 20 MG tablet Take 1 tablet (20 mg total) by mouth 2 (two) times daily. September 2023 60 tablet 0   amphetamine-dextroamphetamine (ADDERALL) 20 MG tablet Take 1 tablet (20 mg total) by mouth 2 (two) times daily. August 2023 60 tablet 0   amphetamine-dextroamphetamine (ADDERALL) 20 MG tablet Take 1 tablet (20 mg total) by mouth 2 (two) times daily. July 2023 60 tablet 0   budesonide-formoterol (SYMBICORT) 160-4.5 MCG/ACT inhaler TAKE 2 PUFFS BY MOUTH TWICE A DAY 10.2  each 5   cloNIDine (CATAPRES-TTS-1) 0.1 mg/24hr patch Place 1 patch (0.1 mg total) onto the skin once a week. 4 patch 3   Cyanocobalamin (VITAMIN B 12 PO) Take by mouth. Vitamin b 12 subligual daily     enalapril (VASOTEC) 10 MG tablet Take 10 mg (one tablet) in the morning.Take 5 mg (one-half tablet) in the evening. 120 tablet 3   EPINEPHrine 0.3 mg/0.3 mL IJ SOAJ injection Inject 0.3 mg into the muscle as needed for anaphylaxis. 2 each 2   fluconazole (DIFLUCAN) 150 MG tablet Take 1 tablet (150 mg total) by mouth once a week. Takes prn 2 tablet 2   HYDROcodone-acetaminophen (NORCO/VICODIN) 5-325 MG tablet Take 1 tablet by mouth every 6 (six) hours as needed for moderate pain. 90 tablet 0   hydrOXYzine (ATARAX) 50 MG tablet Take 1 tablet (50 mg total) by mouth every 6 (six) hours as needed for anxiety. 5 tablet 0   Hyoscyamine Sulfate SL 0.125 MG SUBL Place 1 tablet under the tongue every 6 (six) hours as needed. 60 tablet 1   ibuprofen (ADVIL) 600 MG tablet Take 1 tablet (600 mg total) by mouth every 6 (six) hours as needed. (Patient taking differently: Take 600 mg by mouth every 6 (six) hours as needed. For after surgery) 30 tablet 0   lidocaine (XYLOCAINE) 5 % ointment Apply 1 application topically daily as needed. 50 g 1   liothyronine (CYTOMEL) 5 MCG tablet TAKE 1 TABLET BY MOUTH EVERY DAY 90 tablet 1   MAGNESIUM PO Take by mouth. Otc daily     meloxicam (MOBIC) 7.5 MG tablet Take 1 tablet (7.5 mg total) by mouth daily. 14 tablet 0   Multiple Vitamins-Minerals (MULTIVITAMIN WITH MINERALS) tablet Take 1 tablet by mouth daily.     nebivolol (BYSTOLIC) 5 MG tablet 1 tablet by mouth once daily as needed for systolic blood pressure >998. 30 tablet 3   neomycin-polymyxin b-dexamethasone (MAXITROL) 3.5-10000-0.1 SUSP Place 1  drop into both eyes in the morning, at noon, in the evening, and at bedtime. 5 mL 1   nitrofurantoin, macrocrystal-monohydrate, (MACROBID) 100 MG capsule TAKE 1 CAPSULE FOR 1  DOSE AS NEEDED FOR PREVENTION OF UTI *AFTER SELF CATH, SWIMMING, HOT TUB, SEX* 30 capsule 8   Norethindrone Acetate-Ethinyl Estrad-FE (BLISOVI 24 FE) 1-20 MG-MCG(24) tablet Take 1 tablet by mouth daily. 28 tablet 11   ondansetron (ZOFRAN) 4 MG tablet Take 1 tablet (4 mg total) by mouth every 8 (eight) hours as needed for nausea or vomiting. (Patient taking differently: Take 4 mg by mouth every 8 (eight) hours as needed for nausea or vomiting. For after surgery) 10 tablet 0   SYNTHROID 137 MCG tablet Take 1 tablet (137 mcg total) by mouth daily. 90 tablet 1   cloNIDine (CATAPRES) 0.1 MG tablet TAKE 1 TABLET(0.1 MG) BY MOUTH TWICE DAILY 80 tablet 2   diazepam (VALIUM) 5 MG tablet Place 1 tablet vaginally nightly as needed for muscle spasm/ pelvic pain. 1 tablet 0   doxycycline (VIBRA-TABS) 100 MG tablet Take 1 tablet (100 mg total) by mouth 2 (two) times daily. 20 tablet 0   No facility-administered medications prior to visit.   Allergies  Allergen Reactions   Chocolate Anaphylaxis    Trouble breathing facial swelling   Peanut-Containing Drug Products Anaphylaxis    All nuts of every kind   Clindamycin/Lincomycin Nausea And Vomiting    And diarrhea.    Influenza Vaccines Rash    Significant swelling in arm, rash, malaise, myalgias    Bystolic [Nebivolol Hcl]     Fatigue arm heaviness   Doxazosin     Would not take due to side effects listed in patient information   Enalapril     Swelling at higher doses fatigue   Hydralazine     Urinary retention and edema    Hydrochlorothiazide     Bladder issues and kidney stones   Losartan     Did not work   Company secretary [Spironolactone]     Leg cramps   Terazol [Terconazole]     Burning sensation   Latex Rash   Review of Systems  HENT:  Positive for ear pain (left ear). Negative for congestion.   Respiratory:  Negative for cough.       Objective:    Physical Exam Constitutional:      General: She is not in acute distress.     Appearance: Normal appearance. She is not ill-appearing.  HENT:     Head: Normocephalic and atraumatic.     Right Ear: Tympanic membrane, ear canal and external ear normal.     Left Ear: Ear canal and external ear normal. Tenderness present. Tympanic membrane is erythematous.     Mouth/Throat:     Mouth: Mucous membranes are moist.     Pharynx: Oropharynx is clear.  Eyes:     Extraocular Movements: Extraocular movements intact.     Pupils: Pupils are equal, round, and reactive to light.  Cardiovascular:     Rate and Rhythm: Normal rate and regular rhythm.     Pulses: Normal pulses.     Heart sounds: Normal heart sounds. No murmur heard.    No gallop.  Pulmonary:     Effort: Pulmonary effort is normal. No respiratory distress.     Breath sounds: Normal breath sounds. No wheezing or rales.  Abdominal:     General: Bowel sounds are normal.  Skin:    General: Skin is warm and dry.  Comments: (+)  Neurological:     Mental Status: She is alert and oriented to person, place, and time.  Psychiatric:        Mood and Affect: Mood normal.        Behavior: Behavior normal.        Judgment: Judgment normal.    BP (!) 140/82 (BP Location: Right Arm, Patient Position: Sitting, Cuff Size: Normal)   Pulse 70   Temp (!) 97 F (36.1 C) (Oral)   Resp 16   Ht _0  (1.575 m)   Wt 158 lb (71.7 kg)   SpO2 96%   BMI 28.90 kg/m  Wt Readings from Last 3 Encounters:  10/31/21 158 lb (71.7 kg)  08/21/21 157 lb (71.2 kg)  07/29/21 160 lb (72.6 kg)   Diabetic Foot Exam - Simple   No data filed    Lab Results  Component Value Date   WBC 7.8 10/31/2021   HGB 15.2 (H) 10/31/2021   HCT 45.7 10/31/2021   PLT 181.0 10/31/2021   GLUCOSE 99 10/31/2021   CHOL 265 (H) 10/31/2021   TRIG 197.0 (H) 10/31/2021   HDL 45.20 10/31/2021   LDLDIRECT 177.0 07/29/2021   LDLCALC 180 (H) 10/31/2021   ALT 27 10/31/2021   AST 23 10/31/2021   NA 137 10/31/2021   K 3.8 10/31/2021   CL 101 10/31/2021    CREATININE 0.76 10/31/2021   BUN 14 10/31/2021   CO2 30 10/31/2021   TSH 1.03 10/31/2021   INR 1.09 05/23/2015   Lab Results  Component Value Date   TSH 1.03 10/31/2021   Lab Results  Component Value Date   WBC 7.8 10/31/2021   HGB 15.2 (H) 10/31/2021   HCT 45.7 10/31/2021   MCV 93.3 10/31/2021   PLT 181.0 10/31/2021   Lab Results  Component Value Date   NA 137 10/31/2021   K 3.8 10/31/2021   CO2 30 10/31/2021   GLUCOSE 99 10/31/2021   BUN 14 10/31/2021   CREATININE 0.76 10/31/2021   BILITOT 0.7 10/31/2021   ALKPHOS 86 10/31/2021   AST 23 10/31/2021   ALT 27 10/31/2021   PROT 6.9 10/31/2021   ALBUMIN 4.5 10/31/2021   CALCIUM 9.3 10/31/2021   ANIONGAP 8 12/27/2020   EGFR 102 04/09/2020   GFR 94.34 10/31/2021   Lab Results  Component Value Date   CHOL 265 (H) 10/31/2021   Lab Results  Component Value Date   HDL 45.20 10/31/2021   Lab Results  Component Value Date   LDLCALC 180 (H) 10/31/2021   Lab Results  Component Value Date   TRIG 197.0 (H) 10/31/2021   Lab Results  Component Value Date   CHOLHDL 6 10/31/2021   No results found for: "HGBA1C"     Assessment & Plan:   Problem List Items Addressed This Visit     Hyperlipidemia    Encourage heart healthy diet such as MIND or DASH diet, increase exercise, avoid trans fats, simple carbohydrates and processed foods, consider a krill or fish or flaxseed oil cap daily.       Relevant Medications   cloNIDine (CATAPRES) 0.1 MG tablet   Other Relevant Orders   Lipid panel (Completed)   HTN (hypertension)    Well controlled, no changes to meds. Encouraged heart healthy diet such as the DASH diet and exercise as tolerated.       Relevant Medications   cloNIDine (CATAPRES) 0.1 MG tablet   Naltrexone HCl, Pain, 1.5 MG CAPS  Other Relevant Orders   CBC (Completed)   Comprehensive metabolic panel (Completed)   Poor concentration    Stable on Adderall 20 mg po bid      Hypothyroid    On  Levothyroxine, continue to monitor, elevated TPA, check [place] and refer to endocrinology for further consideration      Relevant Orders   TSH (Completed)   T4, free (Completed)   T3, free (Completed)   Thyroid peroxidase antibody (Completed)   Decreased libido   Relevant Orders   Ambulatory referral to Obstetrics / Gynecology   Vitamin B12 deficiency    Supplement and monitor      Relevant Orders   Vitamin B12 (Completed)   Vitamin D deficiency    Supplement and monitor      Relevant Orders   VITAMIN D 25 Hydroxy (Vit-D Deficiency, Fractures) (Completed)   DDD (degenerative disc disease), lumbar    Encouraged moist heat and gentle stretching as tolerated. May try NSAIDs and prescription meds as directed and report if symptoms worsen or seek immediate care. Uses Hydrocodone sparingly to manage severe pain may continue      Relevant Medications   Naltrexone HCl, Pain, 1.5 MG CAPS   Urine frequency   Relevant Orders   Urinalysis (Completed)   Urine Culture (Completed)   Skin lesion of face    Has lesions under both eyes. They are in her line of sight and irritated at time. Refer to dermatology for evaluation      Relevant Orders   Ambulatory referral to Dermatology   Chest pain at rest - Primary    She has had costochondritis in past but she reports this feels different. No associated symptoms and she has been cleaning up and renovating her office herself. EKG is normal but she is referred back to cardiology for further consideration      Relevant Orders   EKG 12-Lead   Hormone deficiency    Is requesting referral to new OB/GYN for WWE and consideration of hormonal replacement after hysterectomy      Relevant Orders   Ambulatory referral to Obstetrics / Gynecology   Other Visit Diagnoses     Atypical chest pain       Relevant Orders   Ambulatory referral to Cardiology   Well woman exam       Relevant Orders   Ambulatory referral to Obstetrics / Gynecology       Meds ordered this encounter  Medications   cloNIDine (CATAPRES) 0.1 MG tablet    Sig: Take 1 tablet (0.1 mg total) by mouth 3 (three) times daily.    Dispense:  180 tablet    Refill:  1   DISCONTD: Naltrexone HCl, Pain, 1.5 MG CAPS    Sig: Take 1.5-3 tablets by mouth daily.    Dispense:  60 capsule    Refill:  1   doxycycline (VIBRA-TABS) 100 MG tablet    Sig: Take 1 tablet (100 mg total) by mouth 2 (two) times daily.    Dispense:  28 tablet    Refill:  0   Naltrexone HCl, Pain, 1.5 MG CAPS    Sig: Take 1-2 tablets by mouth daily.    Dispense:  60 capsule    Refill:  1   I, Kristen Homans, MD, personally preformed the services described in this documentation.  All medical record entries made by the scribe were at my direction and in my presence.  I have reviewed the chart and discharge instructions (if applicable)  and agree that the record reflects my personal performance and is accurate and complete. 10/31/2021  I,Mohammed Iqbal,acting as a scribe for Kristen Homans, MD.,have documented all relevant documentation on the behalf of Kristen Homans, MD,as directed by  Kristen Homans, MD while in the presence of Kristen Homans, MD.  Kristen Homans, MD

## 2021-10-31 NOTE — Patient Instructions (Addendum)

## 2021-11-01 ENCOUNTER — Other Ambulatory Visit: Payer: Self-pay | Admitting: Family Medicine

## 2021-11-01 DIAGNOSIS — E039 Hypothyroidism, unspecified: Secondary | ICD-10-CM

## 2021-11-01 DIAGNOSIS — L989 Disorder of the skin and subcutaneous tissue, unspecified: Secondary | ICD-10-CM | POA: Insufficient documentation

## 2021-11-01 DIAGNOSIS — R079 Chest pain, unspecified: Secondary | ICD-10-CM | POA: Insufficient documentation

## 2021-11-01 DIAGNOSIS — E349 Endocrine disorder, unspecified: Secondary | ICD-10-CM | POA: Insufficient documentation

## 2021-11-01 DIAGNOSIS — R350 Frequency of micturition: Secondary | ICD-10-CM | POA: Insufficient documentation

## 2021-11-01 LAB — THYROID PEROXIDASE ANTIBODY: Thyroperoxidase Ab SerPl-aCnc: 127 IU/mL — ABNORMAL HIGH (ref <9)

## 2021-11-01 NOTE — Assessment & Plan Note (Signed)
Is requesting referral to new OB/GYN for WWE and consideration of hormonal replacement after hysterectomy

## 2021-11-01 NOTE — Assessment & Plan Note (Signed)
Has lesions under both eyes. They are in her line of sight and irritated at time. Refer to dermatology for evaluation

## 2021-11-01 NOTE — Assessment & Plan Note (Signed)
She has had costochondritis in past but she reports this feels different. No associated symptoms and she has been cleaning up and renovating her office herself. EKG is normal but she is referred back to cardiology for further consideration

## 2021-11-02 LAB — URINE CULTURE
MICRO NUMBER:: 14073691
SPECIMEN QUALITY:: ADEQUATE

## 2021-11-04 ENCOUNTER — Encounter: Payer: Self-pay | Admitting: Family Medicine

## 2021-11-04 ENCOUNTER — Other Ambulatory Visit: Payer: Self-pay | Admitting: Family Medicine

## 2021-11-04 MED ORDER — AMOXICILLIN-POT CLAVULANATE 875-125 MG PO TABS: 1.0000 | ORAL_TABLET | Freq: Two times a day (BID) | ORAL | 0 refills | Status: DC

## 2021-11-06 ENCOUNTER — Telehealth (HOSPITAL_BASED_OUTPATIENT_CLINIC_OR_DEPARTMENT_OTHER): Payer: Self-pay

## 2021-11-12 ENCOUNTER — Ambulatory Visit (HOSPITAL_BASED_OUTPATIENT_CLINIC_OR_DEPARTMENT_OTHER)
Admission: RE | Admit: 2021-11-12 | Discharge: 2021-11-12 | Disposition: A | Discharge status: Home / Self Care | Payer: BC Managed Care – PPO | Source: Ambulatory Visit | Attending: Family Medicine | Admitting: Family Medicine

## 2021-11-12 ENCOUNTER — Encounter: Payer: Self-pay | Admitting: Gastroenterology

## 2021-11-12 DIAGNOSIS — E039 Hypothyroidism, unspecified: Secondary | ICD-10-CM | POA: Insufficient documentation

## 2021-11-15 DIAGNOSIS — H0265 Xanthelasma of left lower eyelid: Secondary | ICD-10-CM | POA: Diagnosis not present

## 2021-11-15 DIAGNOSIS — L298 Other pruritus: Secondary | ICD-10-CM | POA: Diagnosis not present

## 2021-11-15 DIAGNOSIS — D1801 Hemangioma of skin and subcutaneous tissue: Secondary | ICD-10-CM | POA: Diagnosis not present

## 2021-11-15 DIAGNOSIS — R208 Other disturbances of skin sensation: Secondary | ICD-10-CM | POA: Diagnosis not present

## 2021-11-15 DIAGNOSIS — L821 Other seborrheic keratosis: Secondary | ICD-10-CM | POA: Diagnosis not present

## 2021-11-15 DIAGNOSIS — L538 Other specified erythematous conditions: Secondary | ICD-10-CM | POA: Diagnosis not present

## 2021-11-15 DIAGNOSIS — H0262 Xanthelasma of right lower eyelid: Secondary | ICD-10-CM | POA: Diagnosis not present

## 2021-11-15 DIAGNOSIS — L918 Other hypertrophic disorders of the skin: Secondary | ICD-10-CM | POA: Diagnosis not present

## 2021-11-19 ENCOUNTER — Other Ambulatory Visit: Payer: Self-pay | Admitting: Family

## 2021-11-19 DIAGNOSIS — M5412 Radiculopathy, cervical region: Secondary | ICD-10-CM

## 2021-11-20 ENCOUNTER — Ambulatory Visit (INDEPENDENT_AMBULATORY_CARE_PROVIDER_SITE_OTHER): Payer: BC Managed Care – PPO | Admitting: Cardiovascular Disease

## 2021-11-20 ENCOUNTER — Encounter (HOSPITAL_BASED_OUTPATIENT_CLINIC_OR_DEPARTMENT_OTHER): Payer: Self-pay | Admitting: Cardiovascular Disease

## 2021-11-20 VITALS — BP 186/142 | HR 100 | Ht 62.0 in | Wt 152.0 lb

## 2021-11-20 DIAGNOSIS — R002 Palpitations: Secondary | ICD-10-CM

## 2021-11-20 DIAGNOSIS — I160 Hypertensive urgency: Secondary | ICD-10-CM

## 2021-11-20 HISTORY — DX: Palpitations: R00.2

## 2021-11-20 MED ORDER — CLONIDINE HCL 0.1 MG PO TABS: 0.1000 mg | ORAL_TABLET | Freq: Three times a day (TID) | ORAL | 2 refills | Status: DC

## 2021-11-20 NOTE — Progress Notes (Signed)
Advanced Hypertension Clinic Follow up Assessment:    Date:  11/20/2021   ID:  [name], DOB 08-Mar-1975, MRN [id number]  PCP:  Mosie Lukes, MD  Cardiologist:  None  Nephrologist:  Referring MD: Mosie Lukes, MD   CC: Hypertension  History of Present Illness:    Kristen [name] is a 46 y.o. female with a hx of hypertension and hyperlipidemia here for follow-up. She initially established care in the hypertension clinic 08/11/2019. She struggled with low BP for years.  Then her BP spiked 09/2013.  She had a TIA and saw Dr. Gwenlyn Found.  She continued to have intermittent spiking of her BP.  She struggled with several medications and finally went on Bystolic.  Her BP was well-controlled but she gained weight and had heaviness in her L arm.  She stopped it and felt much better. Her BP started to be higher after a stomach bug and tick bite.  She was treated with enalapril and the dose was increased.  Initially it worked well but stopped being as effective.  She had to start back on Bystolic but is gaining weight, fatigued, and has heaviness in her chest.  Her blood pressure is controlled but the symptoms are manageable.  She has not had any orthopnea or PND but does sometimes have edema that she attributes to enalapril.  Overall her diet has been good. Her mom had heart disease at a young age so she is used to eating a healthy diet.  Lean meat and limited salt intake.  She cooks at home.  She doesn't drink caffeine or EtOH.  She takes magnesium, B12 injections, vitamin ADEK.  She has been taking Adderall since 8th grade.  She stopped taking it for a few days and her BP was still high.  She has a history of hypertriglyceridemia since she was in middle school.  She has taken fish oil without much change in her numbers.  She is also taking Krill oil.  What helped most was drinking warm water with lemon.  She is averse to trying statins because many family members have had muscle aches.  Of note, her father had a  heart attack in his 44s.  Her brother had 2 heart attacks in his 35s.  Her mother has heart rhythm disorders.  At her initial appointment she was started on amlodipine. She stopped enalapril and Bystolic. Since then she saw our pharmacist and increased spironolactone 12/2019.  She was referred to the prep program through the [facility]. Her blood pressure was still elevated so she was started on hydralazine. She took one dose and it caused bladder issues, so she was switched to doxasozin. She also stopped spironolactone due to leg cramps. She saw our pharmacist on 02/2020 and switched from enalapril to olmesartan. She would not start carvedilol because it caused fatigue for her mother. She stopped olmesartan before follow-up with our pharmacist.  She was started on a low dose of carvedilol. She was also encouraged to follow-up with the sleep team and start a CPA and saw improvement in her BP.  At the last visit her blood pressure remained uncontrolled.  We discussed trying clonidine but she was hesitant.  She started clonidine 07/2021 with her PCP.  At her follow-up visit 10/2021 she reported some intermittent chest pain at night that was sharp.     [name] reports that her blood pressure has been well-controlled with clonidine since July, taking a full tablet in the morning, half a  tablet around three, and a full tablet in the evening. She mentions occasional spikes in blood pressure but overall feels that this is the most controlled it has been for an extended period.  She experienced palpitations approximately a month and a half ago, initially suspecting anxiety attacks. She started using a natural cream for estrogen, which has stopped the palpitations completely. She has an upcoming appointment for hormone replacement therapy. The patient denies experiencing palpitations during her EKG.  The patient has a history of Hashimoto's thyroiditis, with a recent thyroid panel showing a normal TSH but an abnormal T4.  She has had an ultrasound for her thyroid and has a renal ultrasound scheduled due to a long-standing cyst on her kidney. She has previously been tested for pheochromocytoma with catecholamines and metanephrines lab tests, which were negative.  Ms. Kaufhold reports that her hemoglobin and hematocrit levels have been slightly elevated, but she has been diligent with her water intake, ruling out dehydration as a cause. She has noticed that her blood pressure feels better when taking aspirin and wonders if there is too much thickening happening in her blood.   Previous antihypertensives: HCTZ - AKI, bladder issues, and kidney stones Losartan-didn't work Bystolic- fatigue, arm heaviness Enalapril- swelling at higher dose, fatigue. Amlodipine - urine retention, edema, leg tighten, elevated HR and BP Hydralazine - urinary retention Spironolactone - muscle cramping Doxazosin - would not take due to side effects listed in PI (weight gain, fatigue)    Past Medical History:  Diagnosis Date   Abnormal cervical cytology 09/26/2011   LGSIL in 2011, patient reports repeat pap was normal, has had intermittent abnormals with bx in past but always normalized, has had cryotherapy with good results Menarche at 6, irregular without birth control at times, very heavy LMP 08/26/2011 No gyn concerns, MGM last year normal    ADD (attention deficit disorder)    Allergy    seasonal   Anemia    hx of none recent per pt on 12-27-2020   Asthma    mild, intermittent   Chicken pox as a child   Claustrophobia 12/27/2020   Concussion 09/24/2010   DJD (degenerative joint disease)    lower back   Family history of heart disease    Female bladder prolapse, acquired 09/24/2010   Fibromyalgia    Genital herpes 12/27/2020   Headache 09/26/2013   History of concussion 09/24/2010   headaches  x 8 months  and  memeory problems for months no current residual from   History of kidney stones    History of migraines 09/24/2010    History of postpartum depression 2002   HSV-2 infection 09/24/2010   HTN (hypertension) 09/24/2010   Hyperlipidemia    Hypertension    Hypertensive urgency 09/24/2010   hypertension   Hypertriglyceridemia 08/11/2019   Hypothyroidism    Interstitial cystitis 02/18/2011   Left shoulder pain 05/27/2016   Migraine 09/24/2010   Migraine aura, persistent, with cerebral infarct, status over 72 hours 09/24/2010   Myalgia 06/08/2018   Neck pain, musculoskeletal 12/16/2010   oa  and djd no limited neck motion   OSA (obstructive sleep apnea) 08/28/2020   using auto set 6 to 17   Overweight 05/29/2015   Palpitations 11/20/2021   Pharyngitis    sore throat and hurts when yawns since nov t & s surgery is improving   Pneumonia    last time 2012   Poor concentration 12/16/2010   Recurrent UTI    last uti 1st of  december   Self-catheterizes urinary bladder 12/27/2020   prn   Subacute and chronic vaginitis 09/24/2010   TIA (transient ischemic attack) 2015   saw dr berry none since   Tick bite 12/04/2013   rocky mountain spotted fever x 3 months last time 2015   Urinary incontinence    weras pads   Vaginitis and vulvovaginitis 05/21/2014   Wears glasses    or contacts    Past Surgical History:  Procedure Laterality Date   ABDOMINAL SACROCOLPOPEXY N/A 12/31/2020   Procedure: ABDOMINO SACROCOLPOPEXY;  Surgeon: Marguerita Beards, MD;  Location: [facility];  Service: Gynecology;  Laterality: N/A;   ANTERIOR AND POSTERIOR REPAIR  2003   colonscopy  2017   polyps removed due now   CYSTOSCOPY N/A 12/31/2020   Procedure: CYSTOSCOPY;  Surgeon: Marguerita Beards, MD;  Location: [facility];  Service: Gynecology;  Laterality: N/A;   INCONTINENCE SURGERY  01/13/2001   ROBOTIC ASSISTED TOTAL HYSTERECTOMY N/A 12/31/2020   Procedure: XI ROBOTIC ASSISTED TOTAL HYSTERECTOMY with bilateral salpingectomy;  Surgeon: Marguerita Beards, MD;  Location: [facility];  Service: Gynecology;  Laterality: N/A;   TONSILLECTOMY  11/15/2020   and adenoids removed due to constant infections at [facility] [place]    Current Medications: Current Meds  Medication Sig   acyclovir (ZOVIRAX) 200 MG capsule TAKE 1 CAPSULE BY MOUTH TWICE A DAY INS LIMIT OF 30 DAYS   albuterol (VENTOLIN HFA) 108 (90 Base) MCG/ACT inhaler Inhale 2 puffs into the lungs every 6 (six) hours as needed.   ALPRAZolam (XANAX) 0.5 MG tablet 1 tablet by mouth 30 minutes prior to flight.   amoxicillin-clavulanate (AUGMENTIN) 875-125 MG tablet Take 1 tablet by mouth 2 (two) times daily.   amphetamine-dextroamphetamine (ADDERALL) 20 MG tablet Take 1 tablet (20 mg total) by mouth 2 (two) times daily. September 2023   amphetamine-dextroamphetamine (ADDERALL) 20 MG tablet Take 1 tablet (20 mg total) by mouth 2 (two) times daily. August 2023   amphetamine-dextroamphetamine (ADDERALL) 20 MG tablet Take 1 tablet (20 mg total) by mouth 2 (two) times daily. July 2023   budesonide-formoterol (SYMBICORT) 160-4.5 MCG/ACT inhaler TAKE 2 PUFFS BY MOUTH TWICE A DAY   Cyanocobalamin (VITAMIN B 12 PO) Take by mouth. Vitamin b 12 subligual daily   EPINEPHrine 0.3 mg/0.3 mL IJ SOAJ injection Inject 0.3 mg into the muscle as needed for anaphylaxis.   fluconazole (DIFLUCAN) 150 MG tablet Take 1 tablet (150 mg total) by mouth once a week. Takes prn   HYDROcodone-acetaminophen (NORCO/VICODIN) 5-325 MG tablet Take 1 tablet by mouth every 6 (six) hours as needed for moderate pain.   hydrOXYzine (ATARAX) 50 MG tablet Take 1 tablet (50 mg total) by mouth every 6 (six) hours as needed for anxiety.   lidocaine (XYLOCAINE) 5 % ointment Apply 1 application topically daily as needed.   MAGNESIUM PO Take by mouth. Otc daily   Multiple Vitamins-Minerals (MULTIVITAMIN WITH MINERALS) tablet Take 1 tablet by mouth daily.   Naltrexone HCl, Pain, 1.5 MG CAPS Take 1-2 tablets by mouth daily.   nebivolol  (BYSTOLIC) 5 MG tablet 1 tablet by mouth once daily as needed for systolic blood pressure >160.   neomycin-polymyxin b-dexamethasone (MAXITROL) 3.5-10000-0.1 SUSP Place 1 drop into both eyes in the morning, at noon, in the evening, and at bedtime.   nitrofurantoin, macrocrystal-monohydrate, (MACROBID) 100 MG capsule TAKE 1 CAPSULE FOR 1 DOSE AS NEEDED FOR PREVENTION OF UTI *AFTER SELF CATH, SWIMMING,  HOT TUB, SEX*   Norethindrone Acetate-Ethinyl Estrad-FE (BLISOVI 24 FE) 1-20 MG-MCG(24) tablet Take 1 tablet by mouth daily.   ondansetron (ZOFRAN) 4 MG tablet Take 1 tablet (4 mg total) by mouth every 8 (eight) hours as needed for nausea or vomiting. (Patient taking differently: Take 4 mg by mouth every 8 (eight) hours as needed for nausea or vomiting. For after surgery)   SYNTHROID 137 MCG tablet Take 1 tablet (137 mcg total) by mouth daily.   [DISCONTINUED] cloNIDine (CATAPRES) 0.1 MG tablet Take 1 tablet (0.1 mg total) by mouth 3 (three) times daily.   [DISCONTINUED] meloxicam (MOBIC) 7.5 MG tablet Take 1 tablet (7.5 mg total) by mouth daily.     Allergies:   Chocolate, Peanut-containing drug products, Clindamycin/lincomycin, Influenza vaccines, Bystolic [nebivolol hcl], Doxazosin, Enalapril, Hydralazine, Hydrochlorothiazide, Losartan, Spiractone [spironolactone], Terazol [terconazole], and Latex   Social History   Socioeconomic History   Marital status: Married    Spouse name: Not on file   Number of children: Not on file   Years of education: Not on file   Highest education level: Not on file  Occupational History   Not on file  Tobacco Use   Smoking status: Never   Smokeless tobacco: Never  Vaping Use   Vaping Use: Never used  Substance and Sexual Activity   Alcohol use: No    Alcohol/week: 0.0 standard drinks of alcohol    Comment: 0   Drug use: No   Sexual activity: Yes    Partners: Male    Birth control/protection: Pill  Other Topics Concern   Not on file  Social History  Narrative   Lives with husband 2 children in a one story home.     Works as a Geophysicist/field seismologist.     Education: college.   Social Determinants of Health   Financial Resource Strain: Not on file  Food Insecurity: Not on file  Transportation Needs: Not on file  Physical Activity: Not on file  Stress: Not on file  Social Connections: Not on file     Family History: The patient's family history includes Alcohol abuse in her father and paternal grandfather; Crohn's disease in her maternal aunt; Dementia (age of onset: 76) in her maternal grandfather; Heart attack in her father and paternal grandfather; Heart attack (age of onset: 21) in her brother; Heart disease in her father, maternal grandmother, and paternal grandfather; Heart disease (age of onset: 64) in her brother; Heart failure in her maternal grandfather and maternal grandmother; Hyperlipidemia in her brother, father, mother, and paternal grandfather; Hypertension in her brother, father, and paternal grandfather; Kidney disease in her father; Osteoporosis in her mother; Stroke in her maternal grandfather and paternal grandfather; Valvular heart disease in her maternal grandmother. There is no history of Colon cancer, Stomach cancer, Esophageal cancer, or Pancreatic cancer.  ROS:   Please see the history of present illness.    (+) Asthma (+) Swelling All other systems reviewed and are negative.  EKGs/Labs/Other Studies Reviewed:    Coronary Calcium Score 08/30/2019: FINDINGS: Non-cardiac: See separate report from [facility]. Ascending Aorta: Pericardium: Normal Coronary arteries: No coronary calcification. IMPRESSION: Coronary calcium score of 0. This was 0 percentile for age and sex matched control.  EKG:  EKG was not ordered today 08/28/2020: Sinus rhythm. Rate 96 bpm. 08/11/2019: sinus rhythm.  Rate 88 bpm.  Recent Labs: 10/31/2021: ALT 27; BUN 14; Creatinine, Ser 0.76; Hemoglobin 15.2; Platelets 181.0;  Potassium 3.8; Sodium 137; TSH 1.03   Recent Lipid  Panel    Component Value Date/Time   CHOL 265 (H) 10/31/2021 1129   TRIG 197.0 (H) 10/31/2021 1129   HDL 45.20 10/31/2021 1129   CHOLHDL 6 10/31/2021 1129   VLDL 39.4 10/31/2021 1129   LDLCALC 180 (H) 10/31/2021 1129   LDLDIRECT 177.0 07/29/2021 1210    Physical Exam:    VS:  BP (!) 186/142   Pulse 100   Ht 5\' 2"  (1.575 m)   Wt 152 lb (68.9 kg)   SpO2 99%   BMI 27.80 kg/m  , BMI Body mass index is 27.8 kg/m. GENERAL:  Well appearing HEENT: Pupils equal round and reactive, fundi not visualized, oral mucosa unremarkable NECK:  No jugular venous distention, waveform within normal limits, carotid upstroke brisk and symmetric, no bruits, no thyromegaly LUNGS:  Clear to auscultation bilaterally HEART:  RRR.  PMI not displaced or sustained,S1 and S2 within normal limits, no S3, no S4, no clicks, no rubs, no murmurs ABD:  Flat, positive bowel sounds normal in frequency in pitch, no bruits, no rebound, no guarding, no midline pulsatile mass, no hepatomegaly, no splenomegaly EXT:  2 plus pulses throughout, no edema, no cyanosis no clubbing SKIN:  No rashes no nodules NEURO:  Cranial nerves II through XII grossly intact, motor grossly intact throughout PSYCH:  Cognitively intact, oriented to person place and time   ASSESSMENT:    1. Hypertensive urgency   2. Palpitations      PLAN:   Hypertensive urgency Blood pressure is much higher today than usual.  She is having some back pain and spasms and she thinks that this is contributing.  She has tolerated clonidine really well.  BP has been much better controlled at home.  She occasionally has ot take half a clonidine tablet but overall she has been much better.  Continue enalapril and clonidine for now.  She has been taking 0.05 mg of clonidine in the afternoons and will increase this to 0.1 mg.  She has nebivolol to take on an as-needed basis only.  Catecholamines and metanephrines  have been negative.  She had renal artery Dopplers remotely that were negative.  Repeat testing is pending.  She also had no adenomas on abdominal imaging in 2021.  Palpitations She has been experiencing palpitations lasting up to 15 minutes.  I sometimes happens when lying in bed and causes chest pressure.  It has resolved since she started taking a supplements.  Recommend getting a Jodelle Red mobile to track her rhythm if it happens again.   Disposition:  FU with Layia Walla C. Oval Linsey, MD, [facility] in 3-4 months.  Will need to be seen sooner if BP remains elevated.   Medication Adjustments/Labs and Tests Ordered: Current medicines are reviewed at length with the patient today.  Concerns regarding medicines are outlined above.  Orders Placed This Encounter  Procedures   EKG 12-Lead     Meds ordered this encounter  Medications   cloNIDine (CATAPRES) 0.1 MG tablet    Sig: Take 1 tablet (0.1 mg total) by mouth 3 (three) times daily.    Dispense:  270 tablet    Refill:  2    Signed, Skeet Latch, MD  11/20/2021 1:08 PM    [facility]

## 2021-11-20 NOTE — Assessment & Plan Note (Signed)
She has been experiencing palpitations lasting up to 15 minutes.  I sometimes happens when lying in bed and causes chest pressure.  It has resolved since she started taking a supplements.  Recommend getting a Kristen [name] mobile to track her rhythm if it happens again.

## 2021-11-20 NOTE — Patient Instructions (Signed)
Medication Instructions:  TAKE- Clonidine 0.1 mg by mouth three times a day  *If you need a refill on your cardiac medications before your next appointment, please call your pharmacy*   Lab Work: None Ordered  If you have labs (blood work) drawn today and your tests are completely normal, you will receive your results only by: MyChart Message (if you have MyChart) OR A paper copy in the mail If you have any lab test that is abnormal or we need to change your treatment, we will call you to review the results.   Testing/Procedures: None Ordered   Follow-Up: At [facility], you and your health needs are our priority.  As part of our continuing mission to provide you with exceptional heart care, we have created designated Provider Care Teams.  These Care Teams include your primary Cardiologist (physician) and Advanced Practice Providers (APPs -  Physician Assistants and Nurse Practitioners) who all work together to provide you with the care you need, when you need it.  We recommend signing up for the patient portal called "MyChart".  Sign up information is provided on this After Visit Summary.  MyChart is used to connect with patients for Virtual Visits (Telemedicine).  Patients are able to view lab/test results, encounter notes, upcoming appointments, etc.  Non-urgent messages can be sent to your provider as well.   To learn more about what you can do with MyChart, go to [email or web address].    Your next appointment:   4 month(s)  The format for your next appointment:   In Person  Provider:   Chilton Si, MD    Other Instructions

## 2021-11-20 NOTE — Assessment & Plan Note (Addendum)
Blood pressure is much higher today than usual.  She is having some back pain and spasms and she thinks that this is contributing.  She has tolerated clonidine really well.  BP has been much better controlled at home.  She occasionally has ot take half a clonidine tablet but overall she has been much better.  Continue enalapril and clonidine for now.  She has been taking 0.05 mg of clonidine in the afternoons and will increase this to 0.1 mg.  She has nebivolol to take on an as-needed basis only.  Catecholamines and metanephrines have been negative.  She had renal artery Dopplers remotely that were negative.  Repeat testing is pending.  She also had no adenomas on abdominal imaging in 2021.

## 2021-11-25 ENCOUNTER — Ambulatory Visit (HOSPITAL_COMMUNITY)
Admission: RE | Admit: 2021-11-25 | Discharge: 2021-11-25 | Disposition: A | Discharge status: Home / Self Care | Payer: BC Managed Care – PPO | Source: Ambulatory Visit | Attending: Family | Admitting: Family

## 2021-11-25 DIAGNOSIS — I10 Essential (primary) hypertension: Secondary | ICD-10-CM | POA: Diagnosis not present

## 2021-11-26 ENCOUNTER — Telehealth: Payer: Self-pay | Admitting: Family

## 2021-11-26 ENCOUNTER — Other Ambulatory Visit: Payer: Self-pay

## 2021-11-26 ENCOUNTER — Ambulatory Visit (AMBULATORY_SURGERY_CENTER): Payer: Self-pay

## 2021-11-26 VITALS — Ht 62.0 in | Wt 155.0 lb

## 2021-11-26 DIAGNOSIS — Z86010 Personal history of colonic polyps: Secondary | ICD-10-CM

## 2021-11-26 MED ORDER — NA SULFATE-K SULFATE-MG SULF 17.5-3.13-1.6 GM/177ML PO SOLN: 1.0000 | Freq: Once | ORAL | 0 refills | Status: AC

## 2021-11-26 NOTE — Telephone Encounter (Signed)
See mychart.  

## 2021-11-26 NOTE — Progress Notes (Signed)
Denies allergies to eggs or soy products. Denies complication of anesthesia or sedation. Denies use of weight loss medication. Denies use of O2.   Emmi instructions given for colonoscopy.  

## 2021-11-28 ENCOUNTER — Telehealth: Payer: Self-pay | Admitting: Family

## 2021-11-28 DIAGNOSIS — Q6102 Congenital multiple renal cysts: Secondary | ICD-10-CM | POA: Insufficient documentation

## 2021-11-28 NOTE — Telephone Encounter (Signed)
See mychart.  

## 2021-11-29 DIAGNOSIS — R070 Pain in throat: Secondary | ICD-10-CM | POA: Diagnosis not present

## 2021-11-29 DIAGNOSIS — K219 Gastro-esophageal reflux disease without esophagitis: Secondary | ICD-10-CM | POA: Diagnosis not present

## 2021-11-29 NOTE — Telephone Encounter (Signed)
See mychart.  

## 2021-12-04 DIAGNOSIS — Z1231 Encounter for screening mammogram for malignant neoplasm of breast: Secondary | ICD-10-CM | POA: Diagnosis not present

## 2021-12-04 LAB — HM MAMMOGRAPHY

## 2021-12-10 ENCOUNTER — Encounter: Payer: Self-pay | Admitting: *Deleted

## 2021-12-13 DIAGNOSIS — M7062 Trochanteric bursitis, left hip: Secondary | ICD-10-CM | POA: Diagnosis not present

## 2021-12-16 DIAGNOSIS — R319 Hematuria, unspecified: Secondary | ICD-10-CM | POA: Diagnosis not present

## 2021-12-16 DIAGNOSIS — Z1272 Encounter for screening for malignant neoplasm of vagina: Secondary | ICD-10-CM | POA: Diagnosis not present

## 2021-12-16 DIAGNOSIS — Z1151 Encounter for screening for human papillomavirus (HPV): Secondary | ICD-10-CM | POA: Diagnosis not present

## 2021-12-16 DIAGNOSIS — N951 Menopausal and female climacteric states: Secondary | ICD-10-CM | POA: Diagnosis not present

## 2021-12-16 DIAGNOSIS — Z6828 Body mass index (BMI) 28.0-28.9, adult: Secondary | ICD-10-CM | POA: Diagnosis not present

## 2021-12-16 DIAGNOSIS — Z01419 Encounter for gynecological examination (general) (routine) without abnormal findings: Secondary | ICD-10-CM | POA: Diagnosis not present

## 2021-12-19 ENCOUNTER — Encounter: Payer: Self-pay | Admitting: Gastroenterology

## 2021-12-24 ENCOUNTER — Ambulatory Visit (AMBULATORY_SURGERY_CENTER): Payer: BC Managed Care – PPO | Admitting: Gastroenterology

## 2021-12-24 ENCOUNTER — Encounter: Payer: Self-pay | Admitting: Gastroenterology

## 2021-12-24 VITALS — BP 147/96 | HR 57 | Temp 97.7°F | Resp 18 | Ht 62.0 in | Wt 155.0 lb

## 2021-12-24 DIAGNOSIS — K6389 Other specified diseases of intestine: Secondary | ICD-10-CM | POA: Diagnosis not present

## 2021-12-24 DIAGNOSIS — Z09 Encounter for follow-up examination after completed treatment for conditions other than malignant neoplasm: Secondary | ICD-10-CM

## 2021-12-24 DIAGNOSIS — Z860100 Personal history of colon polyps, unspecified: Secondary | ICD-10-CM

## 2021-12-24 DIAGNOSIS — K5730 Diverticulosis of large intestine without perforation or abscess without bleeding: Secondary | ICD-10-CM

## 2021-12-24 DIAGNOSIS — Z1211 Encounter for screening for malignant neoplasm of colon: Secondary | ICD-10-CM | POA: Diagnosis not present

## 2021-12-24 MED ORDER — SODIUM CHLORIDE 0.9 % IV SOLN: 500.0000 mL | Freq: Once | INTRAVENOUS | Status: DC

## 2021-12-24 NOTE — Progress Notes (Signed)
Report to PACU, RN, vss, BBS= Clear.  

## 2021-12-24 NOTE — Progress Notes (Signed)
Called to room to assist during endoscopic procedure.  Patient ID and intended procedure confirmed with present staff. Received instructions for my participation in the procedure from the performing physician.  

## 2021-12-24 NOTE — Progress Notes (Signed)
GASTROENTEROLOGY PROCEDURE H&P NOTE   Primary Care Physician: Bradd Canary, MD    Reason for Procedure:  Colon Cancer screening  Plan:    Colonoscopy  Patient is appropriate for endoscopic procedure(s) in the ambulatory ([facility]) setting.  The nature of the procedure, as well as the risks, benefits, and alternatives were carefully and thoroughly reviewed with the patient. Ample time for discussion and questions allowed. The patient understood, was satisfied, and agreed to proceed.     HPI: Kristen [name] is a 46 y.o. female who presents for colonoscopy for routine Colon Cancer screening.  Colonoscopy approximately 10 years ago notable for hyperplastic polyps. No known family history of colon cancer or related malignancy.  Maternal aunt with Crohn's disease.  Separately, history of abdominal bloating and constipation s/p hysterectomy and abdominal sacrocolpopexy 12/2020 and resolved after taking INNO cleanse 1 to 2 caps daily.  Past Medical History:  Diagnosis Date   Abnormal cervical cytology 09/26/2011   LGSIL in 2011, patient reports repeat pap was normal, has had intermittent abnormals with bx in past but always normalized, has had cryotherapy with good results Menarche at 17, irregular without birth control at times, very heavy LMP 08/26/2011 No gyn concerns, MGM last year normal    ADD (attention deficit disorder)    Allergy    seasonal   Anemia    hx of none recent per pt on 12-27-2020   Asthma    mild, intermittent   Chicken pox as a child   Chronic kidney disease    Claustrophobia 12/27/2020   Concussion 09/24/2010   DJD (degenerative joint disease)    lower back   Family history of heart disease    Female bladder prolapse, acquired 09/24/2010   Fibromyalgia    Genital herpes 12/27/2020   GERD (gastroesophageal reflux disease)    Headache 09/26/2013   History of concussion 09/24/2010   headaches  x 8 months  and  memeory problems for months no current residual  from   History of kidney stones    History of migraines 09/24/2010   History of postpartum depression 2002   HSV-2 infection 09/24/2010   HTN (hypertension) 09/24/2010   Hyperlipidemia    Hypertension    Hypertensive urgency 09/24/2010   hypertension   Hypertriglyceridemia 08/11/2019   Hypothyroidism    Interstitial cystitis 02/18/2011   Left shoulder pain 05/27/2016   Migraine 09/24/2010   Migraine aura, persistent, with cerebral infarct, status over 72 hours 09/24/2010   Myalgia 06/08/2018   Neck pain, musculoskeletal 12/16/2010   oa  and djd no limited neck motion   OSA (obstructive sleep apnea) 08/28/2020   using auto set 6 to 17   Overweight 05/29/2015   Palpitations 11/20/2021   Pharyngitis    sore throat and hurts when yawns since nov t & s surgery is improving   Pneumonia    last time 2012   Poor concentration 12/16/2010   Recurrent UTI    last uti 1st of december   Self-catheterizes urinary bladder 12/27/2020   prn   Sleep apnea    Stroke (HCC) 2015   Subacute and chronic vaginitis 09/24/2010   TIA (transient ischemic attack) 2015   saw dr berry none since   Tick bite 12/04/2013   rocky [place] spotted fever x 3 months last time 2015   Urinary incontinence    weras pads   Vaginitis and vulvovaginitis 05/21/2014   Wears glasses    or contacts    Past Surgical  History:  Procedure Laterality Date   ABDOMINAL SACROCOLPOPEXY N/A 12/31/2020   Procedure: ABDOMINO SACROCOLPOPEXY;  Surgeon: Marguerita Beards, MD;  Location: [facility];  Service: Gynecology;  Laterality: N/A;   ANTERIOR AND POSTERIOR REPAIR  2003   colonscopy  2017   polyps removed due now   CYSTOSCOPY N/A 12/31/2020   Procedure: CYSTOSCOPY;  Surgeon: Marguerita Beards, MD;  Location: [facility];  Service: Gynecology;  Laterality: N/A;   INCONTINENCE SURGERY  01/13/2001   ROBOTIC ASSISTED TOTAL HYSTERECTOMY N/A 12/31/2020   Procedure: XI ROBOTIC ASSISTED  TOTAL HYSTERECTOMY with bilateral salpingectomy;  Surgeon: Marguerita Beards, MD;  Location: [facility];  Service: Gynecology;  Laterality: N/A;   TONSILLECTOMY  11/15/2020   and adenoids removed due to constant infections at [facility] [place]    Prior to Admission medications   Medication Sig Start Date End Date Taking? Authorizing Provider  acyclovir (ZOVIRAX) 200 MG capsule TAKE 1 CAPSULE BY MOUTH TWICE A DAY INS LIMIT OF 30 DAYS 06/12/21   Bradd Canary, MD  albuterol (VENTOLIN HFA) 108 (90 Base) MCG/ACT inhaler Inhale 2 puffs into the lungs every 6 (six) hours as needed. 01/11/21   Bradd Canary, MD  ALPRAZolam Prudy Feeler) 0.5 MG tablet 1 tablet by mouth 30 minutes prior to flight. 06/04/21   Sandford Craze, NP  amphetamine-dextroamphetamine (ADDERALL) 20 MG tablet Take 1 tablet (20 mg total) by mouth 2 (two) times daily. September 2023 07/29/21   Bradd Canary, MD  amphetamine-dextroamphetamine (ADDERALL) 20 MG tablet Take 1 tablet (20 mg total) by mouth 2 (two) times daily. August 2023 07/29/21   Bradd Canary, MD  amphetamine-dextroamphetamine (ADDERALL) 20 MG tablet Take 1 tablet (20 mg total) by mouth 2 (two) times daily. July 2023 07/29/21   Bradd Canary, MD  budesonide-formoterol [facility]) 160-4.5 MCG/ACT inhaler TAKE 2 PUFFS BY MOUTH TWICE A DAY 01/11/21   Bradd Canary, MD  cloNIDine (CATAPRES) 0.1 MG tablet Take 1 tablet (0.1 mg total) by mouth 3 (three) times daily. 11/20/21   Chilton Si, MD  cloNIDine (CATAPRES-TTS-1) 0.1 mg/24hr patch Place 1 patch (0.1 mg total) onto the skin once a week. Patient not taking: Reported on 11/20/2021 07/29/21   Bradd Canary, MD  Cyanocobalamin (VITAMIN B 12 PO) Take by mouth. Vitamin b 12 subligual daily    [provider]  EPINEPHrine 0.3 mg/0.3 mL IJ SOAJ injection Inject 0.3 mg into the muscle as needed for anaphylaxis. 01/11/21   Bradd Canary, MD  fluconazole (DIFLUCAN) 150 MG tablet  Take 1 tablet (150 mg total) by mouth once a week. Takes prn 01/11/21   Bradd Canary, MD  HYDROcodone-acetaminophen (NORCO/VICODIN) 5-325 MG tablet Take 1 tablet by mouth every 6 (six) hours as needed for moderate pain. 07/29/21   Bradd Canary, MD  hydrOXYzine (ATARAX) 50 MG tablet Take 1 tablet (50 mg total) by mouth every 6 (six) hours as needed for anxiety. Patient not taking: Reported on 11/26/2021 02/13/21   Marguerita Beards, MD  Hyoscyamine Sulfate SL 0.125 MG SUBL Place 1 tablet under the tongue every 6 (six) hours as needed. 07/29/21   Bradd Canary, MD  lidocaine (XYLOCAINE) 5 % ointment Apply 1 application topically daily as needed. 01/17/21   Bradd Canary, MD  liothyronine (CYTOMEL) 5 MCG tablet TAKE 1 TABLET BY MOUTH EVERY DAY Patient not taking: Reported on 11/20/2021 04/24/21   Bradd Canary, MD  MAGNESIUM PO Take by mouth. Otc daily    [provider]  meloxicam (MOBIC) 7.5 MG tablet TAKE 1 TABLET(7.5 MG) BY MOUTH DAILY 11/20/21   Bradd CanaryBlyth, Stacey A, MD  Multiple Vitamins-Minerals (MULTIVITAMIN WITH MINERALS) tablet Take 1 tablet by mouth daily.    [provider]  Naltrexone HCl, Pain, 1.5 MG CAPS Take 1-2 tablets by mouth daily. 10/31/21   Bradd CanaryBlyth, Stacey A, MD  nebivolol (BYSTOLIC) 5 MG tablet 1 tablet by mouth once daily as needed for systolic blood pressure >160. 1/30/865/24/23   Sandford Craze'Sullivan, Melissa, NP  neomycin-polymyxin b-dexamethasone (MAXITROL) 3.5-10000-0.1 SUSP Place 1 drop into both eyes in the morning, at noon, in the evening, and at bedtime. 06/18/20   Bradd CanaryBlyth, Stacey A, MD  nitrofurantoin, macrocrystal-monohydrate, (MACROBID) 100 MG capsule TAKE 1 CAPSULE FOR 1 DOSE AS NEEDED FOR PREVENTION OF UTI *AFTER SELF CATH, SWIMMING, HOT TUB, SEX* 06/12/21   Bradd CanaryBlyth, Stacey A, MD  Norethindrone Acetate-Ethinyl Estrad-FE (BLISOVI 24 FE) 1-20 MG-MCG(24) tablet Take 1 tablet by mouth daily. Patient not taking: Reported on 11/26/2021 01/11/21   Bradd CanaryBlyth, Stacey A, MD   ondansetron (ZOFRAN) 4 MG tablet Take 1 tablet (4 mg total) by mouth every 8 (eight) hours as needed for nausea or vomiting. Patient not taking: Reported on 11/26/2021 12/19/20   Marguerita BeardsSchroeder, Michelle N, MD  SYNTHROID 137 MCG tablet Take 1 tablet (137 mcg total) by mouth daily. 06/12/21   Bradd CanaryBlyth, Stacey A, MD    Current Outpatient Medications  Medication Sig Dispense Refill   acyclovir (ZOVIRAX) 200 MG capsule TAKE 1 CAPSULE BY MOUTH TWICE A DAY INS LIMIT OF 30 DAYS 60 capsule 11   albuterol (VENTOLIN HFA) 108 (90 Base) MCG/ACT inhaler Inhale 2 puffs into the lungs every 6 (six) hours as needed. 1 each 3   ALPRAZolam (XANAX) 0.5 MG tablet 1 tablet by mouth 30 minutes prior to flight. 4 tablet 0   amphetamine-dextroamphetamine (ADDERALL) 20 MG tablet Take 1 tablet (20 mg total) by mouth 2 (two) times daily. September 2023 60 tablet 0   amphetamine-dextroamphetamine (ADDERALL) 20 MG tablet Take 1 tablet (20 mg total) by mouth 2 (two) times daily. August 2023 60 tablet 0   amphetamine-dextroamphetamine (ADDERALL) 20 MG tablet Take 1 tablet (20 mg total) by mouth 2 (two) times daily. July 2023 60 tablet 0   budesonide-formoterol (SYMBICORT) 160-4.5 MCG/ACT inhaler TAKE 2 PUFFS BY MOUTH TWICE A DAY 10.2 each 5   cloNIDine (CATAPRES) 0.1 MG tablet Take 1 tablet (0.1 mg total) by mouth 3 (three) times daily. 270 tablet 2   cloNIDine (CATAPRES-TTS-1) 0.1 mg/24hr patch Place 1 patch (0.1 mg total) onto the skin once a week. (Patient not taking: Reported on 11/20/2021) 4 patch 3   Cyanocobalamin (VITAMIN B 12 PO) Take by mouth. Vitamin b 12 subligual daily     EPINEPHrine 0.3 mg/0.3 mL IJ SOAJ injection Inject 0.3 mg into the muscle as needed for anaphylaxis. 2 each 2   fluconazole (DIFLUCAN) 150 MG tablet Take 1 tablet (150 mg total) by mouth once a week. Takes prn 2 tablet 2   HYDROcodone-acetaminophen (NORCO/VICODIN) 5-325 MG tablet Take 1 tablet by mouth every 6 (six) hours as needed for moderate pain. 90  tablet 0   hydrOXYzine (ATARAX) 50 MG tablet Take 1 tablet (50 mg total) by mouth every 6 (six) hours as needed for anxiety. (Patient not taking: Reported on 11/26/2021) 5 tablet 0   Hyoscyamine Sulfate SL 0.125 MG SUBL Place 1 tablet under the tongue every  6 (six) hours as needed. 60 tablet 1   lidocaine (XYLOCAINE) 5 % ointment Apply 1 application topically daily as needed. 50 g 1   liothyronine (CYTOMEL) 5 MCG tablet TAKE 1 TABLET BY MOUTH EVERY DAY (Patient not taking: Reported on 11/20/2021) 90 tablet 1   MAGNESIUM PO Take by mouth. Otc daily     meloxicam (MOBIC) 7.5 MG tablet TAKE 1 TABLET(7.5 MG) BY MOUTH DAILY 14 tablet 0   Multiple Vitamins-Minerals (MULTIVITAMIN WITH MINERALS) tablet Take 1 tablet by mouth daily.     Naltrexone HCl, Pain, 1.5 MG CAPS Take 1-2 tablets by mouth daily. 60 capsule 1   nebivolol (BYSTOLIC) 5 MG tablet 1 tablet by mouth once daily as needed for systolic blood pressure >160. 30 tablet 3   neomycin-polymyxin b-dexamethasone (MAXITROL) 3.5-10000-0.1 SUSP Place 1 drop into both eyes in the morning, at noon, in the evening, and at bedtime. 5 mL 1   nitrofurantoin, macrocrystal-monohydrate, (MACROBID) 100 MG capsule TAKE 1 CAPSULE FOR 1 DOSE AS NEEDED FOR PREVENTION OF UTI *AFTER SELF CATH, SWIMMING, HOT TUB, SEX* 30 capsule 8   Norethindrone Acetate-Ethinyl Estrad-FE (BLISOVI 24 FE) 1-20 MG-MCG(24) tablet Take 1 tablet by mouth daily. (Patient not taking: Reported on 11/26/2021) 28 tablet 11   ondansetron (ZOFRAN) 4 MG tablet Take 1 tablet (4 mg total) by mouth every 8 (eight) hours as needed for nausea or vomiting. (Patient not taking: Reported on 11/26/2021) 10 tablet 0   SYNTHROID 137 MCG tablet Take 1 tablet (137 mcg total) by mouth daily. 90 tablet 1   No current facility-administered medications for this visit.    Allergies as of 12/24/2021 - Review Complete 11/26/2021  Allergen Reaction Noted   Chocolate Anaphylaxis 12/27/2020   Peanut-containing drug  products Anaphylaxis 10/21/2013   Clindamycin/lincomycin Nausea And Vomiting 12/12/2019   Influenza vaccines Rash 12/07/2018   Bystolic [nebivolol hcl]  12/27/2020   Doxazosin  12/27/2020   Enalapril  12/27/2020   Hydralazine  01/24/2020   Hydrochlorothiazide  12/27/2020   Losartan  12/27/2020   Spiractone [spironolactone]  12/27/2020   Terazol [terconazole]  04/23/2012   Latex Rash 12/31/2020    Family History  Problem Relation Age of Onset   Osteoporosis Mother    Hyperlipidemia Mother    Kidney disease Father    Heart attack Father    Hyperlipidemia Father    Hypertension Father    Alcohol abuse Father    Heart disease Father    Heart attack Brother 28   Hypertension Brother    Heart disease Brother 52       MI   Hyperlipidemia Brother    Crohn's disease Maternal Aunt    Heart disease Maternal Grandmother    Heart failure Maternal Grandmother    Valvular heart disease Maternal Grandmother    Dementia Maternal Grandfather 40       early stages   Stroke Maternal Grandfather    Heart failure Maternal Grandfather    Heart attack Paternal Grandfather    Stroke Paternal Grandfather    Heart disease Paternal Grandfather    Alcohol abuse Paternal Grandfather    Hyperlipidemia Paternal Grandfather    Hypertension Paternal Grandfather    Colon cancer Neg Hx    Stomach cancer Neg Hx    Esophageal cancer Neg Hx    Pancreatic cancer Neg Hx    Rectal cancer Neg Hx     Social History   Socioeconomic History   Marital status: Married    Spouse name: Not  on file   Number of children: Not on file   Years of education: Not on file   Highest education level: Not on file  Occupational History   Not on file  Tobacco Use   Smoking status: Never   Smokeless tobacco: Never  Vaping Use   Vaping Use: Never used  Substance and Sexual Activity   Alcohol use: No    Alcohol/week: 0.0 standard drinks of alcohol    Comment: 0   Drug use: No   Sexual activity: Yes    Partners:  Male    Birth control/protection: Pill  Other Topics Concern   Not on file  Social History Narrative   Lives with husband 2 children in a one story home.     Works as a Teacher, adult education.     Education: college.   Social Determinants of Health   Financial Resource Strain: Not on file  Food Insecurity: Not on file  Transportation Needs: Not on file  Physical Activity: Not on file  Stress: Not on file  Social Connections: Not on file  Intimate Partner Violence: Not on file    Physical Exam: Vital signs in last 24 hours: @LMP   (LMP Unknown)  GEN: NAD EYE: Sclerae anicteric ENT: MMM CV: Non-tachycardic Pulm: CTA b/l GI: Soft, NT/ND NEURO:  Alert & Oriented x 3   , DO [facility]   12/24/2021 12:58 PM

## 2021-12-24 NOTE — Patient Instructions (Signed)
Handout provided on diverticulosis.   Resume previous diet.  Continue present medications. Await pathology results.  Repeat colonoscopy in 10 years for screening purposes.  Return to GI office as needed.   YOU HAD AN ENDOSCOPIC PROCEDURE TODAY AT THE [facility]:   Refer to the procedure report that was given to you for any specific questions about what was found during the examination.  If the procedure report does not answer your questions, please call your gastroenterologist to clarify.  If you requested that your care partner not be given the details of your procedure findings, then the procedure report has been included in a sealed envelope for you to review at your convenience later.  YOU SHOULD EXPECT: Some feelings of bloating in the abdomen. Passage of more gas than usual.  Walking can help get rid of the air that was put into your GI tract during the procedure and reduce the bloating. If you had a lower endoscopy (such as a colonoscopy or flexible sigmoidoscopy) you may notice spotting of blood in your stool or on the toilet paper. If you underwent a bowel prep for your procedure, you may not have a normal bowel movement for a few days.  Please Note:  You might notice some irritation and congestion in your nose or some drainage.  This is from the oxygen used during your procedure.  There is no need for concern and it should clear up in a day or so.  SYMPTOMS TO REPORT IMMEDIATELY:  Following lower endoscopy (colonoscopy or flexible sigmoidoscopy):  Excessive amounts of blood in the stool  Significant tenderness or worsening of abdominal pains  Swelling of the abdomen that is new, acute  Fever of 100F or higher  For urgent or emergent issues, a gastroenterologist can be reached at any hour by calling [phone]. Do not use MyChart messaging for urgent concerns.    DIET:  We do recommend a small meal at first, but then you may proceed to your regular diet.  Drink  plenty of fluids but you should avoid alcoholic beverages for 24 hours.  ACTIVITY:  You should plan to take it easy for the rest of today and you should NOT DRIVE or use heavy machinery until tomorrow (because of the sedation medicines used during the test).    FOLLOW UP: Our staff will call the number listed on your records the next business day following your procedure.  We will call around 7:15- 8:00 am to check on you and address any questions or concerns that you may have regarding the information given to you following your procedure. If we do not reach you, we will leave a message.     If any biopsies were taken you will be contacted by phone or by letter within the next 1-3 weeks.  Please call us at [phone] if you have not heard about the biopsies in 3 weeks.    SIGNATURES/CONFIDENTIALITY: You and/or your care partner have signed paperwork which will be entered into your electronic medical record.  These signatures attest to the fact that that the information above on your After Visit Summary has been reviewed and is understood.  Full responsibility of the confidentiality of this discharge information lies with you and/or your care-partner.

## 2021-12-24 NOTE — Progress Notes (Signed)
Pt's states no medical or surgical changes since previsit or office visit.   VS completed by DT.  

## 2021-12-24 NOTE — Op Note (Signed)
[facility] Patient Name: Kristen [name] Procedure Date: 12/24/2021 1:32 PM MRN: [id number] Endoscopist: Doristine LocksVito Leinaala Catanese , MD, [phone] Age: [age] Referring MD:  Date of Birth: 04/10/1975 Gender: Female Account #: [id number] Procedure:                Colonoscopy Indications:              Screening for colorectal malignant neoplasm                           Last colonoscopy was 10 years ago and notable for                            hyperplastic polyps. Medicines:                Monitored Anesthesia Care Procedure:                Pre-Anesthesia Assessment:                           - Prior to the procedure, a History and Physical                            was performed, and patient medications and                            allergies were reviewed. The patient's tolerance of                            previous anesthesia was also reviewed. The risks                            and benefits of the procedure and the sedation                            options and risks were discussed with the patient.                            All questions were answered, and informed consent                            was obtained. Prior Anticoagulants: The patient has                            taken no anticoagulant or antiplatelet agents. ASA                            Grade Assessment: II - A patient with mild systemic                            disease. After reviewing the risks and benefits,                            the patient was deemed in satisfactory condition to  undergo the procedure.                           After obtaining informed consent, the colonoscope                            was passed under direct vision. Throughout the                            procedure, the patient's blood pressure, pulse, and                            oxygen saturations were monitored continuously. The                            CF [id number] #[id number] was introduced through the  anus                            and advanced to the the terminal ileum. The                            colonoscopy was performed without difficulty. The                            patient tolerated the procedure well. The quality                            of the bowel preparation was good. The terminal                            ileum, ileocecal valve, appendiceal orifice, and                            rectum were photographed. Scope In: 1:36:42 PM Scope Out: 1:50:35 PM Scope Withdrawal Time: 0 hours 10 minutes 14 seconds  Total Procedure Duration: 0 hours 13 minutes 53 seconds  Findings:                 The perianal and digital rectal examinations were                            normal.                           A diffuse area of melanosis was found in the entire                            colon. Biopsies were taken with a cold forceps for                            histology. Estimated blood loss was minimal.                           Multiple large-mouthed and small-mouthed  diverticula were found in the transverse colon and                            ascending colon.                           Retroflexion in the rectum was not performed due to                            anatomy (narrow, short rectal vault).                           The terminal ileum appeared normal. Complications:            No immediate complications. Estimated Blood Loss:     Estimated blood loss was minimal. Impression:               - Melanosis in the colon. Biopsied.                           - Diverticulosis in the transverse colon and in the                            ascending colon.                           - The examined portion of the ileum was normal. Recommendation:           - Patient has a contact number available for                            emergencies. The signs and symptoms of potential                            delayed complications were discussed with the                             patient. Return to normal activities tomorrow.                            Written discharge instructions were provided to the                            patient.                           - Resume previous diet.                           - Continue present medications.                           - Await pathology results.                           - Repeat colonoscopy in 10 years for screening  purposes.                           - Return to GI office PRN. Doristine Locks, MD 12/24/2021 1:55:11 PM

## 2021-12-25 ENCOUNTER — Telehealth: Payer: Self-pay

## 2021-12-25 ENCOUNTER — Other Ambulatory Visit: Payer: Self-pay | Admitting: Gastroenterology

## 2021-12-25 ENCOUNTER — Other Ambulatory Visit: Payer: Self-pay

## 2021-12-25 MED ORDER — DICYCLOMINE HCL 10 MG PO CAPS: 10.0000 mg | ORAL_CAPSULE | Freq: Four times a day (QID) | ORAL | 0 refills | Status: AC | PRN

## 2021-12-25 NOTE — Telephone Encounter (Signed)
Received message from Dr. Barron Alvine stating we can call in Bentyl 10 mg prn q6. But if truly 8/10 pain, then she really should be going to ER for CT and evaluation. Called pt to give her message. Pt stated that she didn't want to go to the ER because of the long wait. Pt also stated that her pain is not always an 8/10. Pt requested a same day office appointment. Let pt know that Dr. Barron Alvine is not in the office today. Also let pt know that we can send in the bentyl and I will call her back tomorrow to see how she feels.  Pt verbalized understanding. Bentyl sent to pt's pharmacy.

## 2021-12-25 NOTE — Telephone Encounter (Signed)
Spoke with pt and pt states that pain has not improved. Pt woke up to significant pain this morning and had to cancel work for today. Pt took levsin but does not think it helped much. Pt states she took the levsin and fell asleep but woke up to pain in her Right abdomen slightly above the umbilical line. Pain is worse with movement. Pt rates pain at a 5/10 aching pain when she is completely still but when she moves pain increases to an 8/10 and feels very sharp. Pt denies any nausea except when she is experiencing the sharp pain. Pt reports the pain does not seem to be related to eating.

## 2021-12-25 NOTE — Telephone Encounter (Signed)
Inbound call from  patient calling to f/u from previous f/u call. Please advise.

## 2021-12-25 NOTE — Telephone Encounter (Signed)
  Follow up Call-     12/24/2021    1:07 PM  Call back number  Post procedure Call Back phone  # [phone]  Permission to leave phone message Yes     Patient questions:  Do you have a fever, pain , or abdominal swelling? Yes.   Pain Score  8 *  Have you tolerated food without any problems? Yes.    Have you been able to return to your normal activities? No.  Do you have any questions about your discharge instructions: Diet   No. Medications  No. Follow up visit  No.  Do you have questions or concerns about your Care? Yes.    Actions: * If pain score is 4 or above: Physician/ provider Notified : Vito Cirigliano, DO. When doing follow up call. Patient has complaints of pain in right upper abdomen. She rates it an 8 with movement, when resting it is a 6. She is passing gas and has taken Gas x. She ate soup yesterday. She describes her pain as feeling like her bowels are twisted, as she had that problem after a previous surgery. She has Levsin at home, I encouraged her to take one. No other complaints. Please advise, patient would like to talk to you.

## 2021-12-26 NOTE — Telephone Encounter (Signed)
Left message for pt to call back  °

## 2021-12-30 NOTE — Telephone Encounter (Signed)
Left message for pt to call back  °

## 2022-01-01 NOTE — Telephone Encounter (Signed)
Will await further communication from patient.  

## 2022-01-08 ENCOUNTER — Other Ambulatory Visit: Payer: Self-pay | Admitting: Family Medicine

## 2022-01-08 ENCOUNTER — Encounter: Payer: Self-pay | Admitting: Family Medicine

## 2022-01-08 DIAGNOSIS — M542 Cervicalgia: Secondary | ICD-10-CM | POA: Diagnosis not present

## 2022-01-08 DIAGNOSIS — M533 Sacrococcygeal disorders, not elsewhere classified: Secondary | ICD-10-CM | POA: Diagnosis not present

## 2022-01-08 MED ORDER — AMPHETAMINE-DEXTROAMPHETAMINE 20 MG PO TABS: 20.0000 mg | ORAL_TABLET | Freq: Two times a day (BID) | ORAL | 0 refills | Status: DC

## 2022-01-08 MED ORDER — NEOMYCIN-POLYMYXIN-DEXAMETH 3.5-10000-0.1 OP SUSP: 1.0000 [drp] | Freq: Four times a day (QID) | OPHTHALMIC | 1 refills | Status: DC

## 2022-01-08 MED ORDER — HYDROCODONE-ACETAMINOPHEN 5-325 MG PO TABS: 1.0000 | ORAL_TABLET | Freq: Four times a day (QID) | ORAL | 0 refills | Status: DC | PRN

## 2022-01-08 MED ORDER — LIDOCAINE 5 % EX OINT: 1.0000 | TOPICAL_OINTMENT | Freq: Every day | CUTANEOUS | 1 refills | Status: DC | PRN

## 2022-01-08 NOTE — Telephone Encounter (Signed)
Requesting: Adderall 20MG   Contract:07/29/21 UDS:07/29/21 Last Visit: 10/31/21 Next Visit: 02/03/22 Last Refill: 07/29/21 #60 and 0RF   Please Advise

## 2022-01-09 DIAGNOSIS — M533 Sacrococcygeal disorders, not elsewhere classified: Secondary | ICD-10-CM | POA: Diagnosis not present

## 2022-01-10 ENCOUNTER — Telehealth (HOSPITAL_BASED_OUTPATIENT_CLINIC_OR_DEPARTMENT_OTHER): Payer: Self-pay

## 2022-01-10 ENCOUNTER — Ambulatory Visit (HOSPITAL_BASED_OUTPATIENT_CLINIC_OR_DEPARTMENT_OTHER)
Admission: RE | Admit: 2022-01-10 | Discharge: 2022-01-10 | Disposition: A | Discharge status: Home / Self Care | Payer: BC Managed Care – PPO | Source: Ambulatory Visit | Attending: Family | Admitting: Family

## 2022-01-10 ENCOUNTER — Other Ambulatory Visit (HOSPITAL_BASED_OUTPATIENT_CLINIC_OR_DEPARTMENT_OTHER): Payer: BC Managed Care – PPO

## 2022-01-10 DIAGNOSIS — Q6102 Congenital multiple renal cysts: Secondary | ICD-10-CM | POA: Diagnosis not present

## 2022-01-10 DIAGNOSIS — N281 Cyst of kidney, acquired: Secondary | ICD-10-CM | POA: Diagnosis not present

## 2022-01-24 DIAGNOSIS — M545 Low back pain, unspecified: Secondary | ICD-10-CM | POA: Diagnosis not present

## 2022-01-24 DIAGNOSIS — M533 Sacrococcygeal disorders, not elsewhere classified: Secondary | ICD-10-CM | POA: Diagnosis not present

## 2022-01-24 DIAGNOSIS — M7062 Trochanteric bursitis, left hip: Secondary | ICD-10-CM | POA: Diagnosis not present

## 2022-02-02 NOTE — Assessment & Plan Note (Signed)
Supplement and monitor 

## 2022-02-02 NOTE — Assessment & Plan Note (Addendum)
Patient encouraged to maintain heart healthy diet, regular exercise, adequate sleep. Consider daily probiotics. Take medications as prescribed. Labs ordered and reviewed. Follows with GYN, [facility], for paps and MGMs. Will request records. Last [facility] 11/2021. Colonoscopy 12/2021 repeat in 10 years. She declines vaccinations.

## 2022-02-03 ENCOUNTER — Ambulatory Visit (INDEPENDENT_AMBULATORY_CARE_PROVIDER_SITE_OTHER): Payer: BC Managed Care – PPO | Admitting: Family Medicine

## 2022-02-03 VITALS — BP 132/78 | HR 86 | Temp 98.0°F | Resp 16 | Ht 62.0 in | Wt 160.0 lb

## 2022-02-03 DIAGNOSIS — R739 Hyperglycemia, unspecified: Secondary | ICD-10-CM | POA: Diagnosis not present

## 2022-02-03 DIAGNOSIS — M797 Fibromyalgia: Secondary | ICD-10-CM

## 2022-02-03 DIAGNOSIS — E559 Vitamin D deficiency, unspecified: Secondary | ICD-10-CM | POA: Diagnosis not present

## 2022-02-03 DIAGNOSIS — N951 Menopausal and female climacteric states: Secondary | ICD-10-CM

## 2022-02-03 DIAGNOSIS — E782 Mixed hyperlipidemia: Secondary | ICD-10-CM

## 2022-02-03 DIAGNOSIS — Z0000 Encounter for general adult medical examination without abnormal findings: Secondary | ICD-10-CM | POA: Diagnosis not present

## 2022-02-03 DIAGNOSIS — Z809 Family history of malignant neoplasm, unspecified: Secondary | ICD-10-CM

## 2022-02-03 DIAGNOSIS — E538 Deficiency of other specified B group vitamins: Secondary | ICD-10-CM

## 2022-02-03 DIAGNOSIS — R946 Abnormal results of thyroid function studies: Secondary | ICD-10-CM | POA: Diagnosis not present

## 2022-02-03 DIAGNOSIS — D649 Anemia, unspecified: Secondary | ICD-10-CM

## 2022-02-03 DIAGNOSIS — I10 Essential (primary) hypertension: Secondary | ICD-10-CM

## 2022-02-03 DIAGNOSIS — R41840 Attention and concentration deficit: Secondary | ICD-10-CM | POA: Diagnosis not present

## 2022-02-03 DIAGNOSIS — Z86010 Personal history of colonic polyps: Secondary | ICD-10-CM

## 2022-02-03 DIAGNOSIS — Z79899 Other long term (current) drug therapy: Secondary | ICD-10-CM | POA: Diagnosis not present

## 2022-02-03 LAB — T4, FREE: Free T4: 0.75 ng/dL (ref 0.60–1.60)

## 2022-02-03 LAB — VITAMIN D 25 HYDROXY (VIT D DEFICIENCY, FRACTURES): VITD: 49.65 ng/mL (ref 30.00–100.00)

## 2022-02-03 LAB — VITAMIN B12: Vitamin B-12: 956 pg/mL — ABNORMAL HIGH (ref 211–911)

## 2022-02-03 LAB — HEMOGLOBIN A1C: Hgb A1c MFr Bld: 5.9 % (ref 4.6–6.5)

## 2022-02-03 LAB — T3, FREE: T3, Free: 2.9 pg/mL (ref 2.3–4.2)

## 2022-02-03 MED ORDER — CLONIDINE HCL 0.1 MG PO TABS: 0.2000 mg | ORAL_TABLET | Freq: Three times a day (TID) | ORAL | 3 refills | Status: DC

## 2022-02-03 MED ORDER — NALTREXONE HCL (PAIN) 1.5 MG PO CAPS: 2.0000 | ORAL_CAPSULE | Freq: Every day | ORAL | 3 refills | Status: AC

## 2022-02-03 NOTE — Assessment & Plan Note (Signed)
Medications working better again, no changes

## 2022-02-03 NOTE — Assessment & Plan Note (Signed)
Recent colonoscopy clear of polyps but she had a spasm with the bowels catching she responded to Bentyl and it also helped her bladder spasm

## 2022-02-03 NOTE — Assessment & Plan Note (Signed)
Naltrexone has been helpful had to increase from 1.5 mg dose to 3 mg dose and is given permission to go to 4.5 mg daily

## 2022-02-03 NOTE — Assessment & Plan Note (Signed)
Palpitations, hot flashes are the most notable. While on topical HRT she felt better but she forgets to apply it bid so is considering patches or oral HRT she will let us know when she decides and we can prescribe

## 2022-02-03 NOTE — Patient Instructions (Signed)
Consider topical or oral HRT and let us know your preference  Preventive Care 38-47 Years Old, Female Preventive care refers to lifestyle choices and visits with your health care provider that can promote health and wellness. Preventive care visits are also called wellness exams. What can I expect for my preventive care visit? Counseling Your health care provider may ask you questions about your: Medical history, including: Past medical problems. Family medical history. Pregnancy history. Current health, including: Menstrual cycle. Method of birth control. Emotional well-being. Home life and relationship well-being. Sexual activity and sexual health. Lifestyle, including: Alcohol, nicotine or tobacco, and drug use. Access to firearms. Diet, exercise, and sleep habits. Work and work Statistician. Sunscreen use. Safety issues such as seatbelt and bike helmet use. Physical exam Your health care provider will check your: Height and weight. These may be used to calculate your BMI (body mass index). BMI is a measurement that tells if you are at a healthy weight. Waist circumference. This measures the distance around your waistline. This measurement also tells if you are at a healthy weight and may help predict your risk of certain diseases, such as type 2 diabetes and high blood pressure. Heart rate and blood pressure. Body temperature. Skin for abnormal spots. What immunizations do I need?  Vaccines are usually given at various ages, according to a schedule. Your health care provider will recommend vaccines for you based on your age, medical history, and lifestyle or other factors, such as travel or where you work. What tests do I need? Screening Your health care provider may recommend screening tests for certain conditions. This may include: Lipid and cholesterol levels. Diabetes screening. This is done by checking your blood sugar (glucose) after you have not eaten for a while  (fasting). Pelvic exam and Pap test. Hepatitis B test. Hepatitis C test. HIV (human immunodeficiency virus) test. STI (sexually transmitted infection) testing, if you are at risk. Lung cancer screening. Colorectal cancer screening. Mammogram. Talk with your health care provider about when you should start having regular mammograms. This may depend on whether you have a family history of breast cancer. BRCA-related cancer screening. This may be done if you have a family history of breast, ovarian, tubal, or peritoneal cancers. Bone density scan. This is done to screen for osteoporosis. Talk with your health care provider about your test results, treatment options, and if necessary, the need for more tests. Follow these instructions at home: Eating and drinking  Eat a diet that includes fresh fruits and vegetables, whole grains, lean protein, and low-fat dairy products. Take vitamin and mineral supplements as recommended by your health care provider. Do not drink alcohol if: Your health care provider tells you not to drink. You are pregnant, may be pregnant, or are planning to become pregnant. If you drink alcohol: Limit how much you have to 0-1 drink a day. Know how much alcohol is in your drink. In the [place] one drink equals one 12 oz bottle of beer (355 mL), one 5 oz glass of wine (148 mL), or one 1 oz glass of hard liquor (44 mL). Lifestyle Brush your teeth every morning and night with fluoride toothpaste. Floss one time each day. Exercise for at least 30 minutes 5 or more days each week. Do not use any products that contain nicotine or tobacco. These products include cigarettes, chewing tobacco, and vaping devices, such as e-cigarettes. If you need help quitting, ask your health care provider. Do not use drugs. If you are sexually active,  practice safe sex. Use a condom or other form of protection to prevent STIs. If you do not wish to become pregnant, use a form of birth control. If  you plan to become pregnant, see your health care provider for a prepregnancy visit. Take aspirin only as told by your health care provider. Make sure that you understand how much to take and what form to take. Work with your health care provider to find out whether it is safe and beneficial for you to take aspirin daily. Find healthy ways to manage stress, such as: Meditation, yoga, or listening to music. Journaling. Talking to a trusted person. Spending time with friends and family. Minimize exposure to UV radiation to reduce your risk of skin cancer. Safety Always wear your seat belt while driving or riding in a vehicle. Do not drive: If you have been drinking alcohol. Do not ride with someone who has been drinking. When you are tired or distracted. While texting. If you have been using any mind-altering substances or drugs. Wear a helmet and other protective equipment during sports activities. If you have firearms in your house, make sure you follow all gun safety procedures. Seek help if you have been physically or sexually abused. What's next? Visit your health care provider once a year for an annual wellness visit. Ask your health care provider how often you should have your eyes and teeth checked. Stay up to date on all vaccines. This information is not intended to replace advice given to you by your health care provider. Make sure you discuss any questions you have with your health care provider. Document Revised: 06/27/2020 Document Reviewed: 06/27/2020 [employer] Patient Education  [employer].

## 2022-02-03 NOTE — Progress Notes (Signed)
Subjective:   By signing my name below, I, Doylene Bode, attest that this documentation has been prepared under the direction and in the presence of Danise Edge, MD 02/03/22   Patient ID: Kristen [name], female    DOB: [birth year], 47 y.o.   MRN: [id number]  Chief Complaint  Patient presents with   Annual Exam    Annual Exam     HPI Patient is in today for a comprehensive physical exam.  Lump on Right Breast She reports a small lump on her right breast. It is soft and leaves a dimple in her surface tissue. She reports that the size fluctuates a small amount.  Heart Palpitations She is experiencing heart palpitations.  She is using OTC remedies which are resolving her palpitations and when she stops taking them the palpitations return.  Menopause She has stopped taking birth control as of last year. She was taking it since 2013. She is interested in starting Estratest.  Renal Cysts Her renal cysts are still present as of her last ultrasound of her kidneys on 01/11/2022.   HTN She is compliant with Clonidine, which is giving her relief. BP Readings from Last 3 Encounters:  02/03/22 132/78  12/24/21 (!) 147/96  11/20/21 (!) 186/142    Chronic Back Pain She received two injections in her lower back last month, which have given her relief from back pain.  Anxiety She is compliant with Naltrexone, which is giving her relief from anxiety.  She reports issues swallowing. She has consulted with her GI specialist with no solution found.  Colonoscopy: UTD. Last completed on 12/24/2021. Findings show the perianal and digital rectal examinations were normal. A diffuse area of melanosis was found in the entire colon. Biopsies were taken with a cold forceps for histology. Estimated blood loss was minimal. Multiple large-mouthed and small-mouthed diverticula were found in the transverse colon and ascending colon. Retroflexion in the rectum was not performed due to anatomy (narrow,  short rectal vault). The terminal ileum appeared normal. Repeat in 10 years. Pap Smear: Overdue. Last completed 03/18/2018. Repeat in 3 years. MGM: UTD. Last completed on 12/04/2021. Result was negative.  She reports that her cousin has a h/o pancreatic cancer. She reports no family history of breast cancer. She has not completed her advanced directives yet.    Past Medical History:  Diagnosis Date   Abnormal cervical cytology 09/26/2011   LGSIL in 2011, patient reports repeat pap was normal, has had intermittent abnormals with bx in past but always normalized, has had cryotherapy with good results Menarche at 17, irregular without birth control at times, very heavy LMP 08/26/2011 No gyn concerns, MGM last year normal    ADD (attention deficit disorder)    Allergy    seasonal   Anemia    hx of none recent per pt on 12-27-2020   Asthma    mild, intermittent   Chicken pox as a child   Chronic kidney disease    Claustrophobia 12/27/2020   Concussion 09/24/2010   DJD (degenerative joint disease)    lower back   Family history of heart disease    Female bladder prolapse, acquired 09/24/2010   Fibromyalgia    Genital herpes 12/27/2020   GERD (gastroesophageal reflux disease)    Headache 09/26/2013   History of concussion 09/24/2010   headaches  x 8 months  and  memeory problems for months no current residual from   History of kidney stones    History of migraines 09/24/2010  History of postpartum depression 2002   HSV-2 infection 09/24/2010   HTN (hypertension) 09/24/2010   Hyperlipidemia    Hypertension    Hypertensive urgency 09/24/2010   hypertension   Hypertriglyceridemia 08/11/2019   Hypothyroidism    Interstitial cystitis 02/18/2011   Left shoulder pain 05/27/2016   Migraine 09/24/2010   Migraine aura, persistent, with cerebral infarct, status over 72 hours 09/24/2010   Myalgia 06/08/2018   Neck pain, musculoskeletal 12/16/2010   oa  and djd no limited neck motion    OSA (obstructive sleep apnea) 08/28/2020   using auto set 6 to 17   Overweight 05/29/2015   Palpitations 11/20/2021   Pharyngitis    sore throat and hurts when yawns since nov t & s surgery is improving   Pneumonia    last time 2012   Poor concentration 12/16/2010   Recurrent UTI    last uti 1st of december   Self-catheterizes urinary bladder 12/27/2020   prn   Sleep apnea    Stroke ([place]) 2015   Subacute and chronic vaginitis 09/24/2010   TIA (transient ischemic attack) 2015   saw dr berry none since   Tick bite 12/04/2013   rocky [place] spotted fever x 3 months last time 2015   Urinary incontinence    weras pads   Vaginitis and vulvovaginitis 05/21/2014   Wears glasses    or contacts    Past Surgical History:  Procedure Laterality Date   ABDOMINAL SACROCOLPOPEXY N/A 12/31/2020   Procedure: ABDOMINO SACROCOLPOPEXY;  Surgeon: Jaquita Folds, MD;  Location: [facility];  Service: Gynecology;  Laterality: N/A;   ANTERIOR AND POSTERIOR REPAIR  2003   colonscopy  2017   polyps removed due now   CYSTOSCOPY N/A 12/31/2020   Procedure: CYSTOSCOPY;  Surgeon: Jaquita Folds, MD;  Location: [facility];  Service: Gynecology;  Laterality: N/A;   INCONTINENCE SURGERY  01/13/2001   ROBOTIC ASSISTED TOTAL HYSTERECTOMY N/A 12/31/2020   Procedure: XI ROBOTIC ASSISTED TOTAL HYSTERECTOMY with bilateral salpingectomy;  Surgeon: Jaquita Folds, MD;  Location: [facility];  Service: Gynecology;  Laterality: N/A;   TONSILLECTOMY  11/15/2020   and adenoids removed due to constant infections at [facility] [place]    Family History  Problem Relation Age of Onset   Osteoporosis Mother    Hyperlipidemia Mother    Kidney disease Father    Heart attack Father    Hyperlipidemia Father    Hypertension Father    Alcohol abuse Father    Heart disease Father    Heart attack Brother 10   Hypertension Brother    Heart  disease Brother 68       MI   Hyperlipidemia Brother    Crohn's disease Maternal Aunt    Heart disease Maternal Grandmother    Heart failure Maternal Grandmother    Valvular heart disease Maternal Grandmother    Dementia Maternal Grandfather 92       early stages   Stroke Maternal Grandfather    Heart failure Maternal Grandfather    Heart attack Paternal Grandfather    Stroke Paternal Grandfather    Heart disease Paternal Grandfather    Alcohol abuse Paternal Grandfather    Hyperlipidemia Paternal Grandfather    Hypertension Paternal Grandfather    Colon cancer Neg Hx    Stomach cancer Neg Hx    Esophageal cancer Neg Hx    Pancreatic cancer Neg Hx    Rectal cancer Neg Hx  Social History   Socioeconomic History   Marital status: Married    Spouse name: Not on file   Number of children: Not on file   Years of education: Not on file   Highest education level: Not on file  Occupational History   Not on file  Tobacco Use   Smoking status: Never   Smokeless tobacco: Never  Vaping Use   Vaping Use: Never used  Substance and Sexual Activity   Alcohol use: No    Alcohol/week: 0.0 standard drinks of alcohol    Comment: 0   Drug use: No   Sexual activity: Yes    Partners: Male    Birth control/protection: Pill  Other Topics Concern   Not on file  Social History Narrative   Lives with husband 2 children in a one story home.     Works as a Teacher, adult education.     Education: college.   Social Determinants of Health   Financial Resource Strain: Not on file  Food Insecurity: Not on file  Transportation Needs: Not on file  Physical Activity: Not on file  Stress: Not on file  Social Connections: Not on file  Intimate Partner Violence: Not on file    Outpatient Medications Prior to Visit  Medication Sig Dispense Refill   acyclovir (ZOVIRAX) 200 MG capsule TAKE 1 CAPSULE BY MOUTH TWICE A DAY INS LIMIT OF 30 DAYS 60 capsule 11   albuterol (VENTOLIN HFA) 108 (90 Base)  MCG/ACT inhaler Inhale 2 puffs into the lungs every 6 (six) hours as needed. 1 each 3   ALPRAZolam (XANAX) 0.5 MG tablet 1 tablet by mouth 30 minutes prior to flight. 4 tablet 0   amphetamine-dextroamphetamine (ADDERALL) 20 MG tablet Take 1 tablet (20 mg total) by mouth 2 (two) times daily. July 2023 60 tablet 0   amphetamine-dextroamphetamine (ADDERALL) 20 MG tablet Take 1 tablet (20 mg total) by mouth 2 (two) times daily. December 2023 60 tablet 0   budesonide-formoterol (SYMBICORT) 160-4.5 MCG/ACT inhaler TAKE 2 PUFFS BY MOUTH TWICE A DAY 10.2 each 5   Cyanocobalamin (VITAMIN B 12 PO) Take by mouth. Vitamin b 12 subligual daily     dicyclomine (BENTYL) 10 MG capsule Take 1 capsule (10 mg total) by mouth every 6 (six) hours as needed (abdominal pain). 90 capsule 0   EPINEPHrine 0.3 mg/0.3 mL IJ SOAJ injection Inject 0.3 mg into the muscle as needed for anaphylaxis. 2 each 2   fluconazole (DIFLUCAN) 150 MG tablet Take 1 tablet (150 mg total) by mouth once a week. Takes prn 2 tablet 2   HYDROcodone-acetaminophen (NORCO/VICODIN) 5-325 MG tablet Take 1 tablet by mouth every 6 (six) hours as needed for moderate pain. 90 tablet 0   hydrOXYzine (ATARAX) 50 MG tablet Take 1 tablet (50 mg total) by mouth every 6 (six) hours as needed for anxiety. 5 tablet 0   lidocaine (XYLOCAINE) 5 % ointment Apply 1 Application topically daily as needed. 50 g 1   liothyronine (CYTOMEL) 5 MCG tablet TAKE 1 TABLET BY MOUTH EVERY DAY 90 tablet 1   MAGNESIUM PO Take by mouth. Otc daily     meloxicam (MOBIC) 7.5 MG tablet TAKE 1 TABLET(7.5 MG) BY MOUTH DAILY 14 tablet 0   Multiple Vitamins-Minerals (MULTIVITAMIN WITH MINERALS) tablet Take 1 tablet by mouth daily.     nebivolol (BYSTOLIC) 5 MG tablet 1 tablet by mouth once daily as needed for systolic blood pressure >160. 30 tablet 3   neomycin-polymyxin b-dexamethasone (MAXITROL) 3.5-10000-0.1  SUSP Place 1 drop into both eyes in the morning, at noon, in the evening, and at  bedtime. 5 mL 1   nitrofurantoin, macrocrystal-monohydrate, (MACROBID) 100 MG capsule TAKE 1 CAPSULE FOR 1 DOSE AS NEEDED FOR PREVENTION OF UTI *AFTER SELF CATH, SWIMMING, HOT TUB, SEX* 30 capsule 8   Norethindrone Acetate-Ethinyl Estrad-FE (BLISOVI 24 FE) 1-20 MG-MCG(24) tablet Take 1 tablet by mouth daily. 28 tablet 11   ondansetron (ZOFRAN) 4 MG tablet Take 1 tablet (4 mg total) by mouth every 8 (eight) hours as needed for nausea or vomiting. 10 tablet 0   SYNTHROID 137 MCG tablet Take 1 tablet (137 mcg total) by mouth daily. 90 tablet 1   cloNIDine (CATAPRES) 0.1 MG tablet Take 1 tablet (0.1 mg total) by mouth 3 (three) times daily. 270 tablet 2   cloNIDine (CATAPRES-TTS-1) 0.1 mg/24hr patch Place 1 patch (0.1 mg total) onto the skin once a week. 4 patch 3   Naltrexone HCl, Pain, 1.5 MG CAPS Take 1-2 tablets by mouth daily. 60 capsule 1   No facility-administered medications prior to visit.    Allergies  Allergen Reactions   Chocolate Anaphylaxis    Trouble breathing facial swelling   Peanut-Containing Drug Products Anaphylaxis    All nuts of every kind   Clindamycin/Lincomycin Nausea And Vomiting    And diarrhea.    Influenza Vaccines Rash    Significant swelling in arm, rash, malaise, myalgias    Bystolic [Nebivolol Hcl]     Fatigue arm heaviness   Doxazosin     Would not take due to side effects listed in patient information   Enalapril     Swelling at higher doses fatigue   Hydralazine     Urinary retention and edema    Hydrochlorothiazide     Bladder issues and kidney stones   Losartan     Did not work   Company secretary [Spironolactone]     Leg cramps   Terazol [Terconazole]     Burning sensation   Latex Rash    Review of Systems  Constitutional:  Negative for fever.  HENT:  Negative for congestion, sinus pain and sore throat.   Respiratory:  Negative for hemoptysis, shortness of breath and wheezing.   Cardiovascular:  Positive for palpitations (Managing with OTC  remedies). Negative for chest pain.  Gastrointestinal:  Negative for abdominal pain, constipation, diarrhea, nausea and vomiting.       (+) Difficulty swallowing  Genitourinary:  Positive for hematuria (Frequent). Negative for flank pain and urgency.  Skin:        (+) Lump on right breast  Neurological:  Negative for headaches.       Objective:    Physical Exam Constitutional:      General: She is not in acute distress.    Appearance: Normal appearance. She is not ill-appearing.  HENT:     Head: Normocephalic and atraumatic.     Right Ear: Tympanic membrane, ear canal and external ear normal.     Left Ear: Tympanic membrane, ear canal and external ear normal.  Eyes:     Extraocular Movements: Extraocular movements intact.     Pupils: Pupils are equal, round, and reactive to light.  Cardiovascular:     Rate and Rhythm: Normal rate and regular rhythm.     Heart sounds: Normal heart sounds. No murmur heard.    No gallop.  Pulmonary:     Effort: Pulmonary effort is normal. No respiratory distress.     Breath sounds:  Normal breath sounds. No wheezing or rales.  Abdominal:     General: Bowel sounds are normal. There is no distension.     Palpations: Abdomen is soft.     Tenderness: There is no abdominal tenderness. There is no right CVA tenderness or guarding.  Skin:    General: Skin is warm and dry.  Neurological:     Mental Status: She is alert and oriented to person, place, and time.  Psychiatric:        Judgment: Judgment normal.     BP 132/78 (BP Location: Right Arm, Patient Position: Sitting, Cuff Size: Normal)   Pulse 86   Temp 98 F (36.7 C) (Oral)   Resp 16   Ht 5\' 2"  (1.575 m)   Wt 160 lb (72.6 kg)   LMP  (LMP Unknown)   SpO2 98%   BMI 29.26 kg/m  Wt Readings from Last 3 Encounters:  02/03/22 160 lb (72.6 kg)  12/24/21 155 lb (70.3 kg)  11/26/21 155 lb (70.3 kg)       Assessment & Plan:  Vitamin D deficiency Assessment & Plan: Supplement and monitor    Orders: -     Lipid panel; Future -     VITAMIN D 25 Hydroxy (Vit-D Deficiency, Fractures)  Vitamin B12 deficiency Assessment & Plan: Supplement and monitor  Orders: -     Vitamin B12 -     CBC with Differential/Platelet; Future  Preventative health care Assessment & Plan: Patient encouraged to maintain heart healthy diet, regular exercise, adequate sleep. Consider daily probiotics. Take medications as prescribed. Labs ordered and reviewed. Follows with GYN, [facility], for paps and MGMs. Will request records. [facility] Colonoscopy 12/2021 repeat in 10 years. She declines vaccinations.    High risk medication use -     Drug Monitoring Panel [phone] , Urine  Family history of cancer -     Ambulatory referral to Genetics -     TSH; Future  Thyroid function study abnormality -     T4, free -     T3, free -     Thyroid peroxidase antibody  Hyperglycemia -     Hemoglobin A1c -     Comprehensive metabolic panel; Future  Anemia, unspecified type -     CBC with Differential/Platelet; Future  Mixed hyperlipidemia -     Lipid panel; Future  Primary hypertension -     Naltrexone HCl (Pain); Take 2-3 tablets by mouth daily.  Dispense: 90 capsule; Refill: 3  History of colon polyps Assessment & Plan: Recent colonoscopy clear of polyps but she had a spasm with the bowels catching she responded to Bentyl and it also helped her bladder spasm   Perimenopausal symptoms Assessment & Plan: Palpitations, hot flashes are the most notable. While on topical HRT she felt better but she forgets to apply it bid so is considering patches or oral HRT she will let us know when she decides and we can prescribe   Poor concentration Assessment & Plan: Medications working better again, no changes   Fibromyalgia Assessment & Plan: Naltrexone has been helpful had to increase from 1.5 mg dose to 3 mg dose and is given permission to go to 4.5 mg daily   Other orders -      cloNIDine HCl; Take 2 tablets (0.2 mg total) by mouth 3 (three) times daily.  Dispense: 180 tablet; Refill: 3     I,Alexander Ruley,acting as a scribe for Danise EdgeStacey Blyth, MD.,have documented all  relevant documentation on the behalf of Danise Edge, MD,as directed by  Danise Edge, MD while in the presence of Danise Edge, MD.   I, Danise Edge, MD, personally preformed the services described in this documentation.  All medical record entries made by the scribe were at my direction and in my presence.  I have reviewed the chart and discharge instructions (if applicable) and agree that the record reflects my personal performance and is accurate and complete. 02/03/22   Danise Edge, MD

## 2022-02-04 LAB — THYROID PEROXIDASE ANTIBODY: Thyroperoxidase Ab SerPl-aCnc: 162 IU/mL — ABNORMAL HIGH (ref <9)

## 2022-02-05 ENCOUNTER — Encounter: Payer: Self-pay | Admitting: Family Medicine

## 2022-02-06 ENCOUNTER — Other Ambulatory Visit: Payer: Self-pay | Admitting: Family Medicine

## 2022-02-06 LAB — DRUG MONITORING PANEL 376104, URINE
Amphetamine: 7101 ng/mL — ABNORMAL HIGH (ref <250)
Amphetamines: POSITIVE ng/mL — AB (ref <500)
Barbiturates: NEGATIVE ng/mL (ref <300)
Benzodiazepines: NEGATIVE ng/mL (ref <100)
Cocaine Metabolite: NEGATIVE ng/mL (ref <150)
Desmethyltramadol: NEGATIVE ng/mL (ref <100)
Methamphetamine: NEGATIVE ng/mL (ref <250)
Opiates: NEGATIVE ng/mL (ref <100)
Oxycodone: NEGATIVE ng/mL (ref <100)
Tramadol: NEGATIVE ng/mL (ref <100)

## 2022-02-06 LAB — DM TEMPLATE

## 2022-02-06 MED ORDER — ESTRADIOL 0.025 MG/24HR TD PTWK: 0.0250 mg | MEDICATED_PATCH | TRANSDERMAL | 3 refills | Status: DC

## 2022-02-13 ENCOUNTER — Encounter: Payer: Self-pay | Admitting: Family Medicine

## 2022-02-18 ENCOUNTER — Ambulatory Visit (INDEPENDENT_AMBULATORY_CARE_PROVIDER_SITE_OTHER): Payer: BC Managed Care – PPO | Admitting: Family

## 2022-02-18 VITALS — BP 158/105 | HR 93 | Temp 98.2°F | Resp 16 | Wt 160.0 lb

## 2022-02-18 DIAGNOSIS — H6692 Otitis media, unspecified, left ear: Secondary | ICD-10-CM

## 2022-02-18 DIAGNOSIS — J019 Acute sinusitis, unspecified: Secondary | ICD-10-CM

## 2022-02-18 DIAGNOSIS — J329 Chronic sinusitis, unspecified: Secondary | ICD-10-CM | POA: Insufficient documentation

## 2022-02-18 DIAGNOSIS — H109 Unspecified conjunctivitis: Secondary | ICD-10-CM

## 2022-02-18 DIAGNOSIS — I160 Hypertensive urgency: Secondary | ICD-10-CM

## 2022-02-18 DIAGNOSIS — I10 Essential (primary) hypertension: Secondary | ICD-10-CM

## 2022-02-18 MED ORDER — TOBRAMYCIN-DEXAMETHASONE 0.3-0.1 % OP SUSP: 1.0000 [drp] | OPHTHALMIC | 0 refills | Status: DC

## 2022-02-18 MED ORDER — AMOXICILLIN-POT CLAVULANATE 875-125 MG PO TABS: 1.0000 | ORAL_TABLET | Freq: Two times a day (BID) | ORAL | 0 refills | Status: DC

## 2022-02-18 NOTE — Assessment & Plan Note (Signed)
BP Readings from Last 3 Encounters:  02/18/22 (!) 193/135  02/03/22 132/78  12/24/21 (!) 147/96

## 2022-02-18 NOTE — Progress Notes (Signed)
Subjective:   By signing my name below, I, Kristen [name], attest that this documentation has been prepared under the direction and in the presence of Sandford Craze, NP. 02/18/2022.   Patient ID: Kristen [name], female    DOB: 07-09-1975, 47 y.o.   MRN: [id number]  Chief Complaint  Patient presents with   Ear Pain    Complains of ear pain and pressure    HPI Patient is in today for an office visit.  Left Ear Pain: A week ago Tuesday she woke up with a severe pain in her left ear, associated with erythema, decreased hearing, sinus congestion, and some vertigo. She called [facility] and was prescribed amoxicillin. Her 5-day antibiotic course was completed yesterday. Of note, she states that she has had frequent tympanic ruptures in the past, but her recent pain is noticeably different and feels like "something is pushing to get out of her ear." Previously followed with ENT who retired.  Sinus Pain/Pressure: At this time she is feeling better, but she continues to have decreased hearing and sinus congestion/pressure. She also complains of a left facial numbness that she also attributes to her sinus issues. For 2 weeks she has been using Flonase and decongestants including Aleve cold/sinus and Mucinex with minimal to no relief. She has also tried warm compresses without relief. She is unable to tolerate nasal sinus rinses.  Blood Pressure: In clinic today her blood pressure is elevated to 193/135. On manual recheck her blood pressure improved to 158/105. BP Readings from Last 3 Encounters:  02/19/22 (!) 158/105  02/03/22 132/78  12/24/21 (!) 147/96   She also adds that she has had some itching and discharge in both eyes R>L and has been using tobramycin drops which she had on hand for the last 2 days. She would like to have a refill.   Denies having any fever, new muscle pain, joint pain , new moles, sore throat, chest pain, palpations, cough, SOB ,wheezing,n/v/d constipation, blood  in stool, dysuria, frequency, hematuria, at this time    Past Medical History:  Diagnosis Date   Abnormal cervical cytology 09/26/2011   LGSIL in 2011, patient reports repeat pap was normal, has had intermittent abnormals with bx in past but always normalized, has had cryotherapy with good results Menarche at 17, irregular without birth control at times, very heavy LMP 08/26/2011 No gyn concerns, MGM last year normal    ADD (attention deficit disorder)    Allergy    seasonal   Anemia    hx of none recent per pt on 12-27-2020   Asthma    mild, intermittent   Chicken pox as a child   Chronic kidney disease    Claustrophobia 12/27/2020   Concussion 09/24/2010   DJD (degenerative joint disease)    lower back   Family history of heart disease    Female bladder prolapse, acquired 09/24/2010   Fibromyalgia    Genital herpes 12/27/2020   GERD (gastroesophageal reflux disease)    Headache 09/26/2013   History of concussion 09/24/2010   headaches  x 8 months  and  memeory problems for months no current residual from   History of kidney stones    History of migraines 09/24/2010   History of postpartum depression 2002   HSV-2 infection 09/24/2010   HTN (hypertension) 09/24/2010   Hyperlipidemia    Hypertension    Hypertensive urgency 09/24/2010   hypertension   Hypertriglyceridemia 08/11/2019   Hypothyroidism    Interstitial cystitis 02/18/2011  Left shoulder pain 05/27/2016   Migraine 09/24/2010   Migraine aura, persistent, with cerebral infarct, status over 72 hours 09/24/2010   Myalgia 06/08/2018   Neck pain, musculoskeletal 12/16/2010   oa  and djd no limited neck motion   OSA (obstructive sleep apnea) 08/28/2020   using auto set 6 to 17   Overweight 05/29/2015   Palpitations 11/20/2021   Pharyngitis    sore throat and hurts when yawns since nov t & s surgery is improving   Pneumonia    last time 2012   Poor concentration 12/16/2010   Recurrent UTI    last uti 1st of  december   Self-catheterizes urinary bladder 12/27/2020   prn   Sleep apnea    Stroke (HCC) 2015   Subacute and chronic vaginitis 09/24/2010   TIA (transient ischemic attack) 2015   saw dr berry none since   Tick bite 12/04/2013   rocky mountain spotted fever x 3 months last time 2015   Urinary incontinence    weras pads   Vaginitis and vulvovaginitis 05/21/2014   Wears glasses    or contacts    Past Surgical History:  Procedure Laterality Date   ABDOMINAL SACROCOLPOPEXY N/A 12/31/2020   Procedure: ABDOMINO SACROCOLPOPEXY;  Surgeon: Marguerita Beards, MD;  Location: [facility];  Service: Gynecology;  Laterality: N/A;   ANTERIOR AND POSTERIOR REPAIR  2003   colonscopy  2017   polyps removed due now   CYSTOSCOPY N/A 12/31/2020   Procedure: CYSTOSCOPY;  Surgeon: Marguerita Beards, MD;  Location: [facility];  Service: Gynecology;  Laterality: N/A;   INCONTINENCE SURGERY  01/13/2001   ROBOTIC ASSISTED TOTAL HYSTERECTOMY N/A 12/31/2020   Procedure: XI ROBOTIC ASSISTED TOTAL HYSTERECTOMY with bilateral salpingectomy;  Surgeon: Marguerita Beards, MD;  Location: [facility];  Service: Gynecology;  Laterality: N/A;   TONSILLECTOMY  11/15/2020   and adenoids removed due to constant infections at [facility] of     Family History  Problem Relation Age of Onset   Osteoporosis Mother    Hyperlipidemia Mother    Kidney disease Father    Heart attack Father    Hyperlipidemia Father    Hypertension Father    Alcohol abuse Father    Heart disease Father    Heart attack Brother 4   Hypertension Brother    Heart disease Brother 33       MI   Hyperlipidemia Brother    Crohn's disease Maternal Aunt    Heart disease Maternal Grandmother    Heart failure Maternal Grandmother    Valvular heart disease Maternal Grandmother    Dementia Maternal Grandfather 55       early stages   Stroke Maternal Grandfather    Heart  failure Maternal Grandfather    Heart attack Paternal Grandfather    Stroke Paternal Grandfather    Heart disease Paternal Grandfather    Alcohol abuse Paternal Grandfather    Hyperlipidemia Paternal Grandfather    Hypertension Paternal Grandfather    Colon cancer Neg Hx    Stomach cancer Neg Hx    Esophageal cancer Neg Hx    Pancreatic cancer Neg Hx    Rectal cancer Neg Hx     Social History   Socioeconomic History   Marital status: Married    Spouse name: Not on file   Number of children: Not on file   Years of education: Not on file   Highest education level: Not on file  Occupational History   Not on file  Tobacco Use   Smoking status: Never   Smokeless tobacco: Never  Vaping Use   Vaping Use: Never used  Substance and Sexual Activity   Alcohol use: No    Alcohol/week: 0.0 standard drinks of alcohol    Comment: 0   Drug use: No   Sexual activity: Yes    Partners: Male    Birth control/protection: Pill  Other Topics Concern   Not on file  Social History Narrative   Lives with husband 2 children in a one story home.     Works as a Geophysicist/field seismologist.     Education: college.   Social Determinants of Health   Financial Resource Strain: Not on file  Food Insecurity: Not on file  Transportation Needs: Not on file  Physical Activity: Not on file  Stress: Not on file  Social Connections: Not on file  Intimate Partner Violence: Not on file    Outpatient Medications Prior to Visit  Medication Sig Dispense Refill   acyclovir (ZOVIRAX) 200 MG capsule TAKE 1 CAPSULE BY MOUTH TWICE A DAY INS LIMIT OF 30 DAYS 60 capsule 11   albuterol (VENTOLIN HFA) 108 (90 Base) MCG/ACT inhaler Inhale 2 puffs into the lungs every 6 (six) hours as needed. 1 each 3   ALPRAZolam (XANAX) 0.5 MG tablet 1 tablet by mouth 30 minutes prior to flight. 4 tablet 0   amphetamine-dextroamphetamine (ADDERALL) 20 MG tablet Take 1 tablet (20 mg total) by mouth 2 (two) times daily. July 2023 60 tablet  0   amphetamine-dextroamphetamine (ADDERALL) 20 MG tablet Take 1 tablet (20 mg total) by mouth 2 (two) times daily. December 2023 60 tablet 0   budesonide-formoterol (SYMBICORT) 160-4.5 MCG/ACT inhaler TAKE 2 PUFFS BY MOUTH TWICE A DAY 10.2 each 5   cloNIDine (CATAPRES) 0.1 MG tablet Take 2 tablets (0.2 mg total) by mouth 3 (three) times daily. 180 tablet 3   Cyanocobalamin (VITAMIN B 12 PO) Take by mouth. Vitamin b 12 subligual daily     dicyclomine (BENTYL) 10 MG capsule Take 1 capsule (10 mg total) by mouth every 6 (six) hours as needed (abdominal pain). 90 capsule 0   EPINEPHrine 0.3 mg/0.3 mL IJ SOAJ injection Inject 0.3 mg into the muscle as needed for anaphylaxis. 2 each 2   estradiol (CLIMARA - DOSED IN MG/24 HR) 0.025 mg/24hr patch Place 1 patch (0.025 mg total) onto the skin once a week. 4 patch 3   fluconazole (DIFLUCAN) 150 MG tablet Take 1 tablet (150 mg total) by mouth once a week. Takes prn 2 tablet 2   HYDROcodone-acetaminophen (NORCO/VICODIN) 5-325 MG tablet Take 1 tablet by mouth every 6 (six) hours as needed for moderate pain. 90 tablet 0   hydrOXYzine (ATARAX) 50 MG tablet Take 1 tablet (50 mg total) by mouth every 6 (six) hours as needed for anxiety. 5 tablet 0   lidocaine (XYLOCAINE) 5 % ointment Apply 1 Application topically daily as needed. 50 g 1   liothyronine (CYTOMEL) 5 MCG tablet TAKE 1 TABLET BY MOUTH EVERY DAY 90 tablet 1   MAGNESIUM PO Take by mouth. Otc daily     meloxicam (MOBIC) 7.5 MG tablet TAKE 1 TABLET(7.5 MG) BY MOUTH DAILY 14 tablet 0   Multiple Vitamins-Minerals (MULTIVITAMIN WITH MINERALS) tablet Take 1 tablet by mouth daily.     Naltrexone HCl, Pain, 1.5 MG CAPS Take 2-3 tablets by mouth daily. 90 capsule 3   nebivolol (BYSTOLIC) 5 MG tablet  1 tablet by mouth once daily as needed for systolic blood pressure >160. 30 tablet 3   neomycin-polymyxin b-dexamethasone (MAXITROL) 3.5-10000-0.1 SUSP Place 1 drop into both eyes in the morning, at noon, in the  evening, and at bedtime. 5 mL 1   nitrofurantoin, macrocrystal-monohydrate, (MACROBID) 100 MG capsule TAKE 1 CAPSULE FOR 1 DOSE AS NEEDED FOR PREVENTION OF UTI *AFTER SELF CATH, SWIMMING, HOT TUB, SEX* 30 capsule 8   ondansetron (ZOFRAN) 4 MG tablet Take 1 tablet (4 mg total) by mouth every 8 (eight) hours as needed for nausea or vomiting. 10 tablet 0   SYNTHROID 137 MCG tablet Take 1 tablet (137 mcg total) by mouth daily. 90 tablet 1   No facility-administered medications prior to visit.    Allergies  Allergen Reactions   Chocolate Anaphylaxis    Trouble breathing facial swelling   Peanut-Containing Drug Products Anaphylaxis    All nuts of every kind   Clindamycin/Lincomycin Nausea And Vomiting    And diarrhea.    Influenza Vaccines Rash    Significant swelling in arm, rash, malaise, myalgias    Bystolic [Nebivolol Hcl]     Fatigue arm heaviness   Doxazosin     Would not take due to side effects listed in patient information   Enalapril     Swelling at higher doses fatigue   Hydralazine     Urinary retention and edema    Hydrochlorothiazide     Bladder issues and kidney stones   Losartan     Did not work   Psychologist, occupational [Spironolactone]     Leg cramps   Terazol [Terconazole]     Burning sensation   Latex Rash    Review of Systems  Constitutional:  Negative for fever.  HENT:  Positive for ear pain (Left Ear), hearing loss (Decreased hearing of left ear) and sinus pain. Negative for congestion and sore throat.   Respiratory:  Negative for cough, shortness of breath and wheezing.   Cardiovascular:  Negative for chest pain and palpitations.  Gastrointestinal:  Negative for blood in stool, constipation, diarrhea, nausea and vomiting.  Genitourinary:  Negative for dysuria, frequency and hematuria.  Musculoskeletal:  Negative for joint pain and myalgias.       Objective:    Physical Exam Constitutional:      Appearance: Normal appearance.  HENT:     Head: Normocephalic  and atraumatic.     Right Ear: Tympanic membrane, ear canal and external ear normal.     Left Ear: Ear canal and external ear normal. Decreased hearing noted. Tympanic membrane is not erythematous or bulging.     Ears:     Comments: Left TM dull, no erythema, no bulging.    Nose:     Left Sinus: Maxillary sinus tenderness and frontal sinus tenderness present.     Comments: Left frontal and left maxillary tenderness to palpation. Eyes:     General:        Right eye: No discharge.        Left eye: No discharge.     Extraocular Movements: Extraocular movements intact.     Conjunctiva/sclera: Conjunctivae normal.     Pupils: Pupils are equal, round, and reactive to light.  Cardiovascular:     Rate and Rhythm: Normal rate and regular rhythm.     Heart sounds: Normal heart sounds. No murmur heard.    No gallop.  Pulmonary:     Effort: Pulmonary effort is normal. No respiratory distress.  Breath sounds: Normal breath sounds. No wheezing or rales.  Skin:    General: Skin is warm and dry.  Neurological:     General: No focal deficit present.     Mental Status: She is alert and oriented to person, place, and time.  Psychiatric:        Mood and Affect: Mood normal.        Behavior: Behavior normal.     BP (!) 158/105   Pulse 93   Temp 98.2 F (36.8 C) (Oral)   Resp 16   Wt 160 lb (72.6 kg)   LMP  (LMP Unknown)   SpO2 100%   BMI 29.26 kg/m  Wt Readings from Last 3 Encounters:  02/18/22 160 lb (72.6 kg)  02/03/22 160 lb (72.6 kg)  12/24/21 155 lb (70.3 kg)        Assessment & Plan:   Problem List Items Addressed This Visit       Unprioritized   Left otitis media - Primary    Improved on exam when compared to the TM photos she took of her L TM earlier in her illness. Those photos showed a bulging cherry red TM.  I would like to refer her to ENT for further evaluation due to recurrent nature of her OM.  We discussed that at this point, I don't think she has a bacterial  infection in the left ear but she likely has some increased pressure/fluid which is causing her ear discomfort. I did extend her augmentin, but this is mainly for sinusitis coverage.       Relevant Medications   amoxicillin-clavulanate (AUGMENTIN) 875-125 MG tablet   Other Relevant Orders   Ambulatory referral to ENT   Essential hypertension    BP Readings from Last 3 Encounters:  02/18/22 (!) 193/135  02/03/22 132/78  12/24/21 (!) 147/96  Initial BP was very high at 193/135, repeat bp was a bit better at 158/105. I did advise her to avoid products containing decongestants as this can raise her BP.  I will forward her her cardiologist in the HTN clinic to see if she wishes to make any adjustments ahead of her scheduled follow up visit in April.       Conjunctivitis    Improving. Continue tobradex. Refill sent.       Acute sinusitis    New. Rx with augmentin.       Relevant Medications   amoxicillin-clavulanate (AUGMENTIN) 875-125 MG tablet     Meds ordered this encounter  Medications   amoxicillin-clavulanate (AUGMENTIN) 875-125 MG tablet    Sig: Take 1 tablet by mouth 2 (two) times daily.    Dispense:  14 tablet    Refill:  0    Order Specific Question:   Supervising Provider    Answer:   [name] [[id number]]   tobramycin-dexamethasone (TOBRADEX) ophthalmic solution    Sig: Place 1 drop into both eyes every 4 (four) hours while awake.    Dispense:  5 mL    Refill:  0    Order Specific Question:   Supervising Provider    Answer:   [name] [[id number]]    I, Nance Pear, NP, personally preformed the services described in this documentation.  All medical record entries made by the scribe were at my direction and in my presence.  I have reviewed the chart and discharge instructions (if applicable) and agree that the record reflects my personal performance and is accurate and complete. 02/18/2022.  I,Mathew Stumpf,acting as a Education administrator for [employer],  NP.,have documented all relevant documentation on the behalf of Nance Pear, NP,as directed by  Nance Pear, NP while in the presence of Nance Pear, NP.   Nance Pear, NP

## 2022-02-19 DIAGNOSIS — I10 Essential (primary) hypertension: Secondary | ICD-10-CM | POA: Insufficient documentation

## 2022-02-19 NOTE — Assessment & Plan Note (Signed)
BP Readings from Last 3 Encounters:  02/18/22 (!) 193/135  02/03/22 132/78  12/24/21 (!) 147/96   Initial BP was very high at 193/135, repeat bp was a bit better at 158/105. I did advise her to avoid products containing decongestants as this can raise her BP.  I will forward her her cardiologist in the HTN clinic to see if she wishes to make any adjustments ahead of her scheduled follow up visit in April.

## 2022-02-19 NOTE — Assessment & Plan Note (Signed)
Improved on exam when compared to the TM photos she took of her L TM earlier in her illness. Those photos showed a bulging cherry red TM.  I would like to refer her to ENT for further evaluation due to recurrent nature of her OM.  We discussed that at this point, I don't think she has a bacterial infection in the left ear but she likely has some increased pressure/fluid which is causing her ear discomfort. I did extend her augmentin, but this is mainly for sinusitis coverage.

## 2022-02-19 NOTE — Assessment & Plan Note (Signed)
Improving. Continue tobradex. Refill sent.

## 2022-02-19 NOTE — Assessment & Plan Note (Signed)
New. Rx with augmentin.  

## 2022-03-03 ENCOUNTER — Other Ambulatory Visit: Payer: Self-pay | Admitting: Family Medicine

## 2022-03-03 ENCOUNTER — Encounter: Payer: Self-pay | Admitting: Family Medicine

## 2022-03-03 MED ORDER — AMPHETAMINE-DEXTROAMPHETAMINE 20 MG PO TABS: 20.0000 mg | ORAL_TABLET | Freq: Two times a day (BID) | ORAL | 0 refills | Status: DC

## 2022-03-03 MED ORDER — ESTRADIOL 0.5 MG PO TABS: 0.5000 mg | ORAL_TABLET | Freq: Every day | ORAL | 3 refills | Status: DC

## 2022-03-03 NOTE — Telephone Encounter (Signed)
Requesting: Adderall 90m Contract: Yes 07/29/21 UDS: 02/03/22 Last Visit: 02/03/2022 Next Visit: 05/05/2022 Last Refill: 01/08/22  Please Advise

## 2022-03-05 ENCOUNTER — Other Ambulatory Visit: Payer: Self-pay

## 2022-03-05 ENCOUNTER — Other Ambulatory Visit: Payer: Self-pay | Admitting: Family Medicine

## 2022-03-05 DIAGNOSIS — E538 Deficiency of other specified B group vitamins: Secondary | ICD-10-CM

## 2022-05-04 NOTE — Assessment & Plan Note (Signed)
Well controlled, no changes to meds. Encouraged heart healthy diet such as the DASH diet and exercise as tolerated.  °

## 2022-05-04 NOTE — Assessment & Plan Note (Signed)
Supplement and monitor 

## 2022-05-04 NOTE — Assessment & Plan Note (Signed)
Encourage heart healthy diet such as MIND or DASH diet, increase exercise, avoid trans fats, simple carbohydrates and processed foods, consider a krill or fish or flaxseed oil cap daily.  °

## 2022-05-04 NOTE — Assessment & Plan Note (Addendum)
No recent exacerbation 

## 2022-05-05 ENCOUNTER — Ambulatory Visit (INDEPENDENT_AMBULATORY_CARE_PROVIDER_SITE_OTHER): Payer: BC Managed Care – PPO | Admitting: Family Medicine

## 2022-05-05 VITALS — BP 126/78 | HR 62 | Temp 98.0°F | Resp 16 | Ht 62.0 in | Wt 163.8 lb

## 2022-05-05 DIAGNOSIS — E781 Pure hyperglyceridemia: Secondary | ICD-10-CM

## 2022-05-05 DIAGNOSIS — E538 Deficiency of other specified B group vitamins: Secondary | ICD-10-CM

## 2022-05-05 DIAGNOSIS — E559 Vitamin D deficiency, unspecified: Secondary | ICD-10-CM | POA: Diagnosis not present

## 2022-05-05 DIAGNOSIS — J4520 Mild intermittent asthma, uncomplicated: Secondary | ICD-10-CM

## 2022-05-05 DIAGNOSIS — I10 Essential (primary) hypertension: Secondary | ICD-10-CM

## 2022-05-05 DIAGNOSIS — E039 Hypothyroidism, unspecified: Secondary | ICD-10-CM

## 2022-05-05 DIAGNOSIS — E782 Mixed hyperlipidemia: Secondary | ICD-10-CM

## 2022-05-05 DIAGNOSIS — J329 Chronic sinusitis, unspecified: Secondary | ICD-10-CM

## 2022-05-05 DIAGNOSIS — K5730 Diverticulosis of large intestine without perforation or abscess without bleeding: Secondary | ICD-10-CM

## 2022-05-05 DIAGNOSIS — R6882 Decreased libido: Secondary | ICD-10-CM

## 2022-05-05 DIAGNOSIS — E349 Endocrine disorder, unspecified: Secondary | ICD-10-CM

## 2022-05-05 LAB — COMPREHENSIVE METABOLIC PANEL
ALT: 20 U/L (ref 0–35)
AST: 20 U/L (ref 0–37)
Albumin: 4.1 g/dL (ref 3.5–5.2)
Alkaline Phosphatase: 75 U/L (ref 39–117)
BUN: 15 mg/dL (ref 6–23)
CO2: 29 mEq/L (ref 19–32)
Calcium: 9.6 mg/dL (ref 8.4–10.5)
Chloride: 101 mEq/L (ref 96–112)
Creatinine, Ser: 0.81 mg/dL (ref 0.40–1.20)
GFR: 87.09 mL/min (ref >60.00)
Glucose, Bld: 57 mg/dL — ABNORMAL LOW (ref 70–99)
Potassium: 4 mEq/L (ref 3.5–5.1)
Sodium: 138 mEq/L (ref 135–145)
Total Bilirubin: 0.6 mg/dL (ref 0.2–1.2)
Total Protein: 6.4 g/dL (ref 6.0–8.3)

## 2022-05-05 LAB — CBC WITH DIFFERENTIAL/PLATELET
Basophils Absolute: 0 10*3/uL (ref 0.0–0.1)
Basophils Relative: 0.6 % (ref 0.0–3.0)
Eosinophils Absolute: 0.3 10*3/uL (ref 0.0–0.7)
Eosinophils Relative: 3.7 % (ref 0.0–5.0)
HCT: 43.2 % (ref 36.0–46.0)
Hemoglobin: 14.6 g/dL (ref 12.0–15.0)
Lymphocytes Relative: 33.9 % (ref 12.0–46.0)
Lymphs Abs: 2.6 10*3/uL (ref 0.7–4.0)
MCHC: 33.7 g/dL (ref 30.0–36.0)
MCV: 92.6 fl (ref 78.0–100.0)
Monocytes Absolute: 0.5 10*3/uL (ref 0.1–1.0)
Monocytes Relative: 7.1 % (ref 3.0–12.0)
Neutro Abs: 4.2 10*3/uL (ref 1.4–7.7)
Neutrophils Relative %: 54.7 % (ref 43.0–77.0)
Platelets: 190 10*3/uL (ref 150.0–400.0)
RBC: 4.67 Mil/uL (ref 3.87–5.11)
RDW: 12.4 % (ref 11.5–15.5)
WBC: 7.6 10*3/uL (ref 4.0–10.5)

## 2022-05-05 LAB — VITAMIN D 25 HYDROXY (VIT D DEFICIENCY, FRACTURES): VITD: 46.77 ng/mL (ref 30.00–100.00)

## 2022-05-05 LAB — LDL CHOLESTEROL, DIRECT: Direct LDL: 144 mg/dL

## 2022-05-05 LAB — LIPID PANEL
Cholesterol: 236 mg/dL — ABNORMAL HIGH (ref 0–200)
HDL: 34.4 mg/dL — ABNORMAL LOW (ref >39.00)
Total CHOL/HDL Ratio: 7
Triglycerides: 419 mg/dL — ABNORMAL HIGH (ref 0.0–149.0)

## 2022-05-05 LAB — TSH: TSH: 0.06 u[IU]/mL — ABNORMAL LOW (ref 0.35–5.50)

## 2022-05-05 LAB — VITAMIN B12: Vitamin B-12: 502 pg/mL (ref 211–911)

## 2022-05-05 MED ORDER — METHYLPREDNISOLONE 4 MG PO TBPK: ORAL_TABLET | ORAL | 0 refills | Status: DC

## 2022-05-05 MED ORDER — ESTRADIOL 0.025 MG/24HR TD PTWK: 0.0250 mg | MEDICATED_PATCH | TRANSDERMAL | 3 refills | Status: DC

## 2022-05-05 MED ORDER — CEFDINIR 300 MG PO CAPS: 300.0000 mg | ORAL_CAPSULE | Freq: Two times a day (BID) | ORAL | 0 refills | Status: AC

## 2022-05-05 NOTE — Progress Notes (Signed)
Subjective:   By signing my name below, I, Barrett Shell, attest that this documentation has been prepared under the direction and in the presence of Bradd Canary, MD. 05/05/2022   Patient ID: Kristen [name], female    DOB: 01-25-1975, 47 y.o.   MRN: [id number]  No chief complaint on file.   HPI Patient is in today for a follow-up appointment.   Sinus Pain She complains of sinus pain that predominates in her left ear. She feels the pain in her eyes and neck as a result. She also has bloody noses on both sides of her nose and tightness in her chest. She denies SOB or coughing. She is using aspirin as needed to manage it. She has a referral to ENT, but is still waiting for an appointment. She previously took Augmentin to help manage the sinus pain.  Neuropathy She complains of increased neuropathy in her hands and feet. She also reports increased fatigue and lightheadedness. The lightheadedness occurs when her blood pressure is closer to normal. She recently had her dosage on clonidine increased and thinks the neuropathy may be occurring as a result.   Estradiol She is no longer taking 0.5 mg estradiol. She is currently using the patch.  Bowel movements She is having increased loose stools since her colonoscopy. She is taking digestive enzymes and a probiotic to relieve it.   Past Medical History:  Diagnosis Date   Abnormal cervical cytology 09/26/2011   LGSIL in 2011, patient reports repeat pap was normal, has had intermittent abnormals with bx in past but always normalized, has had cryotherapy with good results Menarche at 17, irregular without birth control at times, very heavy LMP 08/26/2011 No gyn concerns, MGM last year normal    ADD (attention deficit disorder)    Allergy    seasonal   Anemia    hx of none recent per pt on 12-27-2020   Asthma    mild, intermittent   Chicken pox as a child   Chronic kidney disease    Claustrophobia 12/27/2020   Concussion 09/24/2010   DJD  (degenerative joint disease)    lower back   Family history of heart disease    Female bladder prolapse, acquired 09/24/2010   Fibromyalgia    Genital herpes 12/27/2020   GERD (gastroesophageal reflux disease)    Headache 09/26/2013   History of concussion 09/24/2010   headaches  x 8 months  and  memeory problems for months no current residual from   History of kidney stones    History of migraines 09/24/2010   History of postpartum depression 2002   HSV-2 infection 09/24/2010   HTN (hypertension) 09/24/2010   Hyperlipidemia    Hypertension    Hypertensive urgency 09/24/2010   hypertension   Hypertriglyceridemia 08/11/2019   Hypothyroidism    Interstitial cystitis 02/18/2011   Left shoulder pain 05/27/2016   Migraine 09/24/2010   Migraine aura, persistent, with cerebral infarct, status over 72 hours 09/24/2010   Myalgia 06/08/2018   Neck pain, musculoskeletal 12/16/2010   oa  and djd no limited neck motion   OSA (obstructive sleep apnea) 08/28/2020   using auto set 6 to 17   Overweight 05/29/2015   Palpitations 11/20/2021   Pharyngitis    sore throat and hurts when yawns since nov t & s surgery is improving   Pneumonia    last time 2012   Poor concentration 12/16/2010   Recurrent UTI    last uti 1st of december  Self-catheterizes urinary bladder 12/27/2020   prn   Sleep apnea    Stroke 2015   Subacute and chronic vaginitis 09/24/2010   TIA (transient ischemic attack) 2015   saw dr berry none since   Tick bite 12/04/2013   rocky mountain spotted fever x 3 months last time 2015   Urinary incontinence    weras pads   Vaginitis and vulvovaginitis 05/21/2014   Wears glasses    or contacts    Past Surgical History:  Procedure Laterality Date   ABDOMINAL SACROCOLPOPEXY N/A 12/31/2020   Procedure: ABDOMINO SACROCOLPOPEXY;  Surgeon: Marguerita Beards, MD;  Location: [facility];  Service: Gynecology;  Laterality: N/A;   ANTERIOR AND POSTERIOR  REPAIR  2003   colonscopy  2017   polyps removed due now   CYSTOSCOPY N/A 12/31/2020   Procedure: CYSTOSCOPY;  Surgeon: Marguerita Beards, MD;  [facility];  Service: Gynecology;  Laterality: N/A;   INCONTINENCE SURGERY  01/13/2001   ROBOTIC ASSISTED TOTAL HYSTERECTOMY N/A 12/31/2020   Procedure: XI ROBOTIC ASSISTED TOTAL HYSTERECTOMY with bilateral salpingectomy;  Surgeon: Marguerita Beards, MD;  Location: [facility];  Service: Gynecology;  Laterality: N/A;   TONSILLECTOMY  11/15/2020   and adenoids removed due to constant infections at [facility] [place]    Family History  Problem Relation Age of Onset   Osteoporosis Mother    Hyperlipidemia Mother    Kidney disease Father    Heart attack Father    Hyperlipidemia Father    Hypertension Father    Alcohol abuse Father    Heart disease Father    Heart attack Brother 80   Hypertension Brother    Heart disease Brother 78       MI   Hyperlipidemia Brother    Crohn's disease Maternal Aunt    Heart disease Maternal Grandmother    Heart failure Maternal Grandmother    Valvular heart disease Maternal Grandmother    Dementia Maternal Grandfather 55       early stages   Stroke Maternal Grandfather    Heart failure Maternal Grandfather    Heart attack Paternal Grandfather    Stroke Paternal Grandfather    Heart disease Paternal Grandfather    Alcohol abuse Paternal Grandfather    Hyperlipidemia Paternal Grandfather    Hypertension Paternal Grandfather    Colon cancer Neg Hx    Stomach cancer Neg Hx    Esophageal cancer Neg Hx    Pancreatic cancer Neg Hx    Rectal cancer Neg Hx     Social History   Socioeconomic History   Marital status: Married    Spouse name: Not on file   Number of children: Not on file   Years of education: Not on file   Highest education level: Associate degree: occupational, Scientist, product/process development, or vocational program  Occupational History   Not on  file  Tobacco Use   Smoking status: Never   Smokeless tobacco: Never  Vaping Use   Vaping Use: Never used  Substance and Sexual Activity   Alcohol use: No    Alcohol/week: 0.0 standard drinks of alcohol    Comment: 0   Drug use: No   Sexual activity: Yes    Partners: Male    Birth control/protection: Pill  Other Topics Concern   Not on file  Social History Narrative   Lives with husband 2 children in a one story home.     Works as a Teacher, adult education.  Education: college.   Social Determinants of Health   Financial Resource Strain: High Risk (05/05/2022)   Overall Financial Resource Strain (CARDIA)    Difficulty of Paying Living Expenses: Hard  Food Insecurity: No Food Insecurity (05/05/2022)   Hunger Vital Sign    Worried About Running Out of Food in the Last Year: Never true    Ran Out of Food in the Last Year: Never true  Transportation Needs: No Transportation Needs (05/05/2022)   PRAPARE - Administrator, Civil Service (Medical): No    Lack of Transportation (Non-Medical): No  Physical Activity: Sufficiently Active (05/05/2022)   Exercise Vital Sign    Days of Exercise per Week: 5 days    Minutes of Exercise per Session: 40 min  Stress: Stress Concern Present (05/05/2022)   [employer] of Occupational Health - Occupational Stress Questionnaire    Feeling of Stress : Very much  Social Connections: Moderately Isolated (05/05/2022)   Social Connection and Isolation Panel [NHANES]    Frequency of Communication with Friends and Family: Patient declined    Frequency of Social Gatherings with Friends and Family: [name] than three times a week    Attends Religious Services: Never    Database administrator or Organizations: No    Attends Engineer, structural: Not on file    Marital Status: Married  Catering manager Violence: Not on file    Outpatient Medications Prior to Visit  Medication Sig Dispense Refill   acyclovir (ZOVIRAX) 200 MG capsule  TAKE 1 CAPSULE BY MOUTH TWICE A DAY INS LIMIT OF 30 DAYS 60 capsule 11   albuterol (VENTOLIN HFA) 108 (90 Base) MCG/ACT inhaler Inhale 2 puffs into the lungs every 6 (six) hours as needed. 1 each 3   ALPRAZolam (XANAX) 0.5 MG tablet 1 tablet by mouth 30 minutes prior to flight. 4 tablet 0   amoxicillin-clavulanate (AUGMENTIN) 875-125 MG tablet Take 1 tablet by mouth 2 (two) times daily. 14 tablet 0   amphetamine-dextroamphetamine (ADDERALL) 20 MG tablet Take 1 tablet (20 mg total) by mouth 2 (two) times daily. July 2023 60 tablet 0   amphetamine-dextroamphetamine (ADDERALL) 20 MG tablet Take 1 tablet (20 mg total) by mouth 2 (two) times daily. December 2023 60 tablet 0   budesonide-formoterol (SYMBICORT) 160-4.5 MCG/ACT inhaler TAKE 2 PUFFS BY MOUTH TWICE A DAY 10.2 each 5   cloNIDine (CATAPRES) 0.1 MG tablet Take 2 tablets (0.2 mg total) by mouth 3 (three) times daily. 180 tablet 3   Cyanocobalamin (VITAMIN B 12 PO) Take by mouth. Vitamin b 12 subligual daily     dicyclomine (BENTYL) 10 MG capsule Take 1 capsule (10 mg total) by mouth every 6 (six) hours as needed (abdominal pain). 90 capsule 0   EPINEPHrine 0.3 mg/0.3 mL IJ SOAJ injection Inject 0.3 mg into the muscle as needed for anaphylaxis. 2 each 2   estradiol (ESTRACE) 0.5 MG tablet Take 1 tablet (0.5 mg total) by mouth daily. 30 tablet 3   fluconazole (DIFLUCAN) 150 MG tablet Take 1 tablet (150 mg total) by mouth once a week. Takes prn 2 tablet 2   HYDROcodone-acetaminophen (NORCO/VICODIN) 5-325 MG tablet Take 1 tablet by mouth every 6 (six) hours as needed for moderate pain. 90 tablet 0   hydrOXYzine (ATARAX) 50 MG tablet Take 1 tablet (50 mg total) by mouth every 6 (six) hours as needed for anxiety. 5 tablet 0   lidocaine (XYLOCAINE) 5 % ointment Apply 1 Application topically daily as needed.  50 g 1   liothyronine (CYTOMEL) 5 MCG tablet TAKE 1 TABLET BY MOUTH EVERY DAY 90 tablet 1   MAGNESIUM PO Take by mouth. Otc daily     meloxicam  (MOBIC) 7.5 MG tablet TAKE 1 TABLET(7.5 MG) BY MOUTH DAILY 14 tablet 0   Multiple Vitamins-Minerals (MULTIVITAMIN WITH MINERALS) tablet Take 1 tablet by mouth daily.     Naltrexone HCl, Pain, 1.5 MG CAPS Take 2-3 tablets by mouth daily. 90 capsule 3   nebivolol (BYSTOLIC) 5 MG tablet 1 tablet by mouth once daily as needed for systolic blood pressure >160. 30 tablet 3   neomycin-polymyxin b-dexamethasone (MAXITROL) 3.5-10000-0.1 SUSP Place 1 drop into both eyes in the morning, at noon, in the evening, and at bedtime. 5 mL 1   nitrofurantoin, macrocrystal-monohydrate, (MACROBID) 100 MG capsule TAKE 1 CAPSULE FOR 1 DOSE AS NEEDED FOR PREVENTION OF UTI *AFTER SELF CATH, SWIMMING, HOT TUB, SEX* 30 capsule 8   ondansetron (ZOFRAN) 4 MG tablet Take 1 tablet (4 mg total) by mouth every 8 (eight) hours as needed for nausea or vomiting. 10 tablet 0   SYNTHROID 137 MCG tablet TAKE 1 TABLET(137 MCG) BY MOUTH DAILY 90 tablet 1   tobramycin-dexamethasone (TOBRADEX) ophthalmic solution Place 1 drop into both eyes every 4 (four) hours while awake. 5 mL 0   No facility-administered medications prior to visit.    Allergies  Allergen Reactions   Chocolate Anaphylaxis    Trouble breathing facial swelling   Peanut-Containing Drug Products Anaphylaxis    All nuts of every kind   Clindamycin/Lincomycin Nausea And Vomiting    And diarrhea.    Influenza Vaccines Rash    Significant swelling in arm, rash, malaise, myalgias    Bystolic [Nebivolol Hcl]     Fatigue arm heaviness   Doxazosin     Would not take due to side effects listed in patient information   Enalapril     Swelling at higher doses fatigue   Hydralazine     Urinary retention and edema    Hydrochlorothiazide     Bladder issues and kidney stones   Losartan     Did not work   Psychologist, occupational [Spironolactone]     Leg cramps   Terazol [Terconazole]     Burning sensation   Latex Rash    ROS     Objective:    Physical Exam Constitutional:       General: She is not in acute distress.    Appearance: Normal appearance.  HENT:     Head: Normocephalic and atraumatic.     Right Ear: External ear normal.     Left Ear: External ear normal.  Eyes:     Extraocular Movements: Extraocular movements intact.     Pupils: Pupils are equal, round, and reactive to light.  Cardiovascular:     Rate and Rhythm: Normal rate and regular rhythm.     Heart sounds: Normal heart sounds. No murmur heard.    No gallop.  Pulmonary:     Effort: Pulmonary effort is normal. No respiratory distress.     Breath sounds: Normal breath sounds. No wheezing or rales.  Skin:    General: Skin is warm.  Neurological:     Mental Status: She is alert and oriented to person, place, and time.  Psychiatric:        Judgment: Judgment normal.     LMP  (LMP Unknown)  Wt Readings from Last 3 Encounters:  02/18/22 160 lb (72.6 kg)  02/03/22 160 lb (72.6 kg)  12/24/21 155 lb (70.3 kg)       Assessment & Plan:  Essential hypertension Assessment & Plan: Well controlled, no changes to meds. Encouraged heart healthy diet such as the DASH diet and exercise as tolerated.     Mixed hyperlipidemia Assessment & Plan: Encourage heart healthy diet such as MIND or DASH diet, increase exercise, avoid trans fats, simple carbohydrates and processed foods, consider a krill or fish or flaxseed oil cap daily.    Mild intermittent asthma without complication Assessment & Plan: No recent exacerbation   Vitamin B12 deficiency Assessment & Plan: Supplement and monitor    Vitamin D deficiency Assessment & Plan: Supplement and monitor      I, Barrett Shell, personally preformed the services described in this documentation.  All medical record entries made by the scribe were at my direction and in my presence.  I have reviewed the chart and discharge instructions (if applicable) and agree that the record reflects my personal performance and is accurate and complete.  05/05/2022  Barrett Shell   Mercer Pod as a scribe for Danise Edge, MD.,have documented all relevant documentation on the behalf of Danise Edge, MD,as directed by  Danise Edge, MD while in the presence of Danise Edge, MD.

## 2022-05-06 ENCOUNTER — Other Ambulatory Visit: Payer: Self-pay | Admitting: Family Medicine

## 2022-05-06 ENCOUNTER — Telehealth: Payer: Self-pay

## 2022-05-06 ENCOUNTER — Ambulatory Visit (INDEPENDENT_AMBULATORY_CARE_PROVIDER_SITE_OTHER): Payer: BC Managed Care – PPO

## 2022-05-06 ENCOUNTER — Encounter: Payer: Self-pay | Admitting: Family Medicine

## 2022-05-06 DIAGNOSIS — E039 Hypothyroidism, unspecified: Secondary | ICD-10-CM

## 2022-05-06 LAB — T4, FREE: Free T4: 1.29 ng/dL (ref 0.60–1.60)

## 2022-05-06 MED ORDER — AMPHETAMINE-DEXTROAMPHETAMINE 20 MG PO TABS: 20.0000 mg | ORAL_TABLET | Freq: Two times a day (BID) | ORAL | 0 refills | Status: DC

## 2022-05-06 NOTE — Assessment & Plan Note (Signed)
She did not tolerate the oral estrogen so she would like to switch back to the patch which she did better on. Refill given. She is considering Testosterone replacement elsewhere and will keep us posted.  

## 2022-05-06 NOTE — Assessment & Plan Note (Signed)
Notes increased GI upset and loose stool since colonoscopy, encouraged to consider Pendulum probiotics and an oat based fiber supplement as she did not tolerate Psyllium in the past

## 2022-05-06 NOTE — Telephone Encounter (Signed)
Called left message informing patient that test she requested has been added from yesterday's visit.

## 2022-05-06 NOTE — Assessment & Plan Note (Signed)
Well controlled today but she notes with the Clonidine and Bystolic and better BP control she is having similar symptoms to the ones she had when she uptitrated her Coreg. Fatigue, tingling in hands and feet, lightheaded sensations. She will discuss with cardiology at upcoming visit

## 2022-05-06 NOTE — Assessment & Plan Note (Signed)
Up again, continue to minimize processed foods and simple carbohydrates, stay active and monitor

## 2022-05-06 NOTE — Assessment & Plan Note (Deleted)
She did not tolerate the oral estrogen so she would like to switch back to the patch which she did better on. Refill given. She is considering Testosterone replacement elsewhere and will keep [place] posted.

## 2022-05-06 NOTE — Telephone Encounter (Signed)
Requesting: Adderall  Contract: Yes UDS: 02/03/22 Last Visit: 05/05/2022 Next Visit: Visit date not found Last Refill: 03/03/22  Please Advise

## 2022-05-06 NOTE — Assessment & Plan Note (Signed)
Started on Cefdinir bid and consider Mucinex BID with increased rest and fluids

## 2022-05-06 NOTE — Assessment & Plan Note (Signed)
On Levothyroxine and Cytomel, continue to monitor TSH suppressed but free T4 normal. Await TPA results

## 2022-05-07 LAB — THYROID PEROXIDASE ANTIBODY: Thyroperoxidase Ab SerPl-aCnc: 145 IU/mL — ABNORMAL HIGH (ref <9)

## 2022-05-12 ENCOUNTER — Encounter (HOSPITAL_BASED_OUTPATIENT_CLINIC_OR_DEPARTMENT_OTHER): Payer: Self-pay | Admitting: Cardiovascular Disease

## 2022-05-12 ENCOUNTER — Ambulatory Visit (INDEPENDENT_AMBULATORY_CARE_PROVIDER_SITE_OTHER): Payer: BC Managed Care – PPO | Admitting: Cardiovascular Disease

## 2022-05-12 VITALS — BP 160/104 | HR 106 | Ht 62.0 in | Wt 163.6 lb

## 2022-05-12 DIAGNOSIS — Z01812 Encounter for preprocedural laboratory examination: Secondary | ICD-10-CM | POA: Diagnosis not present

## 2022-05-12 DIAGNOSIS — I10 Essential (primary) hypertension: Secondary | ICD-10-CM | POA: Diagnosis not present

## 2022-05-12 NOTE — Progress Notes (Signed)
Advanced Hypertension Clinic Follow up Assessment:    Date:  05/12/2022   ID:  Kristen [name], DOB Nov 15, 1975, MRN [id number]  PCP:  Kristen Canary, MD  Cardiologist:  None  Nephrologist:  Referring MD: Kristen Canary, MD   CC: Hypertension  History of Present Illness:    Kristen [name] is a 47 y.o. female with a hx of TIA, hypertension and hyperlipidemia here for follow-up. She initially established care in the hypertension clinic 08/11/2019. She struggled with low BP for years.  Then her BP spiked 09/2013.  She had a TIA and saw Dr. Allyson [name].  She continued to have intermittent spiking of her BP.  She struggled with several medications and finally went on Bystolic.  Her BP was well-controlled but she gained weight and had heaviness in her L arm.  She stopped it and felt much better. Her BP started to be higher after a stomach bug and tick bite.  She was treated with enalapril and the dose was increased.  Initially it worked well but stopped being as effective.  She had to start back on Bystolic but is gaining weight, fatigued, and has heaviness in her chest.  Her blood pressure is controlled but the symptoms are manageable.  She has not had any orthopnea or PND but does sometimes have edema that she attributes to enalapril.  Overall her diet has been good. Her mom had heart disease at a young age so she is used to eating a healthy diet.  Lean meat and limited salt intake.  She cooks at home.  She doesn't drink caffeine or EtOH.  She takes magnesium, B12 injections, vitamin ADEK.  She has been taking Adderall since 8th grade.  She stopped taking it for a few days and her BP was still high.  She has a history of hypertriglyceridemia since she was in middle school.  She has taken fish oil without much change in her numbers.  She is also taking Krill oil.  What helped most was drinking warm water with lemon.  She is averse to trying statins because many family members have had muscle aches.  Of note, her father  had a heart attack in his 63s.  Her brother had 2 heart attacks in his 59s.  Her mother has heart rhythm disorders.  At her initial appointment she was started on amlodipine. She stopped enalapril and Bystolic. Since then she saw our pharmacist and increased spironolactone 12/2019.  She was referred to the prep program through the [facility]. Her blood pressure was still elevated so she was started on hydralazine. She took one dose and it caused bladder issues, so she was switched to doxasozin. She also stopped spironolactone due to leg cramps. She saw our pharmacist on 02/2020 and switched from enalapril to olmesartan. She would not start carvedilol because it caused fatigue for her mother. She stopped olmesartan before follow-up with our pharmacist.  She was started on a low dose of carvedilol. She was also encouraged to follow-up with the sleep team and start a CPA and saw improvement in her BP.  At the last visit her blood pressure remained uncontrolled.  We discussed trying clonidine but she was hesitant.  She started clonidine 07/2021 with her PCP.  At her follow-up visit 10/2021 she reported some intermittent chest pain at night that was sharp.     Blood pressure has been well-controlled with clonidine since July, taking a full tablet in the morning, half a tablet around three, and  a full tablet in the evening. She mentions occasional spikes in blood pressure but overall feels that this is the most controlled it has been for an extended period.  She experienced palpitations approximately a month and a half ago, initially suspecting anxiety attacks. She started using a natural cream for estrogen, which has stopped the palpitations completely. The patient has a history of Hashimoto's thyroiditis, with a recent thyroid panel showing a normal TSH but an abnormal T4. She has had an ultrasound for her thyroid and has a renal ultrasound scheduled due to a long-standing cyst on her kidney. She has previously been  tested for pheochromocytoma with catecholamines and metanephrines lab tests, which were negative.  Ms. Yard reports that her hemoglobin and hematocrit levels have been slightly elevated, but she has been diligent with her water intake, ruling out dehydration as a cause. She has noticed that her blood pressure feels better when taking aspirin and wonders if there is too much thickening happening in her blood.  At her visit 11/2021 her blood pressure was much more elevated than usual.  In general at home it was well-controlled.  Clonidine was increased to 0.1 mg in the afternoon.  She had nebivolol to take as needed.  Renal artery Dopplers 11/2021 revealed mild stenosis bilaterally.   Previous antihypertensives: HCTZ - AKI, bladder issues, and kidney stones Losartan-didn't work Bystolic- fatigue, arm heaviness Enalapril- swelling at higher dose, fatigue. Amlodipine - urine retention, edema, leg tighten, elevated HR and BP Hydralazine - urinary retention Spironolactone - muscle cramping Doxazosin - would not take due to side effects listed in PI (weight gain, fatigue)    Past Medical History:  Diagnosis Date   Abnormal cervical cytology 09/26/2011   LGSIL in 2011, patient reports repeat pap was normal, has had intermittent abnormals with bx in past but always normalized, has had cryotherapy with good results Menarche at 17, irregular without birth control at times, very heavy LMP 08/26/2011 No gyn concerns, MGM last year normal    ADD (attention deficit disorder)    Allergy    seasonal   Anemia    hx of none recent per pt on 12-27-2020   Asthma    mild, intermittent   Chicken pox as a child   Chronic kidney disease    Claustrophobia 12/27/2020   Concussion 09/24/2010   DJD (degenerative joint disease)    lower back   Family history of heart disease    Female bladder prolapse, acquired 09/24/2010   Fibromyalgia    Genital herpes 12/27/2020   GERD (gastroesophageal reflux disease)     Headache 09/26/2013   History of concussion 09/24/2010   headaches  x 8 months  and  memeory problems for months no current residual from   History of kidney stones    History of migraines 09/24/2010   History of postpartum depression 2002   HSV-2 infection 09/24/2010   HTN (hypertension) 09/24/2010   Hyperlipidemia    Hypertension    Hypertensive urgency 09/24/2010   hypertension   Hypertriglyceridemia 08/11/2019   Hypothyroidism    Interstitial cystitis 02/18/2011   Left shoulder pain 05/27/2016   Migraine 09/24/2010   Migraine aura, persistent, with cerebral infarct, status over 72 hours 09/24/2010   Myalgia 06/08/2018   Neck pain, musculoskeletal 12/16/2010   oa  and djd no limited neck motion   OSA (obstructive sleep apnea) 08/28/2020   using auto set 6 to 17   Overweight 05/29/2015   Palpitations 11/20/2021   Pharyngitis  sore throat and hurts when yawns since nov t & s surgery is improving   Pneumonia    last time 2012   Poor concentration 12/16/2010   Recurrent UTI    last uti 1st of december   Self-catheterizes urinary bladder 12/27/2020   prn   Sleep apnea    Stroke (HCC) 2015   Subacute and chronic vaginitis 09/24/2010   TIA (transient ischemic attack) 2015   saw dr berry none since   Tick bite 12/04/2013   rocky mountain spotted fever x 3 months last time 2015   Urinary incontinence    weras pads   Vaginitis and vulvovaginitis 05/21/2014   Wears glasses    or contacts    Past Surgical History:  Procedure Laterality Date   ABDOMINAL SACROCOLPOPEXY N/A 12/31/2020   Procedure: ABDOMINO SACROCOLPOPEXY;  Surgeon: Marguerita Beards, MD;  Location: [facility];  Service: Gynecology;  Laterality: N/A;   ANTERIOR AND POSTERIOR REPAIR  2003   colonscopy  2017   polyps removed due now   CYSTOSCOPY N/A 12/31/2020   Procedure: CYSTOSCOPY;  Surgeon: Marguerita Beards, MD;  Location: [facility];  Service: Gynecology;   Laterality: N/A;   INCONTINENCE SURGERY  01/13/2001   ROBOTIC ASSISTED TOTAL HYSTERECTOMY N/A 12/31/2020   Procedure: XI ROBOTIC ASSISTED TOTAL HYSTERECTOMY with bilateral salpingectomy;  Surgeon: Marguerita Beards, MD;  Location: [facility];  Service: Gynecology;  Laterality: N/A;   TONSILLECTOMY  11/15/2020   and adenoids removed due to constant infections at [facility] [place]    Current Medications: Current Meds  Medication Sig   acyclovir (ZOVIRAX) 200 MG capsule TAKE 1 CAPSULE BY MOUTH TWICE A DAY INS LIMIT OF 30 DAYS   albuterol (VENTOLIN HFA) 108 (90 Base) MCG/ACT inhaler Inhale 2 puffs into the lungs every 6 (six) hours as needed.   ALPRAZolam (XANAX) 0.5 MG tablet 1 tablet by mouth 30 minutes prior to flight.   amphetamine-dextroamphetamine (ADDERALL) 20 MG tablet Take 1 tablet (20 mg total) by mouth 2 (two) times daily. July 2023   amphetamine-dextroamphetamine (ADDERALL) 20 MG tablet Take 1 tablet (20 mg total) by mouth 2 (two) times daily. April 2024   budesonide-formoterol (SYMBICORT) 160-4.5 MCG/ACT inhaler TAKE 2 PUFFS BY MOUTH TWICE A DAY   cefdinir (OMNICEF) 300 MG capsule Take 1 capsule (300 mg total) by mouth 2 (two) times daily for 10 days.   cloNIDine (CATAPRES) 0.1 MG tablet Take 2 tablets (0.2 mg total) by mouth 3 (three) times daily.   Cyanocobalamin (VITAMIN B 12 PO) Take by mouth. Vitamin b 12 subligual daily   dicyclomine (BENTYL) 10 MG capsule Take 1 capsule (10 mg total) by mouth every 6 (six) hours as needed (abdominal pain).   EPINEPHrine 0.3 mg/0.3 mL IJ SOAJ injection Inject 0.3 mg into the muscle as needed for anaphylaxis.   estradiol (CLIMARA - DOSED IN MG/24 HR) 0.025 mg/24hr patch Place 1 patch (0.025 mg total) onto the skin every 3 (three) days.   fluconazole (DIFLUCAN) 150 MG tablet Take 1 tablet (150 mg total) by mouth once a week. Takes prn   HYDROcodone-acetaminophen (NORCO/VICODIN) 5-325 MG tablet Take 1 tablet by mouth  every 6 (six) hours as needed for moderate pain.   hydrOXYzine (ATARAX) 50 MG tablet Take 1 tablet (50 mg total) by mouth every 6 (six) hours as needed for anxiety.   lidocaine (XYLOCAINE) 5 % ointment Apply 1 Application topically daily as needed.   liothyronine (CYTOMEL)  5 MCG tablet TAKE 1 TABLET BY MOUTH EVERY DAY   MAGNESIUM PO Take by mouth. Otc daily   meloxicam (MOBIC) 7.5 MG tablet TAKE 1 TABLET(7.5 MG) BY MOUTH DAILY   methylPREDNISolone (MEDROL DOSEPAK) 4 MG TBPK tablet 5 tabs po x 1 day then 4 tabs po x 1 day then 3 tabs po x 1 day then 2 tabs po x 1 day then 1 tab po x 1 day and stop   Multiple Vitamins-Minerals (MULTIVITAMIN WITH MINERALS) tablet Take 1 tablet by mouth daily.   Naltrexone HCl, Pain, 1.5 MG CAPS Take 2-3 tablets by mouth daily.   nebivolol (BYSTOLIC) 5 MG tablet 1 tablet by mouth once daily as needed for systolic blood pressure >160.   neomycin-polymyxin b-dexamethasone (MAXITROL) 3.5-10000-0.1 SUSP Place 1 drop into both eyes in the morning, at noon, in the evening, and at bedtime.   nitrofurantoin, macrocrystal-monohydrate, (MACROBID) 100 MG capsule TAKE 1 CAPSULE FOR 1 DOSE AS NEEDED FOR PREVENTION OF UTI *AFTER SELF CATH, SWIMMING, HOT TUB, SEX*   ondansetron (ZOFRAN) 4 MG tablet Take 1 tablet (4 mg total) by mouth every 8 (eight) hours as needed for nausea or vomiting.   SYNTHROID 137 MCG tablet TAKE 1 TABLET(137 MCG) BY MOUTH DAILY   tobramycin-dexamethasone (TOBRADEX) ophthalmic solution Place 1 drop into both eyes every 4 (four) hours while awake.     Allergies:   Chocolate, Peanut-containing drug products, Clindamycin/lincomycin, Estradiol, Influenza vaccines, Bystolic [nebivolol hcl], Doxazosin, Enalapril, Hydralazine, Hydrochlorothiazide, Losartan, Spiractone [spironolactone], Terazol [terconazole], and Latex   Social History   Socioeconomic History   Marital status: Married    Spouse name: Not on file   Number of children: Not on file   Years of  education: Not on file   Highest education level: Associate degree: occupational, Scientist, product/process development, or vocational program  Occupational History   Not on file  Tobacco Use   Smoking status: Never   Smokeless tobacco: Never  Vaping Use   Vaping Use: Never used  Substance and Sexual Activity   Alcohol use: No    Alcohol/week: 0.0 standard drinks of alcohol    Comment: 0   Drug use: No   Sexual activity: Yes    Partners: Male    Birth control/protection: Pill  Other Topics Concern   Not on file  Social History Narrative   Lives with husband 2 children in a one story home.     Works as a Teacher, adult education.     Education: college.   Social Determinants of Health   Financial Resource Strain: High Risk (05/05/2022)   Overall Financial Resource Strain (CARDIA)    Difficulty of Paying Living Expenses: Hard  Food Insecurity: No Food Insecurity (05/05/2022)   Hunger Vital Sign    Worried About Running Out of Food in the Last Year: Never true    Ran Out of Food in the Last Year: Never true  Transportation Needs: No Transportation Needs (05/05/2022)   PRAPARE - Administrator, Civil Service (Medical): No    Lack of Transportation (Non-Medical): No  Physical Activity: Sufficiently Active (05/05/2022)   Exercise Vital Sign    Days of Exercise per Week: 5 days    Minutes of Exercise per Session: 40 min  Stress: Stress Concern Present (05/05/2022)   [employer] of Occupational Health - Occupational Stress Questionnaire    Feeling of Stress : Very much  Social Connections: Moderately Isolated (05/05/2022)   Social Connection and Isolation Panel [NHANES]    Frequency  of Communication with Friends and Family: Patient declined    Frequency of Social Gatherings with Friends and Family: More than three times a week    Attends Religious Services: Never    Database administrator or Organizations: No    Attends Engineer, structural: Not on file    Marital Status: Married      Family History: The patient's family history includes Alcohol abuse in her father and paternal grandfather; Crohn's disease in her maternal aunt; Dementia (age of onset: 17) in her maternal grandfather; Heart attack in her father and paternal grandfather; Heart attack (age of onset: 33) in her brother; Heart disease in her father, maternal grandmother, and paternal grandfather; Heart disease (age of onset: 22) in her brother; Heart failure in her maternal grandfather and maternal grandmother; Hyperlipidemia in her brother, father, mother, and paternal grandfather; Hypertension in her brother, father, and paternal grandfather; Kidney disease in her father; Osteoporosis in her mother; Stroke in her maternal grandfather and paternal grandfather; Valvular heart disease in her maternal grandmother. There is no history of Colon cancer, Stomach cancer, Esophageal cancer, Pancreatic cancer, or Rectal cancer.  ROS:   Please see the history of present illness.    (+) Asthma (+) Swelling All other systems reviewed and are negative.  EKGs/Labs/Other Studies Reviewed:    Coronary Calcium Score 08/30/2019: FINDINGS: Non-cardiac: See separate report from [facility]. Ascending Aorta: Pericardium: Normal Coronary arteries: No coronary calcification. IMPRESSION: Coronary calcium score of 0. This was 0 percentile for age and sex matched control.  EKG:  EKG was not ordered today 08/28/2020: Sinus rhythm. Rate 96 bpm. 08/11/2019: sinus rhythm.  Rate 88 bpm.  Recent Labs: 05/05/2022: ALT 20; BUN 15; Creatinine, Ser 0.81; Hemoglobin 14.6; Platelets 190.0; Potassium 4.0; Sodium 138; TSH 0.06   Recent Lipid Panel    Component Value Date/Time   CHOL 236 (H) 05/05/2022 1220   TRIG (H) 05/05/2022 1220    419.0 Triglyceride is over 400; calculations on Lipids are invalid.   HDL 34.40 (L) 05/05/2022 1220   CHOLHDL 7 05/05/2022 1220   VLDL 39.4 10/31/2021 1129   LDLCALC 180 (H) 10/31/2021 1129    LDLDIRECT 144.0 05/05/2022 1220    Physical Exam:    VS:  BP (!) 160/104 (BP Location: Right Arm, Patient Position: Sitting, Cuff Size: Normal)   Pulse (!) 106   Ht 5\' 2"  (1.575 m)   Wt 163 lb 9.6 oz (74.2 kg)   LMP  (LMP Unknown)   BMI 29.92 kg/m  , BMI Body mass index is 29.92 kg/m. GENERAL:  Well appearing HEENT: Pupils equal round and reactive, fundi not visualized, oral mucosa unremarkable NECK:  No jugular venous distention, waveform within normal limits, carotid upstroke brisk and symmetric, no bruits, no thyromegaly LUNGS:  Clear to auscultation bilaterally HEART:  RRR.  PMI not displaced or sustained,S1 and S2 within normal limits, no S3, no S4, no clicks, no rubs, no murmurs ABD:  Flat, positive bowel sounds normal in frequency in pitch, no bruits, no rebound, no guarding, no midline pulsatile mass, no hepatomegaly, no splenomegaly EXT:  2 plus pulses throughout, no edema, no cyanosis no clubbing SKIN:  No rashes no nodules NEURO:  Cranial nerves II through XII grossly intact, motor grossly intact throughout PSYCH:  Cognitively intact, oriented to person place and time   ASSESSMENT:    1. Essential hypertension   2. Pre-procedure lab exam       PLAN:   Essential hypertension Blood pressure  remains uncontrolled.  It is better controlled at home but still above 130/80.  She has intolerance to many medications as listed above.  She does tolerate clonidine but struggles with fatigue when she takes it 3 times per day.  Continue twice daily for now.  She thinks that pain and stress levels are contributing.  She did have concern for mild renal artery stenosis bilaterally on renal artery Dopplers.  We will check a CT-a of the abdomen and if she does not have significant stenosis, will plan for renal denervation.  Continue clonidine and nebivolol.  Workup for secondary causes has otherwise been negative.   Disposition:  FU with Jocelynne Duquette C. Duke Salvia, MD, [facility] in 3-4 months.   Will need to be seen sooner if BP remains elevated.   Medication Adjustments/Labs and Tests Ordered: Current medicines are reviewed at length with the patient today.  Concerns regarding medicines are outlined above.  Orders Placed This Encounter  Procedures   CT ANGIO ABDOMEN W &/OR WO CONTRAST   Basic metabolic panel     No orders of the defined types were placed in this encounter.   Signed, Chilton Si, MD  05/12/2022 1:15 PM    [facility]

## 2022-05-12 NOTE — Assessment & Plan Note (Signed)
Blood pressure remains uncontrolled.  It is better controlled at home but still above 130/80.  She has intolerance to many medications as listed above.  She does tolerate clonidine but struggles with fatigue when she takes it 3 times per day.  Continue twice daily for now.  She thinks that pain and stress levels are contributing.  She did have concern for mild renal artery stenosis bilaterally on renal artery Dopplers.  We will check a CT-a of the abdomen and if she does not have significant stenosis, will plan for renal denervation.  Continue clonidine and nebivolol.  Workup for secondary causes has otherwise been negative.

## 2022-05-12 NOTE — Patient Instructions (Addendum)
Medication Instructions:  Your physician recommends that you continue on your current medications as directed. Please refer to the Current Medication list given to you today.  Labwork: BMET 1 WEEK PRIOR TO CT   Testing/Procedures: CTA OF ABDOMEN   Follow-Up: 10/07/2022 11:15 am with Dr Duke Salvia   Any Other Special Instructions Will Be Listed Below (If Applicable). READ OVER THE RENAL DENERVATION INFORMATION GIVEN  If you need a refill on your cardiac medications before your next appointment, please call your pharmacy.

## 2022-05-12 NOTE — Progress Notes (Signed)
Advanced Hypertension Clinic Follow up Assessment:    Date:  05/12/2022   ID:  Kristen [name], DOB February 07, 1975, MRN [id number]  PCP:  Bradd Canary, MD  Cardiologist:  None  Nephrologist:  Referring MD: Bradd Canary, MD   CC: Hypertension  History of Present Illness:    Kristen [name] is a 47 y.o. female with a hx of TIA, hypertension and hyperlipidemia here for follow-up. She initially established care in the hypertension clinic 08/11/2019. She struggled with low BP for years.  Then her BP spiked 09/2013.  She had a TIA and saw Dr. Allyson Sabal.  She continued to have intermittent spiking of her BP.  She struggled with several medications and finally went on Bystolic.  Her BP was well-controlled but she gained weight and had heaviness in her L arm.  She stopped it and felt much better. Her BP started to be higher after a stomach bug and tick bite.  She was treated with enalapril and the dose was increased.  Initially it worked well but stopped being as effective.  She had to start back on Bystolic but is gaining weight, fatigued, and has heaviness in her chest.  Her blood pressure is controlled but the symptoms are manageable.  She has not had any orthopnea or PND but does sometimes have edema that she attributes to enalapril.  Overall her diet has been good. Her mom had heart disease at a young age so she is used to eating a healthy diet.  Lean meat and limited salt intake.  She cooks at home.  She doesn't drink caffeine or EtOH.  She takes magnesium, B12 injections, vitamin ADEK.  She has been taking Adderall since 8th grade.  She stopped taking it for a few days and her BP was still high.  She has a history of hypertriglyceridemia since she was in middle school.  She has taken fish oil without much change in her numbers.  She is also taking Krill oil.  What helped most was drinking warm water with lemon.  She is averse to trying statins because many family members have had muscle aches.  Of note, her father  had a heart attack in his 61s.  Her brother had 2 heart attacks in his 37s.  Her mother has heart rhythm disorders.  At her initial appointment she was started on amlodipine. She stopped enalapril and Bystolic. Since then she saw our pharmacist and increased spironolactone 12/2019.  She was referred to the prep program through the [facility]. Her blood pressure was still elevated so she was started on hydralazine. She took one dose and it caused bladder issues, so she was switched to doxasozin. She also stopped spironolactone due to leg cramps. She saw our pharmacist on 02/2020 and switched from enalapril to olmesartan. She would not start carvedilol because it caused fatigue for her mother. She stopped olmesartan before follow-up with our pharmacist.  She was started on a low dose of carvedilol. She was also encouraged to follow-up with the sleep team and start a CPA and saw improvement in her BP.  At the last visit her blood pressure remained uncontrolled.  We discussed trying clonidine but she was hesitant.  She started clonidine 07/2021 with her PCP.  At her follow-up visit 10/2021 she reported some intermittent chest pain at night that was sharp.     Blood pressure has been well-controlled with clonidine since July, taking a full tablet in the morning, half a tablet around three, and  a full tablet in the evening. She mentions occasional spikes in blood pressure but overall feels that this is the most controlled it has been for an extended period.  She experienced palpitations approximately a month and a half ago, initially suspecting anxiety attacks. She started using a natural cream for estrogen, which has stopped the palpitations completely. The patient has a history of Hashimoto's thyroiditis, with a recent thyroid panel showing a normal TSH but an abnormal T4. She has had an ultrasound for her thyroid and has a renal ultrasound scheduled due to a long-standing cyst on her kidney. She has previously been  tested for pheochromocytoma with catecholamines and metanephrines lab tests, which were negative.  [name] reports that her hemoglobin and hematocrit levels have been slightly elevated, but she has been diligent with her water intake, ruling out dehydration as a cause. She has noticed that her blood pressure feels better when taking aspirin and wonders if there is too much thickening happening in her blood.  At her visit 11/2021 her blood pressure was much more elevated than usual.  In general at home it was well-controlled.  Clonidine was increased to 0.1 mg in the afternoon.  She had nebivolol to take as needed.  Renal artery Dopplers 11/2021 revealed mild stenosis bilaterally. Today, she states that she is doing relatively well. For 3 months she has received steroidal injections in her back with much improvement in her pain. Generally she feels awful when her blood pressure is low for her but normal clinically, and she has high energy levels with higher blood pressures such as today. In clinic today her blood pressure is elevated to 164/117, and 160/104 on manual recheck. She notes that she was rushing this morning. At home, her blood pressure is averaging 125-134/85-95. Clonidine was recently increased but she developed fatigue. She confirms that she has only been able to tolerate antihypertensives in smaller doses. She denies any palpitations, chest pain, shortness of breath, or peripheral edema. No lightheadedness, headaches, syncope, orthopnea, or PND.  Previous antihypertensives: HCTZ - AKI, bladder issues, and kidney stones Losartan-didn't work Bystolic- fatigue, arm heaviness Enalapril- swelling at higher dose, fatigue. Amlodipine - urine retention, edema, leg tighten, elevated HR and BP Hydralazine - urinary retention Spironolactone - muscle cramping Doxazosin - would not take due to side effects listed in PI (weight gain, fatigue)    Past Medical History:  Diagnosis Date   Abnormal  cervical cytology 09/26/2011   LGSIL in 2011, patient reports repeat pap was normal, has had intermittent abnormals with bx in past but always normalized, has had cryotherapy with good results Menarche at 17, irregular without birth control at times, very heavy LMP 08/26/2011 No gyn concerns, MGM last year normal    ADD (attention deficit disorder)    Allergy    seasonal   Anemia    hx of none recent per pt on 12-27-2020   Asthma    mild, intermittent   Chicken pox as a child   Chronic kidney disease    Claustrophobia 12/27/2020   Concussion 09/24/2010   DJD (degenerative joint disease)    lower back   Family history of heart disease    Female bladder prolapse, acquired 09/24/2010   Fibromyalgia    Genital herpes 12/27/2020   GERD (gastroesophageal reflux disease)    Headache 09/26/2013   History of concussion 09/24/2010   headaches  x 8 months  and  memeory problems for months no current residual from   History of kidney  stones    History of migraines 09/24/2010   History of postpartum depression 2002   HSV-2 infection 09/24/2010   HTN (hypertension) 09/24/2010   Hyperlipidemia    Hypertension    Hypertensive urgency 09/24/2010   hypertension   Hypertriglyceridemia 08/11/2019   Hypothyroidism    Interstitial cystitis 02/18/2011   Left shoulder pain 05/27/2016   Migraine 09/24/2010   Migraine aura, persistent, with cerebral infarct, status over 72 hours 09/24/2010   Myalgia 06/08/2018   Neck pain, musculoskeletal 12/16/2010   oa  and djd no limited neck motion   OSA (obstructive sleep apnea) 08/28/2020   using auto set 6 to 17   Overweight 05/29/2015   Palpitations 11/20/2021   Pharyngitis    sore throat and hurts when yawns since nov t & s surgery is improving   Pneumonia    last time 2012   Poor concentration 12/16/2010   Recurrent UTI    last uti 1st of december   Self-catheterizes urinary bladder 12/27/2020   prn   Sleep apnea    Stroke (HCC) 2015   Subacute  and chronic vaginitis 09/24/2010   TIA (transient ischemic attack) 2015   saw dr berry none since   Tick bite 12/04/2013   rocky mountain spotted fever x 3 months last time 2015   Urinary incontinence    weras pads   Vaginitis and vulvovaginitis 05/21/2014   Wears glasses    or contacts    Past Surgical History:  Procedure Laterality Date   ABDOMINAL SACROCOLPOPEXY N/A 12/31/2020   Procedure: ABDOMINO SACROCOLPOPEXY;  Surgeon: Marguerita Beards, MD;  [facility]: [facility];  Service: Gynecology;  Laterality: N/A;   ANTERIOR AND POSTERIOR REPAIR  2003   colonscopy  2017   polyps removed due now   CYSTOSCOPY N/A 12/31/2020   Procedure: CYSTOSCOPY;  Surgeon: Marguerita Beards, MD;  Location: [facility];  Service: Gynecology;  Laterality: N/A;   INCONTINENCE SURGERY  01/13/2001   ROBOTIC ASSISTED TOTAL HYSTERECTOMY N/A 12/31/2020   Procedure: XI ROBOTIC ASSISTED TOTAL HYSTERECTOMY with bilateral salpingectomy;  Surgeon: Marguerita Beards, MD;  Location[facility];  Service: Gynecology;  Laterality: N/A;   TONSILLECTOMY  11/15/2020   and adenoids removed due to constant infections at [facility] [place]    Current Medications: Current Meds  Medication Sig   acyclovir (ZOVIRAX) 200 MG capsule TAKE 1 CAPSULE BY MOUTH TWICE A DAY INS LIMIT OF 30 DAYS   albuterol (VENTOLIN HFA) 108 (90 Base) MCG/ACT inhaler Inhale 2 puffs into the lungs every 6 (six) hours as needed.   ALPRAZolam (XANAX) 0.5 MG tablet 1 tablet by mouth 30 minutes prior to flight.   amphetamine-dextroamphetamine (ADDERALL) 20 MG tablet Take 1 tablet (20 mg total) by mouth 2 (two) times daily. July 2023   amphetamine-dextroamphetamine (ADDERALL) 20 MG tablet Take 1 tablet (20 mg total) by mouth 2 (two) times daily. April 2024   budesonide-formoterol (SYMBICORT) 160-4.5 MCG/ACT inhaler TAKE 2 PUFFS BY MOUTH TWICE A DAY   cefdinir (OMNICEF) 300 MG capsule  Take 1 capsule (300 mg total) by mouth 2 (two) times daily for 10 days.   cloNIDine (CATAPRES) 0.1 MG tablet Take 2 tablets (0.2 mg total) by mouth 3 (three) times daily.   Cyanocobalamin (VITAMIN B 12 PO) Take by mouth. Vitamin b 12 subligual daily   dicyclomine (BENTYL) 10 MG capsule Take 1 capsule (10 mg total) by mouth every 6 (six) hours as needed (abdominal pain).  EPINEPHrine 0.3 mg/0.3 mL IJ SOAJ injection Inject 0.3 mg into the muscle as needed for anaphylaxis.   estradiol (CLIMARA - DOSED IN MG/24 HR) 0.025 mg/24hr patch Place 1 patch (0.025 mg total) onto the skin every 3 (three) days.   fluconazole (DIFLUCAN) 150 MG tablet Take 1 tablet (150 mg total) by mouth once a week. Takes prn   HYDROcodone-acetaminophen (NORCO/VICODIN) 5-325 MG tablet Take 1 tablet by mouth every 6 (six) hours as needed for moderate pain.   hydrOXYzine (ATARAX) 50 MG tablet Take 1 tablet (50 mg total) by mouth every 6 (six) hours as needed for anxiety.   lidocaine (XYLOCAINE) 5 % ointment Apply 1 Application topically daily as needed.   liothyronine (CYTOMEL) 5 MCG tablet TAKE 1 TABLET BY MOUTH EVERY DAY   MAGNESIUM PO Take by mouth. Otc daily   meloxicam (MOBIC) 7.5 MG tablet TAKE 1 TABLET(7.5 MG) BY MOUTH DAILY   methylPREDNISolone (MEDROL DOSEPAK) 4 MG TBPK tablet 5 tabs po x 1 day then 4 tabs po x 1 day then 3 tabs po x 1 day then 2 tabs po x 1 day then 1 tab po x 1 day and stop   Multiple Vitamins-Minerals (MULTIVITAMIN WITH MINERALS) tablet Take 1 tablet by mouth daily.   Naltrexone HCl, Pain, 1.5 MG CAPS Take 2-3 tablets by mouth daily.   nebivolol (BYSTOLIC) 5 MG tablet 1 tablet by mouth once daily as needed for systolic blood pressure >160.   neomycin-polymyxin b-dexamethasone (MAXITROL) 3.5-10000-0.1 SUSP Place 1 drop into both eyes in the morning, at noon, in the evening, and at bedtime.   nitrofurantoin, macrocrystal-monohydrate, (MACROBID) 100 MG capsule TAKE 1 CAPSULE FOR 1 DOSE AS NEEDED FOR  PREVENTION OF UTI *AFTER SELF CATH, SWIMMING, HOT TUB, SEX*   ondansetron (ZOFRAN) 4 MG tablet Take 1 tablet (4 mg total) by mouth every 8 (eight) hours as needed for nausea or vomiting.   SYNTHROID 137 MCG tablet TAKE 1 TABLET(137 MCG) BY MOUTH DAILY   tobramycin-dexamethasone (TOBRADEX) ophthalmic solution Place 1 drop into both eyes every 4 (four) hours while awake.     Allergies:   Chocolate, Peanut-containing drug products, Clindamycin/lincomycin, Estradiol, Influenza vaccines, Bystolic [nebivolol hcl], Doxazosin, Enalapril, Hydralazine, Hydrochlorothiazide, Losartan, Spiractone [spironolactone], Terazol [terconazole], and Latex   Social History   Socioeconomic History   Marital status: Married    Spouse name: Not on file   Number of children: Not on file   Years of education: Not on file   Highest education level: Associate degree: occupational, Scientist, product/process development, or vocational program  Occupational History   Not on file  Tobacco Use   Smoking status: Never   Smokeless tobacco: Never  Vaping Use   Vaping Use: Never used  Substance and Sexual Activity   Alcohol use: No    Alcohol/week: 0.0 standard drinks of alcohol    Comment: 0   Drug use: No   Sexual activity: Yes    Partners: Male    Birth control/protection: Pill  Other Topics Concern   Not on file  Social History Narrative   Lives with husband 2 children in a one story home.     Works as a Teacher, adult education.     Education: college.   Social Determinants of Health   Financial Resource Strain: High Risk (05/05/2022)   Overall Financial Resource Strain (CARDIA)    Difficulty of Paying Living Expenses: Hard  Food Insecurity: No Food Insecurity (05/05/2022)   Hunger Vital Sign    Worried About Running Out  of Food in the Last Year: Never true    Ran Out of Food in the Last Year: Never true  Transportation Needs: No Transportation Needs (05/05/2022)   PRAPARE - Administrator, Civil Service (Medical): No    Lack  of Transportation (Non-Medical): No  Physical Activity: Sufficiently Active (05/05/2022)   Exercise Vital Sign    Days of Exercise per Week: 5 days    Minutes of Exercise per Session: 40 min  Stress: Stress Concern Present (05/05/2022)   [employer] of Occupational Health - Occupational Stress Questionnaire    Feeling of Stress : Very much  Social Connections: Moderately Isolated (05/05/2022)   Social Connection and Isolation Panel [NHANES]    Frequency of Communication with Friends and Family: Patient declined    Frequency of Social Gatherings with Friends and Family: More than three times a week    Attends Religious Services: Never    Database administrator or Organizations: No    Attends Engineer, structural: Not on file    Marital Status: Married     Family History: The patient's family history includes Alcohol abuse in her father and paternal grandfather; Crohn's disease in her maternal aunt; Dementia (age of onset: 29) in her maternal grandfather; Heart attack in her father and paternal grandfather; Heart attack (age of onset: 10) in her brother; Heart disease in her father, maternal grandmother, and paternal grandfather; Heart disease (age of onset: 36) in her brother; Heart failure in her maternal grandfather and maternal grandmother; Hyperlipidemia in her brother, father, mother, and paternal grandfather; Hypertension in her brother, father, and paternal grandfather; Kidney disease in her father; Osteoporosis in her mother; Stroke in her maternal grandfather and paternal grandfather; Valvular heart disease in her maternal grandmother. There is no history of Colon cancer, Stomach cancer, Esophageal cancer, Pancreatic cancer, or Rectal cancer.  ROS:   Please see the history of present illness.    (+) Fatigue All other systems reviewed and are negative.  EKGs/Labs/Other Studies Reviewed:    Bilateral Renal Artery Doppler  11/25/2021: Summary:  Renal:    Right: 1-59%  stenosis of the right renal artery. RRV flow present.  Left:  Cyst(s) noted. 1-59% stenosis of the left renal artery. LRV         flow present.   Coronary Calcium Score 08/30/2019: FINDINGS: Non-cardiac: See separate report from [facility]. Ascending Aorta: Pericardium: Normal Coronary arteries: No coronary calcification. IMPRESSION: Coronary calcium score of 0. This was 0 percentile for age and sex matched control.  EKG:   EKG is personally reviewed. 05/12/2022:  EKG was not ordered. 08/28/2020: Sinus rhythm. Rate 96 bpm. 08/11/2019: sinus rhythm.  Rate 88 bpm.  Recent Labs: 05/05/2022: ALT 20; BUN 15; Creatinine, Ser 0.81; Hemoglobin 14.6; Platelets 190.0; Potassium 4.0; Sodium 138; TSH 0.06   Recent Lipid Panel    Component Value Date/Time   CHOL 236 (H) 05/05/2022 1220   TRIG (H) 05/05/2022 1220    419.0 Triglyceride is over 400; calculations on Lipids are invalid.   HDL 34.40 (L) 05/05/2022 1220   CHOLHDL 7 05/05/2022 1220   VLDL 39.4 10/31/2021 1129   LDLCALC 180 (H) 10/31/2021 1129   LDLDIRECT 144.0 05/05/2022 1220    Physical Exam:    VS:  BP (!) 160/104 (BP Location: Right Arm, Patient Position: Sitting, Cuff Size: Normal)   Pulse (!) 106   Ht 5\' 2"  (1.575 m)   Wt 163 lb 9.6 oz (74.2 kg)   LMP  (  LMP Unknown)   BMI 29.92 kg/m  , BMI Body mass index is 29.92 kg/m. GENERAL:  Well appearing HEENT: Pupils equal round and reactive, fundi not visualized, oral mucosa unremarkable NECK:  No jugular venous distention, waveform within normal limits, carotid upstroke brisk and symmetric, no bruits, no thyromegaly LUNGS:  Clear to auscultation bilaterally HEART:  RRR.  PMI not displaced or sustained,S1 and S2 within normal limits, no S3, no S4, no clicks, no rubs, no murmurs ABD:  Flat, positive bowel sounds normal in frequency in pitch, no bruits, no rebound, no guarding, no midline pulsatile mass, no hepatomegaly, no splenomegaly EXT:  2 plus pulses throughout,  no edema, no cyanosis no clubbing SKIN:  No rashes no nodules NEURO:  Cranial nerves II through XII grossly intact, motor grossly intact throughout PSYCH:  Cognitively intact, oriented to person place and time   ASSESSMENT:    1. Essential hypertension   2. Pre-procedure lab exam     PLAN:    Essential hypertension Blood pressure remains uncontrolled.  It is better controlled at home but still above 130/80.  She has intolerance to many medications as listed above.  She does tolerate clonidine but struggles with fatigue when she takes it 3 times per day.  Continue twice daily for now.  She thinks that pain and stress levels are contributing.  She did have concern for mild renal artery stenosis bilaterally on renal artery Dopplers.  We will check a CT-a of the abdomen and if she does not have significant stenosis, will plan for renal denervation.  Continue clonidine and nebivolol.  Workup for secondary causes has otherwise been negative.   Disposition:  FU with APP in 4 months.    Medication Adjustments/Labs and Tests Ordered: Current medicines are reviewed at length with the patient today.  Concerns regarding medicines are outlined above.   Orders Placed This Encounter  Procedures   CT ANGIO ABDOMEN W &/OR WO CONTRAST   Basic metabolic panel   No orders of the defined types were placed in this encounter.  I,Mathew Stumpf,acting as a Neurosurgeon for Chilton Si, MD.,have documented all relevant documentation on the behalf of Chilton Si, MD,as directed by  Chilton Si, MD while in the presence of Chilton Si, MD.  I, Renetta Suman C. Duke Salvia, MD have reviewed all documentation for this visit.  The documentation of the exam, diagnosis, procedures, and orders on 05/12/2022 are all accurate and complete.   Guadalupe Maple  05/12/2022 11:53 AM    [facility]

## 2022-05-27 ENCOUNTER — Ambulatory Visit (HOSPITAL_BASED_OUTPATIENT_CLINIC_OR_DEPARTMENT_OTHER): Admission: RE | Admit: 2022-05-27 | Payer: BC Managed Care – PPO | Source: Ambulatory Visit

## 2022-06-06 DIAGNOSIS — I10 Essential (primary) hypertension: Secondary | ICD-10-CM | POA: Diagnosis not present

## 2022-06-06 DIAGNOSIS — Z01812 Encounter for preprocedural laboratory examination: Secondary | ICD-10-CM | POA: Diagnosis not present

## 2022-06-07 LAB — BASIC METABOLIC PANEL
BUN/Creatinine Ratio: 24 — ABNORMAL HIGH (ref 9–23)
BUN: 16 mg/dL (ref 6–24)
CO2: 23 mmol/L (ref 20–29)
Calcium: 9.5 mg/dL (ref 8.7–10.2)
Chloride: 100 mmol/L (ref 96–106)
Creatinine, Ser: 0.67 mg/dL (ref 0.57–1.00)
Glucose: 51 mg/dL — ABNORMAL LOW (ref 70–99)
Potassium: 4.6 mmol/L (ref 3.5–5.2)
Sodium: 140 mmol/L (ref 134–144)
eGFR: 109 mL/min/{1.73_m2} (ref >59)

## 2022-06-10 ENCOUNTER — Other Ambulatory Visit: Payer: Self-pay | Admitting: Family Medicine

## 2022-06-12 ENCOUNTER — Telehealth: Payer: Self-pay | Admitting: Cardiovascular Disease

## 2022-06-12 ENCOUNTER — Ambulatory Visit (HOSPITAL_BASED_OUTPATIENT_CLINIC_OR_DEPARTMENT_OTHER)
Admission: RE | Admit: 2022-06-12 | Discharge: 2022-06-12 | Disposition: A | Discharge status: Home / Self Care | Payer: BC Managed Care – PPO | Source: Ambulatory Visit | Attending: Cardiovascular Disease | Admitting: Cardiovascular Disease

## 2022-06-12 ENCOUNTER — Other Ambulatory Visit: Payer: Self-pay | Admitting: Family Medicine

## 2022-06-12 DIAGNOSIS — I10 Essential (primary) hypertension: Secondary | ICD-10-CM | POA: Insufficient documentation

## 2022-06-12 DIAGNOSIS — I701 Atherosclerosis of renal artery: Secondary | ICD-10-CM | POA: Diagnosis not present

## 2022-06-12 MED ORDER — IOHEXOL 350 MG/ML SOLN
100.0000 mL | Freq: Once | INTRAVENOUS | Status: AC | PRN
  Administered 2022-06-12: 85 mL via INTRAVENOUS

## 2022-06-12 NOTE — Telephone Encounter (Signed)
Per Dr. Cristal Deer labs okay for CT, returned call to patient let her know!

## 2022-06-12 NOTE — Telephone Encounter (Signed)
Pt calling in today. She has her CT today at 5pm and wants to know based off her lab results, is it safe to still have the ct with contrast done today. Please advise.   She ask that you please leave a detailed messaged if she does not answer

## 2022-06-15 ENCOUNTER — Encounter (HOSPITAL_BASED_OUTPATIENT_CLINIC_OR_DEPARTMENT_OTHER): Payer: Self-pay | Admitting: Cardiovascular Disease

## 2022-06-16 NOTE — Telephone Encounter (Signed)
She is referencing RDN. Not sure if plan was for office visit to rediscuss RDN or to trial prior auth. CT no renal artery stenosis.   Alver Sorrow, NP

## 2022-06-17 ENCOUNTER — Other Ambulatory Visit (HOSPITAL_BASED_OUTPATIENT_CLINIC_OR_DEPARTMENT_OTHER): Payer: Self-pay | Admitting: Cardiovascular Disease

## 2022-06-17 DIAGNOSIS — I1A0 Resistant hypertension: Secondary | ICD-10-CM

## 2022-06-17 MED ORDER — SODIUM CHLORIDE 0.9% FLUSH
3.0000 mL | Freq: Two times a day (BID) | INTRAVENOUS | Status: DC
Start: 2022-06-17 — End: 2023-07-14

## 2022-07-01 ENCOUNTER — Telehealth: Payer: Self-pay | Admitting: Family Medicine

## 2022-07-01 NOTE — Telephone Encounter (Signed)
Patient called and states pharmacy still does not have the refill for the ADDERALL sent to [facility] on [address]. She states she has been out for two weeks already. Please advise

## 2022-07-02 ENCOUNTER — Other Ambulatory Visit: Payer: Self-pay | Admitting: Family Medicine

## 2022-07-02 MED ORDER — AMPHETAMINE-DEXTROAMPHETAMINE 20 MG PO TABS: 20.0000 mg | ORAL_TABLET | Freq: Two times a day (BID) | ORAL | 0 refills | Status: DC

## 2022-07-02 NOTE — Telephone Encounter (Signed)
Pt.notified

## 2022-07-15 DIAGNOSIS — M533 Sacrococcygeal disorders, not elsewhere classified: Secondary | ICD-10-CM | POA: Diagnosis not present

## 2022-07-15 DIAGNOSIS — M545 Low back pain, unspecified: Secondary | ICD-10-CM | POA: Diagnosis not present

## 2022-07-15 DIAGNOSIS — M7062 Trochanteric bursitis, left hip: Secondary | ICD-10-CM | POA: Diagnosis not present

## 2022-07-16 ENCOUNTER — Encounter: Payer: Self-pay | Admitting: Family Medicine

## 2022-07-16 ENCOUNTER — Other Ambulatory Visit: Payer: Self-pay | Admitting: Family Medicine

## 2022-07-16 DIAGNOSIS — S81831A Puncture wound without foreign body, right lower leg, initial encounter: Secondary | ICD-10-CM | POA: Diagnosis not present

## 2022-07-16 NOTE — Telephone Encounter (Signed)
Hey just following up on this renal denerv precert?

## 2022-07-24 DIAGNOSIS — M533 Sacrococcygeal disorders, not elsewhere classified: Secondary | ICD-10-CM | POA: Diagnosis not present

## 2022-08-19 ENCOUNTER — Other Ambulatory Visit: Payer: Self-pay | Admitting: Family Medicine

## 2022-08-19 ENCOUNTER — Other Ambulatory Visit: Payer: Self-pay | Admitting: Family

## 2022-08-19 MED ORDER — AMPHETAMINE-DEXTROAMPHETAMINE 20 MG PO TABS: 20.0000 mg | ORAL_TABLET | Freq: Two times a day (BID) | ORAL | 0 refills | Status: DC

## 2022-08-19 MED ORDER — TOBRAMYCIN-DEXAMETHASONE 0.3-0.1 % OP SUSP: 1.0000 [drp] | OPHTHALMIC | 0 refills | Status: DC

## 2022-08-31 ENCOUNTER — Other Ambulatory Visit: Payer: Self-pay | Admitting: Family Medicine

## 2022-09-30 ENCOUNTER — Other Ambulatory Visit: Payer: Self-pay | Admitting: Family Medicine

## 2022-10-01 MED ORDER — AMPHETAMINE-DEXTROAMPHETAMINE 20 MG PO TABS: 20.0000 mg | ORAL_TABLET | Freq: Two times a day (BID) | ORAL | 0 refills | Status: DC

## 2022-10-01 MED ORDER — ESTRADIOL 0.025 MG/24HR TD PTTW: 1.0000 | MEDICATED_PATCH | TRANSDERMAL | 0 refills | Status: DC

## 2022-10-01 NOTE — Telephone Encounter (Signed)
Requesting: Adderall 20mg  Contract: 02/03/2022 UDS: 02/03/2022 Last Visit: 05/05/2022 Next Visit: 11/10/2022 Last Refill: 08/19/2022  Please Advise

## 2022-10-07 ENCOUNTER — Encounter (HOSPITAL_BASED_OUTPATIENT_CLINIC_OR_DEPARTMENT_OTHER): Payer: Self-pay | Admitting: Cardiovascular Disease

## 2022-10-07 ENCOUNTER — Ambulatory Visit (INDEPENDENT_AMBULATORY_CARE_PROVIDER_SITE_OTHER): Payer: BC Managed Care – PPO | Admitting: Cardiovascular Disease

## 2022-10-07 ENCOUNTER — Other Ambulatory Visit: Payer: Self-pay | Admitting: Family Medicine

## 2022-10-07 VITALS — BP 132/84 | HR 87 | Ht 62.0 in | Wt 142.6 lb

## 2022-10-07 DIAGNOSIS — G4733 Obstructive sleep apnea (adult) (pediatric): Secondary | ICD-10-CM

## 2022-10-07 DIAGNOSIS — I10 Essential (primary) hypertension: Secondary | ICD-10-CM

## 2022-10-07 DIAGNOSIS — E782 Mixed hyperlipidemia: Secondary | ICD-10-CM

## 2022-10-07 DIAGNOSIS — E063 Autoimmune thyroiditis: Secondary | ICD-10-CM | POA: Diagnosis not present

## 2022-10-07 NOTE — Progress Notes (Deleted)
Advanced Hypertension Clinic Follow up Assessment:    Date:  10/07/2022   ID:  Kristen [name], DOB 1975/02/26, MRN [id number]  PCP:  Bradd Canary, MD  Cardiologist:  None  Nephrologist:  Referring MD: Bradd Canary, MD   CC: Hypertension  History of Present Illness:    Kristen [name] is a 47 y.o. female with a hx of TIA, Hashimoto's thyroiditis, hypertension and hyperlipidemia here for follow-up. She initially established care in the hypertension clinic 08/11/2019. She struggled with low BP for years.  Then her BP spiked 09/2013.  She had a TIA and saw Dr. Allyson Sabal.  She continued to have intermittent spiking of her BP.  She struggled with several medications and finally went on Bystolic.  Her BP was well-controlled but she gained weight and had heaviness in her L arm.  She stopped it and felt much better. Her BP started to be higher after a stomach bug and tick bite.  She was treated with enalapril and the dose was increased.  Initially it worked well but stopped being as effective.  She had to start back on Bystolic but is gaining weight, fatigued, and has heaviness in her chest.  Her blood pressure is controlled but the symptoms are manageable.  She has not had any orthopnea or PND but does sometimes have edema that she attributes to enalapril.  Overall her diet has been good. Her mom had heart disease at a young age so she is used to eating a healthy diet.  Lean meat and limited salt intake.  She cooks at home.  She doesn't drink caffeine or EtOH.  She takes magnesium, B12 injections, vitamin ADEK.  She has been taking Adderall since 8th grade.  She stopped taking it for a few days and her BP was still high.  She has a history of hypertriglyceridemia since she was in middle school.  She has taken fish oil without much change in her numbers.  She is also taking Krill oil.  What helped most was drinking warm water with lemon.  She is averse to trying statins because many family members have had muscle  aches.  Of note, her father had a heart attack in his 20s.  Her brother had 2 heart attacks in his 64s.  Her mother has heart rhythm disorders.  At her initial appointment she was started on amlodipine. She stopped enalapril and Bystolic. Since then she saw our pharmacist and increased spironolactone 12/2019.  She was referred to the prep program through the [facility]. Her blood pressure was still elevated so she was started on hydralazine. She took one dose and it caused bladder issues, so she was switched to doxasozin. She also stopped spironolactone due to leg cramps. She saw our pharmacist on 02/2020 and switched from enalapril to olmesartan. She would not start carvedilol because it caused fatigue for her mother. She stopped olmesartan before follow-up with our pharmacist.  She was started on a low dose of carvedilol. She was also encouraged to follow-up with the sleep team and start a CPAP and saw improvement in her BP.  At the last visit her blood pressure remained uncontrolled.  We discussed trying clonidine but she was hesitant.  She started clonidine 07/2021 with her PCP.  At her follow-up visit 10/2021 she reported some intermittent chest pain at night that was sharp.     The patient has a history of Hashimoto's thyroiditis, with a recent thyroid panel showing a normal TSH but an abnormal  T4. She has had an ultrasound for her thyroid and has a renal ultrasound scheduled due to a long-standing cyst on her kidney. She has previously been tested for pheochromocytoma with catecholamines and metanephrines lab tests, which were negative.  Blood pressures have remained uncontrolled.  Clonidine has been reduced due to fatigue.  At her visit 04/2022 blood pressure remained uncontrolled.  We discussed renal denervation but it has not yet been performed.  CT of the abdomen was negative for renal artery stenosis and no adenomas were noted.  Previous antihypertensives: HCTZ - AKI, bladder issues, and kidney  stones Losartan-didn't work Bystolic- fatigue, arm heaviness Enalapril- swelling at higher dose, fatigue. Amlodipine - urine retention, edema, leg tighten, elevated HR and BP Hydralazine - urinary retention Spironolactone - muscle cramping Doxazosin - would not take due to side effects listed in PI (weight gain, fatigue)    Past Medical History:  Diagnosis Date   Abnormal cervical cytology 09/26/2011   LGSIL in 2011, patient reports repeat pap was normal, has had intermittent abnormals with bx in past but always normalized, has had cryotherapy with good results Menarche at 17, irregular without birth control at times, very heavy LMP 08/26/2011 No gyn concerns, MGM last year normal    ADD (attention deficit disorder)    Allergy    seasonal   Anemia    hx of none recent per pt on 12-27-2020   Asthma    mild, intermittent   Chicken pox as a child   Chronic kidney disease    Claustrophobia 12/27/2020   Concussion 09/24/2010   DJD (degenerative joint disease)    lower back   Family history of heart disease    Female bladder prolapse, acquired 09/24/2010   Fibromyalgia    Genital herpes 12/27/2020   GERD (gastroesophageal reflux disease)    Headache 09/26/2013   History of concussion 09/24/2010   headaches  x 8 months  and  memeory problems for months no current residual from   History of kidney stones    History of migraines 09/24/2010   History of postpartum depression 2002   HSV-2 infection 09/24/2010   HTN (hypertension) 09/24/2010   Hyperlipidemia    Hypertension    Hypertensive urgency 09/24/2010   hypertension   Hypertriglyceridemia 08/11/2019   Hypothyroidism    Interstitial cystitis 02/18/2011   Left shoulder pain 05/27/2016   Migraine 09/24/2010   Migraine aura, persistent, with cerebral infarct, status over 72 hours 09/24/2010   Myalgia 06/08/2018   Neck pain, musculoskeletal 12/16/2010   oa  and djd no limited neck motion   OSA (obstructive sleep apnea)  08/28/2020   using auto set 6 to 17   Overweight 05/29/2015   Palpitations 11/20/2021   Pharyngitis    sore throat and hurts when yawns since nov t & s surgery is improving   Pneumonia    last time 2012   Poor concentration 12/16/2010   Recurrent UTI    last uti 1st of december   Self-catheterizes urinary bladder 12/27/2020   prn   Sleep apnea    Stroke (HCC) 2015   Subacute and chronic vaginitis 09/24/2010   TIA (transient ischemic attack) 2015   saw dr berry none since   Tick bite 12/04/2013   rocky mountain spotted fever x 3 months last time 2015   Urinary incontinence    weras pads   Vaginitis and vulvovaginitis 05/21/2014   Wears glasses    or contacts    Past Surgical History:  Procedure  Laterality Date   ABDOMINAL SACROCOLPOPEXY N/A 12/31/2020   Procedure: ABDOMINO SACROCOLPOPEXY;  Surgeon: Marguerita Beards, MD;  Location: [facility];  Service: Gynecology;  Laterality: N/A;   ANTERIOR AND POSTERIOR REPAIR  2003   colonscopy  2017   polyps removed due now   CYSTOSCOPY N/A 12/31/2020   Procedure: CYSTOSCOPY;  Surgeon: Marguerita Beards, MD;  Location: [facility];  Service: Gynecology;  Laterality: N/A;   INCONTINENCE SURGERY  01/13/2001   ROBOTIC ASSISTED TOTAL HYSTERECTOMY N/A 12/31/2020   Procedure: XI ROBOTIC ASSISTED TOTAL HYSTERECTOMY with bilateral salpingectomy;  Surgeon: Marguerita Beards, MD;  Location: [facility];  Service: Gynecology;  Laterality: N/A;   TONSILLECTOMY  11/15/2020   and adenoids removed due to constant infections at [facility] [place]    Current Medications: No outpatient medications have been marked as taking for the 10/07/22 encounter (Appointment) with Chilton Si, MD.   Current Facility-Administered Medications for the 10/07/22 encounter (Appointment) with Chilton Si, MD  Medication   sodium chloride flush (NS) 0.9 % injection 3 mL     Allergies:    Chocolate, Peanut-containing drug products, Clindamycin/lincomycin, Estradiol, Influenza vaccines, Bystolic [nebivolol hcl], Doxazosin, Enalapril, Hydralazine, Hydrochlorothiazide, Losartan, Spiractone [spironolactone], Terazol [terconazole], and Latex   Social History   Socioeconomic History   Marital status: Married    Spouse name: Not on file   Number of children: Not on file   Years of education: Not on file   Highest education level: Associate degree: occupational, Scientist, product/process development, or vocational program  Occupational History   Not on file  Tobacco Use   Smoking status: Never   Smokeless tobacco: Never  Vaping Use   Vaping status: Never Used  Substance and Sexual Activity   Alcohol use: No    Alcohol/week: 0.0 standard drinks of alcohol    Comment: 0   Drug use: No   Sexual activity: Yes    Partners: Male    Birth control/protection: Pill  Other Topics Concern   Not on file  Social History Narrative   Lives with husband 2 children in a one story home.     Works as a Teacher, adult education.     Education: college.   Social Determinants of Health   Financial Resource Strain: High Risk (05/05/2022)   Overall Financial Resource Strain (CARDIA)    Difficulty of Paying Living Expenses: Hard  Food Insecurity: No Food Insecurity (05/05/2022)   Hunger Vital Sign    Worried About Running Out of Food in the Last Year: Never true    Ran Out of Food in the Last Year: Never true  Transportation Needs: No Transportation Needs (05/05/2022)   PRAPARE - Administrator, Civil Service (Medical): No    Lack of Transportation (Non-Medical): No  Physical Activity: Sufficiently Active (05/05/2022)   Exercise Vital Sign    Days of Exercise per Week: 5 days    Minutes of Exercise per Session: 40 min  Stress: Stress Concern Present (05/05/2022)   [employer] of Occupational Health - Occupational Stress Questionnaire    Feeling of Stress : Very much  Social Connections: Moderately  Isolated (05/05/2022)   Social Connection and Isolation Panel [NHANES]    Frequency of Communication with Friends and Family: Patient declined    Frequency of Social Gatherings with Friends and Family: More than three times a week    Attends Religious Services: Never    Database administrator or Organizations: No  Attends Banker Meetings: Not on file    Marital Status: Married     Family History: The patient's family history includes Alcohol abuse in her father and paternal grandfather; Crohn's disease in her maternal aunt; Dementia (age of onset: 64) in her maternal grandfather; Heart attack in her father and paternal grandfather; Heart attack (age of onset: 55) in her brother; Heart disease in her father, maternal grandmother, and paternal grandfather; Heart disease (age of onset: 68) in her brother; Heart failure in her maternal grandfather and maternal grandmother; Hyperlipidemia in her brother, father, mother, and paternal grandfather; Hypertension in her brother, father, and paternal grandfather; Kidney disease in her father; Osteoporosis in her mother; Stroke in her maternal grandfather and paternal grandfather; Valvular heart disease in her maternal grandmother. There is no history of Colon cancer, Stomach cancer, Esophageal cancer, Pancreatic cancer, or Rectal cancer.  ROS:   Please see the history of present illness.    (+) Fatigue All other systems reviewed and are negative.  EKGs/Labs/Other Studies Reviewed:    Bilateral Renal Artery Doppler  11/25/2021: Summary:  Renal:    Right: 1-59% stenosis of the right renal artery. RRV flow present.  Left:  Cyst(s) noted. 1-59% stenosis of the left renal artery. LRV         flow present.   Coronary Calcium Score 08/30/2019: FINDINGS: Non-cardiac: See separate report from [facility]. Ascending Aorta: Pericardium: Normal Coronary arteries: No coronary calcification. IMPRESSION: Coronary calcium score of 0.  This was 0 percentile for age and sex matched control.  EKG:   EKG is personally reviewed. 05/12/2022:  EKG was not ordered. 08/28/2020: Sinus rhythm. Rate 96 bpm. 08/11/2019: sinus rhythm.  Rate 88 bpm.  Recent Labs: 05/05/2022: ALT 20; Hemoglobin 14.6; Platelets 190.0; TSH 0.06 06/06/2022: BUN 16; Creatinine, Ser 0.67; Potassium 4.6; Sodium 140   Recent Lipid Panel    Component Value Date/Time   CHOL 236 (H) 05/05/2022 1220   TRIG (H) 05/05/2022 1220    419.0 Triglyceride is over 400; calculations on Lipids are invalid.   HDL 34.40 (L) 05/05/2022 1220   CHOLHDL 7 05/05/2022 1220   VLDL 39.4 10/31/2021 1129   LDLCALC 180 (H) 10/31/2021 1129   LDLDIRECT 144.0 05/05/2022 1220    Physical Exam:    VS:  LMP  (LMP Unknown)  , BMI There is no height or weight on file to calculate BMI. GENERAL:  Well appearing HEENT: Pupils equal round and reactive, fundi not visualized, oral mucosa unremarkable NECK:  No jugular venous distention, waveform within normal limits, carotid upstroke brisk and symmetric, no bruits, no thyromegaly LUNGS:  Clear to auscultation bilaterally HEART:  RRR.  PMI not displaced or sustained,S1 and S2 within normal limits, no S3, no S4, no clicks, no rubs, no murmurs ABD:  Flat, positive bowel sounds normal in frequency in pitch, no bruits, no rebound, no guarding, no midline pulsatile mass, no hepatomegaly, no splenomegaly EXT:  2 plus pulses throughout, no edema, no cyanosis no clubbing SKIN:  No rashes no nodules NEURO:  Cranial nerves II through XII grossly intact, motor grossly intact throughout PSYCH:  Cognitively intact, oriented to person place and time   ASSESSMENT:    No diagnosis found.   PLAN:    No problem-specific Assessment & Plan notes found for this encounter.    Disposition:  FU with APP in 4 months.    Medication Adjustments/Labs and Tests Ordered: Current medicines are reviewed at length with the patient today.  Concerns regarding  medicines are outlined above.   No orders of the defined types were placed in this encounter.  No orders of the defined types were placed in this encounter.  I,Mathew Stumpf,acting as a Neurosurgeon for Chilton Si, MD.,have documented all relevant documentation on the behalf of Chilton Si, MD,as directed by  Chilton Si, MD while in the presence of Chilton Si, MD.  I, Chelse Matas C. Duke Salvia, MD have reviewed all documentation for this visit.  The documentation of the exam, diagnosis, procedures, and orders on 10/07/2022 are all accurate and complete.   Signed, Chilton Si, MD  10/07/2022 7:53 AM    [facility]

## 2022-10-07 NOTE — Patient Instructions (Addendum)
Medication Instructions:  Your physician recommends that you continue on your current medications as directed. Please refer to the Current Medication list given to you today.   Labwork: FASTING LP/CMET/THYROID LABS SOON   Testing/Procedures: NONE  Follow-Up: 04/08/2022 10:30 AM WITH DR [facility]    If you need a refill on your cardiac medications before your next appointment, please call your pharmacy.

## 2022-10-07 NOTE — Progress Notes (Signed)
Advanced Hypertension Clinic Follow up Assessment:    Date:  10/07/2022   ID:  Kristen [name], DOB March 13, 1975, MRN [id number]  PCP:  Bradd Canary, MD  Cardiologist:  None  Nephrologist:  Referring MD: Bradd Canary, MD   CC: Hypertension  History of Present Illness:    Kristen [name] is a 47 y.o. female with a hx of TIA, Hashimoto's thyroiditis, hypertension and hyperlipidemia here for follow-up. She initially established care in the hypertension clinic 08/11/2019. She struggled with low BP for years.  Then her BP spiked 09/2013.  She had a TIA and saw Dr. Allyson Sabal.  She continued to have intermittent spiking of her BP.  She struggled with several medications and finally went on Bystolic.  Her BP was well-controlled but she gained weight and had heaviness in her L arm.  She stopped it and felt much better. Her BP started to be higher after a stomach bug and tick bite.  She was treated with enalapril and the dose was increased.  Initially it worked well but stopped being as effective.  She had to start back on Bystolic but is gaining weight, fatigued, and has heaviness in her chest.  Her blood pressure is controlled but the symptoms are manageable.  She has not had any orthopnea or PND but does sometimes have edema that she attributes to enalapril.  Overall her diet has been good. Her mom had heart disease at a young age so she is used to eating a healthy diet.  Lean meat and limited salt intake.  She cooks at home.  She doesn't drink caffeine or EtOH.  She takes magnesium, B12 injections, vitamin ADEK.  She has been taking Adderall since 8th grade.  She stopped taking it for a few days and her BP was still high.  She has a history of hypertriglyceridemia since she was in middle school.  She has taken fish oil without much change in her numbers.  She is also taking Krill oil.  What helped most was drinking warm water with lemon.  She is averse to trying statins because many family members have had muscle  aches.  Of note, her father had a heart attack in his 52s.  Her brother had 2 heart attacks in his 37s.  Her mother has heart rhythm disorders.  At her initial appointment she was started on amlodipine. She stopped enalapril and Bystolic. Since then she saw our pharmacist and increased spironolactone 12/2019.  She was referred to the prep program through the [facility]. Her blood pressure was still elevated so she was started on hydralazine. She took one dose and it caused bladder issues, so she was switched to doxasozin. She also stopped spironolactone due to leg cramps. She saw our pharmacist on 02/2020 and switched from enalapril to olmesartan. She would not start carvedilol because it caused fatigue for her mother. She stopped olmesartan before follow-up with our pharmacist.  She was started on a low dose of carvedilol. She was also encouraged to follow-up with the sleep team and start a CPAP and saw improvement in her BP.  At the last visit her blood pressure remained uncontrolled.  We discussed trying clonidine but she was hesitant.  She started clonidine 07/2021 with her PCP.  At her follow-up visit 10/2021 she reported some intermittent chest pain at night that was sharp.     The patient has a history of Hashimoto's thyroiditis, with a recent thyroid panel showing a normal TSH but an abnormal  T4. She has had an ultrasound for her thyroid and has a renal ultrasound scheduled due to a long-standing cyst on her kidney. She has previously been tested for pheochromocytoma with catecholamines and metanephrines lab tests, which were negative.  Blood pressures have remained uncontrolled.  Clonidine has been reduced due to fatigue.  At her visit 04/2022 blood pressure remained uncontrolled.  We discussed renal denervation but it has not yet been performed.  CT of the abdomen was negative for renal artery stenosis and no adenomas were noted.  Today, she states she is generally feeling well. Since starting semaglutide  from [facility], she reports that many of her prior symptoms including bladder issues and inflammation have resolved. Her energy levels have improved and she is able to exercise more routinely. She has lost about 20 lbs. In the office her blood pressure is 128/86 initially, and 132/84 on manual recheck. At home her readings have been similarly controlled. She is taking clonidine twice daily in the morning and evening, with an extra dose as needed. She may need to take an extra dose one day out of a typical week. She states that she always experiences a stiffness in her neck associated with higher blood pressures. She hasn't needed to use the bystolic for blood pressure >160 mmHg. She denies any palpitations, chest pain, shortness of breath, peripheral edema, lightheadedness, headaches, syncope, orthopnea, or PND.  Previous antihypertensives: HCTZ - AKI, bladder issues, and kidney stones Losartan-didn't work Bystolic- fatigue, arm heaviness Enalapril- swelling at higher dose, fatigue. Amlodipine - urine retention, edema, leg tighten, elevated HR and BP Hydralazine - urinary retention Spironolactone - muscle cramping Doxazosin - would not take due to side effects listed in PI (weight gain, fatigue)    Past Medical History:  Diagnosis Date   Abnormal cervical cytology 09/26/2011   LGSIL in 2011, patient reports repeat pap was normal, has had intermittent abnormals with bx in past but always normalized, has had cryotherapy with good results Menarche at 17, irregular without birth control at times, very heavy LMP 08/26/2011 No gyn concerns, MGM last year normal    ADD (attention deficit disorder)    Allergy    seasonal   Anemia    hx of none recent per pt on 12-27-2020   Asthma    mild, intermittent   Chicken pox as a child   Chronic kidney disease    Claustrophobia 12/27/2020   Concussion 09/24/2010   DJD (degenerative joint disease)    lower back   Family history of heart disease    Female  bladder prolapse, acquired 09/24/2010   Fibromyalgia    Genital herpes 12/27/2020   GERD (gastroesophageal reflux disease)    Headache 09/26/2013   History of concussion 09/24/2010   headaches  x 8 months  and  memeory problems for months no current residual from   History of kidney stones    History of migraines 09/24/2010   History of postpartum depression 2002   HSV-2 infection 09/24/2010   HTN (hypertension) 09/24/2010   Hyperlipidemia    Hypertension    Hypertensive urgency 09/24/2010   hypertension   Hypertriglyceridemia 08/11/2019   Hypothyroidism    Interstitial cystitis 02/18/2011   Left shoulder pain 05/27/2016   Migraine 09/24/2010   Migraine aura, persistent, with cerebral infarct, status over 72 hours 09/24/2010   Myalgia 06/08/2018   Neck pain, musculoskeletal 12/16/2010   oa  and djd no limited neck motion   OSA (obstructive sleep apnea) 08/28/2020   using auto  set 6 to 17   Overweight 05/29/2015   Palpitations 11/20/2021   Pharyngitis    sore throat and hurts when yawns since nov t & s surgery is improving   Pneumonia    last time 2012   Poor concentration 12/16/2010   Recurrent UTI    last uti 1st of december   Self-catheterizes urinary bladder 12/27/2020   prn   Sleep apnea    Stroke (HCC) 2015   Subacute and chronic vaginitis 09/24/2010   TIA (transient ischemic attack) 2015   saw dr berry none since   Tick bite 12/04/2013   rocky mountain spotted fever x 3 months last time 2015   Urinary incontinence    weras pads   Vaginitis and vulvovaginitis 05/21/2014   Wears glasses    or contacts    Past Surgical History:  Procedure Laterality Date   ABDOMINAL SACROCOLPOPEXY N/A 12/31/2020   Procedure: ABDOMINO SACROCOLPOPEXY;  Surgeon: Marguerita Beards, MD;  Location: [facility];  Service: Gynecology;  Laterality: N/A;   ANTERIOR AND POSTERIOR REPAIR  2003   colonscopy  2017   polyps removed due now   CYSTOSCOPY N/A 12/31/2020    Procedure: CYSTOSCOPY;  Surgeon: Marguerita Beards, MD;  Location: [facility];  Service: Gynecology;  Laterality: N/A;   INCONTINENCE SURGERY  01/13/2001   ROBOTIC ASSISTED TOTAL HYSTERECTOMY N/A 12/31/2020   Procedure: XI ROBOTIC ASSISTED TOTAL HYSTERECTOMY with bilateral salpingectomy;  Surgeon: Marguerita Beards, MD;  Location: [facility];  Service: Gynecology;  Laterality: N/A;   TONSILLECTOMY  11/15/2020   and adenoids removed due to constant infections at [facility] [place]    Current Medications: Current Meds  Medication Sig   acyclovir (ZOVIRAX) 200 MG capsule Take 1 capsule (200 mg total) by mouth 2 (two) times daily.   albuterol (VENTOLIN HFA) 108 (90 Base) MCG/ACT inhaler Inhale 2 puffs into the lungs every 6 (six) hours as needed.   ALPRAZolam (XANAX) 0.5 MG tablet 1 tablet by mouth 30 minutes prior to flight.   amphetamine-dextroamphetamine (ADDERALL) 20 MG tablet Take 1 tablet (20 mg total) by mouth 2 (two) times daily. August 2024   budesonide-formoterol (SYMBICORT) 160-4.5 MCG/ACT inhaler TAKE 2 PUFFS BY MOUTH TWICE A DAY   cloNIDine (CATAPRES) 0.1 MG tablet TAKE 2 TABLETS(0.2 MG) BY MOUTH THREE TIMES DAILY   Cyanocobalamin (VITAMIN B 12 PO) Take by mouth. Vitamin b 12 subligual daily   dicyclomine (BENTYL) 10 MG capsule Take 1 capsule (10 mg total) by mouth every 6 (six) hours as needed (abdominal pain).   EPINEPHrine 0.3 mg/0.3 mL IJ SOAJ injection Inject 0.3 mg into the muscle as needed for anaphylaxis.   estradiol (VIVELLE-DOT) 0.025 MG/24HR Place 1 patch onto the skin 2 (two) times a week.   fluconazole (DIFLUCAN) 150 MG tablet Take 1 tablet (150 mg total) by mouth once a week. Takes prn   HYDROcodone-acetaminophen (NORCO/VICODIN) 5-325 MG tablet Take 1 tablet by mouth every 6 (six) hours as needed for moderate pain.   hydrOXYzine (ATARAX) 50 MG tablet Take 1 tablet (50 mg total) by mouth every 6 (six) hours as needed  for anxiety.   lidocaine (XYLOCAINE) 5 % ointment Apply 1 Application topically daily as needed.   liothyronine (CYTOMEL) 5 MCG tablet TAKE 1 TABLET BY MOUTH EVERY DAY   MAGNESIUM PO Take by mouth. Otc daily   meloxicam (MOBIC) 7.5 MG tablet TAKE 1 TABLET(7.5 MG) BY MOUTH DAILY   methylPREDNISolone (MEDROL  DOSEPAK) 4 MG TBPK tablet 5 tabs po x 1 day then 4 tabs po x 1 day then 3 tabs po x 1 day then 2 tabs po x 1 day then 1 tab po x 1 day and stop   Multiple Vitamins-Minerals (MULTIVITAMIN WITH MINERALS) tablet Take 1 tablet by mouth daily.   Naltrexone HCl, Pain, 1.5 MG CAPS Take 2-3 tablets by mouth daily.   nebivolol (BYSTOLIC) 5 MG tablet 1 tablet by mouth once daily as needed for systolic blood pressure >160.   neomycin-polymyxin b-dexamethasone (MAXITROL) 3.5-10000-0.1 SUSP Place 1 drop into both eyes in the morning, at noon, in the evening, and at bedtime.   nitrofurantoin, macrocrystal-monohydrate, (MACROBID) 100 MG capsule TAKE 1 CAPSULE FOR 1 DOSE AS NEEDED FOR PREVENTION OF UTI *AFTER SELF CATH, SWIMMING, HOT TUB, SEX*   ondansetron (ZOFRAN) 4 MG tablet Take 1 tablet (4 mg total) by mouth every 8 (eight) hours as needed for nausea or vomiting.   SYNTHROID 137 MCG tablet TAKE 1 TABLET(137 MCG) BY MOUTH DAILY   tobramycin-dexamethasone (TOBRADEX) ophthalmic solution Place 1 drop into both eyes every 4 (four) hours while awake.   Current Facility-Administered Medications for the 10/07/22 encounter (Office Visit) with Chilton Si, MD  Medication   sodium chloride flush (NS) 0.9 % injection 3 mL     Allergies:   Chocolate, Peanut-containing drug products, Clindamycin/lincomycin, Estradiol, Influenza vaccines, Bystolic [nebivolol hcl], Doxazosin, Enalapril, Hydralazine, Hydrochlorothiazide, Losartan, Spiractone [spironolactone], Terazol [terconazole], and Latex   Social History   Socioeconomic History   Marital status: Married    Spouse name: Not on file   Number of children: Not  on file   Years of education: Not on file   Highest education level: Associate degree: occupational, Scientist, product/process development, or vocational program  Occupational History   Not on file  Tobacco Use   Smoking status: Never   Smokeless tobacco: Never  Vaping Use   Vaping status: Never Used  Substance and Sexual Activity   Alcohol use: No    Alcohol/week: 0.0 standard drinks of alcohol    Comment: 0   Drug use: No   Sexual activity: Yes    Partners: Male    Birth control/protection: Pill  Other Topics Concern   Not on file  Social History Narrative   Lives with husband 2 children in a one story home.     Works as a Teacher, adult education.     Education: college.   Social Determinants of Health   Financial Resource Strain: High Risk (05/05/2022)   Overall Financial Resource Strain (CARDIA)    Difficulty of Paying Living Expenses: Hard  Food Insecurity: No Food Insecurity (05/05/2022)   Hunger Vital Sign    Worried About Running Out of Food in the Last Year: Never true    Ran Out of Food in the Last Year: Never true  Transportation Needs: No Transportation Needs (05/05/2022)   PRAPARE - Administrator, Civil Service (Medical): No    Lack of Transportation (Non-Medical): No  Physical Activity: Sufficiently Active (05/05/2022)   Exercise Vital Sign    Days of Exercise per Week: 5 days    Minutes of Exercise per Session: 40 min  Stress: Stress Concern Present (05/05/2022)   [employer] of Occupational Health - Occupational Stress Questionnaire    Feeling of Stress : Very much  Social Connections: Moderately Isolated (05/05/2022)   Social Connection and Isolation Panel [NHANES]    Frequency of Communication with Friends and Family: Patient declined  Frequency of Social Gatherings with Friends and Family: More than three times a week    Attends Religious Services: Never    Database administrator or Organizations: No    Attends Engineer, structural: Not on file    Marital  Status: Married     Family History: The patient's family history includes Alcohol abuse in her father and paternal grandfather; Crohn's disease in her maternal aunt; Dementia (age of onset: 44) in her maternal grandfather; Heart attack in her father and paternal grandfather; Heart attack (age of onset: 58) in her brother; Heart disease in her father, maternal grandmother, and paternal grandfather; Heart disease (age of onset: 23) in her brother; Heart failure in her maternal grandfather and maternal grandmother; Hyperlipidemia in her brother, father, mother, and paternal grandfather; Hypertension in her brother, father, and paternal grandfather; Kidney disease in her father; Osteoporosis in her mother; Stroke in her maternal grandfather and paternal grandfather; Valvular heart disease in her maternal grandmother. There is no history of Colon cancer, Stomach cancer, Esophageal cancer, Pancreatic cancer, or Rectal cancer.  ROS:   Please see the history of present illness.  All other systems reviewed and are negative.  EKGs/Labs/Other Studies Reviewed:    CTA Abdomen  06/12/2022: IMPRESSION: 1. No acute vascular or nonvascular abnormality within the abdomen. 2. Patent, normal-appearing renal arteries without CTA evidence of renal artery stenosis.  Bilateral Renal Artery Doppler  11/25/2021: Summary:  Renal:    Right: 1-59% stenosis of the right renal artery. RRV flow present.  Left:  Cyst(s) noted. 1-59% stenosis of the left renal artery. LRV         flow present.   Coronary Calcium Score 08/30/2019: FINDINGS: Non-cardiac: See separate report from [facility]. Ascending Aorta: Pericardium: Normal Coronary arteries: No coronary calcification. IMPRESSION: Coronary calcium score of 0. This was 0 percentile for age and sex matched control.  EKG:   EKG is personally reviewed. 10/07/2022:  Not ordered. 05/12/2022:  EKG was not ordered. 08/28/2020: Sinus rhythm. Rate 96  bpm. 08/11/2019: sinus rhythm.  Rate 88 bpm.  Recent Labs: 05/05/2022: ALT 20; Hemoglobin 14.6; Platelets 190.0; TSH 0.06 06/06/2022: BUN 16; Creatinine, Ser 0.67; Potassium 4.6; Sodium 140   Recent Lipid Panel    Component Value Date/Time   CHOL 236 (H) 05/05/2022 1220   TRIG (H) 05/05/2022 1220    419.0 Triglyceride is over 400; calculations on Lipids are invalid.   HDL 34.40 (L) 05/05/2022 1220   CHOLHDL 7 05/05/2022 1220   VLDL 39.4 10/31/2021 1129   LDLCALC 180 (H) 10/31/2021 1129   LDLDIRECT 144.0 05/05/2022 1220    Physical Exam:    VS:  BP 132/84 (BP Location: Right Arm, Patient Position: Sitting, Cuff Size: Normal)   Pulse 87   Ht 5\' 2"  (1.575 m)   Wt 142 lb 9.6 oz (64.7 kg)   LMP  (LMP Unknown)   SpO2 100%   BMI 26.08 kg/m  , BMI Body mass index is 26.08 kg/m. GENERAL:  Well appearing HEENT: Pupils equal round and reactive, fundi not visualized, oral mucosa unremarkable NECK:  No jugular venous distention, waveform within normal limits, carotid upstroke brisk and symmetric, no bruits, no thyromegaly LUNGS:  Clear to auscultation bilaterally HEART:  RRR.  PMI not displaced or sustained,S1 and S2 within normal limits, no S3, no S4, no clicks, no rubs, no murmurs ABD:  Flat, positive bowel sounds normal in frequency in pitch, no bruits, no rebound, no guarding, no midline pulsatile mass, no  hepatomegaly, no splenomegaly EXT:  2 plus pulses throughout, no edema, no cyanosis no clubbing SKIN:  No rashes no nodules NEURO:  Cranial nerves II through XII grossly intact, motor grossly intact throughout PSYCH:  Cognitively intact, oriented to person place and time   ASSESSMENT:    1. Essential hypertension   2. OSA (obstructive sleep apnea)   3. Mixed hyperlipidemia   4. Hashimoto's thyroiditis      PLAN:    # Hypertension Well controlled with current regimen. Patient reports occasional need for additional midday dose. -Continue clonidine.  Rarely needs  bisoprolol.  # Interstitial Cystitis Significant improvement with semaglutide. -Continue semaglutide.  # Weight Management Significant weight loss noted with semaglutide. -Continue semaglutide. -Encouraged continued exercise  # Hashimoto's Thyroiditis Patient interested in monitoring disease progression. -Order TPO antibody test for use by her endocrine team.  Check TSH and free T4.  # General Health Maintenance -Order fasting lipid panel and comprehensive metabolic panel. -Schedule follow-up appointment in six months.        Disposition:  FU with APP in 6 months.    Medication Adjustments/Labs and Tests Ordered: Current medicines are reviewed at length with the patient today.  Concerns regarding medicines are outlined above.   Orders Placed This Encounter  Procedures   Anti-TPO Ab (RDL)   T4, free   TSH   Lipid panel   Comprehensive metabolic panel   No orders of the defined types were placed in this encounter.  I,Mathew Stumpf,acting as a Neurosurgeon for Chilton Si, MD.,have documented all relevant documentation on the behalf of Chilton Si, MD,as directed by  Chilton Si, MD while in the presence of Chilton Si, MD.  I, Gladine Plude C. Duke Salvia, MD have reviewed all documentation for this visit.  The documentation of the exam, diagnosis, procedures, and orders on 10/07/2022 are all accurate and complete.  Signed, Chilton Si, MD  10/07/2022 1:32 PM    [facility]

## 2022-10-16 DIAGNOSIS — E782 Mixed hyperlipidemia: Secondary | ICD-10-CM | POA: Diagnosis not present

## 2022-10-16 DIAGNOSIS — I10 Essential (primary) hypertension: Secondary | ICD-10-CM | POA: Diagnosis not present

## 2022-10-16 DIAGNOSIS — E063 Autoimmune thyroiditis: Secondary | ICD-10-CM | POA: Diagnosis not present

## 2022-10-28 LAB — LIPID PANEL
Chol/HDL Ratio: 5.4 {ratio} — ABNORMAL HIGH (ref 0.0–4.4)
Cholesterol, Total: 216 mg/dL — ABNORMAL HIGH (ref 100–199)
HDL: 40 mg/dL (ref >39)
LDL Chol Calc (NIH): 148 mg/dL — ABNORMAL HIGH (ref 0–99)
Triglycerides: 154 mg/dL — ABNORMAL HIGH (ref 0–149)
VLDL Cholesterol Cal: 28 mg/dL (ref 5–40)

## 2022-10-28 LAB — COMPREHENSIVE METABOLIC PANEL
ALT: 17 [IU]/L (ref 0–32)
AST: 20 [IU]/L (ref 0–40)
Albumin: 4.3 g/dL (ref 3.9–4.9)
Alkaline Phosphatase: 67 [IU]/L (ref 44–121)
BUN/Creatinine Ratio: 13 (ref 9–23)
BUN: 10 mg/dL (ref 6–24)
Bilirubin Total: 0.7 mg/dL (ref 0.0–1.2)
CO2: 23 mmol/L (ref 20–29)
Calcium: 9.2 mg/dL (ref 8.7–10.2)
Chloride: 103 mmol/L (ref 96–106)
Creatinine, Ser: 0.77 mg/dL (ref 0.57–1.00)
Globulin, Total: 1.7 g/dL (ref 1.5–4.5)
Glucose: 87 mg/dL (ref 70–99)
Potassium: 4.2 mmol/L (ref 3.5–5.2)
Sodium: 139 mmol/L (ref 134–144)
Total Protein: 6 g/dL (ref 6.0–8.5)
eGFR: 96 mL/min/{1.73_m2} (ref >59)

## 2022-10-28 LAB — T4, FREE: Free T4: 1.66 ng/dL (ref 0.82–1.77)

## 2022-10-28 LAB — ANTI-TPO AB (RDL): Anti-TPO Ab (RDL): 98.6 [IU]/mL — ABNORMAL HIGH (ref <9.0)

## 2022-10-28 LAB — TSH: TSH: 0.029 u[IU]/mL — ABNORMAL LOW (ref 0.450–4.500)

## 2022-10-31 ENCOUNTER — Telehealth (HOSPITAL_BASED_OUTPATIENT_CLINIC_OR_DEPARTMENT_OTHER): Payer: Self-pay

## 2022-10-31 NOTE — Telephone Encounter (Addendum)
Left message for patient to call back    ----- Message from [facility] sent at 10/31/2022 10:27 AM EDT ----- Thyroid labs are more abnormal than when checked in April.  TSH is even lower now and anti-TPO antibiodies are very elevated.  Please arrange to follow up with your endocrinologist soon. Kidney and liver function are abnormal. Recommend that we repeat her calcium score 08/2023 to make sure that she isn't developing signs of coronary artery disease that would make [place] recommend cholesterol medication.  Make sure to exercise at least 150 minutes weekly and limit fried foods, fatty foods and animal products.

## 2022-11-03 ENCOUNTER — Telehealth (HOSPITAL_BASED_OUTPATIENT_CLINIC_OR_DEPARTMENT_OTHER): Payer: Self-pay

## 2022-11-03 DIAGNOSIS — Z0000 Encounter for general adult medical examination without abnormal findings: Secondary | ICD-10-CM

## 2022-11-03 DIAGNOSIS — E782 Mixed hyperlipidemia: Secondary | ICD-10-CM

## 2022-11-03 NOTE — Telephone Encounter (Addendum)
Seen by patient Kristen [name] on 10/31/2022 10:17 PM; follow up mychart message sent to patient. Repeat testing ordered.    ----- Message from Chilton Si sent at 10/31/2022 10:27 AM EDT ----- Thyroid labs are [name] abnormal than when checked in April.  TSH is even lower now and anti-TPO antibiodies are very elevated.  Please arrange to follow up with your endocrinologist soon. Kidney and liver function are abnormal. Recommend that we repeat her calcium score 08/2023 to make sure that she isn't developing signs of coronary artery disease that would make [place] recommend cholesterol medication.  Make sure to exercise at least 150 minutes weekly and limit fried foods, fatty foods and animal products.

## 2022-11-09 NOTE — Assessment & Plan Note (Signed)
Supplement and monitor 

## 2022-11-09 NOTE — Assessment & Plan Note (Signed)
Encourage heart healthy diet such as MIND or DASH diet, increase exercise, avoid trans fats, simple carbohydrates and processed foods, consider a krill or fish or flaxseed oil cap daily.  °

## 2022-11-09 NOTE — Assessment & Plan Note (Signed)
Well controlled, no changes to meds. Encouraged heart healthy diet such as the DASH diet and exercise as tolerated.  °

## 2022-11-09 NOTE — Assessment & Plan Note (Signed)
On Levothyroxine and Cytomel, continue to monitor TSH suppressed

## 2022-11-10 ENCOUNTER — Ambulatory Visit (INDEPENDENT_AMBULATORY_CARE_PROVIDER_SITE_OTHER): Payer: BC Managed Care – PPO | Admitting: Family Medicine

## 2022-11-10 VITALS — BP 132/95 | HR 78 | Temp 97.8°F | Resp 18 | Ht 62.0 in | Wt 141.0 lb

## 2022-11-10 DIAGNOSIS — R739 Hyperglycemia, unspecified: Secondary | ICD-10-CM | POA: Diagnosis not present

## 2022-11-10 DIAGNOSIS — E538 Deficiency of other specified B group vitamins: Secondary | ICD-10-CM | POA: Diagnosis not present

## 2022-11-10 DIAGNOSIS — E559 Vitamin D deficiency, unspecified: Secondary | ICD-10-CM

## 2022-11-10 DIAGNOSIS — I10 Essential (primary) hypertension: Secondary | ICD-10-CM

## 2022-11-10 DIAGNOSIS — E782 Mixed hyperlipidemia: Secondary | ICD-10-CM

## 2022-11-10 DIAGNOSIS — E039 Hypothyroidism, unspecified: Secondary | ICD-10-CM

## 2022-11-10 LAB — VITAMIN D 25 HYDROXY (VIT D DEFICIENCY, FRACTURES): VITD: 67.07 ng/mL (ref 30.00–100.00)

## 2022-11-10 LAB — HEMOGLOBIN A1C: Hgb A1c MFr Bld: 5.5 % (ref 4.6–6.5)

## 2022-11-10 LAB — VITAMIN B12: Vitamin B-12: >1537 pg/mL — ABNORMAL HIGH (ref 211–911)

## 2022-11-10 MED ORDER — ESTRADIOL 0.0375 MG/24HR TD PTTW: 1.0000 | MEDICATED_PATCH | TRANSDERMAL | 5 refills | Status: DC

## 2022-11-10 NOTE — Progress Notes (Signed)
Subjective:    Patient ID: Kristen [name], female    DOB: 1975-01-15, 47 y.o.   MRN: [id number]  Chief Complaint  Patient presents with  . Follow-up    HPI Discussed the use of AI scribe software for clinical note transcription with the patient, who gave verbal consent to proceed.  History of Present Illness   The patient, with a history of menopause and thyroid disorder, presents with persistent hot flashes, dry skin, and changes in breast tissue despite being on a low dose estrogen patch. The patient reports that the frequency of hot flashes has decreased significantly since starting the estrogen patch, but they have not completely resolved. The patient also reports that her skin remains dry and her breasts have not returned to their normal state.  In addition to menopausal symptoms, the patient is also dealing with thyroid issues. The patient's TSH is low and her T4 has been fluctuating. The patient is also on semaglutide for weight loss, which has significantly improved her triglycerides.  The patient is considering adding progesterone to her treatment regimen and is also concerned about potential adrenal issues. The patient is interested in exploring all possible treatment options to improve her quality of life.        Past Medical History:  Diagnosis Date  . Abnormal cervical cytology 09/26/2011   LGSIL in 2011, patient reports repeat pap was normal, has had intermittent abnormals with bx in past but always normalized, has had cryotherapy with good results Menarche at 17, irregular without birth control at times, very heavy LMP 08/26/2011 No gyn concerns, MGM last year normal   . ADD (attention deficit disorder)   . Allergy    seasonal  . Anemia    hx of none recent per pt on 12-27-2020  . Asthma    mild, intermittent  . Chicken pox as a child  . Chronic kidney disease   . Claustrophobia 12/27/2020  . Concussion 09/24/2010  . DJD (degenerative joint disease)    lower back  .  Family history of heart disease   . Female bladder prolapse, acquired 09/24/2010  . Fibromyalgia   . Genital herpes 12/27/2020  . GERD (gastroesophageal reflux disease)   . Headache 09/26/2013  . History of concussion 09/24/2010   headaches  x 8 months  and  memeory problems for months no current residual from  . History of kidney stones   . History of migraines 09/24/2010  . History of postpartum depression 2002  . 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 HTN (hypertension) 09/24/2010  . Hyperlipidemia   . Hypertension   . Hypertensive urgency 09/24/2010   hypertension  . Hypertriglyceridemia 08/11/2019  . Hypothyroidism   . Interstitial cystitis 02/18/2011  . Left shoulder pain 05/27/2016  . Migraine 09/24/2010  . Migraine aura, persistent, with cerebral infarct, status over 72 hours 09/24/2010  . Myalgia 06/08/2018  . Neck pain, musculoskeletal 12/16/2010   oa  and djd no limited neck motion  . OSA (obstructive sleep apnea) 08/28/2020   using auto set 6 to 17  . Overweight 05/29/2015  . Palpitations 11/20/2021  . Pharyngitis    sore throat and hurts when yawns since nov t & s surgery is improving  . Pneumonia    last time 2012  . Poor concentration 12/16/2010  . Recurrent UTI    last uti 1st of december  . Self-catheterizes urinary bladder 12/27/2020   prn  . Sleep apnea   . Stroke (HCC) 2015  . Subacute and  chronic vaginitis 09/24/2010  . TIA (transient ischemic attack) 2015   saw dr berry none since  . Tick bite 12/04/2013   rocky mountain spotted fever x 3 months last time 2015  . Urinary incontinence    weras pads  . Vaginitis and vulvovaginitis 05/21/2014  . Wears glasses    or contacts    Past Surgical History:  Procedure Laterality Date  . ABDOMINAL SACROCOLPOPEXY N/A 12/31/2020   Procedure: ABDOMINO SACROCOLPOPEXY;  Surgeon: Marguerita Beards, MD;  Location: [facility];  Service: Gynecology;  Laterality: N/A;  . ANTERIOR AND POSTERIOR  REPAIR  2003  . colonscopy  2017   polyps removed due now  . CYSTOSCOPY N/A 12/31/2020   Procedure: CYSTOSCOPY;  Surgeon: Marguerita Beards, MD;  Location[facility]  Service: Gynecology;  Laterality: N/A;  . INCONTINENCE SURGERY  01/13/2001  . ROBOTIC ASSISTED TOTAL HYSTERECTOMY N/A 12/31/2020   Procedure: XI ROBOTIC ASSISTED TOTAL HYSTERECTOMY with bilateral salpingectomy;  Surgeon: Marguerita Beards, MD;  Location: [facility];  Service: Gynecology;  Laterality: N/A;  . TONSILLECTOMY  11/15/2020   and adenoids removed due to constant infections at [facility] [place]    Family History  Problem Relation Age of Onset  . Osteoporosis Mother   . Hyperlipidemia Mother   . Kidney disease Father   . Heart attack Father   . Hyperlipidemia Father   . Hypertension Father   . Alcohol abuse Father   . Heart disease Father   . Heart attack Brother 45  . Hypertension Brother   . Heart disease Brother 45       MI  . Hyperlipidemia Brother   . Crohn's disease Maternal Aunt   . Heart disease Maternal Grandmother   . Heart failure Maternal Grandmother   . Valvular heart disease Maternal Grandmother   . Dementia Maternal Grandfather 97       early stages  . Stroke Maternal Grandfather   . Heart failure Maternal Grandfather   . Heart attack Paternal Grandfather   . Stroke Paternal Grandfather   . Heart disease Paternal Grandfather   . Alcohol abuse Paternal Grandfather   . Hyperlipidemia Paternal Grandfather   . Hypertension Paternal Grandfather   . Colon cancer Neg Hx   . Stomach cancer Neg Hx   . Esophageal cancer Neg Hx   . Pancreatic cancer Neg Hx   . Rectal cancer Neg Hx     Social History   Socioeconomic History  . Marital status: Married    Spouse name: Not on file  . Number of children: Not on file  . Years of education: Not on file  . Highest education level: Associate degree: occupational, Scientist, product/process development, or vocational  program  Occupational History  . Not on file  Tobacco Use  . Smoking status: Never  . Smokeless tobacco: Never  Vaping Use  . Vaping status: Never Used  Substance and Sexual Activity  . Alcohol use: No    Alcohol/week: 0.0 standard drinks of alcohol    Comment: 0  . Drug use: No  . Sexual activity: Yes    Partners: Male    Birth control/protection: Pill  Other Topics Concern  . Not on file  Social History Narrative   Lives with husband 2 children in a one story home.     Works as a Teacher, adult education.     Education: college.   Social Determinants of Health   Financial Resource Strain: Medium Risk (11/10/2022)  Overall Physicist, medical Strain (CARDIA)   . Difficulty of Paying Living Expenses: Somewhat hard  Food Insecurity: No Food Insecurity (11/10/2022)   Hunger Vital Sign   . Worried About Programme researcher, broadcasting/film/video in the Last Year: Never true   . Ran Out of Food in the Last Year: Never true  Transportation Needs: No Transportation Needs (11/10/2022)   PRAPARE - Transportation   . Lack of Transportation (Medical): No   . Lack of Transportation (Non-Medical): No  Physical Activity: Sufficiently Active (11/10/2022)   Exercise Vital Sign   . Days of Exercise per Week: 4 days   . Minutes of Exercise per Session: 50 min  Stress: Stress Concern Present (11/10/2022)   [employer] of Occupational Health - Occupational Stress Questionnaire   . Feeling of Stress : To some extent  Social Connections: Moderately Isolated (11/10/2022)   Social Connection and Isolation Panel [NHANES]   . Frequency of Communication with Friends and Family: More than three times a week   . Frequency of Social Gatherings with Friends and Family: More than three times a week   . Attends Religious Services: Never   . Active Member of Clubs or Organizations: No   . Attends Banker Meetings: Not on file   . Marital Status: Married  Catering manager Violence: Unknown (04/17/2021)    [facility]   HITS   . Physically Hurt: Not on file   . Insult or Talk Down To: Not on file   . Threaten Physical Harm: Not on file   . Scream or Curse: Not on file    Outpatient Medications Prior to Visit  Medication Sig Dispense Refill  . acyclovir (ZOVIRAX) 200 MG capsule Take 1 capsule (200 mg total) by mouth 2 (two) times daily. 60 capsule 5  . albuterol (VENTOLIN HFA) 108 (90 Base) MCG/ACT inhaler Inhale 2 puffs into the lungs every 6 (six) hours as needed. 1 each 3  . ALPRAZolam (XANAX) 0.5 MG tablet 1 tablet by mouth 30 minutes prior to flight. 4 tablet 0  . amphetamine-dextroamphetamine (ADDERALL) 20 MG tablet Take 1 tablet (20 mg total) by mouth 2 (two) times daily. August 2024 60 tablet 0  . budesonide-formoterol (SYMBICORT) 160-4.5 MCG/ACT inhaler TAKE 2 PUFFS BY MOUTH TWICE A DAY 10.2 each 5  . cloNIDine (CATAPRES) 0.1 MG tablet TAKE 2 TABLETS(0.2 MG) BY MOUTH THREE TIMES DAILY 180 tablet 3  . Cyanocobalamin (VITAMIN B 12 PO) Take by mouth. Vitamin b 12 subligual daily    . dicyclomine (BENTYL) 10 MG capsule Take 1 capsule (10 mg total) by mouth every 6 (six) hours as needed (abdominal pain). 90 capsule 0  . EPINEPHrine 0.3 mg/0.3 mL IJ SOAJ injection Inject 0.3 mg into the muscle as needed for anaphylaxis. 2 each 2  . fluconazole (DIFLUCAN) 150 MG tablet Take 1 tablet (150 mg total) by mouth once a week. Takes prn 2 tablet 2  . HYDROcodone-acetaminophen (NORCO/VICODIN) 5-325 MG tablet Take 1 tablet by mouth every 6 (six) hours as needed for moderate pain. 90 tablet 0  . hydrOXYzine (ATARAX) 50 MG tablet Take 1 tablet (50 mg total) by mouth every 6 (six) hours as needed for anxiety. 5 tablet 0  . lidocaine (XYLOCAINE) 5 % ointment Apply 1 Application topically daily as needed. 50 g 1  . liothyronine (CYTOMEL) 5 MCG tablet TAKE 1 TABLET BY MOUTH EVERY DAY 90 tablet 1  . MAGNESIUM PO Take by mouth. Otc daily    .  meloxicam (MOBIC) 7.5 MG tablet TAKE 1 TABLET(7.5  MG) BY MOUTH DAILY 14 tablet 0  . methylPREDNISolone (MEDROL DOSEPAK) 4 MG TBPK tablet 5 tabs po x 1 day then 4 tabs po x 1 day then 3 tabs po x 1 day then 2 tabs po x 1 day then 1 tab po x 1 day and stop 15 tablet 0  . Multiple Vitamins-Minerals (MULTIVITAMIN WITH MINERALS) tablet Take 1 tablet by mouth daily.    . Naltrexone HCl, Pain, 1.5 MG CAPS Take 2-3 tablets by mouth daily. 90 capsule 3  . nebivolol (BYSTOLIC) 5 MG tablet 1 tablet by mouth once daily as needed for systolic blood pressure >160. 30 tablet 3  . neomycin-polymyxin b-dexamethasone (MAXITROL) 3.5-10000-0.1 SUSP Place 1 drop into both eyes in the morning, at noon, in the evening, and at bedtime. 5 mL 1  . nitrofurantoin, macrocrystal-monohydrate, (MACROBID) 100 MG capsule TAKE 1 CAPSULE FOR 1 DOSE AS NEEDED FOR PREVENTION OF UTI *AFTER SELF CATH, SWIMMING, HOT TUB, SEX* 30 capsule 8  . ondansetron (ZOFRAN) 4 MG tablet Take 1 tablet (4 mg total) by mouth every 8 (eight) hours as needed for nausea or vomiting. 10 tablet 0  . SYNTHROID 137 MCG tablet TAKE 1 TABLET(137 MCG) BY MOUTH DAILY 90 tablet 1  . tobramycin-dexamethasone (TOBRADEX) ophthalmic solution Place 1 drop into both eyes every 4 (four) hours while awake. 5 mL 0  . estradiol (VIVELLE-DOT) 0.025 MG/24HR Place 1 patch onto the skin 2 (two) times a week. 8 patch 0   Facility-Administered Medications Prior to Visit  Medication Dose Route Frequency Provider Last Rate Last Admin  . sodium chloride flush (NS) 0.9 % injection 3 mL  3 mL Intravenous Q12H Chilton Si, MD        Allergies  Allergen Reactions  . Chocolate Anaphylaxis    Trouble breathing facial swelling  . Peanut-Containing Drug Products Anaphylaxis    All nuts of every kind  . Clindamycin/Lincomycin Nausea And Vomiting    And diarrhea.   . Estradiol Nausea Only    Anxiety, hot flashes  . Influenza Vaccines Rash    Significant swelling in arm, rash, malaise, myalgias   . Bystolic [Nebivolol Hcl]      Fatigue arm heaviness  . Doxazosin     Would not take due to side effects listed in patient information  . Enalapril     Swelling at higher doses fatigue  . Hydralazine     Urinary retention and edema   . Hydrochlorothiazide     Bladder issues and kidney stones  . Losartan     Did not work  . Spiractone [Spironolactone]     Leg cramps  . Terazol [Terconazole]     Burning sensation  . Latex Rash    Review of Systems  Constitutional:  Negative for fever and malaise/fatigue.  HENT:  Negative for congestion.   Eyes:  Negative for blurred vision.  Respiratory:  Negative for shortness of breath.   Cardiovascular:  Negative for chest pain, palpitations and leg swelling.  Gastrointestinal:  Negative for abdominal pain, blood in stool and nausea.  Genitourinary:  Negative for dysuria and frequency.  Musculoskeletal:  Negative for falls.  Skin:  Negative for rash.  Neurological:  Negative for dizziness, loss of consciousness and headaches.  Endo/Heme/Allergies:  Negative for environmental allergies.  Psychiatric/Behavioral:  Negative for depression. The patient is not nervous/anxious.       Objective:    Physical Exam Constitutional:  General: She is not in acute distress.    Appearance: Normal appearance. She is well-developed. She is not toxic-appearing.  HENT:     Head: Normocephalic and atraumatic.     Right Ear: External ear normal.     Left Ear: External ear normal.     Nose: Nose normal.  Eyes:     General:        Right eye: No discharge.        Left eye: No discharge.     Conjunctiva/sclera: Conjunctivae normal.  Neck:     Thyroid: No thyromegaly.  Cardiovascular:     Rate and Rhythm: Normal rate and regular rhythm.     Heart sounds: Normal heart sounds. No murmur heard. Pulmonary:     Effort: Pulmonary effort is normal. No respiratory distress.     Breath sounds: Normal breath sounds.  Abdominal:     General: Bowel sounds are normal.     Palpations:  Abdomen is soft.     Tenderness: There is no abdominal tenderness. There is no guarding.  Musculoskeletal:        General: Normal range of motion.     Cervical back: Neck supple.  Lymphadenopathy:     Cervical: No cervical adenopathy.  Skin:    General: Skin is warm and dry.  Neurological:     Mental Status: She is alert and oriented to person, place, and time.  Psychiatric:        Mood and Affect: Mood normal.        Behavior: Behavior normal.        Thought Content: Thought content normal.        Judgment: Judgment normal.   BP (!) 132/95 (BP Location: Right Arm, Patient Position: Sitting, Cuff Size: Normal)   Pulse 78   Temp 97.8 F (36.6 C) (Oral)   Resp 18   Ht 5\' 2"  (1.575 m)   Wt 141 lb (64 kg)   LMP  (LMP Unknown)   SpO2 98%   BMI 25.79 kg/m  Wt Readings from Last 3 Encounters:  11/10/22 141 lb (64 kg)  10/07/22 142 lb 9.6 oz (64.7 kg)  05/12/22 163 lb 9.6 oz (74.2 kg)    Diabetic Foot Exam - Simple   No data filed    Lab Results  Component Value Date   WBC 7.6 05/05/2022   HGB 14.6 05/05/2022   HCT 43.2 05/05/2022   PLT 190.0 05/05/2022   GLUCOSE 87 10/16/2022   CHOL 216 (H) 10/16/2022   TRIG 154 (H) 10/16/2022   HDL 40 10/16/2022   LDLDIRECT 144.0 05/05/2022   LDLCALC 148 (H) 10/16/2022   ALT 17 10/16/2022   AST 20 10/16/2022   NA 139 10/16/2022   K 4.2 10/16/2022   CL 103 10/16/2022   CREATININE 0.77 10/16/2022   BUN 10 10/16/2022   CO2 23 10/16/2022   TSH 0.029 (L) 10/16/2022   INR 1.09 05/23/2015   HGBA1C 5.5 11/10/2022    Lab Results  Component Value Date   TSH 0.029 (L) 10/16/2022   Lab Results  Component Value Date   WBC 7.6 05/05/2022   HGB 14.6 05/05/2022   HCT 43.2 05/05/2022   MCV 92.6 05/05/2022   PLT 190.0 05/05/2022   Lab Results  Component Value Date   NA 139 10/16/2022   K 4.2 10/16/2022   CO2 23 10/16/2022   GLUCOSE 87 10/16/2022   BUN 10 10/16/2022   CREATININE 0.77 10/16/2022   BILITOT 0.7 10/16/2022  ALKPHOS 67 10/16/2022   AST 20 10/16/2022   ALT 17 10/16/2022   PROT 6.0 10/16/2022   ALBUMIN 4.3 10/16/2022   CALCIUM 9.2 10/16/2022   ANIONGAP 8 12/27/2020   EGFR 96 10/16/2022   GFR 87.09 05/05/2022   Lab Results  Component Value Date   CHOL 216 (H) 10/16/2022   Lab Results  Component Value Date   HDL 40 10/16/2022   Lab Results  Component Value Date   LDLCALC 148 (H) 10/16/2022   Lab Results  Component Value Date   TRIG 154 (H) 10/16/2022   Lab Results  Component Value Date   CHOLHDL 5.4 (H) 10/16/2022   Lab Results  Component Value Date   HGBA1C 5.5 11/10/2022       Assessment & Plan:  Essential hypertension Assessment & Plan: Well controlled, no changes to meds. Encouraged heart healthy diet such as the DASH diet and exercise as tolerated.     Mixed hyperlipidemia Assessment & Plan: Encourage heart healthy diet such as MIND or DASH diet, increase exercise, avoid trans fats, simple carbohydrates and processed foods, consider a krill or fish or flaxseed oil cap daily.    Hypothyroidism, unspecified type Assessment & Plan: On Levothyroxine and Cytomel, continue to monitor TSH suppressed   Orders: -     Thyroid peroxidase antibody  Vitamin B12 deficiency Assessment & Plan: Supplement and monitor   Orders: -     Vitamin B12  Vitamin D deficiency Assessment & Plan: Supplement and monitor   Orders: -     VITAMIN D 25 Hydroxy (Vit-D Deficiency, Fractures)  Hyperglycemia -     Hemoglobin A1c  Other orders -     Estradiol; Place 1 patch onto the skin 2 (two) times a week.  Dispense: 8 patch; Refill: 5    Assessment and Plan    Menopausal Symptoms Persistent hot flashes and vaginal dryness despite current low-dose estrogen patch. Breast changes noted. Improvement seen with initiation of estrogen patch, but not to desired level. -Increase estrogen patch to next strength. -Consider further increase in dose if symptoms persist after 1-2  months.  Thyroid Disorder TSH suppressed, indicating potential overtreatment. Free T4 within normal range. Patient has lost weight which may affect thyroid hormone requirements. -Check anti-TPO to assess for Hashimoto's thyroiditis. -Consider adjusting thyroid medication dose based on TPO results and symptoms.  General Health Maintenance -Check hemoglobin A1c, vitamin D, and B12 levels. -Consider referral to endocrinologist or naturopath for further evaluation of hormone replacement therapy, including potential addition of progesterone and/or testosterone. -Follow-up visit in February 2025.         Danise Edge, MD

## 2022-11-10 NOTE — Patient Instructions (Signed)
Hypertension, Adult High blood pressure (hypertension) is when the force of blood pumping through the arteries is too strong. The arteries are the blood vessels that carry blood from the heart throughout the body. Hypertension forces the heart to work harder to pump blood and may cause arteries to become narrow or stiff. Untreated or uncontrolled hypertension can lead to a heart attack, heart failure, a stroke, kidney disease, and other problems. A blood pressure reading consists of a higher number over a lower number. Ideally, your blood pressure should be below 120/80. The first ("top") number is called the systolic pressure. It is a measure of the pressure in your arteries as your heart beats. The second ("bottom") number is called the diastolic pressure. It is a measure of the pressure in your arteries as the heart relaxes. What are the causes? The exact cause of this condition is not known. There are some conditions that result in high blood pressure. What increases the risk? Certain factors may make you more likely to develop high blood pressure. Some of these risk factors are under your control, including: Smoking. Not getting enough exercise or physical activity. Being overweight. Having too much fat, sugar, calories, or salt (sodium) in your diet. Drinking too much alcohol. Other risk factors include: Having a personal history of heart disease, diabetes, high cholesterol, or kidney disease. Stress. Having a family history of high blood pressure and high cholesterol. Having obstructive sleep apnea. Age. The risk increases with age. What are the signs or symptoms? High blood pressure may not cause symptoms. Very high blood pressure (hypertensive crisis) may cause: Headache. Fast or irregular heartbeats (palpitations). Shortness of breath. Nosebleed. Nausea and vomiting. Vision changes. Severe chest pain, dizziness, and seizures. How is this diagnosed? This condition is diagnosed by  measuring your blood pressure while you are seated, with your arm resting on a flat surface, your legs uncrossed, and your feet flat on the floor. The cuff of the blood pressure monitor will be placed directly against the skin of your upper arm at the level of your heart. Blood pressure should be measured at least twice using the same arm. Certain conditions can cause a difference in blood pressure between your right and left arms. If you have a high blood pressure reading during one visit or you have normal blood pressure with other risk factors, you may be asked to: Return on a different day to have your blood pressure checked again. Monitor your blood pressure at home for 1 week or longer. If you are diagnosed with hypertension, you may have other blood or imaging tests to help your health care provider understand your overall risk for other conditions. How is this treated? This condition is treated by making healthy lifestyle changes, such as eating healthy foods, exercising more, and reducing your alcohol intake. You may be referred for counseling on a healthy diet and physical activity. Your health care provider may prescribe medicine if lifestyle changes are not enough to get your blood pressure under control and if: Your systolic blood pressure is above 130. Your diastolic blood pressure is above 80. Your personal target blood pressure may vary depending on your medical conditions, your age, and other factors. Follow these instructions at home: Eating and drinking  Eat a diet that is high in fiber and potassium, and low in sodium, added sugar, and fat. An example of this eating plan is called the DASH diet. DASH stands for Dietary Approaches to Stop Hypertension. To eat this way: Eat   plenty of fresh fruits and vegetables. Try to fill one half of your plate at each meal with fruits and vegetables. Eat whole grains, such as whole-wheat pasta, brown rice, or whole-grain bread. Fill about one  fourth of your plate with whole grains. Eat or drink low-fat dairy products, such as skim milk or low-fat yogurt. Avoid fatty cuts of meat, processed or cured meats, and poultry with skin. Fill about one fourth of your plate with lean proteins, such as fish, chicken without skin, beans, eggs, or tofu. Avoid pre-made and processed foods. These tend to be higher in sodium, added sugar, and fat. Reduce your daily sodium intake. Many people with hypertension should eat less than 1,500 mg of sodium a day. Do not drink alcohol if: Your health care provider tells you not to drink. You are pregnant, may be pregnant, or are planning to become pregnant. If you drink alcohol: Limit how much you have to: 0-1 drink a day for women. 0-2 drinks a day for men. Know how much alcohol is in your drink. In the [place] one drink equals one 12 oz bottle of beer (355 mL), one 5 oz glass of wine (148 mL), or one 1 oz glass of hard liquor (44 mL). Lifestyle  Work with your health care provider to maintain a healthy body weight or to lose weight. Ask what an ideal weight is for you. Get at least 30 minutes of exercise that causes your heart to beat faster (aerobic exercise) most days of the week. Activities may include walking, swimming, or biking. Include exercise to strengthen your muscles (resistance exercise), such as Pilates or lifting weights, as part of your weekly exercise routine. Try to do these types of exercises for 30 minutes at least 3 days a week. Do not use any products that contain nicotine or tobacco. These products include cigarettes, chewing tobacco, and vaping devices, such as e-cigarettes. If you need help quitting, ask your health care provider. Monitor your blood pressure at home as told by your health care provider. Keep all follow-up visits. This is important. Medicines Take over-the-counter and prescription medicines only as told by your health care provider. Follow directions carefully. Blood  pressure medicines must be taken as prescribed. Do not skip doses of blood pressure medicine. Doing this puts you at risk for problems and can make the medicine less effective. Ask your health care provider about side effects or reactions to medicines that you should watch for. Contact a health care provider if you: Think you are having a reaction to a medicine you are taking. Have headaches that keep coming back (recurring). Feel dizzy. Have swelling in your ankles. Have trouble with your vision. Get help right away if you: Develop a severe headache or confusion. Have unusual weakness or numbness. Feel faint. Have severe pain in your chest or abdomen. Vomit repeatedly. Have trouble breathing. These symptoms may be an emergency. Get help right away. Call 911. Do not wait to see if the symptoms will go away. Do not drive yourself to the hospital. Summary Hypertension is when the force of blood pumping through your arteries is too strong. If this condition is not controlled, it may put you at risk for serious complications. Your personal target blood pressure may vary depending on your medical conditions, your age, and other factors. For most people, a normal blood pressure is less than 120/80. Hypertension is treated with lifestyle changes, medicines, or a combination of both. Lifestyle changes include losing weight, eating a healthy,   low-sodium diet, exercising more, and limiting alcohol. This information is not intended to replace advice given to you by your health care provider. Make sure you discuss any questions you have with your health care provider. Document Revised: 11/06/2020 Document Reviewed: 11/06/2020 Elsevier Patient Education  2024 Elsevier Inc.  

## 2022-11-11 LAB — THYROID PEROXIDASE ANTIBODY: Thyroperoxidase Ab SerPl-aCnc: 103 [IU]/mL — ABNORMAL HIGH (ref <9)

## 2022-11-17 ENCOUNTER — Other Ambulatory Visit: Payer: Self-pay | Admitting: Family Medicine

## 2022-11-19 DIAGNOSIS — L821 Other seborrheic keratosis: Secondary | ICD-10-CM | POA: Diagnosis not present

## 2022-11-19 DIAGNOSIS — B078 Other viral warts: Secondary | ICD-10-CM | POA: Diagnosis not present

## 2022-11-19 DIAGNOSIS — L814 Other melanin hyperpigmentation: Secondary | ICD-10-CM | POA: Diagnosis not present

## 2022-11-19 DIAGNOSIS — L538 Other specified erythematous conditions: Secondary | ICD-10-CM | POA: Diagnosis not present

## 2022-11-19 DIAGNOSIS — Z789 Other specified health status: Secondary | ICD-10-CM | POA: Diagnosis not present

## 2022-11-19 DIAGNOSIS — L02221 Furuncle of abdominal wall: Secondary | ICD-10-CM | POA: Diagnosis not present

## 2022-11-19 DIAGNOSIS — D1801 Hemangioma of skin and subcutaneous tissue: Secondary | ICD-10-CM | POA: Diagnosis not present

## 2022-11-19 DIAGNOSIS — R238 Other skin changes: Secondary | ICD-10-CM | POA: Diagnosis not present

## 2022-11-20 ENCOUNTER — Encounter: Payer: Self-pay | Admitting: Family Medicine

## 2022-11-21 NOTE — Telephone Encounter (Signed)
Requesting:Adderall Contract:02/03/2022 UDS:02/03/2022 Last Visit:11/10/2022 Next Visit:03/10/2023 Last Refill:10/01/2022  Please Advise

## 2022-11-22 ENCOUNTER — Other Ambulatory Visit: Payer: Self-pay | Admitting: Family

## 2022-11-22 MED ORDER — AMPHETAMINE-DEXTROAMPHETAMINE 20 MG PO TABS: 20.0000 mg | ORAL_TABLET | Freq: Two times a day (BID) | ORAL | 0 refills | Status: DC

## 2022-12-01 DIAGNOSIS — M7062 Trochanteric bursitis, left hip: Secondary | ICD-10-CM | POA: Diagnosis not present

## 2022-12-01 DIAGNOSIS — M533 Sacrococcygeal disorders, not elsewhere classified: Secondary | ICD-10-CM | POA: Diagnosis not present

## 2022-12-01 DIAGNOSIS — M545 Low back pain, unspecified: Secondary | ICD-10-CM | POA: Diagnosis not present

## 2022-12-01 DIAGNOSIS — M542 Cervicalgia: Secondary | ICD-10-CM | POA: Diagnosis not present

## 2022-12-04 DIAGNOSIS — M533 Sacrococcygeal disorders, not elsewhere classified: Secondary | ICD-10-CM | POA: Diagnosis not present

## 2022-12-15 DIAGNOSIS — M542 Cervicalgia: Secondary | ICD-10-CM | POA: Diagnosis not present

## 2022-12-22 DIAGNOSIS — M7062 Trochanteric bursitis, left hip: Secondary | ICD-10-CM | POA: Diagnosis not present

## 2022-12-22 DIAGNOSIS — M542 Cervicalgia: Secondary | ICD-10-CM | POA: Diagnosis not present

## 2022-12-22 DIAGNOSIS — M545 Low back pain, unspecified: Secondary | ICD-10-CM | POA: Diagnosis not present

## 2022-12-22 DIAGNOSIS — M533 Sacrococcygeal disorders, not elsewhere classified: Secondary | ICD-10-CM | POA: Diagnosis not present

## 2023-01-01 DIAGNOSIS — M47812 Spondylosis without myelopathy or radiculopathy, cervical region: Secondary | ICD-10-CM | POA: Diagnosis not present

## 2023-01-09 ENCOUNTER — Other Ambulatory Visit: Payer: Self-pay | Admitting: Family Medicine

## 2023-01-09 ENCOUNTER — Encounter: Payer: Self-pay | Admitting: Family Medicine

## 2023-01-09 MED ORDER — HYDROCODONE-ACETAMINOPHEN 5-325 MG PO TABS: 1.0000 | ORAL_TABLET | Freq: Four times a day (QID) | ORAL | 0 refills | Status: DC | PRN

## 2023-01-09 MED ORDER — LIDOCAINE HCL URETHRAL/MUCOSAL 2 % EX PRSY: 1.0000 | PREFILLED_SYRINGE | Freq: Two times a day (BID) | CUTANEOUS | 3 refills | Status: AC | PRN

## 2023-01-09 MED ORDER — TOBRAMYCIN-DEXAMETHASONE 0.3-0.1 % OP SUSP: 1.0000 [drp] | OPHTHALMIC | 0 refills | Status: DC

## 2023-01-09 MED ORDER — MELOXICAM 15 MG PO TABS: 15.0000 mg | ORAL_TABLET | Freq: Every day | ORAL | 0 refills | Status: AC

## 2023-01-09 MED ORDER — CYCLOBENZAPRINE HCL 10 MG PO TABS: 10.0000 mg | ORAL_TABLET | Freq: Three times a day (TID) | ORAL | 2 refills | Status: AC | PRN

## 2023-01-09 MED ORDER — ALBUTEROL SULFATE HFA 108 (90 BASE) MCG/ACT IN AERS: 2.0000 | INHALATION_SPRAY | Freq: Four times a day (QID) | RESPIRATORY_TRACT | 3 refills | Status: AC | PRN

## 2023-01-09 MED ORDER — BUDESONIDE-FORMOTEROL FUMARATE 160-4.5 MCG/ACT IN AERO: INHALATION_SPRAY | RESPIRATORY_TRACT | 5 refills | Status: AC

## 2023-01-09 MED ORDER — ESTRADIOL 0.025 MG/24HR TD PTWK: 0.0250 mg | MEDICATED_PATCH | TRANSDERMAL | 5 refills | Status: DC

## 2023-01-09 MED ORDER — FLUCONAZOLE 150 MG PO TABS: 150.0000 mg | ORAL_TABLET | ORAL | 2 refills | Status: DC

## 2023-01-09 MED ORDER — EPINEPHRINE 0.3 MG/0.3ML IJ SOAJ: 0.3000 mg | INTRAMUSCULAR | 2 refills | Status: AC | PRN

## 2023-01-12 ENCOUNTER — Telehealth: Payer: Self-pay

## 2023-01-12 NOTE — Telephone Encounter (Signed)
PA initiated via Covermymeds; KEY: [id number]. Awaiting determination.

## 2023-01-12 NOTE — Telephone Encounter (Signed)
PA approved.   Approved. Authorization Expiration Date: 02/11/2023

## 2023-01-12 NOTE — Telephone Encounter (Signed)
PA initiated via Covermymeds;KEY: [id number]. Awaiting determination.

## 2023-01-13 DIAGNOSIS — R58 Hemorrhage, not elsewhere classified: Secondary | ICD-10-CM | POA: Diagnosis not present

## 2023-01-13 DIAGNOSIS — Z118 Encounter for screening for other infectious and parasitic diseases: Secondary | ICD-10-CM | POA: Diagnosis not present

## 2023-01-13 DIAGNOSIS — Z8742 Personal history of other diseases of the female genital tract: Secondary | ICD-10-CM | POA: Diagnosis not present

## 2023-01-13 DIAGNOSIS — Z1231 Encounter for screening mammogram for malignant neoplasm of breast: Secondary | ICD-10-CM | POA: Diagnosis not present

## 2023-01-13 DIAGNOSIS — Z124 Encounter for screening for malignant neoplasm of cervix: Secondary | ICD-10-CM | POA: Diagnosis not present

## 2023-01-13 DIAGNOSIS — Z1159 Encounter for screening for other viral diseases: Secondary | ICD-10-CM | POA: Diagnosis not present

## 2023-01-13 DIAGNOSIS — Z113 Encounter for screening for infections with a predominantly sexual mode of transmission: Secondary | ICD-10-CM | POA: Diagnosis not present

## 2023-01-13 DIAGNOSIS — Z1331 Encounter for screening for depression: Secondary | ICD-10-CM | POA: Diagnosis not present

## 2023-01-13 DIAGNOSIS — Z114 Encounter for screening for human immunodeficiency virus [HIV]: Secondary | ICD-10-CM | POA: Diagnosis not present

## 2023-01-13 DIAGNOSIS — Z01419 Encounter for gynecological examination (general) (routine) without abnormal findings: Secondary | ICD-10-CM | POA: Diagnosis not present

## 2023-01-13 LAB — HM MAMMOGRAPHY

## 2023-01-13 MED ORDER — ESTRADIOL 0.025 MG/24HR TD PTWK: 0.0250 mg | MEDICATED_PATCH | TRANSDERMAL | 5 refills | Status: DC

## 2023-01-15 NOTE — Telephone Encounter (Signed)
 PA denied. Alternatives: lidocaine 4% solution, lidocaine-prilocaine 2.5-2.5% cream

## 2023-01-15 NOTE — Telephone Encounter (Signed)
 PA denied. Awaiting denial information.

## 2023-01-16 ENCOUNTER — Encounter: Payer: Self-pay | Admitting: Emergency Medicine

## 2023-01-16 NOTE — Telephone Encounter (Signed)
 Called patient. No answer, LVM. Will message patient in Mychart also

## 2023-01-19 NOTE — Telephone Encounter (Signed)
 Called patient. No answer. LVM

## 2023-01-21 ENCOUNTER — Encounter: Payer: Self-pay | Admitting: Emergency Medicine

## 2023-01-30 ENCOUNTER — Telehealth (INDEPENDENT_AMBULATORY_CARE_PROVIDER_SITE_OTHER): Payer: BC Managed Care – PPO | Admitting: Family

## 2023-01-30 ENCOUNTER — Telehealth: Payer: Self-pay

## 2023-01-30 DIAGNOSIS — H10021 Other mucopurulent conjunctivitis, right eye: Secondary | ICD-10-CM | POA: Diagnosis not present

## 2023-01-30 DIAGNOSIS — J329 Chronic sinusitis, unspecified: Secondary | ICD-10-CM

## 2023-01-30 DIAGNOSIS — H9203 Otalgia, bilateral: Secondary | ICD-10-CM | POA: Diagnosis not present

## 2023-01-30 MED ORDER — AMOXICILLIN-POT CLAVULANATE 875-125 MG PO TABS: 1.0000 | ORAL_TABLET | Freq: Two times a day (BID) | ORAL | 0 refills | Status: DC

## 2023-01-30 MED ORDER — PREDNISONE 10 MG PO TABS: ORAL_TABLET | ORAL | 0 refills | Status: DC

## 2023-01-30 MED ORDER — NEOMYCIN-POLYMYXIN-HC 3.5-10000-1 OP SUSP: 3.0000 [drp] | Freq: Four times a day (QID) | OPHTHALMIC | 0 refills | Status: DC

## 2023-01-30 NOTE — Telephone Encounter (Addendum)
PA initiated via Covermymeds; KEY: [id number].

## 2023-01-30 NOTE — Telephone Encounter (Signed)
PA was initially ran through for ear drops by mistake, PA was denied. Appeal sent electronically, but then cancelled by plan.

## 2023-01-30 NOTE — Progress Notes (Signed)
MyChart Video Visit    Virtual Visit via Video Note    Patient location: Home. Patient and provider in visit Provider location: Office  I discussed the limitations of evaluation and management by telemedicine and the availability of in person appointments. The patient expressed understanding and agreed to proceed.  Visit Date: 01/30/2023  Today's healthcare provider: Lemont Fillers, NP     Subjective:    Patient ID: Kristen [name], female    DOB: November 10, 1975, 48 y.o.   MRN: [id number]  Chief Complaint  Patient presents with   Sinus Problem    Patient complains of sinus pain x almost 2 weeks   Otalgia    Patient complains of bilateral ear pain for about 3 days    HPI  The patient, with a history of recurrent sinusitis and otitis media, presents with sinus pressure, tooth pain, and bloody nasal discharge. She also reports bilateral ear pressure, pain, and crackling, which has been worsening over the past two to three days. She is concerned about an impending ear rupture. She has been managing her symptoms with a decongestant and Flonase twice daily, but her symptoms have not improved.  In addition, she has been experiencing right eye redness and irritation. She is unsure if this is due to an infection or allergy. She denies any crusting or discharge from the eye. Past Medical History:  Diagnosis Date   Abnormal cervical cytology 09/26/2011   LGSIL in 2011, patient reports repeat pap was normal, has had intermittent abnormals with bx in past but always normalized, has had cryotherapy with good results Menarche at 17, irregular without birth control at times, very heavy LMP 08/26/2011 No gyn concerns, MGM last year normal    ADD (attention deficit disorder)    Allergy    seasonal   Anemia    hx of none recent per pt on 12-27-2020   Asthma    mild, intermittent   Chicken pox as a child   Chronic kidney disease    Claustrophobia 12/27/2020   Concussion 09/24/2010   DJD  (degenerative joint disease)    lower back   Family history of heart disease    Female bladder prolapse, acquired 09/24/2010   Fibromyalgia    Genital herpes 12/27/2020   GERD (gastroesophageal reflux disease)    Headache 09/26/2013   History of concussion 09/24/2010   headaches  x 8 months  and  memeory problems for months no current residual from   History of kidney stones    History of migraines 09/24/2010   History of postpartum depression 2002   HSV-2 infection 09/24/2010   HTN (hypertension) 09/24/2010   Hyperlipidemia    Hypertension    Hypertensive urgency 09/24/2010   hypertension   Hypertriglyceridemia 08/11/2019   Hypothyroidism    Interstitial cystitis 02/18/2011   Left shoulder pain 05/27/2016   Migraine 09/24/2010   Migraine aura, persistent, with cerebral infarct, status over 72 hours 09/24/2010   Myalgia 06/08/2018   Neck pain, musculoskeletal 12/16/2010   oa  and djd no limited neck motion   OSA (obstructive sleep apnea) 08/28/2020   using auto set 6 to 17   Overweight 05/29/2015   Palpitations 11/20/2021   Pharyngitis    sore throat and hurts when yawns since nov t & s surgery is improving   Pneumonia    last time 2012   Poor concentration 12/16/2010   Recurrent UTI    last uti 1st of december   Self-catheterizes urinary bladder  12/27/2020   prn   Sleep apnea    Stroke [facility]) 2015   Subacute and chronic vaginitis 09/24/2010   TIA (transient ischemic attack) 2015   saw dr berry none since   Tick bite 12/04/2013   rocky [place] spotted fever x 3 months last time 2015   Urinary incontinence    weras pads   Vaginitis and vulvovaginitis 05/21/2014   Wears glasses    or contacts    Past Surgical History:  Procedure Laterality Date   ABDOMINAL SACROCOLPOPEXY N/A 12/31/2020   Procedure: ABDOMINO SACROCOLPOPEXY;  Surgeon: Marguerita Beards, MD;  Location: [facility];  Service: Gynecology;  Laterality: N/A;   ANTERIOR AND  POSTERIOR REPAIR  2003   colonscopy  2017   polyps removed due now   CYSTOSCOPY N/A 12/31/2020   Procedure: CYSTOSCOPY;  Surgeon: Marguerita Beards, MD;  Location: [facility];  Service: Gynecology;  Laterality: N/A;   INCONTINENCE SURGERY  01/13/2001   ROBOTIC ASSISTED TOTAL HYSTERECTOMY N/A 12/31/2020   Procedure: XI ROBOTIC ASSISTED TOTAL HYSTERECTOMY with bilateral salpingectomy;  Surgeon: Marguerita Beards, MD;  Location: [facility];  Service: Gynecology;  Laterality: N/A;   TONSILLECTOMY  11/15/2020   and adenoids removed due to constant infections [facility] [place]    Family History  Problem Relation Age of Onset   Osteoporosis Mother    Hyperlipidemia Mother    Kidney disease Father    Heart attack Father    Hyperlipidemia Father    Hypertension Father    Alcohol abuse Father    Heart disease Father    Heart attack Brother 9   Hypertension Brother    Heart disease Brother 29       MI   Hyperlipidemia Brother    Crohn's disease Maternal Aunt    Heart disease Maternal Grandmother    Heart failure Maternal Grandmother    Valvular heart disease Maternal Grandmother    Dementia Maternal Grandfather 59       early stages   Stroke Maternal Grandfather    Heart failure Maternal Grandfather    Heart attack Paternal Grandfather    Stroke Paternal Grandfather    Heart disease Paternal Grandfather    Alcohol abuse Paternal Grandfather    Hyperlipidemia Paternal Grandfather    Hypertension Paternal Grandfather    Colon cancer Neg Hx    Stomach cancer Neg Hx    Esophageal cancer Neg Hx    Pancreatic cancer Neg Hx    Rectal cancer Neg Hx     Social History   Socioeconomic History   Marital status: Married    Spouse name: Not on file   Number of children: Not on file   Years of education: Not on file   Highest education level: Associate degree: occupational, Scientist, product/process development, or vocational program  Occupational History    Not on file  Tobacco Use   Smoking status: Never   Smokeless tobacco: Never  Vaping Use   Vaping status: Never Used  Substance and Sexual Activity   Alcohol use: No    Alcohol/week: 0.0 standard drinks of alcohol    Comment: 0   Drug use: No   Sexual activity: Yes    Partners: Male    Birth control/protection: Pill  Other Topics Concern   Not on file  Social History Narrative   Lives with husband 2 children in a one story home.     Works as a Teacher, adult education.  Education: college.   Social Drivers of Health   Financial Resource Strain: Medium Risk (11/10/2022)   Overall Financial Resource Strain (CARDIA)    Difficulty of Paying Living Expenses: Somewhat hard  Food Insecurity: No Food Insecurity (11/10/2022)   Hunger Vital Sign    Worried About Running Out of Food in the Last Year: Never true    Ran Out of Food in the Last Year: Never true  Transportation Needs: No Transportation Needs (11/10/2022)   PRAPARE - Administrator, Civil Service (Medical): No    Lack of Transportation (Non-Medical): No  Physical Activity: Sufficiently Active (11/10/2022)   Exercise Vital Sign    Days of Exercise per Week: 4 days    Minutes of Exercise per Session: 50 min  Stress: Stress Concern Present (11/10/2022)   [employer] of Occupational Health - Occupational Stress Questionnaire    Feeling of Stress : To some extent  Social Connections: Moderately Isolated (11/10/2022)   Social Connection and Isolation Panel [NHANES]    Frequency of Communication with Friends and Family: [name] than three times a week    Frequency of Social Gatherings with Friends and Family: [name] than three times a week    Attends Religious Services: Never    Database administrator or Organizations: No    Attends Engineer, structural: Not on file    Marital Status: Married  Intimate Partner Violence: Unknown (04/17/2021)   Received from [employer], [facility]   HITS    Physically  Hurt: Not on file    Insult or Talk Down To: Not on file    Threaten Physical Harm: Not on file    Scream or Curse: Not on file    Outpatient Medications Prior to Visit  Medication Sig Dispense Refill   acyclovir (ZOVIRAX) 200 MG capsule Take 1 capsule (200 mg total) by mouth 2 (two) times daily. 60 capsule 5   albuterol (VENTOLIN HFA) 108 (90 Base) MCG/ACT inhaler Inhale 2 puffs into the lungs every 6 (six) hours as needed. 1 each 3   ALPRAZolam (XANAX) 0.5 MG tablet 1 tablet by mouth 30 minutes prior to flight. 4 tablet 0   amphetamine-dextroamphetamine (ADDERALL) 20 MG tablet Take 1 tablet (20 mg total) by mouth 2 (two) times daily. 60 tablet 0   budesonide-formoterol (SYMBICORT) 160-4.5 MCG/ACT inhaler TAKE 2 PUFFS BY MOUTH TWICE A DAY 10.2 each 5   cloNIDine (CATAPRES) 0.1 MG tablet TAKE 2 TABLETS(0.2 MG) BY MOUTH THREE TIMES DAILY 180 tablet 3   Cyanocobalamin (VITAMIN B 12 PO) Take by mouth. Vitamin b 12 subligual daily     cyclobenzaprine (FLEXERIL) 10 MG tablet Take 1 tablet (10 mg total) by mouth 3 (three) times daily as needed for muscle spasms. 30 tablet 2   dicyclomine (BENTYL) 10 MG capsule Take 1 capsule (10 mg total) by mouth every 6 (six) hours as needed (abdominal pain). 90 capsule 0   EPINEPHrine 0.3 mg/0.3 mL IJ SOAJ injection Inject 0.3 mg into the muscle as needed for anaphylaxis. 2 each 2   estradiol (CLIMARA - DOSED IN MG/24 HR) 0.025 mg/24hr patch Place 1 patch (0.025 mg total) onto the skin 2 (two) times a week. 8 patch 5   fluconazole (DIFLUCAN) 150 MG tablet Take 1 tablet (150 mg total) by mouth once a week. Takes prn 2 tablet 2   HYDROcodone-acetaminophen (NORCO/VICODIN) 5-325 MG tablet Take 1 tablet by mouth every 6 (six) hours as needed for moderate pain (pain score  4-6). 90 tablet 0   hydrOXYzine (ATARAX) 50 MG tablet Take 1 tablet (50 mg total) by mouth every 6 (six) hours as needed for anxiety. 5 tablet 0   lidocaine (XYLOCAINE) 2 % jelly Apply 1 Application  topically 2 (two) times daily as needed. 20 mL 3   liothyronine (CYTOMEL) 5 MCG tablet TAKE 1 TABLET BY MOUTH EVERY DAY 90 tablet 1   MAGNESIUM PO Take by mouth. Otc daily     meloxicam (MOBIC) 15 MG tablet Take 1 tablet (15 mg total) by mouth daily. 30 tablet 0   methylPREDNISolone (MEDROL DOSEPAK) 4 MG TBPK tablet 5 tabs po x 1 day then 4 tabs po x 1 day then 3 tabs po x 1 day then 2 tabs po x 1 day then 1 tab po x 1 day and stop 15 tablet 0   Multiple Vitamins-Minerals (MULTIVITAMIN WITH MINERALS) tablet Take 1 tablet by mouth daily.     Naltrexone HCl, Pain, 1.5 MG CAPS Take 2-3 tablets by mouth daily. 90 capsule 3   nebivolol (BYSTOLIC) 5 MG tablet 1 tablet by mouth once daily as needed for systolic blood pressure >160. 30 tablet 3   nitrofurantoin, macrocrystal-monohydrate, (MACROBID) 100 MG capsule TAKE 1 CAPSULE FOR 1 DOSE AS NEEDED FOR PREVENTION OF UTI *AFTER SELF CATH, SWIMMING, HOT TUB, SEX* 30 capsule 8   ondansetron (ZOFRAN) 4 MG tablet Take 1 tablet (4 mg total) by mouth every 8 (eight) hours as needed for nausea or vomiting. 10 tablet 0   SYNTHROID 137 MCG tablet TAKE 1 TABLET(137 MCG) BY MOUTH DAILY 90 tablet 1   tobramycin-dexamethasone (TOBRADEX) ophthalmic solution Place 1 drop into both eyes every 4 (four) hours while awake. 5 mL 0   neomycin-polymyxin b-dexamethasone (MAXITROL) 3.5-10000-0.1 SUSP Place 1 drop into both eyes in the morning, at noon, in the evening, and at bedtime. 5 mL 1   Facility-Administered Medications Prior to Visit  Medication Dose Route Frequency Provider Last Rate Last Admin   sodium chloride flush (NS) 0.9 % injection 3 mL  3 mL Intravenous Q12H Chilton Si, MD        Allergies  Allergen Reactions   Chocolate Anaphylaxis    Trouble breathing facial swelling   Peanut-Containing Drug Products Anaphylaxis    All nuts of every kind   Clindamycin/Lincomycin Nausea And Vomiting    And diarrhea.    Estradiol Nausea Only    Anxiety, hot flashes    Influenza Vaccines Rash    Significant swelling in arm, rash, malaise, myalgias    Bystolic [Nebivolol Hcl]     Fatigue arm heaviness   Doxazosin     Would not take due to side effects listed in patient information   Enalapril     Swelling at higher doses fatigue   Hydralazine     Urinary retention and edema    Hydrochlorothiazide     Bladder issues and kidney stones   Losartan     Did not work   Psychologist, occupational [Spironolactone]     Leg cramps   Terazol [Terconazole]     Burning sensation   Latex Rash    ROS    See HPI Objective:    Physical Exam  There were no vitals taken for this visit. Wt Readings from Last 3 Encounters:  11/10/22 141 lb (64 kg)  10/07/22 142 lb 9.6 oz (64.7 kg)  05/12/22 163 lb 9.6 oz (74.2 kg)    Gen: Awake, alert, no acute distress Resp: Breathing is  even and non-labored Psych: calm/pleasant demeanor Neuro: Alert and Oriented x 3, + facial symmetry, speech is clear. Eyes: I am not able to clearly see eye in detail but pt reports sclera right eye is red    Assessment & Plan:   Problem List Items Addressed This Visit       Unprioritized   Sinusitis   Relevant Medications   amoxicillin-clavulanate (AUGMENTIN) 875-125 MG tablet   predniSONE (DELTASONE) 10 MG tablet   Pink eye disease of right eye - Primary   Relevant Medications   neomycin-polymyxin-hydrocortisone (CORTISPORIN) 3.5-10000-1 ophthalmic suspension   Otalgia of both ears   Relevant Medications   amoxicillin-clavulanate (AUGMENTIN) 875-125 MG tablet    I am having Dawnya M. Partridge start on amoxicillin-clavulanate, predniSONE, and neomycin-polymyxin-hydrocortisone. I am also having her maintain her ondansetron, multivitamin with minerals, MAGNESIUM PO, Cyanocobalamin (VITAMIN B 12 PO), hydrOXYzine, liothyronine, ALPRAZolam, nebivolol, dicyclomine, nitrofurantoin (macrocrystal-monohydrate), Naltrexone HCl (Pain), methylPREDNISolone, acyclovir, cloNIDine, Synthroid,  amphetamine-dextroamphetamine, tobramycin-dexamethasone, albuterol, budesonide-formoterol, EPINEPHrine, fluconazole, HYDROcodone-acetaminophen, meloxicam, lidocaine, cyclobenzaprine, and estradiol. We will continue to administer sodium chloride flush.  Meds ordered this encounter  Medications   amoxicillin-clavulanate (AUGMENTIN) 875-125 MG tablet    Sig: Take 1 tablet by mouth 2 (two) times daily.    Dispense:  20 tablet    Refill:  0    Supervising Provider:   Danise Edge A [[id number]]   predniSONE (DELTASONE) 10 MG tablet    Sig: 4 tabs by mouth once daily for 2 days, then 3 tabs daily x 2 days, then 2 tabs daily x 2 days, then 1 tab daily x 2 days    Dispense:  20 tablet    Refill:  0    Supervising Provider:   Danise Edge A [[id number]]   neomycin-polymyxin-hydrocortisone (CORTISPORIN) 3.5-10000-1 ophthalmic suspension    Sig: Place 3 drops into the right eye 4 (four) times daily.    Dispense:  7.5 mL    Refill:  0    Supervising Provider:   Danise Edge A [[id number]]    Lemont Fillers, NP [facility] [phone] (phone) [phone] (fax)  [facility]

## 2023-01-30 NOTE — Assessment & Plan Note (Signed)
Likely OM which should be treated by the augmentin.

## 2023-01-30 NOTE — Patient Instructions (Signed)
VISIT SUMMARY:  During today's visit, we discussed your ongoing issues with sinus pressure, tooth pain, and ear discomfort, which have not improved with your current medications. Additionally, we addressed your right eye redness and irritation.  YOUR PLAN:  -SINUSITIS AND OTITIS MEDIA: Sinusitis is an inflammation of the sinuses, often due to infection, causing pressure and pain. Otitis media is an infection or inflammation of the middle ear. We will start you on Augmentin to treat the infection and a prednisone pack to reduce the swelling in your sinuses.  -CONJUNCTIVITIS: Conjunctivitis is an inflammation or infection of the outer membrane of the eyeball and the inner eyelid, causing redness and irritation. We will start you on Cortisporin eye drops. If your symptoms do not improve in 3-4 days, please schedule an office visit for a thorough exam.  INSTRUCTIONS:  Please start taking Augmentin and the prednisone pack as prescribed. Use the Cortisporin eye drops as directed. If your eye symptoms do not improve in 3-4 days, schedule an office visit for further evaluation.

## 2023-01-30 NOTE — Assessment & Plan Note (Signed)
New, rx wit augmentin.  She states she usually also requires a pred taper. Pred taper sent.

## 2023-01-30 NOTE — Telephone Encounter (Signed)
Tried calling [facility] to see if Pt paid OOP for meds. After extended hold, I hung up.

## 2023-02-01 ENCOUNTER — Other Ambulatory Visit: Payer: Self-pay | Admitting: Family

## 2023-02-01 DIAGNOSIS — H10021 Other mucopurulent conjunctivitis, right eye: Secondary | ICD-10-CM

## 2023-02-03 ENCOUNTER — Other Ambulatory Visit: Payer: Self-pay | Admitting: Family

## 2023-02-03 DIAGNOSIS — H10021 Other mucopurulent conjunctivitis, right eye: Secondary | ICD-10-CM

## 2023-02-19 ENCOUNTER — Other Ambulatory Visit (HOSPITAL_BASED_OUTPATIENT_CLINIC_OR_DEPARTMENT_OTHER): Payer: Self-pay | Admitting: Cardiovascular Disease

## 2023-02-20 ENCOUNTER — Other Ambulatory Visit: Payer: Self-pay | Admitting: Family Medicine

## 2023-02-20 ENCOUNTER — Other Ambulatory Visit: Payer: Self-pay | Admitting: Family

## 2023-02-20 ENCOUNTER — Encounter: Payer: Self-pay | Admitting: Family Medicine

## 2023-02-20 MED ORDER — CLONIDINE HCL 0.1 MG PO TABS: 0.2000 mg | ORAL_TABLET | Freq: Three times a day (TID) | ORAL | 0 refills | Status: DC

## 2023-02-20 MED ORDER — AMPHETAMINE-DEXTROAMPHETAMINE 20 MG PO TABS: 20.0000 mg | ORAL_TABLET | Freq: Two times a day (BID) | ORAL | 0 refills | Status: DC

## 2023-03-09 ENCOUNTER — Ambulatory Visit: Payer: BC Managed Care – PPO | Admitting: Family Medicine

## 2023-03-10 ENCOUNTER — Ambulatory Visit: Payer: BC Managed Care – PPO | Admitting: Family Medicine

## 2023-03-15 NOTE — Assessment & Plan Note (Signed)
 Well controlled, no changes to meds. Encouraged heart healthy diet such as the DASH diet and exercise as tolerated.

## 2023-03-15 NOTE — Assessment & Plan Note (Signed)
 Supplement and monitor

## 2023-03-15 NOTE — Assessment & Plan Note (Signed)
 Encourage heart healthy diet such as MIND or DASH diet, increase exercise, avoid trans fats, simple carbohydrates and processed foods, consider a krill or fish or flaxseed oil cap daily.

## 2023-03-15 NOTE — Assessment & Plan Note (Signed)
 No c/o recent UTI

## 2023-03-15 NOTE — Assessment & Plan Note (Signed)
On Levothyroxine and Cytomel, continue to monitor TSH suppressed

## 2023-03-16 ENCOUNTER — Encounter: Payer: Self-pay | Admitting: Family Medicine

## 2023-03-16 ENCOUNTER — Ambulatory Visit (INDEPENDENT_AMBULATORY_CARE_PROVIDER_SITE_OTHER): Payer: BC Managed Care – PPO | Admitting: Family Medicine

## 2023-03-16 VITALS — BP 136/88 | HR 71 | Temp 97.6°F | Resp 18 | Ht 62.0 in | Wt 133.8 lb

## 2023-03-16 DIAGNOSIS — N390 Urinary tract infection, site not specified: Secondary | ICD-10-CM

## 2023-03-16 DIAGNOSIS — R739 Hyperglycemia, unspecified: Secondary | ICD-10-CM

## 2023-03-16 DIAGNOSIS — I10 Essential (primary) hypertension: Secondary | ICD-10-CM | POA: Diagnosis not present

## 2023-03-16 DIAGNOSIS — E538 Deficiency of other specified B group vitamins: Secondary | ICD-10-CM | POA: Diagnosis not present

## 2023-03-16 DIAGNOSIS — E782 Mixed hyperlipidemia: Secondary | ICD-10-CM

## 2023-03-16 DIAGNOSIS — J329 Chronic sinusitis, unspecified: Secondary | ICD-10-CM | POA: Diagnosis not present

## 2023-03-16 DIAGNOSIS — E039 Hypothyroidism, unspecified: Secondary | ICD-10-CM

## 2023-03-16 DIAGNOSIS — M79676 Pain in unspecified toe(s): Secondary | ICD-10-CM

## 2023-03-16 DIAGNOSIS — E559 Vitamin D deficiency, unspecified: Secondary | ICD-10-CM | POA: Diagnosis not present

## 2023-03-16 LAB — VITAMIN D 25 HYDROXY (VIT D DEFICIENCY, FRACTURES): VITD: 41.48 ng/mL (ref 30.00–100.00)

## 2023-03-16 LAB — URIC ACID: Uric Acid, Serum: 4.8 mg/dL (ref 2.4–7.0)

## 2023-03-16 LAB — CBC WITH DIFFERENTIAL/PLATELET
Basophils Absolute: 0 10*3/uL (ref 0.0–0.1)
Basophils Relative: 0.4 % (ref 0.0–3.0)
Eosinophils Absolute: 0.2 10*3/uL (ref 0.0–0.7)
Eosinophils Relative: 2.8 % (ref 0.0–5.0)
HCT: 43 % (ref 36.0–46.0)
Hemoglobin: 14.2 g/dL (ref 12.0–15.0)
Lymphocytes Relative: 33.6 % (ref 12.0–46.0)
Lymphs Abs: 2.8 10*3/uL (ref 0.7–4.0)
MCHC: 32.9 g/dL (ref 30.0–36.0)
MCV: 94.1 fl (ref 78.0–100.0)
Monocytes Absolute: 0.5 10*3/uL (ref 0.1–1.0)
Monocytes Relative: 6.5 % (ref 3.0–12.0)
Neutro Abs: 4.8 10*3/uL (ref 1.4–7.7)
Neutrophils Relative %: 56.7 % (ref 43.0–77.0)
Platelets: 193 10*3/uL (ref 150.0–400.0)
RBC: 4.57 Mil/uL (ref 3.87–5.11)
RDW: 13.4 % (ref 11.5–15.5)
WBC: 8.4 10*3/uL (ref 4.0–10.5)

## 2023-03-16 LAB — COMPREHENSIVE METABOLIC PANEL
ALT: 17 U/L (ref 0–35)
AST: 17 U/L (ref 0–37)
Albumin: 4.2 g/dL (ref 3.5–5.2)
Alkaline Phosphatase: 66 U/L (ref 39–117)
BUN: 17 mg/dL (ref 6–23)
CO2: 28 meq/L (ref 19–32)
Calcium: 9.1 mg/dL (ref 8.4–10.5)
Chloride: 104 meq/L (ref 96–112)
Creatinine, Ser: 0.77 mg/dL (ref 0.40–1.20)
GFR: 91.98 mL/min (ref >60.00)
Glucose, Bld: 92 mg/dL (ref 70–99)
Potassium: 4.1 meq/L (ref 3.5–5.1)
Sodium: 138 meq/L (ref 135–145)
Total Bilirubin: 0.7 mg/dL (ref 0.2–1.2)
Total Protein: 6.4 g/dL (ref 6.0–8.3)

## 2023-03-16 LAB — LIPID PANEL
Cholesterol: 210 mg/dL — ABNORMAL HIGH (ref 0–200)
HDL: 44.5 mg/dL (ref >39.00)
LDL Cholesterol: 129 mg/dL — ABNORMAL HIGH (ref 0–99)
NonHDL: 165.96
Total CHOL/HDL Ratio: 5
Triglycerides: 187 mg/dL — ABNORMAL HIGH (ref 0.0–149.0)
VLDL: 37.4 mg/dL (ref 0.0–40.0)

## 2023-03-16 LAB — URINALYSIS, ROUTINE W REFLEX MICROSCOPIC
Bilirubin Urine: NEGATIVE
Hgb urine dipstick: NEGATIVE
Ketones, ur: NEGATIVE
Leukocytes,Ua: NEGATIVE
Nitrite: NEGATIVE
Specific Gravity, Urine: 1.02 (ref 1.000–1.030)
Total Protein, Urine: NEGATIVE
Urine Glucose: NEGATIVE
Urobilinogen, UA: 0.2 (ref 0.0–1.0)
WBC, UA: NONE SEEN (ref 0–?)
pH: 6.5 (ref 5.0–8.0)

## 2023-03-16 LAB — VITAMIN B12: Vitamin B-12: 553 pg/mL (ref 211–911)

## 2023-03-16 LAB — T3, FREE: T3, Free: 2.5 pg/mL (ref 2.3–4.2)

## 2023-03-16 LAB — T4, FREE: Free T4: 0.72 ng/dL (ref 0.60–1.60)

## 2023-03-16 LAB — TSH: TSH: 1.37 u[IU]/mL (ref 0.35–5.50)

## 2023-03-16 LAB — HEMOGLOBIN A1C: Hgb A1c MFr Bld: 5.5 % (ref 4.6–6.5)

## 2023-03-16 MED ORDER — CEFDINIR 300 MG PO CAPS: 300.0000 mg | ORAL_CAPSULE | Freq: Two times a day (BID) | ORAL | 0 refills | Status: AC

## 2023-03-16 MED ORDER — METHYLPREDNISOLONE ACETATE 40 MG/ML IJ SUSP
40.0000 mg | Freq: Once | INTRAMUSCULAR | Status: AC
  Administered 2023-03-16: 40 mg via INTRAMUSCULAR

## 2023-03-16 NOTE — Progress Notes (Signed)
 Subjective:    Patient ID: Kristen [name], female    DOB: 1975-07-27, 48 y.o.   MRN: [id number]  Chief Complaint  Patient presents with   Follow-up    4 month    HPI Discussed the use of AI scribe software for clinical note transcription with the patient, who gave verbal consent to proceed.  History of Present Illness Kristen [name] is a 48 year old female with chronic sinus issues and Raynaud's phenomenon who presents with persistent respiratory symptoms and foot pain.  She has been experiencing persistent respiratory symptoms since January, characterized by nasal congestion, postnasal drip, and a sensation of not getting full oxygen, despite an oxygen saturation of 97%. Fatigue has been notable, requiring naps after work, which is atypical for her. Previous treatment with Augmentin and prednisone provided initial relief but did not fully resolve her symptoms. No fever, chills, or severe headaches, but there is an increase in mild headaches.  She has a history of Raynaud's phenomenon, primarily affecting her hands. Recently, she experienced severe pain and redness in her toes after wearing flip flops in cold weather, which she attributes to Raynaud's. The pain was intense, making even light touch unbearable, but symptoms improved with consistent use of a foot warmer.  She has experienced joint pain in her elbows and knees, with particular trouble in her elbows affecting her grip. This has persisted for about four weeks and has not improved with rest over weekends as it usually does. She is not currently taking B12 due to previously high levels.  She reports a history of urinary issues but notes improvement, having used Macrobid only twice since her last visit. She mentions a prolapse that has returned, affecting her rectally, but she is managing it and plans to address it in the future.  No gastrointestinal symptoms such as diarrhea, nausea, or vomiting. No recent urinary symptoms and her blood  pressure readings at home have been okay. She does not smoke.    Past Medical History:  Diagnosis Date   Abnormal cervical cytology 09/26/2011   LGSIL in 2011, patient reports repeat pap was normal, has had intermittent abnormals with bx in past but always normalized, has had cryotherapy with good results Menarche at 17, irregular without birth control at times, very heavy LMP 08/26/2011 No gyn concerns, MGM last year normal    ADD (attention deficit disorder)    Allergy    seasonal   Anemia    hx of none recent per pt on 12-27-2020   Asthma    mild, intermittent   Chicken pox as a child   Chronic kidney disease    Claustrophobia 12/27/2020   Concussion 09/24/2010   DJD (degenerative joint disease)    lower back   Family history of heart disease    Female bladder prolapse, acquired 09/24/2010   Fibromyalgia    Genital herpes 12/27/2020   GERD (gastroesophageal reflux disease)    Headache 09/26/2013   History of concussion 09/24/2010   headaches  x 8 months  and  memeory problems for months no current residual from   History of kidney stones    History of migraines 09/24/2010   History of postpartum depression 2002   HSV-2 infection 09/24/2010   HTN (hypertension) 09/24/2010   Hyperlipidemia    Hypertension    Hypertensive urgency 09/24/2010   hypertension   Hypertriglyceridemia 08/11/2019   Hypothyroidism    Interstitial cystitis 02/18/2011   Left shoulder pain 05/27/2016   Migraine 09/24/2010  Migraine aura, persistent, with cerebral infarct, status over 72 hours 09/24/2010   Myalgia 06/08/2018   Neck pain, musculoskeletal 12/16/2010   oa  and djd no limited neck motion   OSA (obstructive sleep apnea) 08/28/2020   using auto set 6 to 17   Overweight 05/29/2015   Palpitations 11/20/2021   Pharyngitis    sore throat and hurts when yawns since nov t & s surgery is improving   Pneumonia    last time 2012   Poor concentration 12/16/2010   Recurrent UTI    last uti  1st of december   Self-catheterizes urinary bladder 12/27/2020   prn   Sleep apnea    Stroke (HCC) 2015   Subacute and chronic vaginitis 09/24/2010   TIA (transient ischemic attack) 2015   saw dr berry none since   Tick bite 12/04/2013   rocky mountain spotted fever x 3 months last time 2015   Urinary incontinence    weras pads   Vaginitis and vulvovaginitis 05/21/2014   Wears glasses    or contacts    Past Surgical History:  Procedure Laterality Date   ABDOMINAL SACROCOLPOPEXY N/A 12/31/2020   Procedure: ABDOMINO SACROCOLPOPEXY;  Surgeon: Marguerita Beards, MD;  Location: [facility];  Service: Gynecology;  Laterality: N/A;   ANTERIOR AND POSTERIOR REPAIR  2003   colonscopy  2017   polyps removed due now   CYSTOSCOPY N/A 12/31/2020   Procedure: CYSTOSCOPY;  Surgeon: Marguerita Beards, MD;  Location: [facility];  Service: Gynecology;  Laterality: N/A;   INCONTINENCE SURGERY  01/13/2001   ROBOTIC ASSISTED TOTAL HYSTERECTOMY N/A 12/31/2020   Procedure: XI ROBOTIC ASSISTED TOTAL HYSTERECTOMY with bilateral salpingectomy;  Surgeon: Marguerita Beards, MD;  Location: [facility];  Service: Gynecology;  Laterality: N/A;   TONSILLECTOMY  11/15/2020   and adenoids removed due to constant infections at [facility] of     Family History  Problem Relation Age of Onset   Osteoporosis Mother    Hyperlipidemia Mother    Kidney disease Father    Heart attack Father    Hyperlipidemia Father    Hypertension Father    Alcohol abuse Father    Heart disease Father    Heart attack Brother 41   Hypertension Brother    Heart disease Brother 88       MI   Hyperlipidemia Brother    Crohn's disease Maternal Aunt    Heart disease Maternal Grandmother    Heart failure Maternal Grandmother    Valvular heart disease Maternal Grandmother    Dementia Maternal Grandfather 34       early stages   Stroke Maternal Grandfather     Heart failure Maternal Grandfather    Heart attack Paternal Grandfather    Stroke Paternal Grandfather    Heart disease Paternal Grandfather    Alcohol abuse Paternal Grandfather    Hyperlipidemia Paternal Grandfather    Hypertension Paternal Grandfather    Colon cancer Neg Hx    Stomach cancer Neg Hx    Esophageal cancer Neg Hx    Pancreatic cancer Neg Hx    Rectal cancer Neg Hx     Social History   Socioeconomic History   Marital status: Married    Spouse name: Not on file   Number of children: Not on file   Years of education: Not on file   Highest education level: Associate degree: occupational, Scientist, product/process development, or vocational program  Occupational History   Not  on file  Tobacco Use   Smoking status: Never   Smokeless tobacco: Never  Vaping Use   Vaping status: Never Used  Substance and Sexual Activity   Alcohol use: No    Alcohol/week: 0.0 standard drinks of alcohol    Comment: 0   Drug use: No   Sexual activity: Yes    Partners: Male    Birth control/protection: Pill  Other Topics Concern   Not on file  Social History Narrative   Lives with husband 2 children in a one story home.     Works as a Teacher, adult education.     Education: college.   Social Drivers of Health   Financial Resource Strain: Medium Risk (11/10/2022)   Overall Financial Resource Strain (CARDIA)    Difficulty of Paying Living Expenses: Somewhat hard  Food Insecurity: No Food Insecurity (11/10/2022)   Hunger Vital Sign    Worried About Running Out of Food in the Last Year: Never true    Ran Out of Food in the Last Year: Never true  Transportation Needs: No Transportation Needs (11/10/2022)   PRAPARE - Administrator, Civil Service (Medical): No    Lack of Transportation (Non-Medical): No  Physical Activity: Sufficiently Active (11/10/2022)   Exercise Vital Sign    Days of Exercise per Week: 4 days    Minutes of Exercise per Session: 50 min  Stress: Stress Concern Present  (11/10/2022)   [employer] of Occupational Health - Occupational Stress Questionnaire    Feeling of Stress : To some extent  Social Connections: Moderately Isolated (11/10/2022)   Social Connection and Isolation Panel [NHANES]    Frequency of Communication with Friends and Family: More than three times a week    Frequency of Social Gatherings with Friends and Family: More than three times a week    Attends Religious Services: Never    Database administrator or Organizations: No    Attends Engineer, structural: Not on file    Marital Status: Married  Intimate Partner Violence: Unknown (04/17/2021)   Received from [employer]   HITS    Physically Hurt: Not on file    Insult or Talk Down To: Not on file    Threaten Physical Harm: Not on file    Scream or Curse: Not on file    Outpatient Medications Prior to Visit  Medication Sig Dispense Refill   acyclovir (ZOVIRAX) 200 MG capsule Take 1 capsule (200 mg total) by mouth 2 (two) times daily. 60 capsule 5   albuterol (VENTOLIN HFA) 108 (90 Base) MCG/ACT inhaler Inhale 2 puffs into the lungs every 6 (six) hours as needed. 1 each 3   ALPRAZolam (XANAX) 0.5 MG tablet 1 tablet by mouth 30 minutes prior to flight. 4 tablet 0   amoxicillin-clavulanate (AUGMENTIN) 875-125 MG tablet Take 1 tablet by mouth 2 (two) times daily. 20 tablet 0   amphetamine-dextroamphetamine (ADDERALL) 20 MG tablet Take 1 tablet (20 mg total) by mouth 2 (two) times daily. 60 tablet 0   budesonide-formoterol (SYMBICORT) 160-4.5 MCG/ACT inhaler TAKE 2 PUFFS BY MOUTH TWICE A DAY 10.2 each 5   cloNIDine (CATAPRES) 0.1 MG tablet Take 2 tablets (0.2 mg total) by mouth 3 (three) times daily. 180 tablet 0   Cyanocobalamin (VITAMIN B 12 PO) Take by mouth. Vitamin b 12 subligual daily     cyclobenzaprine (FLEXERIL) 10 MG tablet Take 1 tablet (10 mg total) by mouth 3 (three) times daily as needed  for muscle spasms. 30 tablet 2   dicyclomine (BENTYL) 10  MG capsule Take 1 capsule (10 mg total) by mouth every 6 (six) hours as needed (abdominal pain). 90 capsule 0   EPINEPHrine 0.3 mg/0.3 mL IJ SOAJ injection Inject 0.3 mg into the muscle as needed for anaphylaxis. 2 each 2   estradiol (CLIMARA - DOSED IN MG/24 HR) 0.025 mg/24hr patch Place 1 patch (0.025 mg total) onto the skin 2 (two) times a week. 8 patch 5   fluconazole (DIFLUCAN) 150 MG tablet Take 1 tablet (150 mg total) by mouth once a week. Takes prn 2 tablet 2   HYDROcodone-acetaminophen (NORCO/VICODIN) 5-325 MG tablet Take 1 tablet by mouth every 6 (six) hours as needed for moderate pain (pain score 4-6). 90 tablet 0   hydrOXYzine (ATARAX) 50 MG tablet Take 1 tablet (50 mg total) by mouth every 6 (six) hours as needed for anxiety. 5 tablet 0   lidocaine (XYLOCAINE) 2 % jelly Apply 1 Application topically 2 (two) times daily as needed. 20 mL 3   liothyronine (CYTOMEL) 5 MCG tablet TAKE 1 TABLET BY MOUTH EVERY DAY 90 tablet 1   MAGNESIUM PO Take by mouth. Otc daily     meloxicam (MOBIC) 15 MG tablet Take 1 tablet (15 mg total) by mouth daily. 30 tablet 0   Multiple Vitamins-Minerals (MULTIVITAMIN WITH MINERALS) tablet Take 1 tablet by mouth daily.     Naltrexone HCl, Pain, 1.5 MG CAPS Take 2-3 tablets by mouth daily. 90 capsule 3   nebivolol (BYSTOLIC) 5 MG tablet 1 tablet by mouth once daily as needed for systolic blood pressure >160. 30 tablet 3   neomycin-polymyxin-hydrocortisone (CORTISPORIN) 3.5-10000-1 ophthalmic suspension SHAKE LIQUID AND INSTILL 3 DROPS IN RIGHT EYE FOUR TIMES DAILY 7.5 mL 0   nitrofurantoin, macrocrystal-monohydrate, (MACROBID) 100 MG capsule TAKE 1 CAPSULE FOR 1 DOSE AS NEEDED FOR PREVENTION OF UTI *AFTER SELF CATH, SWIMMING, HOT TUB, SEX* 30 capsule 8   ondansetron (ZOFRAN) 4 MG tablet Take 1 tablet (4 mg total) by mouth every 8 (eight) hours as needed for nausea or vomiting. 10 tablet 0   predniSONE (DELTASONE) 10 MG tablet 4 tabs by mouth once daily for 2 days,  then 3 tabs daily x 2 days, then 2 tabs daily x 2 days, then 1 tab daily x 2 days 20 tablet 0   SYNTHROID 137 MCG tablet TAKE 1 TABLET(137 MCG) BY MOUTH DAILY 90 tablet 1   tobramycin-dexamethasone (TOBRADEX) ophthalmic solution Place 1 drop into both eyes every 4 (four) hours while awake. 5 mL 0   methylPREDNISolone (MEDROL DOSEPAK) 4 MG TBPK tablet 5 tabs po x 1 day then 4 tabs po x 1 day then 3 tabs po x 1 day then 2 tabs po x 1 day then 1 tab po x 1 day and stop 15 tablet 0   Facility-Administered Medications Prior to Visit  Medication Dose Route Frequency Provider Last Rate Last Admin   sodium chloride flush (NS) 0.9 % injection 3 mL  3 mL Intravenous Q12H Chilton Si, MD        Allergies  Allergen Reactions   Chocolate Anaphylaxis    Trouble breathing facial swelling   Peanut-Containing Drug Products Anaphylaxis    All nuts of every kind   Clindamycin/Lincomycin Nausea And Vomiting    And diarrhea.    Estradiol Nausea Only    Anxiety, hot flashes   Influenza Vaccines Rash    Significant swelling in arm, rash, malaise, myalgias  Bystolic [Nebivolol Hcl]     Fatigue arm heaviness   Doxazosin     Would not take due to side effects listed in patient information   Enalapril     Swelling at higher doses fatigue   Hydralazine     Urinary retention and edema    Hydrochlorothiazide     Bladder issues and kidney stones   Losartan     Did not work   Psychologist, occupational [Spironolactone]     Leg cramps   Terazol [Terconazole]     Burning sensation   Latex Rash    Review of Systems  Constitutional:  Negative for fever and malaise/fatigue.  HENT:  Positive for congestion.   Eyes:  Negative for blurred vision.  Respiratory:  Positive for sputum production. Negative for shortness of breath.   Cardiovascular:  Negative for chest pain and palpitations.  Gastrointestinal:  Negative for abdominal pain, blood in stool and nausea.  Genitourinary:  Positive for frequency. Negative for  dysuria.  Musculoskeletal:  Positive for joint pain and myalgias. Negative for falls.  Skin:  Negative for rash.  Neurological:  Positive for headaches. Negative for dizziness and loss of consciousness.  Endo/Heme/Allergies:  Negative for environmental allergies.  Psychiatric/Behavioral:  Negative for depression. The patient is not nervous/anxious.        Objective:    Physical Exam Constitutional:      General: She is not in acute distress.    Appearance: Normal appearance. She is well-developed. She is not toxic-appearing.  HENT:     Head: Normocephalic and atraumatic.     Right Ear: External ear normal.     Left Ear: External ear normal.     Nose: Nose normal.  Eyes:     General:        Right eye: No discharge.        Left eye: No discharge.     Conjunctiva/sclera: Conjunctivae normal.  Neck:     Thyroid: No thyromegaly.  Cardiovascular:     Rate and Rhythm: Normal rate and regular rhythm.     Heart sounds: Normal heart sounds. No murmur heard. Pulmonary:     Effort: Pulmonary effort is normal. No respiratory distress.     Breath sounds: Normal breath sounds.  Abdominal:     General: Bowel sounds are normal.     Palpations: Abdomen is soft.     Tenderness: There is no abdominal tenderness. There is no guarding.  Musculoskeletal:        General: Normal range of motion.     Cervical back: Neck supple.     Comments: Tip of left great toe erythematous, no warm Right elbow>left swollen not tender to touch or erythemaotus. Medial aspect  Lymphadenopathy:     Cervical: No cervical adenopathy.  Skin:    General: Skin is warm and dry.  Neurological:     Mental Status: She is alert and oriented to person, place, and time.  Psychiatric:        Mood and Affect: Mood normal.        Behavior: Behavior normal.        Thought Content: Thought content normal.        Judgment: Judgment normal.     BP 136/88 (BP Location: Left Arm, Patient Position: Sitting, Cuff Size: Normal)    Pulse 71   Temp 97.6 F (36.4 C) (Oral)   Resp 18   Ht 5\' 2"  (1.575 m)   Wt 133 lb 12.8 oz (60.7 kg)  SpO2 97%   BMI 24.47 kg/m  Wt Readings from Last 3 Encounters:  03/16/23 133 lb 12.8 oz (60.7 kg)  11/10/22 141 lb (64 kg)  10/07/22 142 lb 9.6 oz (64.7 kg)    Diabetic Foot Exam - Simple   No data filed    Lab Results  Component Value Date   WBC 7.6 05/05/2022   HGB 14.6 05/05/2022   HCT 43.2 05/05/2022   PLT 190.0 05/05/2022   GLUCOSE 87 10/16/2022   CHOL 216 (H) 10/16/2022   TRIG 154 (H) 10/16/2022   HDL 40 10/16/2022   LDLDIRECT 144.0 05/05/2022   LDLCALC 148 (H) 10/16/2022   ALT 17 10/16/2022   AST 20 10/16/2022   NA 139 10/16/2022   K 4.2 10/16/2022   CL 103 10/16/2022   CREATININE 0.77 10/16/2022   BUN 10 10/16/2022   CO2 23 10/16/2022   TSH 0.029 (L) 10/16/2022   INR 1.09 05/23/2015   HGBA1C 5.5 11/10/2022    Lab Results  Component Value Date   TSH 0.029 (L) 10/16/2022   Lab Results  Component Value Date   WBC 7.6 05/05/2022   HGB 14.6 05/05/2022   HCT 43.2 05/05/2022   MCV 92.6 05/05/2022   PLT 190.0 05/05/2022   Lab Results  Component Value Date   NA 139 10/16/2022   K 4.2 10/16/2022   CO2 23 10/16/2022   GLUCOSE 87 10/16/2022   BUN 10 10/16/2022   CREATININE 0.77 10/16/2022   BILITOT 0.7 10/16/2022   ALKPHOS 67 10/16/2022   AST 20 10/16/2022   ALT 17 10/16/2022   PROT 6.0 10/16/2022   ALBUMIN 4.3 10/16/2022   CALCIUM 9.2 10/16/2022   ANIONGAP 8 12/27/2020   EGFR 96 10/16/2022   GFR 87.09 05/05/2022   Lab Results  Component Value Date   CHOL 216 (H) 10/16/2022   Lab Results  Component Value Date   HDL 40 10/16/2022   Lab Results  Component Value Date   LDLCALC 148 (H) 10/16/2022   Lab Results  Component Value Date   TRIG 154 (H) 10/16/2022   Lab Results  Component Value Date   CHOLHDL 5.4 (H) 10/16/2022   Lab Results  Component Value Date   HGBA1C 5.5 11/10/2022       Assessment & Plan:  Essential  hypertension Assessment & Plan: Well controlled, no changes to meds. Encouraged heart healthy diet such as the DASH diet and exercise as tolerated.     Mixed hyperlipidemia Assessment & Plan: Encourage heart healthy diet such as MIND or DASH diet, increase exercise, avoid trans fats, simple carbohydrates and processed foods, consider a krill or fish or flaxseed oil cap daily.   Orders: -     Lipid panel  Hypothyroidism, unspecified type Assessment & Plan: On Levothyroxine and Cytomel, continue to monitor TSH suppressed   Orders: -     TSH -     T4, free -     T3, free -     Thyroid peroxidase antibody  Recurrent UTI Assessment & Plan: No c/o recent UTI  Orders: -     Urinalysis, Routine w reflex microscopic -     Urine Culture  Vitamin B12 deficiency Assessment & Plan: Supplement and monitor   Orders: -     Vitamin B12  Vitamin D deficiency Assessment & Plan: Supplement and monitor   Orders: -     VITAMIN D 25 Hydroxy (Vit-D Deficiency, Fractures)  Pain of great toe, unspecified laterality -  CBC with Differential/Platelet -     Uric acid  Hyperglycemia -     Comprehensive metabolic panel -     Hemoglobin A1c  Sinusitis, unspecified chronicity, unspecified location -     methylPREDNISolone Acetate  Other orders -     Cefdinir; Take 1 capsule (300 mg total) by mouth 2 (two) times daily for 10 days.  Dispense: 20 capsule; Refill: 0    Assessment and Plan Assessment & Plan Upper Respiratory Infection Persistent symptoms since January, including postnasal drip, congestion, and fatigue. Previous treatment with Augmentin and prednisone provided partial relief but symptoms have not fully resolved. -Start Cefdinir to cover potential resistant organisms and skin infection. -Administer Depo or Solu-Medrol for inflammation.  Raynaud's Phenomenon Recent episode of severe pain and redness in toes following cold exposure. No signs of infection or  clot. -Continue to avoid cold exposure. -Consider referral to vascular surgeon if symptoms do not improve with Cefdinir.  Elbow Pain Chronic, worsening pain in both elbows, possibly due to overuse. -Consider consultation with sports medicine specialist if symptoms persist after steroid treatment.  Uterine Prolapse Recurrence of prolapse, causing rectal pressure and difficulty with bowel movements. -Consider follow-up for surgical intervention after summer.  General Health Maintenance -Continue Vitamin D supplementation. -Check uric acid levels. -Consider urine test for potential urinary symptoms.     Danise Edge, MD

## 2023-03-16 NOTE — Patient Instructions (Addendum)

## 2023-03-17 LAB — URINE CULTURE
MICRO NUMBER:: 16150348
Result:: NO GROWTH
SPECIMEN QUALITY:: ADEQUATE

## 2023-03-17 LAB — THYROID PEROXIDASE ANTIBODY: Thyroperoxidase Ab SerPl-aCnc: 135 [IU]/mL — ABNORMAL HIGH (ref <9)

## 2023-03-18 ENCOUNTER — Other Ambulatory Visit: Payer: Self-pay | Admitting: Family Medicine

## 2023-03-18 MED ORDER — ALPRAZOLAM 0.5 MG PO TABS: ORAL_TABLET | ORAL | 0 refills | Status: AC

## 2023-03-27 ENCOUNTER — Other Ambulatory Visit: Payer: Self-pay | Admitting: Family Medicine

## 2023-04-08 ENCOUNTER — Encounter (HOSPITAL_BASED_OUTPATIENT_CLINIC_OR_DEPARTMENT_OTHER): Payer: BC Managed Care – PPO | Admitting: Cardiovascular Disease

## 2023-04-19 ENCOUNTER — Other Ambulatory Visit: Payer: Self-pay | Admitting: Family Medicine

## 2023-05-26 ENCOUNTER — Other Ambulatory Visit: Payer: Self-pay | Admitting: Family Medicine

## 2023-06-13 ENCOUNTER — Encounter: Payer: Self-pay | Admitting: Family Medicine

## 2023-06-15 ENCOUNTER — Other Ambulatory Visit: Payer: Self-pay | Admitting: Family Medicine

## 2023-06-15 ENCOUNTER — Other Ambulatory Visit: Payer: Self-pay | Admitting: Family

## 2023-06-15 MED ORDER — TOBRAMYCIN-DEXAMETHASONE 0.3-0.1 % OP SUSP: 1.0000 [drp] | OPHTHALMIC | 0 refills | Status: DC

## 2023-06-15 MED ORDER — NITROFURANTOIN MONOHYD MACRO 100 MG PO CAPS: ORAL_CAPSULE | ORAL | 8 refills | Status: AC

## 2023-06-15 MED ORDER — AMPHETAMINE-DEXTROAMPHETAMINE 20 MG PO TABS: 20.0000 mg | ORAL_TABLET | Freq: Two times a day (BID) | ORAL | 0 refills | Status: DC

## 2023-06-15 NOTE — Telephone Encounter (Signed)
 Requesting: Adderall 20mg   Contract:02/03/22 UDS: 02/03/22 Last Visit: 03/16/23 Next Visit: 07/23/23 Last Refill: 02/20/23 #60 and 0RF   Please Advise

## 2023-06-23 ENCOUNTER — Other Ambulatory Visit: Payer: Self-pay | Admitting: Family Medicine

## 2023-06-26 ENCOUNTER — Other Ambulatory Visit: Payer: Self-pay | Admitting: Family Medicine

## 2023-07-11 ENCOUNTER — Other Ambulatory Visit: Payer: Self-pay | Admitting: Family Medicine

## 2023-07-11 ENCOUNTER — Encounter: Payer: Self-pay | Admitting: Family Medicine

## 2023-07-13 ENCOUNTER — Other Ambulatory Visit: Payer: Self-pay | Admitting: Family Medicine

## 2023-07-13 MED ORDER — ESTRADIOL 0.025 MG/24HR TD PTWK: 0.0250 mg | MEDICATED_PATCH | TRANSDERMAL | 5 refills | Status: DC

## 2023-07-13 MED ORDER — AMPHETAMINE-DEXTROAMPHETAMINE 20 MG PO TABS: 20.0000 mg | ORAL_TABLET | Freq: Two times a day (BID) | ORAL | 0 refills | Status: DC

## 2023-07-13 MED ORDER — TOBRAMYCIN-DEXAMETHASONE 0.3-0.1 % OP SUSP: 1.0000 [drp] | OPHTHALMIC | 0 refills | Status: AC

## 2023-07-14 ENCOUNTER — Other Ambulatory Visit: Payer: Self-pay | Admitting: Family Medicine

## 2023-07-14 MED ORDER — ESTRADIOL 0.0375 MG/24HR TD PTWK: 0.0375 mg | MEDICATED_PATCH | TRANSDERMAL | 5 refills | Status: DC

## 2023-07-14 MED ORDER — ESTRADIOL 0.0375 MG/24HR TD PTTW: 1.0000 | MEDICATED_PATCH | TRANSDERMAL | 5 refills | Status: DC

## 2023-07-14 NOTE — Telephone Encounter (Signed)
 Copied from CRM [id number]. Topic: Clinical - Prescription Issue >> Jul 14, 2023 12:19 PM Melissa C wrote: Reason for CRM: Patient has been ordering her estradiol , however the wrong dosage continually keeps getting ordered for her. Patient stated she is taking estradiol  (VIVELLE -DOT) 0.0375 MG/24HR which can be found in her previous medications. She stated she doesn't want to take what was ordered because she doesn't want to go backward and she doesn't know where the smaller dosage came from. She just took the last pill of her current medication today

## 2023-07-19 NOTE — Assessment & Plan Note (Signed)
 Supplement and monitor

## 2023-07-19 NOTE — Assessment & Plan Note (Signed)
 Well controlled, no changes to meds. Encouraged heart healthy diet such as the DASH diet and exercise as tolerated.

## 2023-07-23 ENCOUNTER — Encounter: Payer: Self-pay | Admitting: Family Medicine

## 2023-07-23 ENCOUNTER — Telehealth (INDEPENDENT_AMBULATORY_CARE_PROVIDER_SITE_OTHER): Admitting: Family Medicine

## 2023-07-23 DIAGNOSIS — F419 Anxiety disorder, unspecified: Secondary | ICD-10-CM

## 2023-07-23 DIAGNOSIS — E559 Vitamin D deficiency, unspecified: Secondary | ICD-10-CM

## 2023-07-23 DIAGNOSIS — I10 Essential (primary) hypertension: Secondary | ICD-10-CM

## 2023-07-23 DIAGNOSIS — R0789 Other chest pain: Secondary | ICD-10-CM

## 2023-07-23 DIAGNOSIS — E538 Deficiency of other specified B group vitamins: Secondary | ICD-10-CM

## 2023-07-23 DIAGNOSIS — E039 Hypothyroidism, unspecified: Secondary | ICD-10-CM

## 2023-07-23 DIAGNOSIS — R5383 Other fatigue: Secondary | ICD-10-CM

## 2023-07-23 DIAGNOSIS — E782 Mixed hyperlipidemia: Secondary | ICD-10-CM

## 2023-07-23 DIAGNOSIS — R739 Hyperglycemia, unspecified: Secondary | ICD-10-CM

## 2023-07-23 MED ORDER — AMPHETAMINE-DEXTROAMPHETAMINE 20 MG PO TABS: 20.0000 mg | ORAL_TABLET | Freq: Two times a day (BID) | ORAL | 0 refills | Status: DC

## 2023-07-23 MED ORDER — HYDROCODONE-ACETAMINOPHEN 5-325 MG PO TABS: 1.0000 | ORAL_TABLET | Freq: Four times a day (QID) | ORAL | 0 refills | Status: DC | PRN

## 2023-07-23 MED ORDER — AMPHETAMINE-DEXTROAMPHETAMINE 20 MG PO TABS: 20.0000 mg | ORAL_TABLET | Freq: Every day | ORAL | 0 refills | Status: DC

## 2023-07-23 NOTE — Progress Notes (Signed)
 MyChart Video Visit    Virtual Visit via Video Note   This patient is at least at moderate risk for complications without adequate follow up. This format is felt to be most appropriate for this patient at this time. Physical exam was limited by quality of the video and audio technology used for the visit. [name], CMA was able to get the patient set up on a video visit.  Patient location: home Patient and provider in visit Provider location: Office  I discussed the limitations of evaluation and management by telemedicine and the availability of in person appointments. The patient expressed understanding and agreed to proceed.  Visit Date: 07/23/2023  Today's healthcare provider: Harlene Horton, MD   Subjective:    Patient ID: Kristen [name], female    DOB: 08/11/1975, 48 y.o.   MRN: [id number]  Chief Complaint  Patient presents with   Medical Management of Chronic Issues    Patient presents today for a 4 month follow-up.   Quality Metric Gaps    TDAP,Pneumococcal, Hep B    HPI Discussed the use of AI scribe software for clinical note transcription with the patient, who gave verbal consent to proceed.  History of Present Illness Kristen [name] is a 48 year old female with Hashimoto's thyroiditis who presents with fatigue and chest pain.  She began experiencing unusual fatigue on Saturday, which persisted through Sunday. On Sunday, she developed symptoms resembling a viral illness, including swollen and achy glands in her neck and throat, which lasted a couple of days before subsiding. During this period, she also experienced chest pain, described as indigestion-related, lasting several days. Despite some improvement in these symptoms, she continues to feel unusually tired and emotionally affected.  She reports persistent congestion that recurs and expresses concern about potential heart issues due to her fatigue. During a workout on Monday, she experienced dizziness upon standing,  requiring her to stop and rest for ten minutes. She attempted to hydrate and consume her post-workout supplements but continued to feel unwell.  Her breathing is described as slightly off, with a feeling of heaviness in her chest, although her inhaler did not alleviate the symptoms. No wheezing is present, and she states that the heaviness is not typical of her asthma. She also experienced cold sweats for about five days, coinciding with the onset of her heartburn and throat symptoms.  No diarrhea, nausea, or vomiting, but she mentions having frequent headaches. She has a history of elevated Hashimoto's antibodies and sometimes experiences elevated inflammatory markers, and she reports she has not had a recent tick bite.  Her current medications include an estrogen patch (0.375 mg twice a week), nitrofurantoin  (Macrobid ), hydrocodone , and Adderall (20 mg twice a day). She also takes anti-inflammatory supplements post-workout, which include turmeric and ginger.  She maintains a healthy lifestyle, with no significant alcohol consumption, smoking, or excessive sugar intake. She is frustrated that despite her healthy habits, she is experiencing health issues.    Past Medical History:  Diagnosis Date   Abnormal cervical cytology 09/26/2011   LGSIL in 2011, patient reports repeat pap was normal, has had intermittent abnormals with bx in past but always normalized, has had cryotherapy with good results Menarche at 17, irregular without birth control at times, very heavy LMP 08/26/2011 No gyn concerns, MGM last year normal    ADD (attention deficit disorder)    Allergy    seasonal   Anemia    hx of none recent per pt on  12-27-2020   Asthma    mild, intermittent   Chicken pox as a child   Chronic kidney disease    Claustrophobia 12/27/2020   Concussion 09/24/2010   DJD (degenerative joint disease)    lower back   Family history of heart disease    Female bladder prolapse, acquired 09/24/2010    Fibromyalgia    Genital herpes 12/27/2020   GERD (gastroesophageal reflux disease)    Headache 09/26/2013   History of concussion 09/24/2010   headaches  x 8 months  and  memeory problems for months no current residual from   History of kidney stones    History of migraines 09/24/2010   History of postpartum depression 2002   HSV-2 infection 09/24/2010   HTN (hypertension) 09/24/2010   Hyperlipidemia    Hypertension    Hypertensive urgency 09/24/2010   hypertension   Hypertriglyceridemia 08/11/2019   Hypothyroidism    Interstitial cystitis 02/18/2011   Left shoulder pain 05/27/2016   Migraine 09/24/2010   Migraine aura, persistent, with cerebral infarct, status over 72 hours 09/24/2010   Myalgia 06/08/2018   Neck pain, musculoskeletal 12/16/2010   oa  and djd no limited neck motion   OSA (obstructive sleep apnea) 08/28/2020   using auto set 6 to 17   Overweight 05/29/2015   Palpitations 11/20/2021   Pharyngitis    sore throat and hurts when yawns since nov t & s surgery is improving   Pneumonia    last time 2012   Poor concentration 12/16/2010   Recurrent UTI    last uti 1st of december   Self-catheterizes urinary bladder 12/27/2020   prn   Sleep apnea    Stroke (HCC) 2015   Subacute and chronic vaginitis 09/24/2010   TIA (transient ischemic attack) 2015   saw dr berry none since   Tick bite 12/04/2013   rocky mountain spotted fever x 3 months last time 2015   Urinary incontinence    weras pads   Vaginitis and vulvovaginitis 05/21/2014   Wears glasses    or contacts    Past Surgical History:  Procedure Laterality Date   ABDOMINAL SACROCOLPOPEXY N/A 12/31/2020   Procedure: ABDOMINO SACROCOLPOPEXY;  Surgeon: Marilynne Rosaline SAILOR, MD;  Location: [facility];  Service: Gynecology;  Laterality: N/A;   ANTERIOR AND POSTERIOR REPAIR  2003   colonscopy  2017   polyps removed due now   CYSTOSCOPY N/A 12/31/2020   Procedure: CYSTOSCOPY;  Surgeon:  Marilynne Rosaline SAILOR, MD;  Location: [facility];  Service: Gynecology;  Laterality: N/A;   INCONTINENCE SURGERY  01/13/2001   ROBOTIC ASSISTED TOTAL HYSTERECTOMY N/A 12/31/2020   Procedure: XI ROBOTIC ASSISTED TOTAL HYSTERECTOMY with bilateral salpingectomy;  Surgeon: Marilynne Rosaline SAILOR, MD;  Location: [facility];  Service: Gynecology;  Laterality: N/A;   TONSILLECTOMY  11/15/2020   and adenoids removed due to constant infections at [facility] [place]    Family History  Problem Relation Age of Onset   Osteoporosis Mother    Hyperlipidemia Mother    Kidney disease Father    Heart attack Father    Hyperlipidemia Father    Hypertension Father    Alcohol abuse Father    Heart disease Father    Heart attack Brother 74   Hypertension Brother    Heart disease Brother 34       MI   Hyperlipidemia Brother    Crohn's disease Maternal Aunt    Heart disease Maternal Grandmother  Heart failure Maternal Grandmother    Valvular heart disease Maternal Grandmother    Dementia Maternal Grandfather 87       early stages   Stroke Maternal Grandfather    Heart failure Maternal Grandfather    Heart attack Paternal Grandfather    Stroke Paternal Grandfather    Heart disease Paternal Grandfather    Alcohol abuse Paternal Grandfather    Hyperlipidemia Paternal Grandfather    Hypertension Paternal Grandfather    Colon cancer Neg Hx    Stomach cancer Neg Hx    Esophageal cancer Neg Hx    Pancreatic cancer Neg Hx    Rectal cancer Neg Hx     Social History   Socioeconomic History   Marital status: Married    Spouse name: Not on file   Number of children: Not on file   Years of education: Not on file   Highest education level: Associate degree: occupational, Scientist, product/process development, or vocational program  Occupational History   Not on file  Tobacco Use   Smoking status: Never   Smokeless tobacco: Never  Vaping Use   Vaping status: Never Used  Substance  and Sexual Activity   Alcohol use: No    Alcohol/week: 0.0 standard drinks of alcohol    Comment: 0   Drug use: No   Sexual activity: Yes    Partners: Male    Birth control/protection: Pill  Other Topics Concern   Not on file  Social History Narrative   Lives with husband 2 children in a one story home.     Works as a Teacher, adult education.     Education: college.   Social Drivers of Health   Financial Resource Strain: Medium Risk (11/10/2022)   Overall Financial Resource Strain (CARDIA)    Difficulty of Paying Living Expenses: Somewhat hard  Food Insecurity: No Food Insecurity (11/10/2022)   Hunger Vital Sign    Worried About Running Out of Food in the Last Year: Never true    Ran Out of Food in the Last Year: Never true  Transportation Needs: No Transportation Needs (11/10/2022)   PRAPARE - Administrator, Civil Service (Medical): No    Lack of Transportation (Non-Medical): No  Physical Activity: Sufficiently Active (11/10/2022)   Exercise Vital Sign    Days of Exercise per Week: 4 days    Minutes of Exercise per Session: 50 min  Stress: Stress Concern Present (11/10/2022)   [employer] of Occupational Health - Occupational Stress Questionnaire    Feeling of Stress : To some extent  Social Connections: Moderately Isolated (11/10/2022)   Social Connection and Isolation Panel    Frequency of Communication with Friends and Family: More than three times a week    Frequency of Social Gatherings with Friends and Family: More than three times a week    Attends Religious Services: Never    Database administrator or Organizations: No    Attends Engineer, structural: Not on file    Marital Status: Married  Intimate Partner Violence: Unknown (04/17/2021)   Received from [facility]   HITS    Physically Hurt: Not on file    Insult or Talk Down To: Not on file    Threaten Physical Harm: Not on file    Scream or Curse: Not on file    Outpatient  Medications Prior to Visit  Medication Sig Dispense Refill   acyclovir  (ZOVIRAX ) 200 MG capsule Take 1 capsule (200 mg total) by mouth 2 (two)  times daily. 180 capsule 0   albuterol  (VENTOLIN  HFA) 108 (90 Base) MCG/ACT inhaler Inhale 2 puffs into the lungs every 6 (six) hours as needed. 1 each 3   ALPRAZolam  (XANAX ) 0.5 MG tablet 1 tablet by mouth 30 minutes prior to flight. 4 tablet 0   amoxicillin -clavulanate (AUGMENTIN ) 875-125 MG tablet Take 1 tablet by mouth 2 (two) times daily. 20 tablet 0   amphetamine -dextroamphetamine  (ADDERALL) 20 MG tablet Take 1 tablet (20 mg total) by mouth 2 (two) times daily. July 2025 60 tablet 0   budesonide -formoterol  (SYMBICORT ) 160-4.5 MCG/ACT inhaler TAKE 2 PUFFS BY MOUTH TWICE A DAY 10.2 each 5   cloNIDine  (CATAPRES ) 0.1 MG tablet TAKE 2 TABLETS(0.2 MG) BY MOUTH THREE TIMES DAILY 180 tablet 0   Cyanocobalamin  (VITAMIN B 12 PO) Take by mouth. Vitamin b 12 subligual daily     cyclobenzaprine  (FLEXERIL ) 10 MG tablet Take 1 tablet (10 mg total) by mouth 3 (three) times daily as needed for muscle spasms. 30 tablet 2   dicyclomine  (BENTYL ) 10 MG capsule Take 1 capsule (10 mg total) by mouth every 6 (six) hours as needed (abdominal pain). 90 capsule 0   EPINEPHrine  0.3 mg/0.3 mL IJ SOAJ injection Inject 0.3 mg into the muscle as needed for anaphylaxis. 2 each 2   estradiol  (VIVELLE -DOT) 0.0375 MG/24HR Place 1 patch onto the skin 2 (two) times a week. 8 patch 5   fluconazole  (DIFLUCAN ) 150 MG tablet Take 1 tablet (150 mg total) by mouth once a week. Takes prn 2 tablet 2   HYDROcodone -acetaminophen  (NORCO/VICODIN) 5-325 MG tablet Take 1 tablet by mouth every 6 (six) hours as needed for moderate pain (pain score 4-6). 90 tablet 0   hydrOXYzine  (ATARAX ) 50 MG tablet Take 1 tablet (50 mg total) by mouth every 6 (six) hours as needed for anxiety. 5 tablet 0   lidocaine  (XYLOCAINE ) 2 % jelly Apply 1 Application topically 2 (two) times daily as needed. 20 mL 3   liothyronine   (CYTOMEL ) 5 MCG tablet TAKE 1 TABLET BY MOUTH EVERY DAY 90 tablet 1   MAGNESIUM PO Take by mouth. Otc daily     meloxicam  (MOBIC ) 15 MG tablet Take 1 tablet (15 mg total) by mouth daily. 30 tablet 0   Multiple Vitamins-Minerals (MULTIVITAMIN WITH MINERALS) tablet Take 1 tablet by mouth daily.     Naltrexone  HCl, Pain, 1.5 MG CAPS Take 2-3 tablets by mouth daily. 90 capsule 3   nebivolol  (BYSTOLIC ) 5 MG tablet 1 tablet by mouth once daily as needed for systolic blood pressure >160. 30 tablet 3   neomycin -polymyxin-hydrocortisone (CORTISPORIN) 3.5-10000-1 ophthalmic suspension SHAKE LIQUID AND INSTILL 3 DROPS IN RIGHT EYE FOUR TIMES DAILY 7.5 mL 0   nitrofurantoin , macrocrystal-monohydrate, (MACROBID ) 100 MG capsule TAKE 1 CAPSULE FOR 1 DOSE AS NEEDED FOR PREVENTION OF UTI *AFTER SELF CATH, SWIMMING, HOT TUB, SEX* 30 capsule 8   ondansetron  (ZOFRAN ) 4 MG tablet Take 1 tablet (4 mg total) by mouth every 8 (eight) hours as needed for nausea or vomiting. 10 tablet 0   predniSONE  (DELTASONE ) 10 MG tablet 4 tabs by mouth once daily for 2 days, then 3 tabs daily x 2 days, then 2 tabs daily x 2 days, then 1 tab daily x 2 days 20 tablet 0   SYNTHROID  137 MCG tablet TAKE 1 TABLET(137 MCG) BY MOUTH DAILY 90 tablet 1   tobramycin -dexamethasone  (TOBRADEX ) ophthalmic solution Place 1 drop into both eyes every 4 (four) hours while awake. 5 mL 0  No facility-administered medications prior to visit.    Allergies  Allergen Reactions   Chocolate Anaphylaxis    Trouble breathing facial swelling   Peanut-Containing Drug Products Anaphylaxis    All nuts of every kind   Clindamycin/Lincomycin Nausea And Vomiting    And diarrhea.    Estradiol  Nausea Only    Anxiety, hot flashes   Influenza Vaccines Rash    Significant swelling in arm, rash, malaise, myalgias    Bystolic  [Nebivolol  Hcl]     Fatigue arm heaviness   Doxazosin      Would not take due to side effects listed in patient information   Enalapril       Swelling at higher doses fatigue   Hydralazine      Urinary retention and edema    Hydrochlorothiazide     Bladder issues and kidney stones   Losartan      Did not work   Spiractone [Spironolactone ]     Leg cramps   Terazol [Terconazole]     Burning sensation   Latex Rash    Review of Systems  Constitutional:  Positive for malaise/fatigue. Negative for fever.  HENT:  Negative for congestion.   Eyes:  Negative for blurred vision.  Respiratory:  Positive for shortness of breath.   Cardiovascular:  Positive for chest pain. Negative for palpitations and leg swelling.  Gastrointestinal:  Negative for abdominal pain, blood in stool and nausea.  Genitourinary:  Negative for dysuria and frequency.  Musculoskeletal:  Negative for falls.  Skin:  Negative for rash.  Neurological:  Positive for dizziness. Negative for loss of consciousness and headaches.  Endo/Heme/Allergies:  Negative for environmental allergies.  Psychiatric/Behavioral:  Negative for depression. The patient is not nervous/anxious.        Objective:    Physical Exam Constitutional:      General: She is not in acute distress.    Appearance: Normal appearance. She is not ill-appearing or toxic-appearing.  HENT:     Head: Normocephalic and atraumatic.     Right Ear: External ear normal.     Left Ear: External ear normal.     Nose: Nose normal.  Eyes:     General:        Right eye: No discharge.        Left eye: No discharge.  Pulmonary:     Effort: Pulmonary effort is normal.  Skin:    Findings: No rash.  Neurological:     Mental Status: She is alert and oriented to person, place, and time.  Psychiatric:        Behavior: Behavior normal.     There were no vitals taken for this visit. Wt Readings from Last 3 Encounters:  03/16/23 133 lb 12.8 oz (60.7 kg)  11/10/22 141 lb (64 kg)  10/07/22 142 lb 9.6 oz (64.7 kg)    Diabetic Foot Exam - Simple   No data filed    Lab Results  Component Value Date    WBC 8.4 03/16/2023   HGB 14.2 03/16/2023   HCT 43.0 03/16/2023   PLT 193.0 03/16/2023   GLUCOSE 92 03/16/2023   CHOL 210 (H) 03/16/2023   TRIG 187.0 (H) 03/16/2023   HDL 44.50 03/16/2023   LDLDIRECT 144.0 05/05/2022   LDLCALC 129 (H) 03/16/2023   ALT 17 03/16/2023   AST 17 03/16/2023   NA 138 03/16/2023   K 4.1 03/16/2023   CL 104 03/16/2023   CREATININE 0.77 03/16/2023   BUN 17 03/16/2023   CO2 28 03/16/2023  TSH 1.37 03/16/2023   INR 1.09 05/23/2015   HGBA1C 5.5 03/16/2023    Lab Results  Component Value Date   TSH 1.37 03/16/2023   Lab Results  Component Value Date   WBC 8.4 03/16/2023   HGB 14.2 03/16/2023   HCT 43.0 03/16/2023   MCV 94.1 03/16/2023   PLT 193.0 03/16/2023   Lab Results  Component Value Date   NA 138 03/16/2023   K 4.1 03/16/2023   CO2 28 03/16/2023   GLUCOSE 92 03/16/2023   BUN 17 03/16/2023   CREATININE 0.77 03/16/2023   BILITOT 0.7 03/16/2023   ALKPHOS 66 03/16/2023   AST 17 03/16/2023   ALT 17 03/16/2023   PROT 6.4 03/16/2023   ALBUMIN 4.2 03/16/2023   CALCIUM 9.1 03/16/2023   ANIONGAP 8 12/27/2020   EGFR 96 10/16/2022   GFR 91.98 03/16/2023   Lab Results  Component Value Date   CHOL 210 (H) 03/16/2023   Lab Results  Component Value Date   HDL 44.50 03/16/2023   Lab Results  Component Value Date   LDLCALC 129 (H) 03/16/2023   Lab Results  Component Value Date   TRIG 187.0 (H) 03/16/2023   Lab Results  Component Value Date   CHOLHDL 5 03/16/2023   Lab Results  Component Value Date   HGBA1C 5.5 03/16/2023       Assessment & Plan:  Essential hypertension Assessment & Plan: Well controlled, no changes to meds. Encouraged heart healthy diet such as the DASH diet and exercise as tolerated.     Vitamin B12 deficiency Assessment & Plan: Supplement and monitor    Vitamin D  deficiency Assessment & Plan: Supplement and monitor      Assessment and Plan Assessment & Plan Viral illness with possible  inflammatory response Symptoms suggest viral illness with potential cardiac inflammation. Discussed viral-induced cardiac symptoms and advised immediate care for severe chest pressure or dyspnea. Explained Mylanta use for differentiating heartburn from cardiac issues. - Order coronary artery calcium score. - Advise keeping Mylanta at home for heartburn symptoms. - Order EKG. - Order blood tests including ESR, CRP, thyroid , and glucose levels. - Schedule lab appointment for Monday.  Medication management issues Reports issues with medication refills. Discussed potential increase in Adderall dosage but advised against changes until cardiac concerns are ruled out. - Send hydrocodone  prescription to [facility]. - Send three-month prescription for Adderall. - Ensure estrogen patch prescription is correct.  General Health Maintenance Maintains healthy lifestyle. Discussed potential benefits of IV drips for inflammation, though not standard care. - Encourage continued healthy lifestyle. - Discuss potential benefits of IV drips for inflammation, though not standard care.  Follow-up Scheduled follow-up lab work and EKG to assess symptoms and medication management issues. Discussed potential impact of viral illnesses on thyroid  function. - Schedule follow-up lab work and EKG for Monday. - Follow up on lab results and EKG findings.     Harlene Horton, MD

## 2023-07-24 ENCOUNTER — Telehealth: Payer: Self-pay

## 2023-07-24 ENCOUNTER — Encounter: Payer: Self-pay | Admitting: Family Medicine

## 2023-07-24 NOTE — Telephone Encounter (Signed)
 Pharmacy Patient Advocate Encounter   Received notification from CoverMyMeds that prior authorization for HYDROcodone -Acetaminophen  5-325MG  tablets  is required/requested.   Insurance verification completed.   The patient is insured through [facility] .   Per test claim: PA required; PA started via CoverMyMeds. KEY [id number] . Waiting for clinical questions to populate.

## 2023-07-27 ENCOUNTER — Other Ambulatory Visit (HOSPITAL_COMMUNITY): Payer: Self-pay

## 2023-07-27 NOTE — Telephone Encounter (Signed)
 PLEASE BE ADVISED Clinical questions have been answered and PA submitted.TO PLAN. PA currently Pending.

## 2023-07-27 NOTE — Telephone Encounter (Signed)
 Pharmacy Patient Advocate Encounter  Received notification from [facility] that Prior Authorization for HYDROcodone -Acetaminophen  5-325MG  tablets  has been APPROVED from 07/27/2023 to 08/26/2023  Please be advised  Approved. Effective Date: 07/27/2023 Authorization Expiration Date: 08/26/2023  PA #/Case ID/Reference #: [id number]   Test claim

## 2023-07-28 ENCOUNTER — Encounter (HOSPITAL_BASED_OUTPATIENT_CLINIC_OR_DEPARTMENT_OTHER): Payer: Self-pay

## 2023-08-10 ENCOUNTER — Other Ambulatory Visit: Payer: Self-pay | Admitting: Family Medicine

## 2023-09-03 ENCOUNTER — Ambulatory Visit (HOSPITAL_BASED_OUTPATIENT_CLINIC_OR_DEPARTMENT_OTHER)
Admission: RE | Admit: 2023-09-03 | Discharge: 2023-09-03 | Disposition: A | Discharge status: Home / Self Care | Payer: Self-pay | Source: Ambulatory Visit | Attending: Cardiovascular Disease | Admitting: Cardiovascular Disease

## 2023-09-03 DIAGNOSIS — Z0000 Encounter for general adult medical examination without abnormal findings: Secondary | ICD-10-CM | POA: Insufficient documentation

## 2023-09-03 DIAGNOSIS — E782 Mixed hyperlipidemia: Secondary | ICD-10-CM | POA: Insufficient documentation

## 2023-09-04 ENCOUNTER — Ambulatory Visit: Payer: Self-pay | Admitting: Cardiovascular Disease

## 2023-09-10 DIAGNOSIS — M533 Sacrococcygeal disorders, not elsewhere classified: Secondary | ICD-10-CM | POA: Diagnosis not present

## 2023-10-05 ENCOUNTER — Encounter (HOSPITAL_BASED_OUTPATIENT_CLINIC_OR_DEPARTMENT_OTHER): Payer: Self-pay | Admitting: Cardiovascular Disease

## 2023-10-05 ENCOUNTER — Ambulatory Visit (HOSPITAL_BASED_OUTPATIENT_CLINIC_OR_DEPARTMENT_OTHER): Admitting: Cardiovascular Disease

## 2023-10-05 VITALS — BP 116/82 | HR 85 | Resp 17 | Ht 62.0 in | Wt 129.0 lb

## 2023-10-05 DIAGNOSIS — E782 Mixed hyperlipidemia: Secondary | ICD-10-CM

## 2023-10-05 DIAGNOSIS — I10 Essential (primary) hypertension: Secondary | ICD-10-CM

## 2023-10-05 MED ORDER — CLONIDINE 0.2 MG/24HR TD PTWK: 0.2000 mg | MEDICATED_PATCH | TRANSDERMAL | 12 refills | Status: AC

## 2023-10-05 NOTE — Patient Instructions (Addendum)
 Medication Instructions:  HAVE PRINTED RX FOR CLONIDINE  0.2 MG PATCH TO APPLY WEEKLY. IF YOU CHOOSE TO START THIS THEN STOP THE CLONIDINE  TABLETS AND JUST USE THE PATCH. LET THE OFFICE KNOW IN A BOUT A WEEK WHAT YOU HAVE DECIDED   Labwork: NONE  Testing/Procedures: NONE  Follow-Up: 3 MONTHS WITH DR [place], CAITLIN W NP, OR PHARM D   If you need a refill on your cardiac medications before your next appointment, please call your pharmacy.

## 2023-10-05 NOTE — Progress Notes (Signed)
 [facility]pert[facility] Follow up Assessment:    Date:  10/05/2023   ID:  Kristen [name], DOB 1975-10-21, MRN [id number]  PCP:  Domenica Harlene LABOR, MD  Cardiologist:  None  Nephrologist:  Referring MD: Domenica Harlene LABOR, MD   CC: Hypertension  History of Present Illness:    Kristen [name] is a 48 y.o. female with a hx of TIA, Hashimoto's thyroiditis, hypertension and hyperlipidemia here for follow-up. She initially established care in the hypertension clinic 08/11/2019. She struggled with low BP for years.  Then her BP spiked 09/2013.  She had a TIA and saw Dr. Court.  She continued to have intermittent spiking of her BP.  She struggled with several medications and finally went on Bystolic .  Her BP was well-controlled but she gained weight and had heaviness in her L arm.  She stopped it and felt much better. Her BP started to be higher after a stomach bug and tick bite.  She was treated with enalapril  and the dose was increased.  Initially it worked well but stopped being as effective.  She had to start back on Bystolic  but is gaining weight, fatigued, and has heaviness in her chest.  Her blood pressure is controlled but the symptoms are manageable.  She has not had any orthopnea or PND but does sometimes have edema that she attributes to enalapril .  Overall her diet has been good. Her mom had heart disease at a young age so she is used to eating a healthy diet.  Lean meat and limited salt intake.  She cooks at home.  She doesn't drink caffeine or EtOH.  She takes magnesium, B12 injections, vitamin ADEK.  She has been taking Adderall since 8th grade.  She stopped taking it for a few days and her BP was still high.  She has a history of hypertriglyceridemia since she was in middle school.  She has taken fish oil without much change in her numbers.  She is also taking Krill oil.  What helped most was drinking warm water  with lemon.  She is averse to trying statins because many family members have had muscle  aches.  Of note, her father had a heart attack in his 67s.  Her brother had 2 heart attacks in his 20s.  Her mother has heart rhythm disorders.  At her initial appointment she was started on amlodipine . She stopped enalapril  and Bystolic . Since then she saw our pharmacist and increased spironolactone  12/2019.  She was referred to the PREP program through the [facility]. She has multiple medication intolerances as below.  We discussed renal denervation but it has not yet been performed.  CT of the abdomen was negative for renal artery stenosis and no adenomas were noted.  At her visit 09/2022 she was feeling well and was on semaglutide through an online service.  She lost 20lb and Bp was better controlled.  She had a calcium score of 0 on 08/2023.   Discussed the use of AI scribe software for clinical note transcription with the patient, who gave verbal consent to proceed.  History of Present Illness Kristen [name] notes that her blood pressure had been well-regulated for a significant period, allowing her to reduce clonidine  to one pill twice a day. However, after her neck injections wore off, her pain returned, and her blood pressure increased over the last three weeks. She is scheduled for neck injections in about three weeks.  Currently, she is taking clonidine  two pills three times a day,  which is barely maintaining her blood pressure. Her systolic blood pressure at home reaches up to 134 mmHg, and she experiences fluctuations towards the end of her dosing schedule. Her blood pressure is lower during exercise.  She has a history of using facet point injections for pain management, which significantly improved her blood pressure control. She previously tried Bystolic  but was allergic to it and does not currently use it.  She is on semaglutide at a dose of 0.3 mg weekly and engages in regular exercise without experiencing chest pain or breathing difficulties. She had a recent episode of numbness in her hand,  which she associates with neck issues, but it resolved without further incidents.  She experiences dry mouth as a side effect of clonidine , which was less severe when on a lower dose. She has tried various remedies without success.  No chest pain or breathing difficulties during exercise. Past episode of numbness in her hand, which resolved. Her calcium score was recently zero.   Previous antihypertensives: HCTZ - AKI, bladder issues, and kidney stones Losartan -didn't work Bystolic - fatigue, arm heaviness Enalapril - swelling at higher dose, fatigue. Amlodipine  - urine retention, edema, leg tighten, elevated HR and BP Hydralazine  - urinary retention Spironolactone  - muscle cramping Doxazosin  - would not take due to side effects listed in PI (weight gain, fatigue)   Carvedilol -refused 2/2 mother having fatigue with this medication  Past Medical History:  Diagnosis Date   Abnormal cervical cytology 09/26/2011   LGSIL in 2011, patient reports repeat pap was normal, has had intermittent abnormals with bx in past but always normalized, has had cryotherapy with good results Menarche at 17, irregular without birth control at times, very heavy LMP 08/26/2011 No gyn concerns, MGM last year normal    ADD (attention deficit disorder)    Allergy    seasonal   Anemia    hx of none recent per pt on 12-27-2020   Asthma    mild, intermittent   Chicken pox as a child   Chronic kidney disease    Claustrophobia 12/27/2020   Concussion 09/24/2010   DJD (degenerative joint disease)    lower back   Family history of heart disease    Female bladder prolapse, acquired 09/24/2010   Fibromyalgia    Genital herpes 12/27/2020   GERD (gastroesophageal reflux disease)    Headache 09/26/2013   History of concussion 09/24/2010   headaches  x 8 months  and  memeory problems for months no current residual from   History of kidney stones    History of migraines 09/24/2010   History of postpartum depression  2002   HSV-2 infection 09/24/2010   HTN (hypertension) 09/24/2010   Hyperlipidemia    Hypertension    Hypertensive urgency 09/24/2010   hypertension   Hypertriglyceridemia 08/11/2019   Hypothyroidism    Interstitial cystitis 02/18/2011   Left shoulder pain 05/27/2016   Migraine 09/24/2010   Migraine aura, persistent, with cerebral infarct, status over 72 hours 09/24/2010   Myalgia 06/08/2018   Neck pain, musculoskeletal 12/16/2010   oa  and djd no limited neck motion   OSA (obstructive sleep apnea) 08/28/2020   using auto set 6 to 17   Overweight 05/29/2015   Palpitations 11/20/2021   Pharyngitis    sore throat and hurts when yawns since nov t & s surgery is improving   Pneumonia    last time 2012   Poor concentration 12/16/2010   Recurrent UTI    last uti 1st of december  Self-catheterizes urinary bladder 12/27/2020   prn   Sleep apnea    Stroke (HCC) 2015   Subacute and chronic vaginitis 09/24/2010   TIA (transient ischemic attack) 2015   saw dr berry none since   Tick bite 12/04/2013   rocky mountain spotted fever x 3 months last time 2015   Urinary incontinence    weras pads   Vaginitis and vulvovaginitis 05/21/2014   Wears glasses    or contacts    Past Surgical History:  Procedure Laterality Date   ABDOMINAL SACROCOLPOPEXY N/A 12/31/2020   Procedure: ABDOMINO SACROCOLPOPEXY;  Surgeon: Marilynne Rosaline SAILOR, MD;  Location: [facility];  Service: Gynecology;  Laterality: N/A;   ANTERIOR AND POSTERIOR REPAIR  2003   colonscopy  2017   polyps removed due now   CYSTOSCOPY N/A 12/31/2020   Procedure: CYSTOSCOPY;  Surgeon: Marilynne Rosaline SAILOR, MD;  Location: [facility];  Service: Gynecology;  Laterality: N/A;   INCONTINENCE SURGERY  01/13/2001   ROBOTIC ASSISTED TOTAL HYSTERECTOMY N/A 12/31/2020   Procedure: XI ROBOTIC ASSISTED TOTAL HYSTERECTOMY with bilateral salpingectomy;  Surgeon: Marilynne Rosaline SAILOR, MD;  Location: [facility];  Service: Gynecology;  Laterality: N/A;   TONSILLECTOMY  11/15/2020   and adenoids removed due to constant infections at [facility] [place]    Current Medications: Current Meds  Medication Sig   acyclovir  (ZOVIRAX ) 200 MG capsule Take 1 capsule (200 mg total) by mouth 2 (two) times daily.   albuterol  (VENTOLIN  HFA) 108 (90 Base) MCG/ACT inhaler Inhale 2 puffs into the lungs every 6 (six) hours as needed.   ALPRAZolam  (XANAX ) 0.5 MG tablet 1 tablet by mouth 30 minutes prior to flight.   amphetamine -dextroamphetamine  (ADDERALL) 20 MG tablet Take 1 tablet (20 mg total) by mouth 2 (two) times daily. August 2025   amphetamine -dextroamphetamine  (ADDERALL) 20 MG tablet Take 1 tablet (20 mg total) by mouth 2 (two) times daily.   amphetamine -dextroamphetamine  (ADDERALL) 20 MG tablet Take 1 tablet (20 mg total) by mouth daily. October 2025   budesonide -formoterol  (SYMBICORT ) 160-4.5 MCG/ACT inhaler TAKE 2 PUFFS BY MOUTH TWICE A DAY   cloNIDine  (CATAPRES  - DOSED IN MG/24 HR) 0.2 mg/24hr patch Place 1 patch (0.2 mg total) onto the skin once a week.   cloNIDine  (CATAPRES ) 0.1 MG tablet Take 2 tablets (0.2 mg total) by mouth 3 (three) times daily.   cyclobenzaprine  (FLEXERIL ) 10 MG tablet Take 1 tablet (10 mg total) by mouth 3 (three) times daily as needed for muscle spasms.   dicyclomine  (BENTYL ) 10 MG capsule Take 1 capsule (10 mg total) by mouth every 6 (six) hours as needed (abdominal pain).   EPINEPHrine  0.3 mg/0.3 mL IJ SOAJ injection Inject 0.3 mg into the muscle as needed for anaphylaxis.   estradiol  (VIVELLE -DOT) 0.0375 MG/24HR Place 1 patch onto the skin 2 (two) times a week.   HYDROcodone -acetaminophen  (NORCO/VICODIN) 5-325 MG tablet Take 1 tablet by mouth every 6 (six) hours as needed for moderate pain (pain score 4-6).   hydrOXYzine  (ATARAX ) 50 MG tablet Take 1 tablet (50 mg total) by mouth every 6 (six) hours as needed for anxiety.   hyoscyamine  (LEVSIN  SL) 0.125  MG SL tablet Take 0.125 mg by mouth as needed.   lidocaine  (XYLOCAINE ) 2 % jelly Apply 1 Application topically 2 (two) times daily as needed.   MAGNESIUM PO Take by mouth. Otc daily   meloxicam  (MOBIC ) 15 MG tablet Take 1 tablet (15 mg total) by mouth daily.  Multiple Vitamins-Minerals (MULTIVITAMIN WITH MINERALS) tablet Take 1 tablet by mouth daily.   nebivolol  (BYSTOLIC ) 5 MG tablet 1 tablet by mouth once daily as needed for systolic blood pressure >160.   neomycin -polymyxin-hydrocortisone (CORTISPORIN) 3.5-10000-1 ophthalmic suspension SHAKE LIQUID AND INSTILL 3 DROPS IN RIGHT EYE FOUR TIMES DAILY   nitrofurantoin , macrocrystal-monohydrate, (MACROBID ) 100 MG capsule TAKE 1 CAPSULE FOR 1 DOSE AS NEEDED FOR PREVENTION OF UTI *AFTER SELF CATH, SWIMMING, HOT TUB, SEX*   ondansetron  (ZOFRAN ) 4 MG tablet Take 1 tablet (4 mg total) by mouth every 8 (eight) hours as needed for nausea or vomiting.   SYNTHROID  137 MCG tablet TAKE 1 TABLET(137 MCG) BY MOUTH DAILY   tobramycin -dexamethasone  (TOBRADEX ) ophthalmic solution Place 1 drop into both eyes every 4 (four) hours while awake.     Allergies:   Chocolate, Peanut-containing drug products, Clindamycin/lincomycin, Estradiol , Influenza vaccines, Bystolic  [nebivolol  hcl], Doxazosin , Enalapril , Hydralazine , Hydrochlorothiazide, Losartan , Spiractone [spironolactone ], Terazol [terconazole], and Latex   Social History   Socioeconomic History   Marital status: Married    Spouse name: Not on file   Number of children: 2   Years of education: Not on file   Highest education level: Associate degree: occupational, Scientist, product/process development, or vocational program  Occupational History   Not on file  Tobacco Use   Smoking status: Never   Smokeless tobacco: Never  Vaping Use   Vaping status: Never Used  Substance and Sexual Activity   Alcohol use: No    Alcohol/week: 0.0 standard drinks of alcohol    Comment: 0   Drug use: No   Sexual activity: Yes    Partners: Male     Birth control/protection: Pill  Other Topics Concern   Not on file  Social History Narrative   Lives with husband 2 children in a one story home.     Works as a Teacher, adult education.     Education: college.   Social Drivers of Health   Financial Resource Strain: Medium Risk (11/10/2022)   Overall Financial Resource Strain (CARDIA)    Difficulty of Paying Living Expenses: Somewhat hard  Food Insecurity: No Food Insecurity (11/10/2022)   Hunger Vital Sign    Worried About Running Out of Food in the Last Year: Never true    Ran Out of Food in the Last Year: Never true  Transportation Needs: No Transportation Needs (11/10/2022)   PRAPARE - Administrator, Civil Service (Medical): No    Lack of Transportation (Non-Medical): No  Physical Activity: Sufficiently Active (11/10/2022)   Exercise Vital Sign    Days of Exercise per Week: 4 days    Minutes of Exercise per Session: 50 min  Stress: Stress Concern Present (11/10/2022)   [employer] of Occupational Health - Occupational Stress Questionnaire    Feeling of Stress : To some extent  Social Connections: Moderately Isolated (11/10/2022)   Social Connection and Isolation Panel    Frequency of Communication with Friends and Family: More than three times a week    Frequency of Social Gatherings with Friends and Family: More than three times a week    Attends Religious Services: Never    Database administrator or Organizations: No    Attends Engineer, structural: Not on file    Marital Status: Married     Family History: The patient's family history includes Alcohol abuse in her father and paternal grandfather; Crohn's disease in her maternal aunt; Dementia (age of onset: 72) in her maternal grandfather; Heart attack in  her father and paternal grandfather; Heart attack (age of onset: 61) in her brother; Heart disease in her father, maternal grandmother, and paternal grandfather; Heart disease (age of onset: 20)  in her brother; Heart failure in her maternal grandfather and maternal grandmother; Hyperlipidemia in her brother, father, mother, and paternal grandfather; Hypertension in her brother, father, and paternal grandfather; Kidney disease in her father; Osteoporosis in her mother; Stroke in her maternal grandfather and paternal grandfather; Valvular heart disease in her maternal grandmother. There is no history of Colon cancer, Stomach cancer, Esophageal cancer, Pancreatic cancer, or Rectal cancer.  ROS:   Please see the history of present illness.  All other systems reviewed and are negative.  EKGs/Labs/Other Studies Reviewed:    CTA Abdomen  06/12/2022: IMPRESSION: 1. No acute vascular or nonvascular abnormality within the abdomen. 2. Patent, normal-appearing renal arteries without CTA evidence of renal artery stenosis.  Bilateral Renal Artery Doppler  11/25/2021: Summary:  Renal:    Right: 1-59% stenosis of the right renal artery. RRV flow present.  Left:  Cyst(s) noted. 1-59% stenosis of the left renal artery. LRV         flow present.   Coronary Calcium Score 08/30/2019: FINDINGS: Non-cardiac: See separate report from [facility]. Ascending Aorta: Pericardium: Normal Coronary arteries: No coronary calcification. IMPRESSION: Coronary calcium score of 0. This was 0 percentile for age and sex matched control.  EKG:   EKG is personally reviewed. 10/07/2022:  Not ordered. 05/12/2022:  EKG was not ordered. 08/28/2020: Sinus rhythm. Rate 96 bpm. 08/11/2019: sinus rhythm.  Rate 88 bpm.   EKG Interpretation Date/Time:  Monday October 05 2023 10:32:17 EDT Ventricular Rate:  85 PR Interval:  186 QRS Duration:  78 QT Interval:  376 QTC Calculation: 447 R Axis:   10  Text Interpretation: Normal sinus rhythm Normal ECG No previous ECGs available Confirmed by Raford Riggs ([id number]) on 10/05/2023 10:40:16 AM         Recent Labs: 03/16/2023: ALT 17; BUN 17; Creatinine, Ser  0.77; Hemoglobin 14.2; Platelets 193.0; Potassium 4.1; Sodium 138; TSH 1.37   Recent Lipid Panel    Component Value Date/Time   CHOL 210 (H) 03/16/2023 1054   CHOL 216 (H) 10/16/2022 0854   TRIG 187.0 (H) 03/16/2023 1054   HDL 44.50 03/16/2023 1054   HDL 40 10/16/2022 0854   CHOLHDL 5 03/16/2023 1054   VLDL 37.4 03/16/2023 1054   LDLCALC 129 (H) 03/16/2023 1054   LDLCALC 148 (H) 10/16/2022 0854   LDLDIRECT 144.0 05/05/2022 1220    Physical Exam:    VS:  BP 116/82 (BP Location: Right Arm, Patient Position: Sitting, Cuff Size: Normal)   Pulse 85   Resp 17   Ht 5' 2 (1.575 m)   Wt 129 lb (58.5 kg)   SpO2 99%   BMI 23.59 kg/m  , BMI Body mass index is 23.59 kg/m. GENERAL:  Well appearing HEENT: Pupils equal round and reactive, fundi not visualized, oral mucosa unremarkable NECK:  No jugular venous distention, waveform within normal limits, carotid upstroke brisk and symmetric, no bruits, no thyromegaly LUNGS:  Clear to auscultation bilaterally HEART:  RRR.  PMI not displaced or sustained,S1 and S2 within normal limits, no S3, no S4, no clicks, no rubs, no murmurs ABD:  Flat, positive bowel sounds normal in frequency in pitch, no bruits, no rebound, no guarding, no midline pulsatile mass, no hepatomegaly, no splenomegaly EXT:  2 plus pulses throughout, no edema, no cyanosis no clubbing SKIN:  No rashes  no nodules NEURO:  Cranial nerves II through XII grossly intact, motor grossly intact throughout PSYCH:  Cognitively intact, oriented to person place and time   ASSESSMENT/PLAN:    Assessment & Plan # Primary hypertension Blood pressure fluctuations due to neck pain. Clonidine  effective but causes dry mouth. Pain management improves control. - Maintain current clonidine  dosing schedule. - Consider reducing clonidine  post-facet injections. - Discuss clonidine  patches pending insurance.  We will transition to 0.2mg  weekly if it is affordable.  Discontinue clonidine  pills.  -  Provide prescription for insurance check. - Reassess post-neck injections.  # Mixed hyperlipidemia Slightly elevated cholesterol, low coronary artery disease risk with calcium score of zero. Focus on lifestyle changes. - Encourage diet and exercise. - Repeat calcium score in five years.  Disposition:  FU with APP in 6 months.    Medication Adjustments/Labs and Tests Ordered: Current medicines are reviewed at length with the patient today.  Concerns regarding medicines are outlined above.   Orders Placed This Encounter  Procedures   EKG 12-Lead   Meds ordered this encounter  Medications   cloNIDine  (CATAPRES  - DOSED IN MG/24 HR) 0.2 mg/24hr patch    Sig: Place 1 patch (0.2 mg total) onto the skin once a week.    Dispense:  4 patch    Refill:  12   Signed, Annabella Scarce, MD  10/05/2023 12:33 PM    [facility]

## 2023-10-22 ENCOUNTER — Other Ambulatory Visit: Payer: Self-pay | Admitting: Family Medicine

## 2023-11-19 DIAGNOSIS — M47812 Spondylosis without myelopathy or radiculopathy, cervical region: Secondary | ICD-10-CM | POA: Diagnosis not present

## 2023-11-25 DIAGNOSIS — L820 Inflamed seborrheic keratosis: Secondary | ICD-10-CM | POA: Diagnosis not present

## 2023-11-25 DIAGNOSIS — L814 Other melanin hyperpigmentation: Secondary | ICD-10-CM | POA: Diagnosis not present

## 2023-11-25 DIAGNOSIS — Z789 Other specified health status: Secondary | ICD-10-CM | POA: Diagnosis not present

## 2023-11-25 DIAGNOSIS — L538 Other specified erythematous conditions: Secondary | ICD-10-CM | POA: Diagnosis not present

## 2023-11-25 DIAGNOSIS — D1801 Hemangioma of skin and subcutaneous tissue: Secondary | ICD-10-CM | POA: Diagnosis not present

## 2023-11-25 DIAGNOSIS — L821 Other seborrheic keratosis: Secondary | ICD-10-CM | POA: Diagnosis not present

## 2023-11-25 DIAGNOSIS — L218 Other seborrheic dermatitis: Secondary | ICD-10-CM | POA: Diagnosis not present

## 2023-11-25 DIAGNOSIS — L2989 Other pruritus: Secondary | ICD-10-CM | POA: Diagnosis not present

## 2023-12-16 ENCOUNTER — Other Ambulatory Visit: Payer: Self-pay | Admitting: Family Medicine

## 2024-01-08 ENCOUNTER — Encounter: Payer: Self-pay | Admitting: Family Medicine

## 2024-01-12 ENCOUNTER — Other Ambulatory Visit: Payer: Self-pay | Admitting: Family Medicine

## 2024-01-12 MED ORDER — AMPHETAMINE-DEXTROAMPHETAMINE 20 MG PO TABS: 20.0000 mg | ORAL_TABLET | Freq: Two times a day (BID) | ORAL | 0 refills | Status: DC

## 2024-01-17 NOTE — Assessment & Plan Note (Signed)
 Supplement and monitor

## 2024-01-17 NOTE — Assessment & Plan Note (Signed)
 hgba1c acceptable, minimize simple carbs. Increase exercise as tolerated.

## 2024-01-17 NOTE — Assessment & Plan Note (Signed)
 No recent exacerbation

## 2024-01-17 NOTE — Assessment & Plan Note (Signed)
 Patient encouraged to maintain heart healthy diet, regular exercise, adequate sleep. Consider daily probiotics. Take medications as prescribed. Labs ordered and reviewed.  Follows with GYN, [facility], for paps and MGMs. Will request records. Last [facility] 12/2022 repeat every 1-2 years.  Colonoscopy 12/2021 repeat in 10 years.  She declines vaccinations.

## 2024-01-17 NOTE — Assessment & Plan Note (Signed)
 On Levothyroxine  and Cytomel , continue to monitor

## 2024-01-17 NOTE — Assessment & Plan Note (Signed)
 Encourage heart healthy diet such as MIND or DASH diet, increase exercise, avoid trans fats, simple carbohydrates and processed foods, consider a krill or fish or flaxseed oil cap daily.

## 2024-01-17 NOTE — Progress Notes (Signed)
 "  Subjective:    Patient ID: Kristen [name], female    DOB: [birth year], 49 y.o.   MRN: [id number]  No chief complaint on file.   HPI Discussed the use of AI scribe software for clinical note transcription with the patient, who gave verbal consent to proceed.  History of Present Illness Kristen [name] is a 49 year old female who presents with emotional distress following the suicide of her best friend.  She is experiencing significant emotional distress following the suicide of her best friend in October. She describes profound grief and struggles with overwhelming sadness despite attending counseling, which she finds helpful. She has had periods of not getting out of bed, eating, or bathing, which is new and frightening for her. She has a history of postpartum depression but generally does not experience depressive feelings. Recently, she has felt a lack of enjoyment in activities she used to love and has had thoughts of wishing she did not have to endure the emotional pain. She is concerned about the impact of her emotional state on her children and is focused on maintaining her well-being for their sake.  She is experiencing hot flashes and poor sleep. She uses an estradiol  patch at 0.375 mg twice a week. She recalls that starting the patch previously helped improve her emotional state significantly.  She reports a tender mound on the left side of her labia and inguinal lymph node swelling, suspecting a possible infection. She has not had a recent gynecological check-up and is concerned about potential infections. She has a history of prolapse and has undergone surgery twice for this issue. She is not currently seeking further surgical intervention but plans to consult a specialist in [place]  next year.  She has interstitial cystitis and uses hydrocodone  for bladder spasms and pelvic pain, especially during long car rides. She also uses it for neck and back degeneration pain, particularly when she is  far from her next injection appointment.  She takes daily acyclovir  200 mg for genital HSV and requests valacyclovir  1000 mg for outbreaks, which occur approximately twice a year. She prefers having medication on hand to manage outbreaks promptly.    Past Medical History:  Diagnosis Date   Abnormal cervical cytology 09/26/2011   LGSIL in 2011, patient reports repeat pap was normal, has had intermittent abnormals with bx in past but always normalized, has had cryotherapy with good results Menarche at 17, irregular without birth control at times, very heavy LMP 08/26/2011 No gyn concerns, MGM last year normal    ADD (attention deficit disorder)    Allergy    seasonal   Anemia    hx of none recent per pt on 12-27-2020   Asthma    mild, intermittent   Chicken pox as a child   Chronic kidney disease    Claustrophobia 12/27/2020   Concussion 09/24/2010   DJD (degenerative joint disease)    lower back   Family history of heart disease    Female bladder prolapse, acquired 09/24/2010   Fibromyalgia    Genital herpes 12/27/2020   GERD (gastroesophageal reflux disease)    Headache 09/26/2013   History of concussion 09/24/2010   headaches  x 8 months  and  memeory problems for months no current residual from   History of kidney stones    History of migraines 09/24/2010   History of postpartum depression 2002   HSV-2 infection 09/24/2010   HTN (hypertension) 09/24/2010   Hyperlipidemia    Hypertension  Hypertensive urgency 09/24/2010   hypertension   Hypertriglyceridemia 08/11/2019   Hypothyroidism    Interstitial cystitis 02/18/2011   Left shoulder pain 05/27/2016   Migraine 09/24/2010   Migraine aura, persistent, with cerebral infarct, status over 72 hours 09/24/2010   Myalgia 06/08/2018   Neck pain, musculoskeletal 12/16/2010   oa  and djd no limited neck motion   OSA (obstructive sleep apnea) 08/28/2020   using auto set 6 to 17   Overweight 05/29/2015   Palpitations  11/20/2021   Pharyngitis    sore throat and hurts when yawns since nov t & s surgery is improving   Pneumonia    last time 2012   Poor concentration 12/16/2010   Recurrent UTI    last uti 1st of december   Self-catheterizes urinary bladder 12/27/2020   prn   Sleep apnea    Stroke (HCC) 2015   Subacute and chronic vaginitis 09/24/2010   TIA (transient ischemic attack) 2015   saw dr berry none since   Tick bite 12/04/2013   rocky mountain spotted fever x 3 months last time 2015   Urinary incontinence    weras pads   Vaginitis and vulvovaginitis 05/21/2014   Wears glasses    or contacts    Past Surgical History:  Procedure Laterality Date   ABDOMINAL SACROCOLPOPEXY N/A 12/31/2020   Procedure: ABDOMINO SACROCOLPOPEXY;  Surgeon: Marilynne Rosaline SAILOR, MD;  Location: [facility];  Service: Gynecology;  Laterality: N/A;   ANTERIOR AND POSTERIOR REPAIR  2003   colonscopy  2017   polyps removed due now   CYSTOSCOPY N/A 12/31/2020   Procedure: CYSTOSCOPY;  Surgeon: Marilynne Rosaline SAILOR, MD;  Location[facility];  Service: Gynecology;  Laterality: N/A;   INCONTINENCE SURGERY  01/13/2001   ROBOTIC ASSISTED TOTAL HYSTERECTOMY N/A 12/31/2020   Procedure: XI ROBOTIC ASSISTED TOTAL HYSTERECTOMY with bilateral salpingectomy;  Surgeon: Marilynne Rosaline SAILOR, MD;  Location: [facility];  Service: Gynecology;  Laterality: N/A;   TONSILLECTOMY  11/15/2020   and adenoids removed due to constant infections at [facility] [place]    Family History  Problem Relation Age of Onset   Osteoporosis Mother    Hyperlipidemia Mother    Kidney disease Father    Heart attack Father    Hyperlipidemia Father    Hypertension Father    Alcohol abuse Father    Heart disease Father    Heart attack Brother 2   Hypertension Brother    Heart disease Brother 88       MI   Hyperlipidemia Brother    Crohn's disease Maternal Aunt    Heart disease  Maternal Grandmother    Heart failure Maternal Grandmother    Valvular heart disease Maternal Grandmother    Dementia Maternal Grandfather 80       early stages   Stroke Maternal Grandfather    Heart failure Maternal Grandfather    Heart attack Paternal Grandfather    Stroke Paternal Grandfather    Heart disease Paternal Grandfather    Alcohol abuse Paternal Grandfather    Hyperlipidemia Paternal Grandfather    Hypertension Paternal Grandfather    Colon cancer Neg Hx    Stomach cancer Neg Hx    Esophageal cancer Neg Hx    Pancreatic cancer Neg Hx    Rectal cancer Neg Hx     Social History   Socioeconomic History   Marital status: Married    Spouse name: Not on file   Number  of children: 2   Years of education: Not on file   Highest education level: Associate degree: occupational, scientist, product/process development, or vocational program  Occupational History   Not on file  Tobacco Use   Smoking status: Never   Smokeless tobacco: Never  Vaping Use   Vaping status: Never Used  Substance and Sexual Activity   Alcohol use: No    Alcohol/week: 0.0 standard drinks of alcohol    Comment: 0   Drug use: No   Sexual activity: Yes    Partners: Male    Birth control/protection: Pill  Other Topics Concern   Not on file  Social History Narrative   Lives with husband 2 children in a one story home.     Works as a teacher, adult education.     Education: college.   Social Drivers of Health   Tobacco Use: Low Risk (10/05/2023)   Patient History    Smoking Tobacco Use: Never    Smokeless Tobacco Use: Never    Passive Exposure: Not on file  Financial Resource Strain: Medium Risk (11/10/2022)   Overall Financial Resource Strain (CARDIA)    Difficulty of Paying Living Expenses: Somewhat hard  Food Insecurity: No Food Insecurity (11/10/2022)   Hunger Vital Sign    Worried About Running Out of Food in the Last Year: Never true    Ran Out of Food in the Last Year: Never true  Transportation Needs: No  Transportation Needs (11/10/2022)   PRAPARE - Administrator, Civil Service (Medical): No    Lack of Transportation (Non-Medical): No  Physical Activity: Sufficiently Active (11/10/2022)   Exercise Vital Sign    Days of Exercise per Week: 4 days    Minutes of Exercise per Session: 50 min  Stress: Stress Concern Present (11/10/2022)   [employer] of Occupational Health - Occupational Stress Questionnaire    Feeling of Stress : To some extent  Social Connections: Moderately Isolated (11/10/2022)   Social Connection and Isolation Panel    Frequency of Communication with Friends and Family: More than three times a week    Frequency of Social Gatherings with Friends and Family: More than three times a week    Attends Religious Services: Never    Database Administrator or Organizations: No    Attends Engineer, Structural: Not on file    Marital Status: Married  Catering Manager Violence: Not on file  Depression (PHQ2-9): Low Risk (05/05/2022)   Depression (PHQ2-9)    PHQ-2 Score: 0  Alcohol Screen: Low Risk (11/10/2022)   Alcohol Screen    Last Alcohol Screening Score (AUDIT): 1  Housing: Low Risk (11/10/2022)   Housing    Last Housing Risk Score: 0  Utilities: Not on file  Health Literacy: Not on file    Outpatient Medications Prior to Visit  Medication Sig Dispense Refill   acyclovir  (ZOVIRAX ) 200 MG capsule TAKE 1 CAPSULE(200 MG) BY MOUTH TWICE DAILY 180 capsule 0   albuterol  (VENTOLIN  HFA) 108 (90 Base) MCG/ACT inhaler Inhale 2 puffs into the lungs every 6 (six) hours as needed. 1 each 3   ALPRAZolam  (XANAX ) 0.5 MG tablet 1 tablet by mouth 30 minutes prior to flight. 4 tablet 0   amoxicillin -clavulanate (AUGMENTIN ) 875-125 MG tablet Take 1 tablet by mouth 2 (two) times daily. (Patient not taking: Reported on 10/05/2023) 20 tablet 0   amphetamine -dextroamphetamine  (ADDERALL) 20 MG tablet Take 1 tablet (20 mg total) by mouth 2 (two) times daily. 60 tablet 0  amphetamine -dextroamphetamine  (ADDERALL) 20 MG tablet Take 1 tablet (20 mg total) by mouth daily. October 2025 60 tablet 0   amphetamine -dextroamphetamine  (ADDERALL) 20 MG tablet Take 1 tablet (20 mg total) by mouth 2 (two) times daily. December 2025 60 tablet 0   budesonide -formoterol  (SYMBICORT ) 160-4.5 MCG/ACT inhaler TAKE 2 PUFFS BY MOUTH TWICE A DAY 10.2 each 5   cloNIDine  (CATAPRES  - DOSED IN MG/24 HR) 0.2 mg/24hr patch Place 1 patch (0.2 mg total) onto the skin once a week. 4 patch 12   cloNIDine  (CATAPRES ) 0.1 MG tablet Take 2 tablets (0.2 mg total) by mouth 3 (three) times daily. 180 tablet 5   Cyanocobalamin  (VITAMIN B 12 PO) Take by mouth. Vitamin b 12 subligual daily (Patient not taking: Reported on 10/05/2023)     cyclobenzaprine  (FLEXERIL ) 10 MG tablet Take 1 tablet (10 mg total) by mouth 3 (three) times daily as needed for muscle spasms. 30 tablet 2   dicyclomine  (BENTYL ) 10 MG capsule Take 1 capsule (10 mg total) by mouth every 6 (six) hours as needed (abdominal pain). 90 capsule 0   EPINEPHrine  0.3 mg/0.3 mL IJ SOAJ injection Inject 0.3 mg into the muscle as needed for anaphylaxis. 2 each 2   estradiol  (VIVELLE -DOT) 0.0375 MG/24HR APPLY 1 PATCH TOPICALLY TO THE SKIN 2 TIMES A WEEK 8 patch 5   fluconazole  (DIFLUCAN ) 150 MG tablet Take 1 tablet (150 mg total) by mouth once a week. Takes prn (Patient not taking: Reported on 10/05/2023) 2 tablet 2   HYDROcodone -acetaminophen  (NORCO/VICODIN) 5-325 MG tablet Take 1 tablet by mouth every 6 (six) hours as needed for moderate pain (pain score 4-6). 90 tablet 0   hydrOXYzine  (ATARAX ) 50 MG tablet Take 1 tablet (50 mg total) by mouth every 6 (six) hours as needed for anxiety. 5 tablet 0   hyoscyamine  (LEVSIN  SL) 0.125 MG SL tablet Take 0.125 mg by mouth as needed.     lidocaine  (XYLOCAINE ) 2 % jelly Apply 1 Application topically 2 (two) times daily as needed. 20 mL 3   liothyronine  (CYTOMEL ) 5 MCG tablet TAKE 1 TABLET BY MOUTH EVERY DAY  (Patient not taking: Reported on 10/05/2023) 90 tablet 1   MAGNESIUM PO Take by mouth. Otc daily     meloxicam  (MOBIC ) 15 MG tablet Take 1 tablet (15 mg total) by mouth daily. 30 tablet 0   Multiple Vitamins-Minerals (MULTIVITAMIN WITH MINERALS) tablet Take 1 tablet by mouth daily.     Naltrexone  HCl, Pain, 1.5 MG CAPS Take 2-3 tablets by mouth daily. (Patient not taking: Reported on 10/05/2023) 90 capsule 3   nebivolol  (BYSTOLIC ) 5 MG tablet 1 tablet by mouth once daily as needed for systolic blood pressure >160. 30 tablet 3   neomycin -polymyxin-hydrocortisone (CORTISPORIN) 3.5-10000-1 ophthalmic suspension SHAKE LIQUID AND INSTILL 3 DROPS IN RIGHT EYE FOUR TIMES DAILY 7.5 mL 0   nitrofurantoin , macrocrystal-monohydrate, (MACROBID ) 100 MG capsule TAKE 1 CAPSULE FOR 1 DOSE AS NEEDED FOR PREVENTION OF UTI *AFTER SELF CATH, SWIMMING, HOT TUB, SEX* 30 capsule 8   ondansetron  (ZOFRAN ) 4 MG tablet Take 1 tablet (4 mg total) by mouth every 8 (eight) hours as needed for nausea or vomiting. 10 tablet 0   predniSONE  (DELTASONE ) 10 MG tablet 4 tabs by mouth once daily for 2 days, then 3 tabs daily x 2 days, then 2 tabs daily x 2 days, then 1 tab daily x 2 days (Patient not taking: Reported on 10/05/2023) 20 tablet 0   SEMAGLUTIDE [place] Inject into the skin. USING THE  COMPOUND     SYNTHROID  137 MCG tablet TAKE 1 TABLET(137 MCG) BY MOUTH DAILY 90 tablet 1   tobramycin -dexamethasone  (TOBRADEX ) ophthalmic solution Place 1 drop into both eyes every 4 (four) hours while awake. 5 mL 0   No facility-administered medications prior to visit.    Allergies[1]  Review of Systems  Constitutional:  Positive for malaise/fatigue. Negative for chills and fever.  HENT:  Negative for congestion and hearing loss.   Eyes:  Negative for discharge.  Respiratory:  Negative for cough, sputum production and shortness of breath.   Cardiovascular:  Negative for chest pain, palpitations and leg swelling.  Gastrointestinal:  Positive for  abdominal pain. Negative for blood in stool, constipation, diarrhea, heartburn, nausea and vomiting.  Genitourinary:  Positive for frequency. Negative for dysuria, hematuria and urgency.  Musculoskeletal:  Positive for back pain and neck pain. Negative for falls and myalgias.  Skin:  Negative for rash.  Neurological:  Negative for dizziness, sensory change, loss of consciousness, weakness and headaches.  Endo/Heme/Allergies:  Negative for environmental allergies. Does not bruise/bleed easily.  Psychiatric/Behavioral:  Positive for depression. Negative for substance abuse and suicidal ideas. The patient is nervous/anxious and has insomnia.        Objective:    Physical Exam Constitutional:      General: She is not in acute distress.    Appearance: Normal appearance. She is not diaphoretic.  HENT:     Head: Normocephalic and atraumatic.     Right Ear: Tympanic membrane, ear canal and external ear normal.     Left Ear: Tympanic membrane, ear canal and external ear normal.     Nose: Nose normal.     Mouth/Throat:     Mouth: Mucous membranes are moist.     Pharynx: Oropharynx is clear. No oropharyngeal exudate.  Eyes:     General: No scleral icterus.       Right eye: No discharge.        Left eye: No discharge.     Conjunctiva/sclera: Conjunctivae normal.     Pupils: Pupils are equal, round, and reactive to light.  Neck:     Thyroid : No thyromegaly.  Cardiovascular:     Rate and Rhythm: Normal rate and regular rhythm.     Heart sounds: Normal heart sounds. No murmur heard. Pulmonary:     Effort: Pulmonary effort is normal. No respiratory distress.     Breath sounds: Normal breath sounds. No wheezing or rales.  Abdominal:     General: Bowel sounds are normal. There is no distension.     Palpations: Abdomen is soft. There is no mass.     Tenderness: There is no abdominal tenderness.  Musculoskeletal:        General: No tenderness. Normal range of motion.     Cervical back: Normal  range of motion and neck supple.  Lymphadenopathy:     Cervical: No cervical adenopathy.  Skin:    General: Skin is warm and dry.     Findings: No rash.  Neurological:     General: No focal deficit present.     Mental Status: She is alert and oriented to person, place, and time.     Cranial Nerves: No cranial nerve deficit.     Coordination: Coordination normal.     Deep Tendon Reflexes: Reflexes are normal and symmetric. Reflexes normal.  Psychiatric:        Mood and Affect: Mood normal.        Behavior: Behavior normal.  Thought Content: Thought content normal.        Judgment: Judgment normal.    There were no vitals taken for this visit. Wt Readings from Last 3 Encounters:  10/05/23 129 lb (58.5 kg)  03/16/23 133 lb 12.8 oz (60.7 kg)  11/10/22 141 lb (64 kg)    Diabetic Foot Exam - Simple   No data filed    Lab Results  Component Value Date   WBC 8.4 03/16/2023   HGB 14.2 03/16/2023   HCT 43.0 03/16/2023   PLT 193.0 03/16/2023   GLUCOSE 92 03/16/2023   CHOL 210 (H) 03/16/2023   TRIG 187.0 (H) 03/16/2023   HDL 44.50 03/16/2023   LDLDIRECT 144.0 05/05/2022   LDLCALC 129 (H) 03/16/2023   ALT 17 03/16/2023   AST 17 03/16/2023   NA 138 03/16/2023   K 4.1 03/16/2023   CL 104 03/16/2023   CREATININE 0.77 03/16/2023   BUN 17 03/16/2023   CO2 28 03/16/2023   TSH 1.37 03/16/2023   INR 1.09 05/23/2015   HGBA1C 5.5 03/16/2023    Lab Results  Component Value Date   TSH 1.37 03/16/2023   Lab Results  Component Value Date   WBC 8.4 03/16/2023   HGB 14.2 03/16/2023   HCT 43.0 03/16/2023   MCV 94.1 03/16/2023   PLT 193.0 03/16/2023   Lab Results  Component Value Date   NA 138 03/16/2023   K 4.1 03/16/2023   CO2 28 03/16/2023   GLUCOSE 92 03/16/2023   BUN 17 03/16/2023   CREATININE 0.77 03/16/2023   BILITOT 0.7 03/16/2023   ALKPHOS 66 03/16/2023   AST 17 03/16/2023   ALT 17 03/16/2023   PROT 6.4 03/16/2023   ALBUMIN 4.2 03/16/2023   CALCIUM 9.1  03/16/2023   ANIONGAP 8 12/27/2020   EGFR 96 10/16/2022   GFR 91.98 03/16/2023   Lab Results  Component Value Date   CHOL 210 (H) 03/16/2023   Lab Results  Component Value Date   HDL 44.50 03/16/2023   Lab Results  Component Value Date   LDLCALC 129 (H) 03/16/2023   Lab Results  Component Value Date   TRIG 187.0 (H) 03/16/2023   Lab Results  Component Value Date   CHOLHDL 5 03/16/2023   Lab Results  Component Value Date   HGBA1C 5.5 03/16/2023       Assessment & Plan:  Vitamin D  deficiency Assessment & Plan: Supplement and monitor    Vitamin B12 deficiency Assessment & Plan: Supplement and monitor    Preventative health care Assessment & Plan: Patient encouraged to maintain heart healthy diet, regular exercise, adequate sleep. Consider daily probiotics. Take medications as prescribed. Labs ordered and reviewed.  Follows with GYN, [facility], for paps and MGMs. Will request records. Last [facility] 12/2022 repeat every 1-2 years.  Colonoscopy 12/2021 repeat in 10 years.  She declines vaccinations.    Hypothyroidism, unspecified type Assessment & Plan: On Levothyroxine  and Cytomel , continue to monitor   Mixed hyperlipidemia Assessment & Plan: Encourage heart healthy diet such as MIND or DASH diet, increase exercise, avoid trans fats, simple carbohydrates and processed foods, consider a krill or fish or flaxseed oil cap daily.    Hyperglycemia Assessment & Plan: hgba1c acceptable, minimize simple carbs. Increase exercise as tolerated.    Essential hypertension Assessment & Plan: Well controlled, no changes to meds. Encouraged heart healthy diet such as the DASH diet and exercise as tolerated.     Mild intermittent asthma without complication Assessment &  Plan: No recent exacerbation     Assessment and Plan Assessment & Plan Adjustment disorder with depressed mood Experiencing significant emotional distress following the suicide of a  close friend, leading to adjustment disorder with depressed mood. Reports periods of severe emotional distress, including a week where she did not get out of bed. Counseling has been helpful, but she is considering medication if symptoms do not improve. Discussed the potential for PTSD-like symptoms and the importance of early intervention to prevent hardwiring of stress responses. - Continue counseling sessions. - Will consider medication if symptoms do not improve by March. - Encouraged proactive self-care on difficult days.  Menopausal symptoms Reports recurrence of hot flashes and sleep disturbances, likely related to menopausal symptoms. Previous treatment with estradiol  patch was effective in improving mood and emotional well-being. - Increased estradiol  patch to 0.05 mg twice a week.  Pelvic organ prolapse Reports recurrence of prolapse symptoms. Previous surgical interventions have been performed, but she is not currently pursuing further surgery due to intolerance of another procedure. Plans to consult a surgeon in [place]  next year. - Continue to monitor prolapse symptoms. - Plan for potential surgical consultation next year.  Genital herpes simplex infection Experiences outbreaks approximately twice a year. Currently on daily acyclovir  200 mg for suppression. Discussed the use of valacyclovir  during outbreaks for convenience. - Prescribed valacyclovir  1000 mg twice daily during outbreaks.  Vulvovaginal symptoms, rule out infection Reports tender mound off to the left side of the labia and inguinal lymph node tenderness. Differential includes infection or prolapse-related symptoms. No recent gynecological check-up. - Performed pelvic examination and swab for infection. - Ordered STD screening.  Interstitial cystitis Experiences bladder spasms and pelvic pain, managed with hydrocodone  for pain relief. Hydrocodone  is also used for neck and back pain. - Continue hydrocodone  for  bladder pain and neck/back pain as needed.  Chronic neck and back pain Managed with hydrocodone  as needed. Pain exacerbated by prolonged sitting or travel. - Continue hydrocodone  for neck and back pain as needed.  Essential hypertension Blood pressure has been variable, with some good readings and some poor. Currently under the care of cardiology for management. - Continue monitoring blood pressure. - Continue coordination with cardiology for hypertension management.  Recording duration: 26 minutes     Harlene Horton, MD    [1]  Allergies Allergen Reactions   Chocolate Anaphylaxis    Trouble breathing facial swelling   Peanut-Containing Drug Products Anaphylaxis    All nuts of every kind   Clindamycin/Lincomycin Nausea And Vomiting    And diarrhea.    Estradiol  Nausea Only    Anxiety, hot flashes   Influenza Vaccines Rash    Significant swelling in arm, rash, malaise, myalgias    Bystolic  [Nebivolol  Hcl]     Fatigue arm heaviness   Doxazosin      Would not take due to side effects listed in patient information   Enalapril      Swelling at higher doses fatigue   Hydralazine      Urinary retention and edema    Hydrochlorothiazide     Bladder issues and kidney stones   Losartan      Did not work   Spiractone [Spironolactone ]     Leg cramps   Terazol [Terconazole]     Burning sensation   Latex Rash   "

## 2024-01-17 NOTE — Assessment & Plan Note (Signed)
 Well controlled, no changes to meds. Encouraged heart healthy diet such as the DASH diet and exercise as tolerated.

## 2024-01-21 ENCOUNTER — Ambulatory Visit: Payer: Self-pay | Admitting: Family Medicine

## 2024-01-21 ENCOUNTER — Other Ambulatory Visit (HOSPITAL_COMMUNITY)
Admission: RE | Admit: 2024-01-21 | Discharge: 2024-01-21 | Disposition: A | Source: Ambulatory Visit | Attending: Family Medicine | Admitting: Family Medicine

## 2024-01-21 ENCOUNTER — Encounter: Payer: Self-pay | Admitting: Family Medicine

## 2024-01-21 VITALS — BP 132/80 | HR 78 | Temp 98.1°F | Resp 16 | Ht 62.0 in | Wt 136.4 lb

## 2024-01-21 DIAGNOSIS — N390 Urinary tract infection, site not specified: Secondary | ICD-10-CM | POA: Diagnosis not present

## 2024-01-21 DIAGNOSIS — E559 Vitamin D deficiency, unspecified: Secondary | ICD-10-CM

## 2024-01-21 DIAGNOSIS — R0789 Other chest pain: Secondary | ICD-10-CM

## 2024-01-21 DIAGNOSIS — E782 Mixed hyperlipidemia: Secondary | ICD-10-CM

## 2024-01-21 DIAGNOSIS — E538 Deficiency of other specified B group vitamins: Secondary | ICD-10-CM | POA: Diagnosis not present

## 2024-01-21 DIAGNOSIS — R739 Hyperglycemia, unspecified: Secondary | ICD-10-CM

## 2024-01-21 DIAGNOSIS — Z0000 Encounter for general adult medical examination without abnormal findings: Secondary | ICD-10-CM

## 2024-01-21 DIAGNOSIS — J4520 Mild intermittent asthma, uncomplicated: Secondary | ICD-10-CM | POA: Diagnosis not present

## 2024-01-21 DIAGNOSIS — Z209 Contact with and (suspected) exposure to unspecified communicable disease: Secondary | ICD-10-CM

## 2024-01-21 DIAGNOSIS — E039 Hypothyroidism, unspecified: Secondary | ICD-10-CM | POA: Diagnosis not present

## 2024-01-21 DIAGNOSIS — I10 Essential (primary) hypertension: Secondary | ICD-10-CM | POA: Diagnosis not present

## 2024-01-21 DIAGNOSIS — Z7251 High risk heterosexual behavior: Secondary | ICD-10-CM | POA: Diagnosis present

## 2024-01-21 LAB — CBC WITH DIFFERENTIAL/PLATELET
Basophils Absolute: 0 K/uL (ref 0.0–0.1)
Basophils Relative: 0.5 % (ref 0.0–3.0)
Eosinophils Absolute: 0.2 K/uL (ref 0.0–0.7)
Eosinophils Relative: 2.1 % (ref 0.0–5.0)
HCT: 41.5 % (ref 36.0–46.0)
Hemoglobin: 13.9 g/dL (ref 12.0–15.0)
Lymphocytes Relative: 38.3 % (ref 12.0–46.0)
Lymphs Abs: 2.8 K/uL (ref 0.7–4.0)
MCHC: 33.5 g/dL (ref 30.0–36.0)
MCV: 92.3 fl (ref 78.0–100.0)
Monocytes Absolute: 0.5 K/uL (ref 0.1–1.0)
Monocytes Relative: 7.1 % (ref 3.0–12.0)
Neutro Abs: 3.7 K/uL (ref 1.4–7.7)
Neutrophils Relative %: 52 % (ref 43.0–77.0)
Platelets: 160 K/uL (ref 150.0–400.0)
RBC: 4.5 Mil/uL (ref 3.87–5.11)
RDW: 13.2 % (ref 11.5–15.5)
WBC: 7.2 K/uL (ref 4.0–10.5)

## 2024-01-21 LAB — T4, FREE: Free T4: 1.39 ng/dL (ref 0.60–1.60)

## 2024-01-21 LAB — URINALYSIS
Bilirubin Urine: NEGATIVE
Hgb urine dipstick: NEGATIVE
Ketones, ur: NEGATIVE
Leukocytes,Ua: NEGATIVE
Nitrite: NEGATIVE
Specific Gravity, Urine: 1.01 (ref 1.000–1.030)
Total Protein, Urine: NEGATIVE
Urine Glucose: NEGATIVE
Urobilinogen, UA: 0.2 (ref 0.0–1.0)
pH: 7.5 (ref 5.0–8.0)

## 2024-01-21 LAB — HEMOGLOBIN A1C: Hgb A1c MFr Bld: 5.5 % (ref 4.6–6.5)

## 2024-01-21 LAB — COMPREHENSIVE METABOLIC PANEL WITH GFR
ALT: 14 U/L (ref 3–35)
AST: 17 U/L (ref 5–37)
Albumin: 4.1 g/dL (ref 3.5–5.2)
Alkaline Phosphatase: 53 U/L (ref 39–117)
BUN: 12 mg/dL (ref 6–23)
CO2: 31 meq/L (ref 19–32)
Calcium: 9 mg/dL (ref 8.4–10.5)
Chloride: 102 meq/L (ref 96–112)
Creatinine, Ser: 0.66 mg/dL (ref 0.40–1.20)
GFR: 103.98 mL/min
Glucose, Bld: 79 mg/dL (ref 70–99)
Potassium: 4.3 meq/L (ref 3.5–5.1)
Sodium: 137 meq/L (ref 135–145)
Total Bilirubin: 0.5 mg/dL (ref 0.2–1.2)
Total Protein: 6 g/dL (ref 6.0–8.3)

## 2024-01-21 LAB — LIPID PANEL
Cholesterol: 202 mg/dL — ABNORMAL HIGH (ref 28–200)
HDL: 44.2 mg/dL
LDL Cholesterol: 130 mg/dL — ABNORMAL HIGH (ref 10–99)
NonHDL: 157.65
Total CHOL/HDL Ratio: 5
Triglycerides: 139 mg/dL (ref 10.0–149.0)
VLDL: 27.8 mg/dL (ref 0.0–40.0)

## 2024-01-21 LAB — T3, FREE: T3, Free: 3.5 pg/mL (ref 2.3–4.2)

## 2024-01-21 LAB — TSH: TSH: 0.77 u[IU]/mL (ref 0.35–5.50)

## 2024-01-21 LAB — SEDIMENTATION RATE: Sed Rate: 1 mm/h (ref 0–20)

## 2024-01-21 LAB — VITAMIN D 25 HYDROXY (VIT D DEFICIENCY, FRACTURES): VITD: 32.31 ng/mL (ref 30.00–100.00)

## 2024-01-21 LAB — HIGH SENSITIVITY CRP: CRP, High Sensitivity: 0.86 mg/L (ref 0.200–5.000)

## 2024-01-21 MED ORDER — ESTRADIOL 0.05 MG/24HR TD PTTW: 1.0000 | MEDICATED_PATCH | TRANSDERMAL | 5 refills | Status: AC

## 2024-01-21 MED ORDER — VALACYCLOVIR HCL 1 G PO TABS: 1000.0000 mg | ORAL_TABLET | Freq: Two times a day (BID) | ORAL | 2 refills | Status: AC

## 2024-01-21 MED ORDER — HYDROCODONE-ACETAMINOPHEN 5-325 MG PO TABS: 1.0000 | ORAL_TABLET | Freq: Four times a day (QID) | ORAL | 0 refills | Status: AC | PRN

## 2024-01-21 MED ORDER — FLUCONAZOLE 150 MG PO TABS: 150.0000 mg | ORAL_TABLET | ORAL | 2 refills | Status: AC

## 2024-01-21 NOTE — Patient Instructions (Signed)
 Preventive Care 58-49 Years Old, Female  Preventive care refers to lifestyle choices and visits with your health care provider that can promote health and wellness. Preventive care visits are also called wellness exams.  What can I expect for my preventive care visit?  Counseling  Your health care provider may ask you questions about your:  Medical history, including:  Past medical problems.  Family medical history.  Pregnancy history.  Current health, including:  Menstrual cycle.  Method of birth control.  Emotional well-being.  Home life and relationship well-being.  Sexual activity and sexual health.  Lifestyle, including:  Alcohol, nicotine or tobacco, and drug use.  Access to firearms.  Diet, exercise, and sleep habits.  Work and work Astron[employer].  Sunscreen use.  Safety issues such as seatbelt and bike helmet use.  Physical exam  Your health care provider will check your:  Height and weight. These may be used to calculate your BMI (body mass index). BMI is a measurement that tells if you are at a healthy weight.  Waist circumference. This measures the distance around your waistline. This measurement also tells if you are at a healthy weight and may help predict your risk of certain diseases, such as type 2 diabetes and high blood pressure.  Heart rate and blood pressure.  Body temperature.  Skin for abnormal spots.  What immunizations do I need?    Vaccines are usually given at various ages, according to a schedule. Your health care provider will recommend vaccines for you based on your age, medical history, and lifestyle or other factors, such as travel or where you work.  What tests do I need?  Screening  Your health care provider may recommend screening tests for certain conditions. This may include:  Lipid and cholesterol levels.  Diabetes screening. This is done by checking your blood sugar (glucose) after you have not eaten for a while (fasting).  Pelvic exam and Pap test.  Hepatitis B test.  Hepatitis C  test.  HIV (human immunodeficiency virus) test.  STI (sexually transmitted infection) testing, if you are at risk.  Lung cancer screening.  Colorectal cancer screening.  Mammogram. Talk with your health care provider about when you should start having regular mammograms. This may depend on whether you have a family history of breast cancer.  BRCA-related cancer screening. This may be done if you have a family history of breast, ovarian, tubal, or peritoneal cancers.  Bone density scan. This is done to screen for osteoporosis.  Talk with your health care provider about your test results, treatment options, and if necessary, the need for more tests.  Follow these instructions at home:  Eating and drinking    Eat a diet that includes fresh fruits and vegetables, whole grains, lean protein, and low-fat dairy products.  Take vitamin and mineral supplements as recommended by your health care provider.  Do not drink alcohol if:  Your health care provider tells you not to drink.  You are pregnant, may be pregnant, or are planning to become pregnant.  If you drink alcohol:  Limit how much you have to 0-1 drink a day.  Know how much alcohol is in your drink. In the [place] one drink equals one 12 oz bottle of beer (355 mL), one 5 oz glass of wine (148 mL), or one 1 oz glass of hard liquor (44 mL).  Lifestyle  Brush your teeth every morning and night with fluoride toothpaste. Floss one time each day.  Exercise for at least  30 minutes 5 or more days each week.  Do not use any products that contain nicotine or tobacco. These products include cigarettes, chewing tobacco, and vaping devices, such as e-cigarettes. If you need help quitting, ask your health care provider.  Do not use drugs.  If you are sexually active, practice safe sex. Use a condom or other form of protection to prevent STIs.  If you do not wish to become pregnant, use a form of birth control. If you plan to become pregnant, see your health care provider for a  prepregnancy visit.  Take aspirin only as told by your health care provider. Make sure that you understand how much to take and what form to take. Work with your health care provider to find out whether it is safe and beneficial for you to take aspirin daily.  Find healthy ways to manage stress, such as:  Meditation, yoga, or listening to music.  Journaling.  Talking to a trusted person.  Spending time with friends and family.  Minimize exposure to UV radiation to reduce your risk of skin cancer.  Safety  Always wear your seat belt while driving or riding in a vehicle.  Do not drive:  If you have been drinking alcohol. Do not ride with someone who has been drinking.  When you are tired or distracted.  While texting.  If you have been using any mind-altering substances or drugs.  Wear a helmet and other protective equipment during sports activities.  If you have firearms in your house, make sure you follow all gun safety procedures.  Seek help if you have been physically or sexually abused.  What's next?  Visit your health care provider once a year for an annual wellness visit.  Ask your health care provider how often you should have your eyes and teeth checked.  Stay up to date on all vaccines.  This information is not intended to replace advice given to you by your health care provider. Make sure you discuss any questions you have with your health care provider.  Document Revised: 06/27/2020 Document Reviewed: 06/27/2020  Elsevier Patient Education  2024 [employer].

## 2024-01-22 LAB — CERVICOVAGINAL ANCILLARY ONLY
Bacterial Vaginitis (gardnerella): NEGATIVE
Candida Glabrata: NEGATIVE
Candida Vaginitis: NEGATIVE
Chlamydia: NEGATIVE
Comment: NEGATIVE
Comment: NEGATIVE
Comment: NEGATIVE
Comment: NEGATIVE
Comment: NEGATIVE
Comment: NORMAL
Neisseria Gonorrhea: NEGATIVE
Trichomonas: NEGATIVE

## 2024-01-22 NOTE — Progress Notes (Signed)
"  Pt reviewed via MyChart.   "

## 2024-01-24 ENCOUNTER — Encounter: Payer: Self-pay | Admitting: Family Medicine

## 2024-01-25 LAB — HIV ANTIBODY (ROUTINE TESTING W REFLEX)
HIV 1&2 Ab, 4th Generation: NONREACTIVE
HIV FINAL INTERPRETATION: NEGATIVE

## 2024-01-25 LAB — SYPHILIS: RPR W/REFLEX TO RPR TITER AND TREPONEMAL ANTIBODIES, TRADITIONAL SCREENING AND DIAGNOSIS ALGORITHM: RPR Ser Ql: NONREACTIVE

## 2024-01-25 LAB — THYROID PEROXIDASE ANTIBODY: Thyroperoxidase Ab SerPl-aCnc: 100 [IU]/mL — ABNORMAL HIGH

## 2024-01-27 ENCOUNTER — Telehealth: Payer: Self-pay

## 2024-01-27 NOTE — Telephone Encounter (Signed)
 PA initiated via Covermymeds; KEY:  [id number]. Awaiting determination.

## 2024-01-28 NOTE — Telephone Encounter (Signed)
 PA approved.   Approved. ESTRADIOL  0.05MG /24HR Patch TW is approved from 01/27/2024 to 01/26/2025. All strengths of the drug are approved. Effective Date: 01/27/2024 Authorization Expiration Date: 01/26/2025

## 2024-02-12 ENCOUNTER — Other Ambulatory Visit: Payer: Self-pay | Admitting: Family Medicine

## 2024-02-12 ENCOUNTER — Encounter: Payer: Self-pay | Admitting: Family Medicine

## 2024-02-12 ENCOUNTER — Other Ambulatory Visit: Payer: Self-pay | Admitting: Family

## 2024-02-12 MED ORDER — AMPHETAMINE-DEXTROAMPHETAMINE 20 MG PO TABS: 20.0000 mg | ORAL_TABLET | Freq: Two times a day (BID) | ORAL | 0 refills | Status: AC

## 2024-02-12 MED ORDER — AMPHETAMINE-DEXTROAMPHETAMINE 20 MG PO TABS: 20.0000 mg | ORAL_TABLET | Freq: Two times a day (BID) | ORAL | 0 refills | Status: DC

## 2024-02-12 MED ORDER — AMPHETAMINE-DEXTROAMPHETAMINE 20 MG PO TABS: 20.0000 mg | ORAL_TABLET | Freq: Every day | ORAL | 0 refills | Status: AC

## 2024-02-12 NOTE — Telephone Encounter (Signed)
 Requesting: Adderall 20 MG Contract: 02/03/2022 UDS: 02/03/2022 Last Visit: 01/21/2024 Next Visit: 07/18/2024 Last Refill: 01/12/24  Please Advise

## 2024-02-17 ENCOUNTER — Other Ambulatory Visit: Payer: Self-pay | Admitting: Family Medicine

## 2024-02-18 ENCOUNTER — Other Ambulatory Visit (HOSPITAL_COMMUNITY): Payer: Self-pay

## 2024-02-18 ENCOUNTER — Telehealth: Payer: Self-pay

## 2024-02-18 NOTE — Telephone Encounter (Signed)
 Pharmacy Patient Advocate Encounter   Received notification from [facility] KEY that prior authorization for {MAmphetamine-Dextroamphetamine  20MG  tablets  is required/requested.   Insurance verification completed.   The patient is insured through [facility] [employer].   Per test claim: PA required; PA submitted to above mentioned insurance via Latent Key/confirmation #/[id number] Status is pending

## 2024-07-18 ENCOUNTER — Ambulatory Visit: Admitting: Family Medicine

## 2024-07-25 ENCOUNTER — Ambulatory Visit: Payer: Self-pay | Admitting: Family Medicine
